# Patient Record
Sex: Male | Born: 1970 | Race: Black or African American | Hispanic: No | Marital: Single | Smoking: Never smoker
Health system: Southern US, Community
[De-identification: ages and names within clinical notes are randomized; demographics above are authoritative.]

## PROBLEM LIST (undated history)

## (undated) DIAGNOSIS — I719 Aortic aneurysm of unspecified site, without rupture: Secondary | ICD-10-CM

## (undated) DIAGNOSIS — Z952 Presence of prosthetic heart valve: Secondary | ICD-10-CM

## (undated) DIAGNOSIS — K219 Gastro-esophageal reflux disease without esophagitis: Secondary | ICD-10-CM

## (undated) DIAGNOSIS — I499 Cardiac arrhythmia, unspecified: Secondary | ICD-10-CM

## (undated) DIAGNOSIS — I509 Heart failure, unspecified: Secondary | ICD-10-CM

## (undated) DIAGNOSIS — G4730 Sleep apnea, unspecified: Secondary | ICD-10-CM

## (undated) DIAGNOSIS — K5792 Diverticulitis of intestine, part unspecified, without perforation or abscess without bleeding: Secondary | ICD-10-CM

## (undated) DIAGNOSIS — I10 Essential (primary) hypertension: Secondary | ICD-10-CM

## (undated) DIAGNOSIS — M1990 Unspecified osteoarthritis, unspecified site: Secondary | ICD-10-CM

## (undated) DIAGNOSIS — N189 Chronic kidney disease, unspecified: Secondary | ICD-10-CM

## (undated) DIAGNOSIS — Z87442 Personal history of urinary calculi: Secondary | ICD-10-CM

## (undated) DIAGNOSIS — F32A Depression, unspecified: Secondary | ICD-10-CM

## (undated) DIAGNOSIS — F419 Anxiety disorder, unspecified: Secondary | ICD-10-CM

## (undated) HISTORY — DX: Chronic kidney disease, unspecified: N18.9

## (undated) HISTORY — DX: Aortic aneurysm of unspecified site, without rupture: I71.9

## (undated) HISTORY — DX: Gastro-esophageal reflux disease without esophagitis: K21.9

## (undated) HISTORY — PX: FEMORAL BYPASS: SHX50

## (undated) HISTORY — DX: Heart failure, unspecified: I50.9

## (undated) HISTORY — PX: SPLENECTOMY, TOTAL: SHX788

---

## 1998-04-04 ENCOUNTER — Emergency Department (HOSPITAL_COMMUNITY): Admission: EM | Admit: 1998-04-04 | Discharge: 1998-04-04 | Payer: Self-pay | Admitting: Emergency Medicine

## 1998-04-19 ENCOUNTER — Inpatient Hospital Stay (HOSPITAL_COMMUNITY): Admission: EM | Admit: 1998-04-19 | Discharge: 1998-04-26 | Payer: Self-pay | Admitting: Emergency Medicine

## 1998-04-26 ENCOUNTER — Inpatient Hospital Stay (HOSPITAL_COMMUNITY)
Admission: RE | Admit: 1998-04-26 | Discharge: 1998-05-04 | Payer: Self-pay | Admitting: Physical Medicine & Rehabilitation

## 1998-06-20 ENCOUNTER — Encounter: Admission: RE | Admit: 1998-06-20 | Discharge: 1998-09-18 | Payer: Self-pay

## 1998-09-23 ENCOUNTER — Encounter: Admission: RE | Admit: 1998-09-23 | Discharge: 1998-11-18 | Payer: Self-pay

## 1998-11-12 ENCOUNTER — Emergency Department (HOSPITAL_COMMUNITY): Admission: EM | Admit: 1998-11-12 | Discharge: 1998-11-12 | Payer: Self-pay | Admitting: Emergency Medicine

## 1998-11-16 ENCOUNTER — Emergency Department (HOSPITAL_COMMUNITY): Admission: EM | Admit: 1998-11-16 | Discharge: 1998-11-16 | Payer: Self-pay | Admitting: Emergency Medicine

## 1999-02-04 ENCOUNTER — Encounter: Admission: RE | Admit: 1999-02-04 | Discharge: 1999-04-02 | Payer: Self-pay

## 1999-09-19 ENCOUNTER — Encounter: Admission: RE | Admit: 1999-09-19 | Discharge: 1999-09-19 | Payer: Self-pay | Admitting: Internal Medicine

## 1999-10-24 ENCOUNTER — Encounter: Admission: RE | Admit: 1999-10-24 | Discharge: 1999-10-24 | Payer: Self-pay | Admitting: Internal Medicine

## 2000-05-17 ENCOUNTER — Encounter: Admission: RE | Admit: 2000-05-17 | Discharge: 2000-05-17 | Payer: Self-pay | Admitting: Internal Medicine

## 2000-09-03 ENCOUNTER — Encounter: Admission: RE | Admit: 2000-09-03 | Discharge: 2000-09-03 | Payer: Self-pay | Admitting: Internal Medicine

## 2001-03-10 ENCOUNTER — Emergency Department (HOSPITAL_COMMUNITY): Admission: EM | Admit: 2001-03-10 | Discharge: 2001-03-10 | Payer: Self-pay | Admitting: Emergency Medicine

## 2001-08-08 ENCOUNTER — Encounter: Admission: RE | Admit: 2001-08-08 | Discharge: 2001-08-08 | Payer: Self-pay

## 2001-10-22 ENCOUNTER — Emergency Department (HOSPITAL_COMMUNITY): Admission: EM | Admit: 2001-10-22 | Discharge: 2001-10-23 | Payer: Self-pay | Admitting: Emergency Medicine

## 2001-10-23 ENCOUNTER — Encounter: Payer: Self-pay | Admitting: Emergency Medicine

## 2002-01-21 ENCOUNTER — Emergency Department (HOSPITAL_COMMUNITY): Admission: EM | Admit: 2002-01-21 | Discharge: 2002-01-21 | Payer: Self-pay | Admitting: Emergency Medicine

## 2002-08-16 ENCOUNTER — Encounter: Admission: RE | Admit: 2002-08-16 | Discharge: 2002-08-16 | Payer: Self-pay | Admitting: Internal Medicine

## 2002-09-11 ENCOUNTER — Encounter: Admission: RE | Admit: 2002-09-11 | Discharge: 2002-09-11 | Payer: Self-pay | Admitting: Internal Medicine

## 2003-02-05 ENCOUNTER — Encounter: Admission: RE | Admit: 2003-02-05 | Discharge: 2003-02-05 | Payer: Self-pay | Admitting: Internal Medicine

## 2003-02-13 ENCOUNTER — Encounter: Admission: RE | Admit: 2003-02-13 | Discharge: 2003-04-03 | Payer: Self-pay | Admitting: Infectious Diseases

## 2003-06-26 ENCOUNTER — Encounter: Admission: RE | Admit: 2003-06-26 | Discharge: 2003-06-26 | Payer: Self-pay | Admitting: Internal Medicine

## 2003-07-03 ENCOUNTER — Encounter: Admission: RE | Admit: 2003-07-03 | Discharge: 2003-07-03 | Payer: Self-pay | Admitting: Internal Medicine

## 2003-08-03 ENCOUNTER — Encounter
Admission: RE | Admit: 2003-08-03 | Discharge: 2003-11-01 | Payer: Self-pay | Admitting: Physical Medicine & Rehabilitation

## 2003-08-03 ENCOUNTER — Encounter: Admission: RE | Admit: 2003-08-03 | Discharge: 2003-08-03 | Payer: Self-pay | Admitting: Internal Medicine

## 2003-08-12 ENCOUNTER — Ambulatory Visit (HOSPITAL_COMMUNITY)
Admission: RE | Admit: 2003-08-12 | Discharge: 2003-08-12 | Payer: Self-pay | Admitting: Physical Medicine & Rehabilitation

## 2003-08-12 ENCOUNTER — Encounter: Payer: Self-pay | Admitting: Physical Medicine & Rehabilitation

## 2003-12-18 ENCOUNTER — Encounter
Admission: RE | Admit: 2003-12-18 | Discharge: 2004-03-17 | Payer: Self-pay | Admitting: Physical Medicine & Rehabilitation

## 2005-07-02 ENCOUNTER — Ambulatory Visit: Payer: Self-pay | Admitting: Internal Medicine

## 2007-11-21 ENCOUNTER — Emergency Department (HOSPITAL_COMMUNITY): Admission: EM | Admit: 2007-11-21 | Discharge: 2007-11-21 | Payer: Self-pay | Admitting: Family Medicine

## 2008-03-06 ENCOUNTER — Emergency Department (HOSPITAL_COMMUNITY): Admission: EM | Admit: 2008-03-06 | Discharge: 2008-03-06 | Payer: Self-pay | Admitting: Emergency Medicine
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
## 2011-07-10 LAB — CULTURE, ROUTINE-ABSCESS

## 2012-04-05 ENCOUNTER — Ambulatory Visit (HOSPITAL_COMMUNITY)
Admission: RE | Admit: 2012-04-05 | Discharge: 2012-04-05 | Disposition: A | Discharge status: Home / Self Care | Payer: Self-pay | Source: Ambulatory Visit | Attending: Urology | Admitting: Urology

## 2012-04-05 ENCOUNTER — Other Ambulatory Visit (HOSPITAL_COMMUNITY): Payer: Self-pay | Admitting: Urology

## 2012-04-05 DIAGNOSIS — N1330 Unspecified hydronephrosis: Secondary | ICD-10-CM

## 2012-04-05 DIAGNOSIS — N319 Neuromuscular dysfunction of bladder, unspecified: Secondary | ICD-10-CM | POA: Insufficient documentation

## 2012-10-28 ENCOUNTER — Emergency Department (HOSPITAL_COMMUNITY): Payer: No Typology Code available for payment source

## 2012-10-28 ENCOUNTER — Emergency Department (HOSPITAL_COMMUNITY)
Admission: EM | Admit: 2012-10-28 | Discharge: 2012-10-28 | Disposition: A | Discharge status: Home / Self Care | Payer: No Typology Code available for payment source | Attending: Emergency Medicine | Admitting: Emergency Medicine

## 2012-10-28 ENCOUNTER — Encounter (HOSPITAL_COMMUNITY): Payer: Self-pay | Admitting: *Deleted

## 2012-10-28 DIAGNOSIS — Y92410 Unspecified street and highway as the place of occurrence of the external cause: Secondary | ICD-10-CM | POA: Insufficient documentation

## 2012-10-28 DIAGNOSIS — Y9389 Activity, other specified: Secondary | ICD-10-CM | POA: Insufficient documentation

## 2012-10-28 DIAGNOSIS — S335XXA Sprain of ligaments of lumbar spine, initial encounter: Secondary | ICD-10-CM | POA: Insufficient documentation

## 2012-10-28 DIAGNOSIS — S46919A Strain of unspecified muscle, fascia and tendon at shoulder and upper arm level, unspecified arm, initial encounter: Secondary | ICD-10-CM

## 2012-10-28 DIAGNOSIS — S161XXA Strain of muscle, fascia and tendon at neck level, initial encounter: Secondary | ICD-10-CM

## 2012-10-28 DIAGNOSIS — IMO0002 Reserved for concepts with insufficient information to code with codable children: Secondary | ICD-10-CM | POA: Insufficient documentation

## 2012-10-28 DIAGNOSIS — Z9889 Other specified postprocedural states: Secondary | ICD-10-CM | POA: Insufficient documentation

## 2012-10-28 DIAGNOSIS — S139XXA Sprain of joints and ligaments of unspecified parts of neck, initial encounter: Secondary | ICD-10-CM | POA: Insufficient documentation

## 2012-10-28 DIAGNOSIS — S39012A Strain of muscle, fascia and tendon of lower back, initial encounter: Secondary | ICD-10-CM

## 2012-10-28 MED ORDER — HYDROCODONE-ACETAMINOPHEN 5-325 MG PO TABS: 1.0000 | ORAL_TABLET | Freq: Four times a day (QID) | ORAL | Status: DC | PRN

## 2012-10-28 MED ORDER — IBUPROFEN 800 MG PO TABS: 800.0000 mg | ORAL_TABLET | Freq: Three times a day (TID) | ORAL | Status: DC | PRN
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
## 2012-10-31 NOTE — ED Provider Notes (Signed)
Medical screening examination/treatment/procedure(s) were performed by non-physician practitioner and as supervising physician I was immediately available for consultation/collaboration.   Richardean Canal, MD 10/31/12 [phone]

## 2014-10-13 ENCOUNTER — Ambulatory Visit (INDEPENDENT_AMBULATORY_CARE_PROVIDER_SITE_OTHER): Payer: Self-pay | Admitting: Family Medicine

## 2014-10-13 VITALS — BP 126/88 | HR 102 | Temp 98.2°F | Resp 18

## 2014-10-13 DIAGNOSIS — R0789 Other chest pain: Secondary | ICD-10-CM

## 2014-10-13 DIAGNOSIS — R1013 Epigastric pain: Secondary | ICD-10-CM

## 2014-10-13 DIAGNOSIS — M21371 Foot drop, right foot: Secondary | ICD-10-CM | POA: Insufficient documentation

## 2014-10-13 MED ORDER — SUCRALFATE 1 GM/10ML PO SUSP: 1.0000 g | Freq: Three times a day (TID) | ORAL | Status: DC

## 2014-10-13 MED ORDER — GI COCKTAIL ~~LOC~~
30.0000 mL | Freq: Once | ORAL | Status: AC
  Administered 2014-10-13: 30 mL via ORAL

## 2014-10-13 MED ORDER — OMEPRAZOLE 40 MG PO CPDR: 40.0000 mg | DELAYED_RELEASE_CAPSULE | Freq: Every day | ORAL | Status: DC

## 2014-10-13 NOTE — Patient Instructions (Signed)
Things that often make reflux symptoms worse: Caffeine Carbonation (soda) Spicy foods Acidic foods (like tomato sauce, orange juice, lemonade) Fatty foods (including whole milk and ice cream) Stress (feeling sad, worried, nervous) Nicotine Alcohol  If you have not heard anything regarding the referral in 1 week, please contact our office.

## 2014-10-13 NOTE — Progress Notes (Signed)
Subjective:    Patient ID: Timothy Browning, male    DOB: 1971-02-14, 43 y.o.   MRN: [id number]   PCP: No primary care provider on file.  Chief Complaint  Patient presents with  . Chest Pain  . Shortness of Breath    No Known Allergies  There are no active problems to display for this patient.   Prior to Admission medications   Not on File    Medical, Surgical, Family and Social History reviewed and updated.  HPI  Presents with 5 days of central chest pressure and pain and intermittent shortness of breath.  "Constant" belching. Sometimes feels like something is sitting on him. Worse after eating, and at night is not able to lie down to sleep-is propping himself up on pillows.  Feels "at ease right now. I've been putting this off all week." His girlfriend says that he looks too uncomfortable and brought him in. Rates his pain now as a 5/10, with 0 being no pain, and 10 being the worst he can imagine.  At it's worst, he rates his pain "higher than 10."  Non-smoker. Drinks about a pint of liquor on weekends. No family history of heart disease. Know known personal history of heart disease, elevated lipids.  Had similar symptoms when he ruptured the spleen several years ago.  He has been experiencing " a lot of reflux" and taking an OTC antacid.  The Antacid resolves the "reflux" and indigestion, but not this pain/SOB.  Review of Systems As above. Chronic constipation. No melena or hematochezia. No urinary urgency, frequency or burning.     Objective:   Physical Exam  Constitutional: He is oriented to person, place, and time. Vital signs are normal. He appears well-developed and well-nourished. He is active and cooperative. No distress.  BP 126/88 mmHg  Pulse 102  Temp(Src) 98.2 F (36.8 C) (Oral)  Resp 18  SpO2 98%  HENT:  Head: Normocephalic and atraumatic.  Right Ear: Hearing normal.  Left Ear: Hearing normal.  Eyes: Conjunctivae are normal. No scleral  icterus.  Neck: Normal range of motion. Neck supple. No thyromegaly present.  Cardiovascular: Normal rate, regular rhythm and normal heart sounds.   Pulses:      Radial pulses are 2+ on the right side, and 2+ on the left side.  Pulmonary/Chest: Effort normal and breath sounds normal.  Abdominal: Soft. Normal appearance and bowel sounds are normal. There is no hepatosplenomegaly. There is tenderness in the epigastric area. There is no rigidity, no rebound, no guarding, no CVA tenderness, no tenderness at McBurney's point and negative Murphy's sign.    Lymphadenopathy:       Head (right side): No tonsillar, no preauricular, no posterior auricular and no occipital adenopathy present.       Head (left side): No tonsillar, no preauricular, no posterior auricular and no occipital adenopathy present.    He has no cervical adenopathy.       Right: No supraclavicular adenopathy present.       Left: No supraclavicular adenopathy present.  Neurological: He is alert and oriented to person, place, and time. No sensory deficit.  Skin: Skin is warm, dry and intact. No rash noted. No cyanosis or erythema. Nails show no clubbing.  Psychiatric: He has a normal mood and affect. His speech is normal and behavior is normal. He does not express impulsivity or inappropriate judgment.   EKG reviewed with Dr. Katrinka BlazingSmith. NSR. No evidence of ischemia or LVH.  GI Cocktail administered with  no relief, "I just keep belching."     Assessment & Plan:  1. Other chest pain Suspect this is reflux.  - EKG 12-Lead  2. Dyspepsia No benefit with GI cocktail. Carafate and PPI. Refer to GI. Likely needs upper endoscopy.  - gi cocktail (Maalox,Lidocaine,Donnatal); Take 30 mLs by mouth once. - sucralfate (CARAFATE) 1 GM/10ML suspension; Take 10 mLs (1 g total) by mouth 4 (four) times daily -  with meals and at bedtime.  Dispense: 420 mL; Refill: 0 - omeprazole (PRILOSEC) 40 MG capsule; Take 1 capsule (40 mg total) by mouth daily.   Dispense: 30 capsule; Refill: 3 - Ambulatory referral to Gastroenterology  Schedule routine health maintenance at his convenience.  Fernande Brashelle S. Oline Belk, PA-C Physician Assistant-Certified [facility]

## 2014-10-16 ENCOUNTER — Encounter: Payer: Self-pay | Admitting: Internal Medicine

## 2014-10-16 NOTE — Progress Notes (Signed)
History and physical examinations reviewed in detail with Porfirio Oarhelle Jeffery, PA-C.  EKG reviewed and WNL.  Agree with assessment and plan.

## 2014-11-29 ENCOUNTER — Encounter: Payer: Self-pay | Admitting: Internal Medicine

## 2014-11-29 ENCOUNTER — Ambulatory Visit (INDEPENDENT_AMBULATORY_CARE_PROVIDER_SITE_OTHER): Payer: Self-pay | Admitting: Internal Medicine

## 2014-11-29 VITALS — BP 120/84 | HR 64 | Ht 72.0 in | Wt 232.8 lb

## 2014-11-29 DIAGNOSIS — R1314 Dysphagia, pharyngoesophageal phase: Secondary | ICD-10-CM

## 2014-11-29 DIAGNOSIS — K5900 Constipation, unspecified: Secondary | ICD-10-CM

## 2014-11-29 DIAGNOSIS — K219 Gastro-esophageal reflux disease without esophagitis: Secondary | ICD-10-CM

## 2014-11-29 DIAGNOSIS — R0789 Other chest pain: Secondary | ICD-10-CM

## 2014-11-29 MED ORDER — OMEPRAZOLE MAGNESIUM 20 MG PO TBEC: 20.0000 mg | DELAYED_RELEASE_TABLET | Freq: Every day | ORAL | Status: DC

## 2014-11-29 MED ORDER — POLYETHYLENE GLYCOL 3350 17 GM/SCOOP PO POWD: 1.0000 | Freq: Every day | ORAL | Status: DC
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
## 2014-11-29 NOTE — Patient Instructions (Signed)
You may take over the counter Prilosec and Glycolax.  Please follow up with Dr. Marina GoodellPerry on 02/27/2015 at 10:45am

## 2015-02-27 ENCOUNTER — Ambulatory Visit: Payer: Self-pay | Admitting: Internal Medicine

## 2016-12-29 ENCOUNTER — Emergency Department (HOSPITAL_COMMUNITY)
Admission: EM | Admit: 2016-12-29 | Discharge: 2016-12-29 | Disposition: A | Discharge status: Home / Self Care | Payer: Self-pay | Attending: Emergency Medicine | Admitting: Emergency Medicine

## 2016-12-29 ENCOUNTER — Emergency Department (HOSPITAL_COMMUNITY): Payer: Self-pay

## 2016-12-29 ENCOUNTER — Encounter (HOSPITAL_COMMUNITY): Payer: Self-pay | Admitting: Emergency Medicine

## 2016-12-29 DIAGNOSIS — Z79899 Other long term (current) drug therapy: Secondary | ICD-10-CM | POA: Insufficient documentation

## 2016-12-29 DIAGNOSIS — K209 Esophagitis, unspecified without bleeding: Secondary | ICD-10-CM

## 2016-12-29 DIAGNOSIS — K8000 Calculus of gallbladder with acute cholecystitis without obstruction: Secondary | ICD-10-CM | POA: Insufficient documentation

## 2016-12-29 DIAGNOSIS — K219 Gastro-esophageal reflux disease without esophagitis: Secondary | ICD-10-CM | POA: Insufficient documentation

## 2016-12-29 DIAGNOSIS — K8020 Calculus of gallbladder without cholecystitis without obstruction: Principal | ICD-10-CM | POA: Insufficient documentation

## 2016-12-29 DIAGNOSIS — Z9081 Acquired absence of spleen: Secondary | ICD-10-CM | POA: Insufficient documentation

## 2016-12-29 DIAGNOSIS — Z95820 Peripheral vascular angioplasty status with implants and grafts: Secondary | ICD-10-CM | POA: Insufficient documentation

## 2016-12-29 LAB — CBC
HCT: 42.8 % (ref 39.0–52.0)
HEMOGLOBIN: 14.6 g/dL (ref 13.0–17.0)
MCH: 30.5 pg (ref 26.0–34.0)
MCHC: 34.1 g/dL (ref 30.0–36.0)
MCV: 89.5 fL (ref 78.0–100.0)
PLATELETS: 338 10*3/uL (ref 150–400)
RBC: 4.78 MIL/uL (ref 4.22–5.81)
RDW: 13.5 % (ref 11.5–15.5)
WBC: 6.9 10*3/uL (ref 4.0–10.5)

## 2016-12-29 LAB — COMPREHENSIVE METABOLIC PANEL
ALBUMIN: 4.3 g/dL (ref 3.5–5.0)
ALT: 27 U/L (ref 17–63)
ANION GAP: 7 (ref 5–15)
AST: 26 U/L (ref 15–41)
Alkaline Phosphatase: 63 U/L (ref 38–126)
BILIRUBIN TOTAL: 0.4 mg/dL (ref 0.3–1.2)
BUN: 13 mg/dL (ref 6–20)
CHLORIDE: 103 mmol/L (ref 101–111)
CO2: 27 mmol/L (ref 22–32)
Calcium: 9.1 mg/dL (ref 8.9–10.3)
Creatinine, Ser: 1.27 mg/dL — ABNORMAL HIGH (ref 0.61–1.24)
GFR calc Af Amer: >60 mL/min (ref >60)
GFR calc non Af Amer: >60 mL/min (ref >60)
GLUCOSE: 161 mg/dL — AB (ref 65–99)
POTASSIUM: 3.5 mmol/L (ref 3.5–5.1)
SODIUM: 137 mmol/L (ref 135–145)
TOTAL PROTEIN: 7.8 g/dL (ref 6.5–8.1)

## 2016-12-29 LAB — I-STAT TROPONIN, ED
TROPONIN I, POC: 0 ng/mL (ref 0.00–0.08)
Troponin i, poc: 0 ng/mL (ref 0.00–0.08)

## 2016-12-29 LAB — LIPASE, BLOOD: Lipase: 22 U/L (ref 11–51)

## 2016-12-29 MED ORDER — SUCRALFATE 1 G PO TABS: 1.0000 g | ORAL_TABLET | Freq: Three times a day (TID) | ORAL | 0 refills | Status: DC

## 2016-12-29 MED ORDER — MORPHINE SULFATE (PF) 4 MG/ML IV SOLN
4.0000 mg | Freq: Once | INTRAVENOUS | Status: AC
  Administered 2016-12-29: 4 mg via INTRAVENOUS
  Filled 2016-12-29: qty 1

## 2016-12-29 MED ORDER — LANSOPRAZOLE 30 MG PO CPDR: 30.0000 mg | DELAYED_RELEASE_CAPSULE | Freq: Every day | ORAL | 1 refills | Status: DC

## 2016-12-29 MED ORDER — PANTOPRAZOLE SODIUM 40 MG IV SOLR
40.0000 mg | Freq: Once | INTRAVENOUS | Status: AC
  Administered 2016-12-29: 40 mg via INTRAVENOUS
  Filled 2016-12-29: qty 40

## 2016-12-29 MED ORDER — GI COCKTAIL ~~LOC~~
30.0000 mL | Freq: Once | ORAL | Status: AC
  Administered 2016-12-29: 30 mL via ORAL
  Filled 2016-12-29: qty 30

## 2016-12-29 MED ORDER — HYDROCODONE-ACETAMINOPHEN 5-325 MG PO TABS: 1.0000 | ORAL_TABLET | ORAL | 0 refills | Status: DC | PRN
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
## 2016-12-29 NOTE — ED Notes (Signed)
ED Provider at bedside. 

## 2016-12-29 NOTE — ED Notes (Signed)
Signature pad not working. Pt verbalizes understanding of d/c instructions 
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
## 2016-12-29 NOTE — ED Triage Notes (Signed)
Pt states that he was here earlier and dx with acid reflux issues but still feels R sided/central epigastric and chest pain. Alert and oriented.

## 2016-12-29 NOTE — ED Triage Notes (Signed)
Per pt, states 10/10 chest pain since 4 am-states tightness and SOB

## 2016-12-29 NOTE — ED Notes (Signed)
Ultrasound at bedside

## 2016-12-29 NOTE — Discharge Instructions (Signed)
Please follow up with the gastroenterologist to further evaluate your symptoms and possible endoscopy.  Please return for fever or as needed for any worsening symptoms

## 2016-12-30 ENCOUNTER — Observation Stay (HOSPITAL_COMMUNITY): Payer: Self-pay | Admitting: Registered Nurse

## 2016-12-30 ENCOUNTER — Emergency Department (HOSPITAL_COMMUNITY): Payer: Self-pay

## 2016-12-30 ENCOUNTER — Encounter (HOSPITAL_COMMUNITY): Admission: EM | Disposition: A | Discharge status: Home / Self Care | Payer: Self-pay | Source: Home / Self Care

## 2016-12-30 ENCOUNTER — Encounter (HOSPITAL_COMMUNITY): Payer: Self-pay

## 2016-12-30 ENCOUNTER — Observation Stay (HOSPITAL_COMMUNITY)
Admission: EM | Admit: 2016-12-30 | Discharge: 2016-12-31 | Disposition: A | Discharge status: Home / Self Care | Payer: Self-pay

## 2016-12-30 ENCOUNTER — Observation Stay (HOSPITAL_COMMUNITY): Payer: Self-pay

## 2016-12-30 DIAGNOSIS — R1011 Right upper quadrant pain: Secondary | ICD-10-CM

## 2016-12-30 DIAGNOSIS — K810 Acute cholecystitis: Secondary | ICD-10-CM | POA: Diagnosis present

## 2016-12-30 HISTORY — PX: CHOLECYSTECTOMY: SHX55

## 2016-12-30 HISTORY — DX: Acute cholecystitis: K81.0

## 2016-12-30 LAB — CBC WITH DIFFERENTIAL/PLATELET
BASOS PCT: 0 %
Basophils Absolute: 0 10*3/uL (ref 0.0–0.1)
EOS PCT: 0 %
Eosinophils Absolute: 0 10*3/uL (ref 0.0–0.7)
HEMATOCRIT: 43.7 % (ref 39.0–52.0)
HEMOGLOBIN: 15.1 g/dL (ref 13.0–17.0)
LYMPHS PCT: 10 %
Lymphs Abs: 1.4 10*3/uL (ref 0.7–4.0)
MCH: 31.3 pg (ref 26.0–34.0)
MCHC: 34.6 g/dL (ref 30.0–36.0)
MCV: 90.7 fL (ref 78.0–100.0)
MONOS PCT: 6 %
Monocytes Absolute: 0.8 10*3/uL (ref 0.1–1.0)
NEUTROS ABS: 11.8 10*3/uL — AB (ref 1.7–7.7)
NEUTROS PCT: 84 %
Platelets: 316 10*3/uL (ref 150–400)
RBC: 4.82 MIL/uL (ref 4.22–5.81)
RDW: 13.6 % (ref 11.5–15.5)
WBC: 14 10*3/uL — ABNORMAL HIGH (ref 4.0–10.5)

## 2016-12-30 LAB — COMPREHENSIVE METABOLIC PANEL
ALK PHOS: 52 U/L (ref 38–126)
ALT: 25 U/L (ref 17–63)
AST: 26 U/L (ref 15–41)
Albumin: 4 g/dL (ref 3.5–5.0)
Anion gap: 9 (ref 5–15)
BUN: 12 mg/dL (ref 6–20)
CALCIUM: 9.1 mg/dL (ref 8.9–10.3)
CHLORIDE: 101 mmol/L (ref 101–111)
CO2: 24 mmol/L (ref 22–32)
CREATININE: 1.29 mg/dL — AB (ref 0.61–1.24)
Glucose, Bld: 131 mg/dL — ABNORMAL HIGH (ref 65–99)
Potassium: 4 mmol/L (ref 3.5–5.1)
Sodium: 134 mmol/L — ABNORMAL LOW (ref 135–145)
Total Bilirubin: 0.8 mg/dL (ref 0.3–1.2)
Total Protein: 7.7 g/dL (ref 6.5–8.1)

## 2016-12-30 LAB — LIPASE, BLOOD: LIPASE: 18 U/L (ref 11–51)

## 2016-12-30 LAB — TROPONIN I

## 2016-12-30 SURGERY — LAPAROSCOPIC CHOLECYSTECTOMY WITH INTRAOPERATIVE CHOLANGIOGRAM
Anesthesia: General | Site: Abdomen

## 2016-12-30 MED ORDER — LIDOCAINE 2% (20 MG/ML) 5 ML SYRINGE
INTRAMUSCULAR | Status: DC | PRN
  Administered 2016-12-30: 100 mg via INTRAVENOUS

## 2016-12-30 MED ORDER — CHLORHEXIDINE GLUCONATE CLOTH 2 % EX PADS: 6.0000 | MEDICATED_PAD | Freq: Once | CUTANEOUS | Status: DC

## 2016-12-30 MED ORDER — DEXAMETHASONE SODIUM PHOSPHATE 10 MG/ML IJ SOLN
INTRAMUSCULAR | Status: AC
  Filled 2016-12-30: qty 1

## 2016-12-30 MED ORDER — ONDANSETRON HCL 4 MG/2ML IJ SOLN
INTRAMUSCULAR | Status: AC
  Filled 2016-12-30: qty 2

## 2016-12-30 MED ORDER — MIDAZOLAM HCL 5 MG/5ML IJ SOLN
INTRAMUSCULAR | Status: DC | PRN
Start: 2016-12-30 — End: 2016-12-30
  Administered 2016-12-30: 2 mg via INTRAVENOUS

## 2016-12-30 MED ORDER — PROPOFOL 10 MG/ML IV BOLUS
INTRAVENOUS | Status: DC | PRN
  Administered 2016-12-30: 200 mg via INTRAVENOUS

## 2016-12-30 MED ORDER — FENTANYL CITRATE (PF) 100 MCG/2ML IJ SOLN
100.0000 ug | Freq: Once | INTRAMUSCULAR | Status: AC
  Administered 2016-12-30: 100 ug via INTRAVENOUS
  Filled 2016-12-30: qty 2

## 2016-12-30 MED ORDER — ROCURONIUM BROMIDE 50 MG/5ML IV SOSY
PREFILLED_SYRINGE | INTRAVENOUS | Status: AC
  Filled 2016-12-30: qty 5

## 2016-12-30 MED ORDER — FENTANYL CITRATE (PF) 250 MCG/5ML IJ SOLN
INTRAMUSCULAR | Status: AC
  Filled 2016-12-30: qty 5

## 2016-12-30 MED ORDER — MIDAZOLAM HCL 2 MG/2ML IJ SOLN
INTRAMUSCULAR | Status: AC
  Filled 2016-12-30: qty 2

## 2016-12-30 MED ORDER — BUPIVACAINE-EPINEPHRINE 0.5% -1:200000 IJ SOLN
INTRAMUSCULAR | Status: DC | PRN
  Administered 2016-12-30: 20 mL

## 2016-12-30 MED ORDER — FENTANYL CITRATE (PF) 100 MCG/2ML IJ SOLN
100.0000 ug | INTRAMUSCULAR | Status: DC | PRN
  Administered 2016-12-30: 100 ug via INTRAVENOUS
  Filled 2016-12-30 (×2): qty 2

## 2016-12-30 MED ORDER — ONDANSETRON HCL 4 MG/2ML IJ SOLN
4.0000 mg | Freq: Three times a day (TID) | INTRAMUSCULAR | Status: DC | PRN
  Administered 2016-12-30: 4 mg via INTRAVENOUS
  Filled 2016-12-30: qty 2

## 2016-12-30 MED ORDER — 0.9 % SODIUM CHLORIDE (POUR BTL) OPTIME
TOPICAL | Status: DC | PRN
  Administered 2016-12-30: 1000 mL

## 2016-12-30 MED ORDER — DEXAMETHASONE SODIUM PHOSPHATE 10 MG/ML IJ SOLN
INTRAMUSCULAR | Status: DC | PRN
  Administered 2016-12-30: 10 mg via INTRAVENOUS

## 2016-12-30 MED ORDER — PROMETHAZINE HCL 25 MG/ML IJ SOLN: 6.2500 mg | INTRAMUSCULAR | Status: DC | PRN

## 2016-12-30 MED ORDER — FENTANYL CITRATE (PF) 100 MCG/2ML IJ SOLN
INTRAMUSCULAR | Status: DC | PRN
  Administered 2016-12-30: 50 ug via INTRAVENOUS
  Administered 2016-12-30: 100 ug via INTRAVENOUS
  Administered 2016-12-30 (×2): 50 ug via INTRAVENOUS

## 2016-12-30 MED ORDER — MEPERIDINE HCL 50 MG/ML IJ SOLN: 6.2500 mg | INTRAMUSCULAR | Status: DC | PRN

## 2016-12-30 MED ORDER — ONDANSETRON 4 MG PO TBDP: 4.0000 mg | ORAL_TABLET | Freq: Four times a day (QID) | ORAL | Status: DC | PRN

## 2016-12-30 MED ORDER — PROPOFOL 10 MG/ML IV BOLUS
INTRAVENOUS | Status: AC
  Filled 2016-12-30: qty 20

## 2016-12-30 MED ORDER — ROCURONIUM BROMIDE 10 MG/ML (PF) SYRINGE
PREFILLED_SYRINGE | INTRAVENOUS | Status: DC | PRN
  Administered 2016-12-30: 50 mg via INTRAVENOUS
  Administered 2016-12-30 (×3): 10 mg via INTRAVENOUS

## 2016-12-30 MED ORDER — ACETAMINOPHEN 325 MG PO TABS: 650.0000 mg | ORAL_TABLET | Freq: Four times a day (QID) | ORAL | Status: DC | PRN

## 2016-12-30 MED ORDER — HYDROCODONE-ACETAMINOPHEN 5-325 MG PO TABS
1.0000 | ORAL_TABLET | ORAL | Status: DC | PRN
  Administered 2016-12-31 (×2): 1 via ORAL
  Filled 2016-12-30 (×2): qty 1

## 2016-12-30 MED ORDER — BUPIVACAINE-EPINEPHRINE (PF) 0.5% -1:200000 IJ SOLN
INTRAMUSCULAR | Status: AC
  Filled 2016-12-30: qty 30

## 2016-12-30 MED ORDER — SODIUM CHLORIDE 0.9 % IV SOLN
INTRAVENOUS | Status: AC
  Administered 2016-12-30 (×2): via INTRAVENOUS

## 2016-12-30 MED ORDER — SUGAMMADEX SODIUM 200 MG/2ML IV SOLN
INTRAVENOUS | Status: AC
  Filled 2016-12-30: qty 2

## 2016-12-30 MED ORDER — DEXTROSE 5 % IV SOLN
2.0000 g | Freq: Once | INTRAVENOUS | Status: AC
  Administered 2016-12-30: 2 g via INTRAVENOUS
  Filled 2016-12-30: qty 2

## 2016-12-30 MED ORDER — SUCCINYLCHOLINE CHLORIDE 200 MG/10ML IV SOSY
PREFILLED_SYRINGE | INTRAVENOUS | Status: AC
  Filled 2016-12-30: qty 10

## 2016-12-30 MED ORDER — HYDROMORPHONE HCL 1 MG/ML IJ SOLN: 0.2500 mg | INTRAMUSCULAR | Status: DC | PRN

## 2016-12-30 MED ORDER — ONDANSETRON HCL 4 MG/2ML IJ SOLN: 4.0000 mg | Freq: Four times a day (QID) | INTRAMUSCULAR | Status: DC | PRN

## 2016-12-30 MED ORDER — ACETAMINOPHEN 650 MG RE SUPP: 650.0000 mg | Freq: Four times a day (QID) | RECTAL | Status: DC | PRN

## 2016-12-30 MED ORDER — ONDANSETRON HCL 4 MG/2ML IJ SOLN
INTRAMUSCULAR | Status: DC | PRN
  Administered 2016-12-30: 4 mg via INTRAVENOUS

## 2016-12-30 MED ORDER — IOPAMIDOL (ISOVUE-300) INJECTION 61%
INTRAVENOUS | Status: AC
  Filled 2016-12-30: qty 50

## 2016-12-30 MED ORDER — LACTATED RINGERS IV SOLN
Freq: Once | INTRAVENOUS | Status: AC
  Administered 2016-12-30: 17:00:00 via INTRAVENOUS

## 2016-12-30 MED ORDER — IOPAMIDOL (ISOVUE-300) INJECTION 61%
INTRAVENOUS | Status: DC | PRN
  Administered 2016-12-30: 5 mL

## 2016-12-30 MED ORDER — LIDOCAINE 2% (20 MG/ML) 5 ML SYRINGE
INTRAMUSCULAR | Status: AC
  Filled 2016-12-30: qty 5

## 2016-12-30 MED ORDER — SODIUM CHLORIDE 0.9 % IV SOLN
Freq: Once | INTRAVENOUS | Status: AC
  Administered 2016-12-30: 01:00:00 via INTRAVENOUS

## 2016-12-30 MED ORDER — HYDROMORPHONE HCL 1 MG/ML IJ SOLN
1.0000 mg | INTRAMUSCULAR | Status: DC | PRN
  Administered 2016-12-30 – 2016-12-31 (×2): 1 mg via INTRAVENOUS
  Filled 2016-12-30 (×2): qty 1

## 2016-12-30 MED ORDER — ONDANSETRON HCL 4 MG/2ML IJ SOLN
4.0000 mg | Freq: Once | INTRAMUSCULAR | Status: AC
  Administered 2016-12-30: 4 mg via INTRAVENOUS
  Filled 2016-12-30: qty 2

## 2016-12-30 MED ORDER — DEXTROSE 5 % IV SOLN
2.0000 g | INTRAVENOUS | Status: DC
  Administered 2016-12-31: 2 g via INTRAVENOUS
  Filled 2016-12-30: qty 2

## 2016-12-30 MED ORDER — LACTATED RINGERS IR SOLN
Status: DC | PRN
  Administered 2016-12-30: 1000 mL

## 2016-12-30 MED ORDER — SUGAMMADEX SODIUM 200 MG/2ML IV SOLN
INTRAVENOUS | Status: DC | PRN
  Administered 2016-12-30: 200 mg via INTRAVENOUS

## 2016-12-30 MED ORDER — KCL IN DEXTROSE-NACL 20-5-0.45 MEQ/L-%-% IV SOLN
INTRAVENOUS | Status: DC
  Administered 2016-12-30: 18:00:00 via INTRAVENOUS
  Filled 2016-12-30 (×2): qty 1000

## 2016-12-30 SURGICAL SUPPLY — 35 items
APPLIER CLIP ROT 10 11.4 M/L (STAPLE) ×3
APR CLP MED LRG 11.4X10 (STAPLE) ×1
BAG SPEC RTRVL LRG 6X4 10 (ENDOMECHANICALS) ×1
CABLE HIGH FREQUENCY MONO STRZ (ELECTRODE) ×3 IMPLANT
CHLORAPREP W/TINT 26ML (MISCELLANEOUS) ×4 IMPLANT
CLIP APPLIE ROT 10 11.4 M/L (STAPLE) ×1 IMPLANT
CLOSURE WOUND 1/2 X4 (GAUZE/BANDAGES/DRESSINGS) ×1
COVER MAYO STAND STRL (DRAPES) ×3 IMPLANT
COVER SURGICAL LIGHT HANDLE (MISCELLANEOUS) ×1 IMPLANT
DECANTER SPIKE VIAL GLASS SM (MISCELLANEOUS) ×3 IMPLANT
DRAPE C-ARM 42X120 X-RAY (DRAPES) ×3 IMPLANT
ELECT REM PT RETURN 9FT ADLT (ELECTROSURGICAL) ×3
ELECTRODE REM PT RTRN 9FT ADLT (ELECTROSURGICAL) ×1 IMPLANT
GAUZE SPONGE 2X2 8PLY STRL LF (GAUZE/BANDAGES/DRESSINGS) ×1 IMPLANT
GLOVE SURG ORTHO 8.0 STRL STRW (GLOVE) ×3 IMPLANT
GOWN STRL REUS W/TWL XL LVL3 (GOWN DISPOSABLE) ×6 IMPLANT
HEMOSTAT SURGICEL 4X8 (HEMOSTASIS) IMPLANT
IRRIG SUCT STRYKERFLOW 2 WTIP (MISCELLANEOUS)
IRRIGATION SUCT STRKRFLW 2 WTP (MISCELLANEOUS) ×1 IMPLANT
KIT BASIN OR (CUSTOM PROCEDURE TRAY) ×3 IMPLANT
POUCH SPECIMEN RETRIEVAL 10MM (ENDOMECHANICALS) ×3 IMPLANT
SCISSORS LAP 5X35 DISP (ENDOMECHANICALS) ×3 IMPLANT
SET CHOLANGIOGRAPH MIX (MISCELLANEOUS) ×3 IMPLANT
SET IRRIG TUBING LAPAROSCOPIC (IRRIGATION / IRRIGATOR) ×2 IMPLANT
SLEEVE XCEL OPT CAN 5 100 (ENDOMECHANICALS) ×3 IMPLANT
SPONGE GAUZE 2X2 STER 10/PKG (GAUZE/BANDAGES/DRESSINGS) ×2
STRIP CLOSURE SKIN 1/2X4 (GAUZE/BANDAGES/DRESSINGS) ×2 IMPLANT
SUT MNCRL AB 4-0 PS2 18 (SUTURE) ×3 IMPLANT
TOWEL OR 17X26 10 PK STRL BLUE (TOWEL DISPOSABLE) ×3 IMPLANT
TOWEL OR NON WOVEN STRL DISP B (DISPOSABLE) ×3 IMPLANT
TRAY LAPAROSCOPIC (CUSTOM PROCEDURE TRAY) ×3 IMPLANT
TROCAR BLADELESS OPT 5 100 (ENDOMECHANICALS) ×3 IMPLANT
TROCAR XCEL BLUNT TIP 100MML (ENDOMECHANICALS) ×3 IMPLANT
TROCAR XCEL NON-BLD 11X100MML (ENDOMECHANICALS) ×3 IMPLANT
TUBING INSUF HEATED (TUBING) IMPLANT
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
## 2016-12-30 NOTE — Progress Notes (Signed)
Received report from [place], ED RN/ [facility]. Patient C/O R upper quadrant pain and was diagnosedwith acute cholecystitis

## 2016-12-30 NOTE — Anesthesia Procedure Notes (Signed)
Procedure Name: Intubation Date/Time: 12/30/2016 2:35 PM Performed by: Noralyn Pick D Pre-anesthesia Checklist: Patient identified, Emergency Drugs available, Suction available and Patient being monitored Patient Re-evaluated:Patient Re-evaluated prior to inductionOxygen Delivery Method: Circle system utilized Preoxygenation: Pre-oxygenation with 100% oxygen Intubation Type: IV induction Ventilation: Mask ventilation without difficulty Laryngoscope Size: Mac and 4 Grade View: Grade III Tube type: Oral Number of attempts: 1 Airway Equipment and Method: Stylet Placement Confirmation: ETT inserted through vocal cords under direct vision,  positive ETCO2 and breath sounds checked- equal and bilateral Secured at: 22 cm Tube secured with: Tape Dental Injury: Teeth and Oropharynx as per pre-operative assessment
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
## 2016-12-30 NOTE — Anesthesia Preprocedure Evaluation (Addendum)
Anesthesia Evaluation  Patient identified by MRN, date of birth, ID band Patient awake    Reviewed: Allergy & Precautions, NPO status , Patient's Chart, lab work & pertinent test results  Airway Mallampati: II  TM Distance: >3 FB Neck ROM: Full    Dental no notable dental hx.    Pulmonary neg pulmonary ROS,    Pulmonary exam normal breath sounds clear to auscultation       Cardiovascular negative cardio ROS Normal cardiovascular exam Rhythm:Regular Rate:Normal     Neuro/Psych negative neurological ROS  negative psych ROS   GI/Hepatic negative GI ROS, Neg liver ROS,   Endo/Other  negative endocrine ROS  Renal/GU negative Renal ROS     Musculoskeletal negative musculoskeletal ROS (+)   Abdominal (+) + obese,   Peds  Hematology negative hematology ROS (+)   Anesthesia Other Findings   Reproductive/Obstetrics negative OB ROS                           Anesthesia Physical Anesthesia Plan  ASA: II  Anesthesia Plan: General   Post-op Pain Management:    Induction: Intravenous  Airway Management Planned: Oral ETT  Additional Equipment:   Intra-op Plan:   Post-operative Plan: Extubation in OR  Informed Consent: I have reviewed the patients History and Physical, chart, labs and discussed the procedure including the risks, benefits and alternatives for the proposed anesthesia with the patient or authorized representative who has indicated his/her understanding and acceptance.   Dental advisory given  Plan Discussed with: CRNA  Anesthesia Plan Comments:         Anesthesia Quick Evaluation  

## 2016-12-30 NOTE — Discharge Instructions (Addendum)
Please take your antibiotics (Augmentin) to completion.  Phone number to financial services: [phone]  LAPAROSCOPIC SURGERY: POST OP INSTRUCTIONS  1. DIET: Follow a light bland diet the first 24 hours after arrival home, such as soup, liquids, crackers, etc. Be sure to include lots of fluids daily. Avoid fast food or heavy meals as your are more likely to get nauseated. Eat a low fat the next few days after surgery.  2. Take your usually prescribed home medications unless otherwise directed. 3. PAIN CONTROL:  1. Pain is best controlled by a usual combination of three different methods TOGETHER:  1. Ice/Heat 2. Over the counter pain medication 3. Prescription pain medication 2. Most patients will experience some swelling and bruising around the incisions. Ice packs or heating pads (30-60 minutes up to 6 times a day) will help. Use ice for the first few days to help decrease swelling and bruising, then switch to heat to help relax tight/sore spots and speed recovery. Some people prefer to use ice alone, heat alone, alternating between ice & heat. Experiment to what works for you. Swelling and bruising can take several weeks to resolve.  3. It is helpful to take an over-the-counter pain medication regularly for the first few weeks. Choose one of the following that works best for you:  1. Naproxen (Aleve, etc) Two 220mg  tabs twice a day 2. Ibuprofen (Advil, etc) Three 200mg  tabs four times a day (every meal & bedtime) 3. Acetaminophen (Tylenol, etc) 500-650mg  four times a day (every meal & bedtime) 4. A prescription for pain medication (such as oxycodone, hydrocodone, etc) should be given to you upon discharge. Take your pain medication as prescribed.  1. If you are having problems/concerns with the prescription medicine (does not control pain, nausea, vomiting, rash, itching, etc), please call us [phone] to see if we need to switch you to a different pain medicine that will work better for  you and/or control your side effect better. 2. If you need a refill on your pain medication, please contact your pharmacy. They will contact our office to request authorization. Prescriptions will not be filled after 5 pm or on week-ends. 4. Avoid getting constipated. Between the surgery and the pain medications, it is common to experience some constipation. Increasing fluid intake and taking a fiber supplement (such as Metamucil, Citrucel, FiberCon, MiraLax, etc) 1-2 times a day regularly will usually help prevent this problem from occurring. A mild laxative (prune juice, Milk of Magnesia, MiraLax, etc) should be taken according to package directions if there are no bowel movements after 48 hours.  5. Watch out for diarrhea. If you have many loose bowel movements, simplify your diet to bland foods & liquids for a few days. Stop any stool softeners and decrease your fiber supplement. Switching to mild anti-diarrheal medications (Kayopectate, Pepto Bismol) can help. If this worsens or does not improve, please call us. 6. Wash / shower every day. You may shower over the dressings as they are waterproof. Continue to shower over incision(s) after the dressing is off. If there is glue over the incisions try not to pick it off, let it fall off naturally. 7. Remove your waterproof bandages tomorrow. You may leave the incision open to air. You may replace a dressing/Band-Aid to cover the incision for comfort if you wish.  8. ACTIVITIES as tolerated:  1. You may resume regular (light) daily activities beginning the next day--such as daily self-care, walking, climbing stairs--gradually increasing activities as tolerated. If you can walk  30 minutes without difficulty, it is safe to try more intense activity such as jogging, treadmill, bicycling, low-impact aerobics, swimming, etc. 2. Save the most intensive and strenuous activity for last such as sit-ups, heavy lifting, contact sports, etc Refrain from any heavy  lifting or straining until you are off narcotics for pain control. For the first 2-3 weeks do not lift over 10-15lb.  3. DO NOT PUSH THROUGH PAIN. Let pain be your guide: If it hurts to do something, don't do it. Pain is your body warning you to avoid that activity for another week until the pain goes down. 4. You may drive when you are no longer taking prescription pain medication, you can comfortably wear a seatbelt, and you can safely maneuver your car and apply brakes. 5. You may have sexual intercourse when it is comfortable.  9. FOLLOW UP in our office  1. Please call [facility] at [phone] to set up an appointment to see your surgeon in the office for a follow-up appointment approximately 2-3 weeks after your surgery. 2. Make sure that you call for this appointment the day you arrive home to insure a convenient appointment time.      10. IF YOU HAVE DISABILITY OR FAMILY LEAVE FORMS, BRING THEM TO THE               OFFICE FOR PROCESSING.   WHEN TO CALL US [phone]:  1. Poor pain control 2. Reactions / problems with new medications (rash/itching, nausea, etc)  3. Fever over 101.5 F (38.5 C) 4. Inability to urinate 5. Nausea and/or vomiting 6. Worsening swelling or bruising 7. Continued bleeding from incision. 8. Increased pain, redness, or drainage from the incision  The clinic staff is available to answer your questions during regular business hours (8:30am-5pm). Please dont hesitate to call and ask to speak to one of our nurses for clinical concerns.  If you have a medical emergency, go to the nearest emergency room or call 911.  A surgeon from [facility] is always on call at the [facility]   [facility] [place]  [address], [address], [place], [place] [address] ?  MAIN: [phone] ? TOLL FREE: [phone] ?  FAX [phone]  [email or web address]

## 2016-12-30 NOTE — Transfer of Care (Signed)
Immediate Anesthesia Transfer of Care Note  Patient: Stefano GaulMario L Bonnin  Procedure(s) Performed: Procedure(s): LAPAROSCOPIC CHOLECYSTECTOMY WITH INTRAOPERATIVE CHOLANGIOGRAM (N/A)  Patient Location: PACU  Anesthesia Type:General  Level of Consciousness: awake, alert  and oriented  Airway & Oxygen Therapy: Patient Spontanous Breathing and Patient connected to face mask oxygen  Post-op Assessment: Report given to RN and Post -op Vital signs reviewed and stable  Post vital signs: Reviewed and stable  Last Vitals:  Vitals:   12/30/16 0930 12/30/16 1615  BP: (!) 144/94   Pulse: (!) 102   Resp:    Temp: (!) 38.2 C (P) 37.7 C    Last Pain:  Vitals:   12/30/16 1323  TempSrc:   PainSc: 4       Patients Stated Pain Goal: 4 (12/30/16 1323)  Complications: No apparent anesthesia complications

## 2016-12-30 NOTE — Interval H&P Note (Signed)
General Surgery [facility]  Patient seen and examined again.  Persistent abdominal pain, now RUQ.  WBC now elevated at 14K.  USN with gallbladder wall thickening consistent with acute cholecystitis.  Plan lap chole with iOC.  The risks and benefits of the procedure have been discussed at length with the patient.  The patient understands the proposed procedure, potential alternative treatments, and the course of recovery to be expected.  All of the patient's questions have been answered at this time.  The patient wishes to proceed with surgery.  Velora Hecklerodd M. Laresha Bacorn, MD, [facility] Office: [phone]

## 2016-12-30 NOTE — Anesthesia Postprocedure Evaluation (Signed)
Anesthesia Post Note  Patient: Timothy Browning  Procedure(s) Performed: Procedure(s) (LRB): LAPAROSCOPIC CHOLECYSTECTOMY WITH INTRAOPERATIVE CHOLANGIOGRAM (N/A)  Patient location during evaluation: PACU Anesthesia Type: General Level of consciousness: sedated and patient cooperative Pain management: pain level controlled Vital Signs Assessment: post-procedure vital signs reviewed and stable Respiratory status: spontaneous breathing Cardiovascular status: stable Anesthetic complications: no       Last Vitals:  Vitals:   12/30/16 1719 12/30/16 1819  BP: (!) 152/92 (!) 145/84  Pulse:  97  Resp: 18 (!) 22  Temp: 37.8 C 37.7 C    Last Pain:  Vitals:   12/30/16 1819  TempSrc: Oral  PainSc:                  Timothy Browning

## 2016-12-30 NOTE — Op Note (Signed)
Procedure Note  Pre-operative Diagnosis:  Acute cholecystitis, cholelithiasis  Post-operative Diagnosis:  same  Surgeon:  Velora Hecklerodd M. Atoya Andrew, MD, FACS  Assistant:  Manus RuddMatthew Tsuei, MD, FACS   Procedure:  Laparoscopic cholecystectomy with intra-operative cholangiography  Anesthesia:  General  Estimated Blood Loss:  minimal  Drains: none         Specimen: Gallbladder to pathology  Indications:  Patient is a 46 yo BM who presented to the ER with epigastric abd pain.  Follow up labs showed elevated WBC 14K.  Repeat USN showed changes consistent with acute cholecystitis.  Patient now prepared and brought to operating room for cholecystectomy.  Procedure Details:  The patient was seen in the pre-op holding area. The risks, benefits, complications, treatment options, and expected outcomes were previously discussed with the patient. The patient agreed with the proposed plan and has signed the informed consent form.  The patient was brought to the Operating Room, identified as Timothy Browning and the procedure verified as laparoscopic cholecystectomy with intraoperative cholangiography. A "time out" was completed and the above information confirmed.  The patient was placed in the supine position. Following induction of general anesthesia, the abdomen was prepped and draped in the usual aseptic fashion.  An incision was made in the skin near the umbilicus. The midline fascia was incised and the peritoneal cavity was entered and a Hasson canula was introduced under direct vision.  The Hasson canula was secured with a 0-Vicryl pursestring suture. Pneumoperitoneum was established with carbon dioxide. Additional trocars were introduced under direct vision along the right costal margin in the midline, mid-clavicular line, and anterior axillary line.   The gallbladder was identified and the fundus grasped and retracted cephalad. Adhesions were taken down bluntly and the electrocautery was utilized as needed,  taking care not to injure any adjacent structures. The infundibulum was grasped and retracted laterally, exposing the peritoneum overlying the triangle of Calot. The peritoneum was incised and structures exposed with blunt dissection. The cystic duct was clearly identified, bluntly dissected circumferentially, and clipped at the neck of the gallbladder.  An incision was made in the cystic duct and the cholangiogram catheter introduced. The catheter was secured using an ligaclip.  Real-time cholangiography was performed using C-arm fluoroscopy.  There was rapid filling of a normal caliber common bile duct.  There was reflux of contrast into the left and right hepatic ductal systems.  There was free flow distally into the duodenum without filling defect or obstruction.  The catheter was removed from the peritoneal cavity.  The cystic duct was then ligated with surgical clips and divided. The cystic artery was identified, dissected circumferentially, ligated with ligaclips, and divided.  The gallbladder was dissected away from the liver bed using the electrocautery for hemostasis. The gallbladder was completely removed from the liver and placed into an endocatch bag. The gallbladder was removed in the endocatch bag through the umbilical port site and submitted to pathology for review.  The right upper quadrant was irrigated and the gallbladder bed was inspected. Hemostasis was achieved with the electrocautery.  Pneumoperitoneum was released after viewing removal of the trocars with good hemostasis noted. The umbilical wound was irrigated and the fascia was then closed with the pursestring suture.  Local anesthetic was infiltrated at all port sites. The skin incisions were closed with 4-0 Monocril subcuticular sutures and steri-strips and dressings were applied.  Instrument, sponge, and needle counts were correct at the conclusion of the case.  The patient was awakened from anesthesia and  brought to the  recovery room in stable condition.  The patient tolerated the procedure well.   Velora Heckler, MD, [facility] Office: [phone]

## 2016-12-31 MED ORDER — AMOXICILLIN-POT CLAVULANATE 875-125 MG PO TABS: 1.0000 | ORAL_TABLET | Freq: Two times a day (BID) | ORAL | 0 refills | Status: DC

## 2016-12-31 NOTE — Discharge Summary (Signed)
[facility] Discharge Summary   Patient ID: Timothy Browning MRN: [id number] DOB/AGE: 45-Jan-1972 45 y.o.  Admit date: 12/30/2016 Discharge date: 12/31/2016  Admitting Diagnosis: Acute cholecystitis  Discharge Diagnosis Patient Active Problem List   Diagnosis Date Noted  . Acute cholecystitis 12/30/2016  . Foot drop, right 10/13/2014    Consultants None  Imaging: Dg Cholangiogram Operative  Result Date: 12/30/2016 CLINICAL DATA:  Cholelithiasis, cholecystectomy EXAM: INTRAOPERATIVE CHOLANGIOGRAM TECHNIQUE: Cholangiographic images from the C-arm fluoroscopic device were submitted for interpretation post-operatively. Please see the procedural report for the amount of contrast and the fluoroscopy time utilized. COMPARISON:  12/30/2016 FINDINGS: Intraoperative cholangiogram performed during the laparoscopic procedure. Surgical artifact overlies the common bile duct. The cystic duct, biliary confluence, common hepatic duct, and common bile duct are patent. Contrast drains into the duodenum. No dilatation or obstruction. No significant filling defect or stricture. IMPRESSION: Patent biliary system. Electronically Signed   By: Judie PetitM.  Shick M.D.   On: 12/30/2016 17:11   [place] Abdomen Limited Ruq  Result Date: 12/30/2016 CLINICAL DATA:  Patient had an abdominal ultrasound less than 24 hours ago showing gallstones. Right upper quadrant pain is increased since then. Check for acute cholecystitis. EXAM: US ABDOMEN LIMITED - RIGHT UPPER QUADRANT COMPARISON:  12/29/2016 FINDINGS: Gallbladder: Multiple stones in the gallbladder, measuring up to 1.7 cm diameter. No gallbladder wall thickening but there is suggestion of mild pericholecystic edema. Stones were present on the previous study. Murphy's sign is negative, but the patient is on pain medication, limiting the sensitivity of this sign. Common bile duct: Diameter: 6.4 mm, normal. Distal common bile duct is not visualized. Liver: No focal  lesion identified. Within normal limits in parenchymal echogenicity. IMPRESSION: Cholelithiasis with mild pericholecystic edema. Murphy's sign is unreliable in a patient on pain medications. Changes may be consistent with acute cholecystitis in the appropriate clinical setting. Electronically Signed   By: Burman NievesWilliam  Stevens M.D.   On: 12/30/2016 02:52    Procedures Dr. Gerrit FriendsGerkin (12/30/16) - [facility]  Hospital Course:  Timothy GaulMario L Camposano is a 46yo male [facility] acute onset epigastric pain. Workup was essentially negative, therefore he was sent home with recommend GI follow-up. He returned to the ED the following day with persistent/worsening pain. This time u/s showed acute cholecystitis.  Patient was admitted and underwent procedure listed above.  Tolerated procedure well and was transferred to the floor.  Diet was advanced as tolerated.  On POD1 the patient was voiding well, tolerating diet, ambulating well, pain well controlled, vital signs stable, incisions c/d/i and felt stable for discharge home.  Patient will follow up in our office in 3 weeks and knows to call with questions or concerns.     Physical Exam: Gen:  Alert, NAD, pleasant Pulm:  Effort normal Abd: Soft, mild distension, nontender, +BS, multiple lap incisions C/D/I with clean/dry dressings  Allergies as of 12/31/2016   No Known Allergies     Medication List    STOP taking these medications   sucralfate 1 g tablet Commonly known as:  CARAFATE     TAKE these medications   amoxicillin-clavulanate 875-125 MG tablet Commonly known as:  AUGMENTIN Take 1 tablet by mouth 2 (two) times daily.   HYDROcodone-acetaminophen 5-325 MG tablet Commonly known as:  NORCO/VICODIN Take 1 tablet by mouth every 4 (four) hours as needed.   lansoprazole 30 MG capsule Commonly known as:  PREVACID Take 1 capsule (30 mg total) by mouth daily at 12 noon.  sildenafil 100 MG tablet Commonly  known as:  VIAGRA Take 50 mg by mouth daily as needed for erectile dysfunction.        Follow-up Information    Velora Heckler, MD. Go on 01/20/2017.   Specialty:  General Surgery Why:  Your appointment is 01/20/2017 at 10:15AM. Please arrive 30 minutes prior to your appointment to check in and fill out necessary paperwork. Contact information: [address] [place] [address] [phone]           Signed: Edson Snowball, [facility] 12/31/2016, 2:30 PM Pager: [phone] Consults: [phone] Mon-Fri 7:00 am-4:30 pm Sat-Sun 7:00 am-11:30 am

## 2016-12-31 NOTE — Progress Notes (Signed)
Patient ID: Timothy Browning, male   DOB: [birth year], 46 y.o.   MRN: [id number]  [facility] Progress Note  1 Day Post-Op  Subjective: Feeling well this morning. Denies abdominal pain, just sore. Tolerating clear liquids. Ambulated in halls last night. Passing flatus.  Objective: Vital signs in last 24 hours: Temp:  [98.4 F (36.9 C)-100.8 F (38.2 C)] 99.4 F (37.4 C) (03/15 0542) Pulse Rate:  [91-114] 91 (03/15 0542) Resp:  [18-27] 18 (03/15 0542) BP: (135-155)/(84-99) 135/99 (03/15 0542) SpO2:  [91 %-100 %] 99 % (03/15 0542) Last BM Date: 12/29/16  Intake/Output from previous day: 03/14 0701 - 03/15 0700 In: 3215 [P.O.:440; I.V.:2725; IV Piggyback:50] Out: 525 [Urine:500; Blood:25] Intake/Output this shift: No intake/output data recorded.  PE: Gen:  Alert, NAD, pleasant Pulm:  Effort normal Abd: Soft, mild distension, nontender, +BS, multiple lap incisions C/D/I with clean/dry dressings  Lab Results:   Recent Labs  12/29/16 0827 12/30/16 0113  WBC 6.9 14.0*  HGB 14.6 15.1  HCT 42.8 43.7  PLT 338 316   BMET  Recent Labs  12/29/16 0827 12/30/16 0113  NA 137 134*  K 3.5 4.0  CL 103 101  CO2 27 24  GLUCOSE 161* 131*  BUN 13 12  CREATININE 1.27* 1.29*  CALCIUM 9.1 9.1   PT/INR No results for input(s): LABPROT, INR in the last 72 hours. CMP     Component Value Date/Time   NA 134 (L) 12/30/2016 0113   K 4.0 12/30/2016 0113   CL 101 12/30/2016 0113   CO2 24 12/30/2016 0113   GLUCOSE 131 (H) 12/30/2016 0113   BUN 12 12/30/2016 0113   CREATININE 1.29 (H) 12/30/2016 0113   CALCIUM 9.1 12/30/2016 0113   PROT 7.7 12/30/2016 0113   ALBUMIN 4.0 12/30/2016 0113   AST 26 12/30/2016 0113   ALT 25 12/30/2016 0113   ALKPHOS 52 12/30/2016 0113   BILITOT 0.8 12/30/2016 0113   GFRNONAA >60 12/30/2016 0113   GFRAA >60 12/30/2016 0113   Lipase     Component Value Date/Time   LIPASE 18 12/30/2016 0113       Studies/Results: Dg Chest 2  View  Result Date: 12/29/2016 CLINICAL DATA:  Chest pain and shortness of breath EXAM: CHEST  2 VIEW COMPARISON:  None. FINDINGS: Lungs are clear. Heart size and pulmonary vascularity are normal. No adenopathy. No pneumothorax. No bone lesions. There are surgical clips in the left upper quadrant of the abdomen. IMPRESSION: No edema or consolidation. Electronically Signed   By: Bretta BangWilliam  Woodruff III M.D.   On: 12/29/2016 08:47   Dg Cholangiogram Operative  Result Date: 12/30/2016 CLINICAL DATA:  Cholelithiasis, cholecystectomy EXAM: INTRAOPERATIVE CHOLANGIOGRAM TECHNIQUE: Cholangiographic images from the C-arm fluoroscopic device were submitted for interpretation post-operatively. Please see the procedural report for the amount of contrast and the fluoroscopy time utilized. COMPARISON:  12/30/2016 FINDINGS: Intraoperative cholangiogram performed during the laparoscopic procedure. Surgical artifact overlies the common bile duct. The cystic duct, biliary confluence, common hepatic duct, and common bile duct are patent. Contrast drains into the duodenum. No dilatation or obstruction. No significant filling defect or stricture. IMPRESSION: Patent biliary system. Electronically Signed   By: Judie PetitM.  Shick M.D.   On: 12/30/2016 17:11   [place] Abdomen Complete  Result Date: 12/29/2016 CLINICAL DATA:  Upper abdominal pain EXAM: ABDOMEN ULTRASOUND COMPLETE COMPARISON:  None. FINDINGS: Gallbladder: Within the gallbladder, multiple echogenic foci which move and shadow consistent with cholelithiasis. Largest individual gallstone measures 1.9 cm in length. There is  no appreciable gallbladder wall thickening or pericholecystic fluid. No sonographic Murphy sign noted by sonographer. Common bile duct: Diameter: 7 mm, upper normal. No biliary duct mass or calculus evident. Liver: No focal lesion identified. Within normal limits in parenchymal echogenicity. IVC: No abnormality visualized. Pancreas: Visualized portion unremarkable.  Portions of pancreas obscured by gas. Spleen: Surgically absent. Right Kidney: Length: 10.4 cm. Echogenicity within normal limits. No mass or hydronephrosis visualized. Left Kidney: Length: 11.1 cm. Echogenicity within normal limits. No mass or hydronephrosis visualized. Abdominal aorta: No aneurysm visualized in visualized regions. Portions of aorta obscured by gas. Other findings: No demonstrable ascites. IMPRESSION: Cholelithiasis. Portions of aorta and pancreas obscured by gas. Visualized portions of these structures appear normal. Study otherwise unremarkable. Electronically Signed   By: Bretta Bang III M.D.   On: 12/29/2016 10:34   US Abdomen Limited Ruq  Result Date: 12/30/2016 CLINICAL DATA:  Patient had an abdominal ultrasound less than 24 hours ago showing gallstones. Right upper quadrant pain is increased since then. Check for acute cholecystitis. EXAM: US ABDOMEN LIMITED - RIGHT UPPER QUADRANT COMPARISON:  12/29/2016 FINDINGS: Gallbladder: Multiple stones in the gallbladder, measuring up to 1.7 cm diameter. No gallbladder wall thickening but there is suggestion of mild pericholecystic edema. Stones were present on the previous study. Murphy's sign is negative, but the patient is on pain medication, limiting the sensitivity of this sign. Common bile duct: Diameter: 6.4 mm, normal. Distal common bile duct is not visualized. Liver: No focal lesion identified. Within normal limits in parenchymal echogenicity. IMPRESSION: Cholelithiasis with mild pericholecystic edema. Murphy's sign is unreliable in a patient on pain medications. Changes may be consistent with acute cholecystitis in the appropriate clinical setting. Electronically Signed   By: Burman Nieves M.D.   On: 12/30/2016 02:52    Anti-infectives: Anti-infectives    Start     Dose/Rate Route Frequency Ordered Stop   12/31/16 0500  cefTRIAXone (ROCEPHIN) 2 g in dextrose 5 % 50 mL IVPB     2 g 100 mL/hr over 30 Minutes Intravenous  Every 24 hours 12/30/16 1730     12/30/16 0445  cefTRIAXone (ROCEPHIN) 2 g in dextrose 5 % 50 mL IVPB     2 g 100 mL/hr over 30 Minutes Intravenous  Once 12/30/16 0434 12/30/16 1610       Assessment/Plan Acute cholecystitis S/p Laparoscopic cholecystectomy with intra-operative cholangiography 3/14 Dr. Gerrit Friends - POD 1 - IOC: Patent biliary system.  ID - rocephin 3/14>> FEN - IVF, regular diet VTE - SCDs, lovenox  Plan - advance to regular diet. Plan to recheck later today for possible discharge. He will be on augmentin x5 days after discharge and follow-up in clinic in 3 weeks.   LOS: 1 day    Edson Snowball , [facility] 12/31/2016, 8:07 AM Pager: [phone] Consults: [phone] Mon-Fri 7:00 am-4:30 pm Sat-Sun 7:00 am-11:30 am

## 2017-01-05 ENCOUNTER — Ambulatory Visit: Payer: Self-pay | Admitting: Physician Assistant

## 2017-02-10 ENCOUNTER — Ambulatory Visit (INDEPENDENT_AMBULATORY_CARE_PROVIDER_SITE_OTHER): Payer: Self-pay | Admitting: Family Medicine

## 2017-02-10 ENCOUNTER — Encounter: Payer: Self-pay | Admitting: Family Medicine

## 2017-02-10 VITALS — BP 131/91 | HR 90 | Temp 98.4°F | Resp 16 | Ht 74.0 in | Wt 226.0 lb

## 2017-02-10 DIAGNOSIS — R8290 Unspecified abnormal findings in urine: Secondary | ICD-10-CM

## 2017-02-10 DIAGNOSIS — Z0000 Encounter for general adult medical examination without abnormal findings: Secondary | ICD-10-CM

## 2017-02-10 DIAGNOSIS — R002 Palpitations: Secondary | ICD-10-CM

## 2017-02-10 DIAGNOSIS — Z9049 Acquired absence of other specified parts of digestive tract: Secondary | ICD-10-CM

## 2017-02-10 DIAGNOSIS — K219 Gastro-esophageal reflux disease without esophagitis: Secondary | ICD-10-CM

## 2017-02-10 LAB — POCT URINALYSIS DIP (MANUAL ENTRY)
Bilirubin, UA: NEGATIVE
GLUCOSE UA: NEGATIVE mg/dL
NITRITE UA: NEGATIVE
Spec Grav, UA: 1.015 (ref 1.010–1.025)
UROBILINOGEN UA: 0.2 U/dL
pH, UA: 7 (ref 5.0–8.0)

## 2017-02-10 MED ORDER — FAMOTIDINE 10 MG PO TABS: 10.0000 mg | ORAL_TABLET | Freq: Two times a day (BID) | ORAL | Status: DC

## 2017-02-10 NOTE — Patient Instructions (Addendum)
   IF you received an x-ray today, you will receive an invoice from [facility]. Please contact [facility] at [phone] with questions or concerns regarding your invoice.   IF you received labwork today, you will receive an invoice from [facility]. Please contact [facility] at [phone] with questions or concerns regarding your invoice.   Our billing staff will not be able to assist you with questions regarding bills from these companies.  You will be contacted with the lab results as soon as they are available. The fastest way to get your results is to activate your My Chart account. Instructions are located on the last page of this paperwork. If you have not heard from us regarding the results in 2 weeks, please contact this office.     Premature Ventricular Contraction A premature ventricular contraction (PVC) is a common irregularity in the normal heart rhythm. These contractions are extra heartbeats that start in the heart ventricles and occur too early in the normal sequence. During the PVC, the heart's normal electrical pathway is not used, so the beat is shorter and less effective. In most cases, these contractions come and go and do not require treatment. What are the causes? In many cases, the cause may not be known. Common causes of the condition include:  Smoking.  Drinking alcohol.  Caffeine.  Certain medicines.  Some illegal drugs.  Stress. Certain medical conditions can also cause PVCs:  Changes in minerals in the blood (electrolytes).  Heart failure.  Heart valve problems.  Low blood oxygen levels or high carbon dioxide levels.  Heart attack, or coronary artery disease. What are the signs or symptoms? The main symptom of this condition is a fast or skipped heartbeat (palpitations). Other symptoms include:  Chest pain.  Shortness of breath.  Feeling tired.  Dizziness. In some cases, there are no symptoms. How is this  diagnosed? This condition may be diagnosed based on:  Your medical history.  A physical exam. During the exam, the health care provider will check for irregular heartbeats.  Tests, such as:  An ECG (electrocardiogram) to monitor the electrical activity of your heart.  Holter monitor testing. This involves wearing a device that clips to your clothing and monitors the electrical activity of your heart over longer periods of time.  Stress tests to see how exercise affects your heart rhythm and blood supply.  Echocardiogram. This test uses sound waves (ultrasound) to produce an image of your heart.  Electrophysiology study. This test checks the electric pathways in your heart. How is this treated? Treatment depends on any underlying conditions, the type of PVCs that you are having, and how much the symptoms are interfering with your daily life. Possible treatments include:  Avoiding things that can trigger the premature contractions, such as caffeine or alcohol.  Medicines. These may be given if symptoms are severe or if the extra heartbeats are frequent.  Treatment for any underlying condition that is found to be the cause of the contractions.  Catheter ablation. This procedure destroys the heart tissues that send abnormal signals. In some cases, no treatment is required. Follow these instructions at home: Lifestyle Follow these instructions as told by your health care provider:  Do not use any products that contain nicotine or tobacco, such as cigarettes and e-cigarettes. If you need help quitting, ask your health care provider.  If caffeine triggers episodes of PVC, do not eat, drink, or use anything with caffeine in it.  If caffeine does not seem to   trigger episodes, consume caffeine in moderation.  If alcohol triggers episodes of PVC, do not drink alcohol.  If alcohol does not seem to trigger episodes, limit alcohol intake to no more than 1 drink a day for nonpregnant women  and 2 drinks a day for men. One drink equals 12 oz of beer, 5 oz of wine, or 1 oz of hard liquor.  Exercise regularly. Ask your health care provider what type of exercise is safe for you.  Find healthy ways to manage stress. Avoid stressful situations when possible.  Try to get at least 7-9 hours of sleep each night, or as much as recommended by your health care provider.  Do not use illegal drugs. General instructions  Take over-the-counter and prescription medicines only as told by your health care provider.  Keep all follow-up visits as told by your health care provider. This is important. Get help right away if:  You feel palpitations that are frequent or continual.  You have chest pain.  You have shortness of breath.  You have sweating for no reason.  You have nausea and vomiting.  You become light-headed or you faint. This information is not intended to replace advice given to you by your health care provider. Make sure you discuss any questions you have with your health care provider. Document Released: 05/22/2004 Document Revised: 05/29/2016 Document Reviewed: 03/11/2016 Elsevier Interactive Patient Education  2017 [employer] Inc[employer]  
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
## 2017-02-11 LAB — COMPREHENSIVE METABOLIC PANEL
A/G RATIO: 1.5 (ref 1.2–2.2)
ALBUMIN: 4.1 g/dL (ref 3.5–5.5)
ALK PHOS: 77 IU/L (ref 39–117)
ALT: 23 IU/L (ref 0–44)
AST: 19 IU/L (ref 0–40)
BUN / CREAT RATIO: 12 (ref 9–20)
BUN: 14 mg/dL (ref 6–24)
Bilirubin Total: 0.3 mg/dL (ref 0.0–1.2)
CO2: 25 mmol/L (ref 18–29)
CREATININE: 1.16 mg/dL (ref 0.76–1.27)
Calcium: 9.4 mg/dL (ref 8.7–10.2)
Chloride: 99 mmol/L (ref 96–106)
GFR calc Af Amer: 88 mL/min/{1.73_m2} (ref >59)
GFR, EST NON AFRICAN AMERICAN: 76 mL/min/{1.73_m2} (ref >59)
GLOBULIN, TOTAL: 2.8 g/dL (ref 1.5–4.5)
Glucose: 85 mg/dL (ref 65–99)
Potassium: 4.4 mmol/L (ref 3.5–5.2)
SODIUM: 139 mmol/L (ref 134–144)
Total Protein: 6.9 g/dL (ref 6.0–8.5)

## 2017-02-11 LAB — CBC
HEMATOCRIT: 43.4 % (ref 37.5–51.0)
HEMOGLOBIN: 14.5 g/dL (ref 13.0–17.7)
MCH: 30.9 pg (ref 26.6–33.0)
MCHC: 33.4 g/dL (ref 31.5–35.7)
MCV: 92 fL (ref 79–97)
Platelets: 384 10*3/uL — ABNORMAL HIGH (ref 150–379)
RBC: 4.7 x10E6/uL (ref 4.14–5.80)
RDW: 13.7 % (ref 12.3–15.4)
WBC: 4.7 10*3/uL (ref 3.4–10.8)

## 2017-02-11 LAB — TSH: TSH: 1.82 u[IU]/mL (ref 0.450–4.500)

## 2017-02-18 ENCOUNTER — Telehealth: Payer: Self-pay

## 2017-02-18 NOTE — Telephone Encounter (Signed)
Called and informed patient to come back in for Urine test. Pt agreed

## 2017-02-18 NOTE — Telephone Encounter (Signed)
-----   Message from Doristine BosworthZoe A Stallings, MD sent at 02/14/2017 11:00 AM EDT ----- Please let the patient know to come in for a repeat urine testing since the last one was abnormal. He should be prepared to leave a fresh sample at the clinic.  Thank you  Dr. Creta LevinStallings

## 2017-02-23 ENCOUNTER — Other Ambulatory Visit: Payer: Self-pay | Admitting: Family Medicine

## 2017-02-23 DIAGNOSIS — R8290 Unspecified abnormal findings in urine: Secondary | ICD-10-CM

## 2017-02-24 LAB — URINALYSIS, MICROSCOPIC ONLY

## 2017-02-24 LAB — URINE CULTURE

## 2017-03-03 ENCOUNTER — Ambulatory Visit (INDEPENDENT_AMBULATORY_CARE_PROVIDER_SITE_OTHER): Payer: Self-pay

## 2017-03-03 DIAGNOSIS — R002 Palpitations: Secondary | ICD-10-CM

## 2017-03-15 ENCOUNTER — Telehealth: Payer: Self-pay | Admitting: Interventional Cardiology

## 2017-03-15 NOTE — Telephone Encounter (Signed)
Please check the last two pages of strips.  There appear to be very rapid heart rates but I can't tell if they are real or not.  Please let me know and I will sign the study.

## 2017-03-15 NOTE — Progress Notes (Signed)
Lab visit only.  No provider encounter. 

## 2017-04-01 NOTE — Telephone Encounter (Signed)
Spoke with Fatima BlankMitch Daniel at [facility].  He apologized stating,  this has been an ongoing problem with the [facility] Holter report full disclosure.  The full disclosure condenses the EKG, so it appears the rate is much higher than actual.  Maximum heart rate on this patient was 142, which, is represented on page 24 of the 03/03/2017 holter monitor report.  He has spoken with many cardiologists regarding the same issue [facility], full disclosure will now be omitted from the report.   Mr. Levada SchillingSummers holter report has been deleted and the new format has been imported for Dr. Eldridge DaceVaranasi to review.

## 2017-04-06 NOTE — Telephone Encounter (Signed)
Shelly, Have they sent back this report?

## 2017-04-19 ENCOUNTER — Telehealth: Payer: Self-pay | Admitting: Family Medicine

## 2017-04-19 NOTE — Telephone Encounter (Signed)
Pt called requesting someone to go over results with him. He said he was sent to do a test where he had to wear heart monitors for a week but was never called with the results. Please advise. Pt callback number is [phone].

## 2017-04-20 NOTE — Telephone Encounter (Signed)
Please advise 

## 2017-04-29 MED ORDER — METOPROLOL TARTRATE 25 MG PO TABS: 25.0000 mg | ORAL_TABLET | Freq: Every day | ORAL | 0 refills | Status: DC | PRN

## 2017-04-29 NOTE — Telephone Encounter (Signed)
LVM to have pt call back

## 2017-04-29 NOTE — Telephone Encounter (Signed)
Please let the patient know that he should return to the office so I can review his test results with him.  He has a few episodes of rapid heart rate but otherwise his test was pretty normal. I have sent in a medication called a beta blocker metoprolol for him to take when he feels the palpitations.  He should not take more than 2 a day.  Please set him up with an appointment to return

## 2017-12-10 ENCOUNTER — Ambulatory Visit: Payer: Self-pay | Admitting: *Deleted

## 2017-12-10 NOTE — Telephone Encounter (Signed)
Called in c/o "a fluttering sensation in his chest that started getting worse over the past week"    He was seen a year ago by Dr. Creta LevinStallings for the same symptoms but they were much worse than they are now.   He never followed up on it a year ago.   He decided since he was having the symptoms again that he should be evaluated before they got bad like last year.   He wore a monitor for a week last year but does not know the results.   They thought he had GERD but he had his gallbladder removed and the GERD symptoms went away.  I went over the care advice information with him and instructed him to call 911 or go to the ED if he experienced any of s/s listed in the care advice.   He verbalized understanding and agreed to these instructions.  I especially emphasized to not drink the energy drinks until seen by Dr. Creta LevinStallings.  He has an appt with Dr. Creta LevinStallings on Monday 12/13/17 at 11:40am.   Reason for Disposition . [1] Palpitations AND [2] no improvement after following Care Advice  Answer Assessment - Initial Assessment Questions 1. DESCRIPTION: "Please describe your heart rate or heart beat that you are having" (e.g., fast/slow, regular/irregular, skipped or extra beats, "palpitations")     I feel a quick fast flutter and then goes away.   It lasts a few seconds a few times a day for the last week it happens 3-4 times a day. 2. ONSET: "When did it start?" (Minutes, hours or days)      Over the last week it has gotten worse.   I saw Dr. Creta LevinStallings last year for this and wore a monitor.   I work out 3 times a wk lifting weights.   I drink a drink that is high in caffiene I thought it was causing it but it's no different than when I don't drink the drink. 3. DURATION: "How long does it last" (e.g., seconds, minutes, hours)     It lasts 5-6 seconds when it happens.   A flutter. 4. PATTERN "Does it come and go, or has it been constant since it started?"  "Does it get worse with exertion?"   "Are you feeling  it now?"     I can tell it's something different.    It does happen sometimes when I'm lifting weights but I don't feel any different other than the flutter. 5. TAP: "Using your hand, can you tap out what you are feeling on a chair or table in front of you, so that I can hear?" (Note: not all patients can do this)       *No Answer* 6. HEART RATE: "Can you tell me your heart rate?" "How many beats in 15 seconds?"  (Note: not all patients can do this)       *No Answer* 7. RECURRENT SYMPTOM: "Have you ever had this before?" If so, ask: "When was the last time?" and "What happened that time?"      I've had a short busy spell of dizziness.   It was much worse when this happened a year ago but when it started again a week ago I just felt I needed to get it looked into before it got bad. 8. CAUSE: "What do you think is causing the palpitations?"     I'm not sure.   Sometimes after I eat a heavy meal I might have a short  flutter feeling for a few seconds.   They thought I had GERD but after my glallbladder was removed I no longer have the problem. 9. CARDIAC HISTORY: "Do you have any history of heart disease?" (e.g., heart attack, angina, bypass surgery, angioplasty, arrhythmia)      Just same symptoms but worse last year.   I wore a monitor for a week but don't know the results. 10. OTHER SYMPTOMS: "Do you have any other symptoms?" (e.g., dizziness, chest pain, sweating, difficulty breathing)       No chest pain, or sweating or difficulty breathing.    I might take a couple of deep breaths when it happens and it goes away.   It's kinda like being startled. 11. PREGNANCY: "Is there any chance you are pregnant?" "When was your last menstrual period?"       N/A  Protocols used: HEART RATE AND HEARTBEAT QUESTIONS-A-AH

## 2017-12-13 ENCOUNTER — Ambulatory Visit (INDEPENDENT_AMBULATORY_CARE_PROVIDER_SITE_OTHER): Payer: Self-pay | Admitting: Family Medicine

## 2017-12-13 ENCOUNTER — Encounter: Payer: Self-pay | Admitting: Family Medicine

## 2017-12-13 ENCOUNTER — Other Ambulatory Visit: Payer: Self-pay

## 2017-12-13 VITALS — BP 125/88 | HR 85 | Temp 98.5°F | Resp 17 | Ht 74.0 in | Wt 226.4 lb

## 2017-12-13 DIAGNOSIS — K219 Gastro-esophageal reflux disease without esophagitis: Secondary | ICD-10-CM

## 2017-12-13 DIAGNOSIS — R002 Palpitations: Secondary | ICD-10-CM

## 2017-12-13 MED ORDER — METOPROLOL TARTRATE 25 MG PO TABS: 25.0000 mg | ORAL_TABLET | Freq: Every day | ORAL | 0 refills | Status: DC | PRN

## 2017-12-13 MED ORDER — FAMOTIDINE 10 MG PO TABS: 10.0000 mg | ORAL_TABLET | Freq: Two times a day (BID) | ORAL | 3 refills | Status: DC
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
## 2017-12-13 NOTE — Patient Instructions (Addendum)
     IF you received an x-ray today, you will receive an invoice from [facility]. Please contact [facility] at [phone] with questions or concerns regarding your invoice.   IF you received labwork today, you will receive an invoice from [facility]. Please contact [facility] at [phone] with questions or concerns regarding your invoice.   Our billing staff will not be able to assist you with questions regarding bills from these companies.  You will be contacted with the lab results as soon as they are available. The fastest way to get your results is to activate your My Chart account. Instructions are located on the last page of this paperwork. If you have not heard from us regarding the results in 2 weeks, please contact this office.     Palpitations A palpitation is the feeling that your heartbeat is irregular or is faster than normal. It may feel like your heart is fluttering or skipping a beat. Palpitations are usually not a serious problem. They may be caused by many things, including smoking, caffeine, alcohol, stress, and certain medicines. Although most causes of palpitations are not serious, palpitations can be a sign of a serious medical problem. In some cases, you may need further medical evaluation. Follow these instructions at home: Pay attention to any changes in your symptoms. Take these actions to help with your condition:  Avoid the following: ? Caffeinated coffee, tea, soft drinks, diet pills, and energy drinks. ? Chocolate. ? Alcohol.  Do not use any tobacco products, such as cigarettes, chewing tobacco, and e-cigarettes. If you need help quitting, ask your health care provider.  Try to reduce your stress and anxiety. Things that can help you relax include: ? Yoga. ? Meditation. ? Physical activity, such as swimming, jogging, or walking. ? Biofeedback. This is a method that helps you learn to use your mind to control things in your body, such as  your heartbeats.  Get plenty of rest and sleep.  Take over-the-counter and prescription medicines only as told by your health care provider.  Keep all follow-up visits as told by your health care provider. This is important.  Contact a health care provider if:  You continue to have a fast or irregular heartbeat after 24 hours.  Your palpitations occur more often. Get help right away if:  You have chest pain or shortness of breath.  You have a severe headache.  You feel dizzy or you faint. This information is not intended to replace advice given to you by your health care provider. Make sure you discuss any questions you have with your health care provider. Document Released: 10/02/2000 Document Revised: 03/09/2016 Document Reviewed: 06/20/2015 Elsevier Interactive Patient Education  2018 Elsevier Inc.  

## 2017-12-14 LAB — CBC
HEMOGLOBIN: 15.2 g/dL (ref 13.0–17.7)
Hematocrit: 46.2 % (ref 37.5–51.0)
MCH: 30.8 pg (ref 26.6–33.0)
MCHC: 32.9 g/dL (ref 31.5–35.7)
MCV: 94 fL (ref 79–97)
Platelets: 333 10*3/uL (ref 150–379)
RBC: 4.94 x10E6/uL (ref 4.14–5.80)
RDW: 13.5 % (ref 12.3–15.4)
WBC: 2.9 10*3/uL — ABNORMAL LOW (ref 3.4–10.8)

## 2017-12-14 LAB — COMPREHENSIVE METABOLIC PANEL
ALBUMIN: 4 g/dL (ref 3.5–5.5)
ALK PHOS: 70 IU/L (ref 39–117)
ALT: 24 IU/L (ref 0–44)
AST: 19 IU/L (ref 0–40)
Albumin/Globulin Ratio: 1.5 (ref 1.2–2.2)
BUN / CREAT RATIO: 7 — AB (ref 9–20)
BUN: 10 mg/dL (ref 6–24)
CHLORIDE: 100 mmol/L (ref 96–106)
CO2: 24 mmol/L (ref 20–29)
CREATININE: 1.4 mg/dL — AB (ref 0.76–1.27)
Calcium: 8.9 mg/dL (ref 8.7–10.2)
GFR calc Af Amer: 70 mL/min/{1.73_m2} (ref >59)
GFR calc non Af Amer: 60 mL/min/{1.73_m2} (ref >59)
Globulin, Total: 2.7 g/dL (ref 1.5–4.5)
Glucose: 104 mg/dL — ABNORMAL HIGH (ref 65–99)
Potassium: 4.4 mmol/L (ref 3.5–5.2)
Sodium: 138 mmol/L (ref 134–144)
Total Protein: 6.7 g/dL (ref 6.0–8.5)

## 2017-12-15 ENCOUNTER — Encounter: Payer: Self-pay | Admitting: Family Medicine

## 2018-03-10 ENCOUNTER — Encounter: Payer: Self-pay | Admitting: Family Medicine

## 2019-09-22 ENCOUNTER — Other Ambulatory Visit: Payer: Self-pay

## 2019-09-22 DIAGNOSIS — Z20822 Contact with and (suspected) exposure to covid-19: Secondary | ICD-10-CM

## 2019-09-23 LAB — NOVEL CORONAVIRUS, NAA: SARS-CoV-2, NAA: DETECTED — AB

## 2020-02-06 ENCOUNTER — Ambulatory Visit (INDEPENDENT_AMBULATORY_CARE_PROVIDER_SITE_OTHER): Payer: Self-pay

## 2020-02-06 ENCOUNTER — Ambulatory Visit (INDEPENDENT_AMBULATORY_CARE_PROVIDER_SITE_OTHER): Payer: Self-pay | Admitting: Registered Nurse

## 2020-02-06 ENCOUNTER — Other Ambulatory Visit: Payer: Self-pay

## 2020-02-06 ENCOUNTER — Encounter: Payer: Self-pay | Admitting: Registered Nurse

## 2020-02-06 VITALS — BP 129/88 | HR 84 | Temp 98.2°F | Resp 16 | Ht 74.0 in | Wt 234.2 lb

## 2020-02-06 DIAGNOSIS — M545 Low back pain, unspecified: Secondary | ICD-10-CM

## 2020-02-06 DIAGNOSIS — Z130 Encounter for screening for diseases of the blood and blood-forming organs and certain disorders involving the immune mechanism: Secondary | ICD-10-CM

## 2020-02-06 DIAGNOSIS — Z13220 Encounter for screening for lipoid disorders: Secondary | ICD-10-CM

## 2020-02-06 DIAGNOSIS — Z789 Other specified health status: Secondary | ICD-10-CM

## 2020-02-06 DIAGNOSIS — Z1329 Encounter for screening for other suspected endocrine disorder: Secondary | ICD-10-CM

## 2020-02-06 DIAGNOSIS — Z13228 Encounter for screening for other metabolic disorders: Secondary | ICD-10-CM

## 2020-02-06 MED ORDER — METHOCARBAMOL 500 MG PO TABS: 500.0000 mg | ORAL_TABLET | Freq: Four times a day (QID) | ORAL | 0 refills | Status: DC

## 2020-02-06 MED ORDER — MELOXICAM 15 MG PO TABS: 15.0000 mg | ORAL_TABLET | Freq: Every day | ORAL | 0 refills | Status: DC

## 2020-02-06 NOTE — Patient Instructions (Signed)
° ° ° °  If you have lab work done today you will be contacted with your lab results within the next 2 weeks.  If you have not heard from us then please contact us. The fastest way to get your results is to register for My Chart. ° ° °IF you received an x-ray today, you will receive an invoice from [facility]. Please contact [facility] at [phone] with questions or concerns regarding your invoice.  ° °IF you received labwork today, you will receive an invoice from [facility]. Please contact [facility] at [phone] with questions or concerns regarding your invoice.  ° °Our billing staff will not be able to assist you with questions regarding bills from these companies. ° °You will be contacted with the lab results as soon as they are available. The fastest way to get your results is to activate your My Chart account. Instructions are located on the last page of this paperwork. If you have not heard from us regarding the results in 2 weeks, please contact this office. °  ° ° ° °
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
## 2020-02-07 LAB — COMPREHENSIVE METABOLIC PANEL
ALT: 21 IU/L (ref 0–44)
AST: 23 IU/L (ref 0–40)
Albumin/Globulin Ratio: 1.5 (ref 1.2–2.2)
Albumin: 4.1 g/dL (ref 4.0–5.0)
Alkaline Phosphatase: 76 IU/L (ref 39–117)
BUN/Creatinine Ratio: 10 (ref 9–20)
BUN: 14 mg/dL (ref 6–24)
Bilirubin Total: <0.2 mg/dL (ref 0.0–1.2)
CO2: 23 mmol/L (ref 20–29)
Calcium: 9.4 mg/dL (ref 8.7–10.2)
Chloride: 103 mmol/L (ref 96–106)
Creatinine, Ser: 1.42 mg/dL — ABNORMAL HIGH (ref 0.76–1.27)
GFR calc Af Amer: 68 mL/min/{1.73_m2} (ref >59)
GFR calc non Af Amer: 58 mL/min/{1.73_m2} — ABNORMAL LOW (ref >59)
Globulin, Total: 2.7 g/dL (ref 1.5–4.5)
Glucose: 84 mg/dL (ref 65–99)
Potassium: 4.1 mmol/L (ref 3.5–5.2)
Sodium: 139 mmol/L (ref 134–144)
Total Protein: 6.8 g/dL (ref 6.0–8.5)

## 2020-02-07 LAB — LIPID PANEL
Chol/HDL Ratio: 3.1 ratio (ref 0.0–5.0)
Cholesterol, Total: 181 mg/dL (ref 100–199)
HDL: 59 mg/dL (ref >39)
LDL Chol Calc (NIH): 100 mg/dL — ABNORMAL HIGH (ref 0–99)
Triglycerides: 125 mg/dL (ref 0–149)
VLDL Cholesterol Cal: 22 mg/dL (ref 5–40)

## 2020-02-07 LAB — CBC WITH DIFFERENTIAL/PLATELET
Basophils Absolute: 0 10*3/uL (ref 0.0–0.2)
Basos: 1 %
EOS (ABSOLUTE): 0.1 10*3/uL (ref 0.0–0.4)
Eos: 1 %
Hematocrit: 46.3 % (ref 37.5–51.0)
Hemoglobin: 15.8 g/dL (ref 13.0–17.7)
Immature Grans (Abs): 0 10*3/uL (ref 0.0–0.1)
Immature Granulocytes: 0 %
Lymphocytes Absolute: 1.6 10*3/uL (ref 0.7–3.1)
Lymphs: 30 %
MCH: 31.3 pg (ref 26.6–33.0)
MCHC: 34.1 g/dL (ref 31.5–35.7)
MCV: 92 fL (ref 79–97)
Monocytes Absolute: 0.7 10*3/uL (ref 0.1–0.9)
Monocytes: 12 %
Neutrophils Absolute: 3.1 10*3/uL (ref 1.4–7.0)
Neutrophils: 56 %
Platelets: 346 10*3/uL (ref 150–450)
RBC: 5.05 x10E6/uL (ref 4.14–5.80)
RDW: 12 % (ref 11.6–15.4)
WBC: 5.5 10*3/uL (ref 3.4–10.8)

## 2020-02-12 ENCOUNTER — Encounter: Payer: Self-pay | Admitting: Registered Nurse

## 2020-06-18 ENCOUNTER — Encounter: Payer: Self-pay | Admitting: Registered Nurse

## 2020-06-18 ENCOUNTER — Ambulatory Visit (INDEPENDENT_AMBULATORY_CARE_PROVIDER_SITE_OTHER): Payer: Self-pay | Admitting: Registered Nurse

## 2020-06-18 ENCOUNTER — Other Ambulatory Visit: Payer: Self-pay

## 2020-06-18 VITALS — BP 124/80 | HR 91 | Temp 98.5°F | Ht 72.0 in | Wt 229.2 lb

## 2020-06-18 DIAGNOSIS — M545 Low back pain, unspecified: Secondary | ICD-10-CM

## 2020-06-18 MED ORDER — TRAMADOL HCL 50 MG PO TABS: 50.0000 mg | ORAL_TABLET | Freq: Three times a day (TID) | ORAL | 0 refills | Status: AC | PRN

## 2020-06-18 MED ORDER — METHYLPREDNISOLONE ACETATE 40 MG/ML IJ SUSP
40.0000 mg | Freq: Once | INTRAMUSCULAR | Status: AC
  Administered 2020-06-18: 40 mg via INTRAMUSCULAR

## 2020-06-19 ENCOUNTER — Encounter: Payer: Self-pay | Admitting: Registered Nurse
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
## 2020-07-08 ENCOUNTER — Telehealth: Payer: Self-pay | Admitting: Registered Nurse

## 2020-07-08 NOTE — Telephone Encounter (Signed)
Referral Followup °

## 2021-06-26 ENCOUNTER — Emergency Department (HOSPITAL_COMMUNITY)
Admission: EM | Admit: 2021-06-26 | Discharge: 2021-06-27 | Disposition: A | Discharge status: Home / Self Care | Payer: Medicaid Other | Attending: Emergency Medicine | Admitting: Emergency Medicine

## 2021-06-26 ENCOUNTER — Emergency Department (HOSPITAL_COMMUNITY): Payer: Medicaid Other

## 2021-06-26 ENCOUNTER — Other Ambulatory Visit: Payer: Self-pay

## 2021-06-26 DIAGNOSIS — R7989 Other specified abnormal findings of blood chemistry: Secondary | ICD-10-CM | POA: Insufficient documentation

## 2021-06-26 DIAGNOSIS — R0789 Other chest pain: Secondary | ICD-10-CM | POA: Insufficient documentation

## 2021-06-26 LAB — CBC
HCT: 45.1 % (ref 39.0–52.0)
Hemoglobin: 15.3 g/dL (ref 13.0–17.0)
MCH: 31.8 pg (ref 26.0–34.0)
MCHC: 33.9 g/dL (ref 30.0–36.0)
MCV: 93.8 fL (ref 80.0–100.0)
Platelets: 306 10*3/uL (ref 150–400)
RBC: 4.81 MIL/uL (ref 4.22–5.81)
RDW: 13.6 % (ref 11.5–15.5)
WBC: 5.6 10*3/uL (ref 4.0–10.5)
nRBC: 0 % (ref 0.0–0.2)

## 2021-06-26 LAB — BASIC METABOLIC PANEL
Anion gap: 7 (ref 5–15)
BUN: 14 mg/dL (ref 6–20)
CO2: 24 mmol/L (ref 22–32)
Calcium: 9 mg/dL (ref 8.9–10.3)
Chloride: 104 mmol/L (ref 98–111)
Creatinine, Ser: 1.56 mg/dL — ABNORMAL HIGH (ref 0.61–1.24)
GFR, Estimated: 54 mL/min — ABNORMAL LOW (ref >60)
Glucose, Bld: 85 mg/dL (ref 70–99)
Potassium: 3.4 mmol/L — ABNORMAL LOW (ref 3.5–5.1)
Sodium: 135 mmol/L (ref 135–145)

## 2021-06-26 LAB — TROPONIN I (HIGH SENSITIVITY): Troponin I (High Sensitivity): 7 ng/L (ref <18)

## 2021-06-26 NOTE — ED Provider Notes (Signed)
Emergency Medicine Provider Triage Evaluation Note  HILBERTO BURZYNSKI , a 50 y.o. male  was evaluated in triage.  Pt complains of chest pain that started yesterday. Was working out yesterday morning bench pressing and felt pain in left chest with associated heart racing and heart "fluttering". Since that time he has intermittent left sided chest pain with associated SOB and dizziness, worse with exertion. No recent illness. No cardiac hx.  Review of Systems  Positive: CP, SOB, dizziness, palpitations Negative: Fever, chills, syncope, cough, congestion  Physical Exam  BP (!) 147/98 (BP Location: Left Arm)   Pulse 79   Temp 98.9 F (37.2 C) (Oral)   Resp 14   SpO2 100%  Gen:   Awake, no distress   Resp:  Normal effort  MSK:   Moves extremities without difficulty  Other:    Medical Decision Making  Medically screening exam initiated at 5:41 PM.  Appropriate orders placed.  [name] was informed that the remainder of the evaluation will be completed by another provider, this initial triage assessment does not replace that evaluation, and the importance of remaining in the ED until their evaluation is complete.     Jeanella Flattery 06/26/21 1746    Arby Barrette, MD 06/26/21 [phone]

## 2021-06-26 NOTE — ED Triage Notes (Signed)
C/o chest pain that started yesterday along w/ bilateral arm numbness. C/O head pressure as well that's been ongoing for a while. Denies PMH,

## 2021-06-27 LAB — TROPONIN I (HIGH SENSITIVITY): Troponin I (High Sensitivity): 9 ng/L (ref <18)
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
## 2021-06-27 NOTE — Discharge Instructions (Addendum)
Your work-up today for chest pain was overall reassuring. Your kidney function was slightly worse than it has been in the past.  Make sure you are staying well-hydrated water. This should be rechecked to ensure your kidney function does not continue to worsen. Avoid anti-inflammatories such as ibuprofen, Advil, Aleve. You may take tylneol as needed for pain.  I recommend you follow-up with a primary care doctor and a cardiologist, information for both these are listed below.  Return to the emergency room with any new, worsening, or concerning symptoms

## 2021-08-06 ENCOUNTER — Ambulatory Visit (INDEPENDENT_AMBULATORY_CARE_PROVIDER_SITE_OTHER): Payer: Self-pay | Admitting: Primary Care

## 2021-08-06 ENCOUNTER — Other Ambulatory Visit: Payer: Self-pay

## 2021-08-06 ENCOUNTER — Encounter (INDEPENDENT_AMBULATORY_CARE_PROVIDER_SITE_OTHER): Payer: Self-pay | Admitting: Primary Care

## 2021-08-06 VITALS — BP 136/93 | HR 75 | Temp 97.5°F | Ht 72.0 in | Wt 234.2 lb

## 2021-08-06 DIAGNOSIS — Z7689 Persons encountering health services in other specified circumstances: Secondary | ICD-10-CM

## 2021-08-06 DIAGNOSIS — Z09 Encounter for follow-up examination after completed treatment for conditions other than malignant neoplasm: Secondary | ICD-10-CM

## 2021-08-06 DIAGNOSIS — Z6831 Body mass index (BMI) 31.0-31.9, adult: Secondary | ICD-10-CM

## 2021-08-06 NOTE — Progress Notes (Signed)
  [facility]   Subjective:   [name] is a 50 y.o. male presents for hospital follow up and establish care.  Patient presented to EMERGENCY DEPARTMENT with complains of chest pain. He was working out yesterday morning bench pressing and felt pain in left chest with associated heart racing and heart "fluttering". He has intermittent left sided chest pain with associated SOB and dizziness, worse with exertion. Admit date to the hospital was 06/26/21, patient was discharged from the hospital on 06/27/21, patient was admitted for: chest pain.  Past Medical History:  Diagnosis Date   GERD (gastroesophageal reflux disease)     No Known Allergies  No current outpatient medications on file prior to visit.   No current facility-administered medications on file prior to visit.   Review of System: Review of Systems  All other systems reviewed and are negative.  Objective:  BP (!) 136/93 (BP Location: Right Arm, Patient Position: Sitting, Cuff Size: Large)   Pulse 75   Temp (!) 97.5 F (36.4 C) (Temporal)   Ht 6' (1.829 m)   Wt 234 lb 3.2 oz (106.2 kg)   SpO2 97%   BMI 31.76 kg/m Healthy weight for the height: 136.4 lbs - 184.3 lbs  Filed Weights   08/06/21 1542  Weight: 234 lb 3.2 oz (106.2 kg)   Physical Exam: General Appearance: Well nourished, in no apparent distress. Eyes: PERRLA, EOMs, conjunctiva no swelling or erythema Sinuses: No Frontal/maxillary tenderness ENT/Mouth: Ext aud canals clear, TMs without erythema, bulging.  Hearing normal.  Neck: Supple, thyroid normal.  Respiratory: Respiratory effort normal, BS equal bilaterally without rales, rhonchi, wheezing or stridor.  Cardio: RRR with no MRGs. Brisk peripheral pulses without edema.  Abdomen: Soft, + BS.  Non tender, no guarding, rebound, hernias, masses. Lymphatics: Non tender without lymphadenopathy.  Musculoskeletal: Full ROM, 5/5 strength, normal gait.  Skin: Warm, dry without rashes,  lesions, ecchymosis.  Neuro: Cranial nerves intact. Normal muscle tone, no cerebellar symptoms. Sensation intact.  Psych: Awake and oriented X 3, normal affect, Insight and Judgment appropriate.    Assessment:  Timothy Browning was seen today for hospitalization follow-up.  Diagnoses and all orders for this visit:  Hospital discharge follow-up Retrieved from hospital discharge  Schedule an appointment with [facility] Schedule an appointment with  [facility]  Encounter to establish care Establish care with PCP   Obesity, morbid (HCC) Obesity is 30-39 indicating an excess in caloric intake or underlining conditions. This may lead to other co-morbidities. Lifestyle modifications of diet and exercise may reduce obesity.    This note has been created with Education officer, environmental. Any transcriptional errors are unintentional.   Grayce Sessions, NP 08/06/2021, 4:28 PM

## 2021-08-06 NOTE — Patient Instructions (Signed)
Cholecystitis Cholecystitis is inflammation of the gallbladder. It is often called a gallbladder attack. The gallbladder is a pear-shaped organ that lies beneath the liver on the right side of the body. The gallbladder stores bile, which is a fluid that helps the body digest fats. If bile builds up in your gallbladder, your gallbladder becomes inflamed. This condition may occur suddenly. Cholecystitis is a serious condition and requires treatment. What are the causes? The most common cause of this condition is gallstones. Gallstones can block the tube (duct) that carries bile out of your gallbladder. This causes bile to build up. Other causes include: Damage to the gallbladder due to a decrease in blood flow. Infections in the bile ducts. Scars or kinks in the bile ducts. Tumors in the liver, pancreas, or gallbladder. What increases the risk? You are more likely to develop this condition if: You have sickle cell disease. You take birth control pills or use estrogen. You have alcoholic liver disease. You have liver cirrhosis. You have your nutrition delivered through a vein (parenteral nutrition). You are critically ill. You do not eat or drink for a long time. This is also called "fasting." You are obese. You lose weight too fast. You are pregnant. You have high levels of fat (triglycerides) in the blood. You have pancreatitis. What are the signs or symptoms? Symptoms of this condition include: Pain in the abdomen, especially in the upper right area of the abdomen. Tenderness or bloating in the abdomen. Nausea. Vomiting. Fever. Chills. How is this diagnosed? This condition is diagnosed with a medical history and physical exam. You may also have other tests, including: Imaging tests, such as: An ultrasound of the gallbladder. A CT scan of the abdomen. A gallbladder nuclear scan (HIDA scan). This scan allows your health care provider to see the bile moving from your liver to your  gallbladder and on to your small intestine. MRI. Blood tests, such as: A complete blood count. The white blood cell count may be higher than normal. Liver function tests. Certain types of gallstones cause some results to be higher than normal. How is this treated? Treatment may include: Surgery to remove your gallbladder (cholecystectomy). Antibiotic medicine, usually through an IV. Fasting for a certain amount of time. Giving IV fluids. Medicine to treat pain or vomiting. Follow these instructions at home: If you had surgery, follow instructions from your health care provider about home care after the procedure. Medicines  Take over-the-counter and prescription medicines only as told by your health care provider. If you were prescribed an antibiotic medicine, take it as told by your health care provider. Do not stop taking the antibiotic even if you start to feel better. General instructions Follow instructions from your health care provider about what to eat or drink. When you are allowed to eat, avoid eating or drinking anything that triggers your symptoms. Do not lift anything that is heavier than 10 lb (4.5 kg), or the limit that you are told, until your health care provider says that it is safe. Do not use any products that contain nicotine or tobacco, such as cigarettes and e-cigarettes. If you need help quitting, ask your health care provider. Keep all follow-up visits as told by your health care provider. This is important. Contact a health care provider if: Your pain is not controlled with medicine. You have a fever. Get help right away if: Your pain moves to another part of your abdomen or to your back. You continue to have symptoms or you  develop new symptoms even with treatment. Summary Cholecystitis is inflammation of the gallbladder. The most common cause of this condition is gallstones. Gallstones can block the tube (duct) that carries bile out of your  gallbladder. Common symptoms are pain in the abdomen, nausea, vomiting, fever, and chills. This condition is treated with surgery to remove the gallbladder, medicines, fasting, and IV fluids. Follow your health care provider's instructions for eating and drinking. Avoid eating anything that triggers your symptoms. This information is not intended to replace advice given to you by your health care provider. Make sure you discuss any questions you have with your health care provider. Document Revised: 02/10/2018 Document Reviewed: 02/11/2018 Elsevier Patient Education  2022 [employer].

## 2021-08-11 ENCOUNTER — Ambulatory Visit (INDEPENDENT_AMBULATORY_CARE_PROVIDER_SITE_OTHER): Payer: Self-pay | Admitting: Cardiology

## 2021-08-11 ENCOUNTER — Other Ambulatory Visit: Payer: Self-pay

## 2021-08-11 ENCOUNTER — Ambulatory Visit (INDEPENDENT_AMBULATORY_CARE_PROVIDER_SITE_OTHER): Payer: Self-pay

## 2021-08-11 ENCOUNTER — Encounter: Payer: Self-pay | Admitting: Cardiology

## 2021-08-11 VITALS — BP 132/82 | HR 76 | Ht 73.0 in | Wt 232.6 lb

## 2021-08-11 DIAGNOSIS — R002 Palpitations: Secondary | ICD-10-CM

## 2021-08-11 DIAGNOSIS — R079 Chest pain, unspecified: Secondary | ICD-10-CM | POA: Insufficient documentation

## 2021-08-11 DIAGNOSIS — I10 Essential (primary) hypertension: Secondary | ICD-10-CM | POA: Insufficient documentation

## 2021-08-11 DIAGNOSIS — E669 Obesity, unspecified: Secondary | ICD-10-CM

## 2021-08-11 DIAGNOSIS — N183 Chronic kidney disease, stage 3 unspecified: Secondary | ICD-10-CM | POA: Insufficient documentation

## 2021-08-11 DIAGNOSIS — R030 Elevated blood-pressure reading, without diagnosis of hypertension: Secondary | ICD-10-CM

## 2021-08-11 HISTORY — DX: Obesity, unspecified: E66.9

## 2021-08-11 HISTORY — DX: Chest pain, unspecified: R07.9

## 2021-08-11 HISTORY — DX: Essential (primary) hypertension: I10

## 2021-08-11 NOTE — Patient Instructions (Addendum)
Medication Instructions:  Your physician recommends that you continue on your current medications as directed. Please refer to the Current Medication list given to you today.  *If you need a refill on your cardiac medications before your next appointment, please call your pharmacy*   Lab Work: None If you have labs (blood work) drawn today and your tests are completely normal, you will receive your results only by: [place] (if you have MyChart) OR A paper copy in the mail If you have any lab test that is abnormal or we need to change your treatment, we will call you to review the results.   Testing/Procedures: [place]- Long Term Monitor Instructions  Your physician has requested you wear a ZIO patch monitor for 7 days.  This is a single patch monitor. [employer] supplies one patch monitor per enrollment. Additional stickers are not available. Please do not apply patch if you will be having a Nuclear Stress Test,  Echocardiogram, Cardiac CT, MRI, or Chest Xray during the period you would be wearing the  monitor. The patch cannot be worn during these tests. You cannot remove and re-apply the  ZIO XT patch monitor.  Your ZIO patch monitor will be mailed 3 day USPS to your address on file. It may take 3-5 days  to receive your monitor after you have been enrolled.  Once you have received your monitor, please review the enclosed instructions. Your monitor  has already been registered assigning a specific monitor serial # to you.  Billing and Patient Assistance Program Information  We have supplied [employer] with any of your insurance information on file for billing purposes. [employer] offers a sliding scale Patient Assistance Program for patients that do not have  insurance, or whose insurance does not completely cover the cost of the ZIO monitor.  You must apply for the Patient Assistance Program to qualify for this discounted rate.  To apply, please call [employer] at [phone], select  option 4, select option 2, ask to apply for  Patient Assistance Program. [name] will ask your household income, and how many people  are in your household. They will quote your out-of-pocket cost based on that information.  [employer] will also be able to set up a 22-month interest-free payment plan if needed.  Applying the monitor   Shave hair from upper left chest.  Hold abrader disc by orange tab. Rub abrader in 40 strokes over the upper left chest as  indicated in your monitor instructions.  Clean area with 4 enclosed alcohol pads. Let dry.  Apply patch as indicated in monitor instructions. Patch will be placed under collarbone on left  side of chest with arrow pointing upward.  Rub patch adhesive wings for 2 minutes. Remove white label marked "1". Remove the white  label marked "2". Rub patch adhesive wings for 2 additional minutes.  While looking in a mirror, press and release button in center of patch. A small green light will  flash 3-4 times. This will be your only indicator that the monitor has been turned on.  Do not shower for the first 24 hours. You may shower after the first 24 hours.  Press the button if you feel a symptom. You will hear a small click. Record Date, Time and  Symptom in the Patient Logbook.  When you are ready to remove the patch, follow instructions on the last 2 pages of Patient  Logbook. Stick patch monitor onto the last page of Patient Logbook.  Place Patient Logbook in  the blue and white box. Use locking tab on box and tape box closed  securely. The blue and white box has prepaid postage on it. Please place it in the mailbox as  soon as possible. Your physician should have your test results approximately 7 days after the  monitor has been mailed back to [facility].  Call [facility] Customer Care at [phone] if you have questions regarding  your ZIO XT patch monitor. Call them immediately if you see an orange light blinking on your  monitor.   If your monitor falls off in less than 4 days, contact our Monitor department at [phone].  If your monitor becomes loose or falls off after 4 days call [employer] at [phone] for  suggestions on securing your monitor    Follow-Up: At [facility], you and your health needs are our priority.  As part of our continuing mission to provide you with exceptional heart care, we have created designated Provider Care Teams.  These Care Teams include your primary Cardiologist (physician) and Advanced Practice Providers (APPs -  Physician Assistants and Nurse Practitioners) who all work together to provide you with the care you need, when you need it.  We recommend signing up for the patient portal called "MyChart".  Sign up information is provided on this After Visit Summary.  MyChart is used to connect with patients for Virtual Visits (Telemedicine).  Patients are able to view lab/test results, encounter notes, upcoming appointments, etc.  Non-urgent messages can be sent to your provider as well.   To learn more about what you can do with MyChart, go to [email or web address].    Your next appointment:   1 year   The format for your next appointment:   In Person  Provider:   Thomasene Ripple, DO [address], [place], [place] [address]    Other Instructions

## 2021-08-11 NOTE — Progress Notes (Unsigned)
Patient enrolled for [employer] to mail a 7 day ZIO XT monitor to his address on file. 
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
## 2021-08-18 DIAGNOSIS — R002 Palpitations: Secondary | ICD-10-CM

## 2021-09-13 IMAGING — CR DG CHEST 2V
2 series · 2 of 2 positions shown · non-contrast
Comparison: 12/29/2016

CLINICAL DATA: Chest pain, onset yesterday.

EXAM:
CHEST - 2 VIEW

[chest pa]
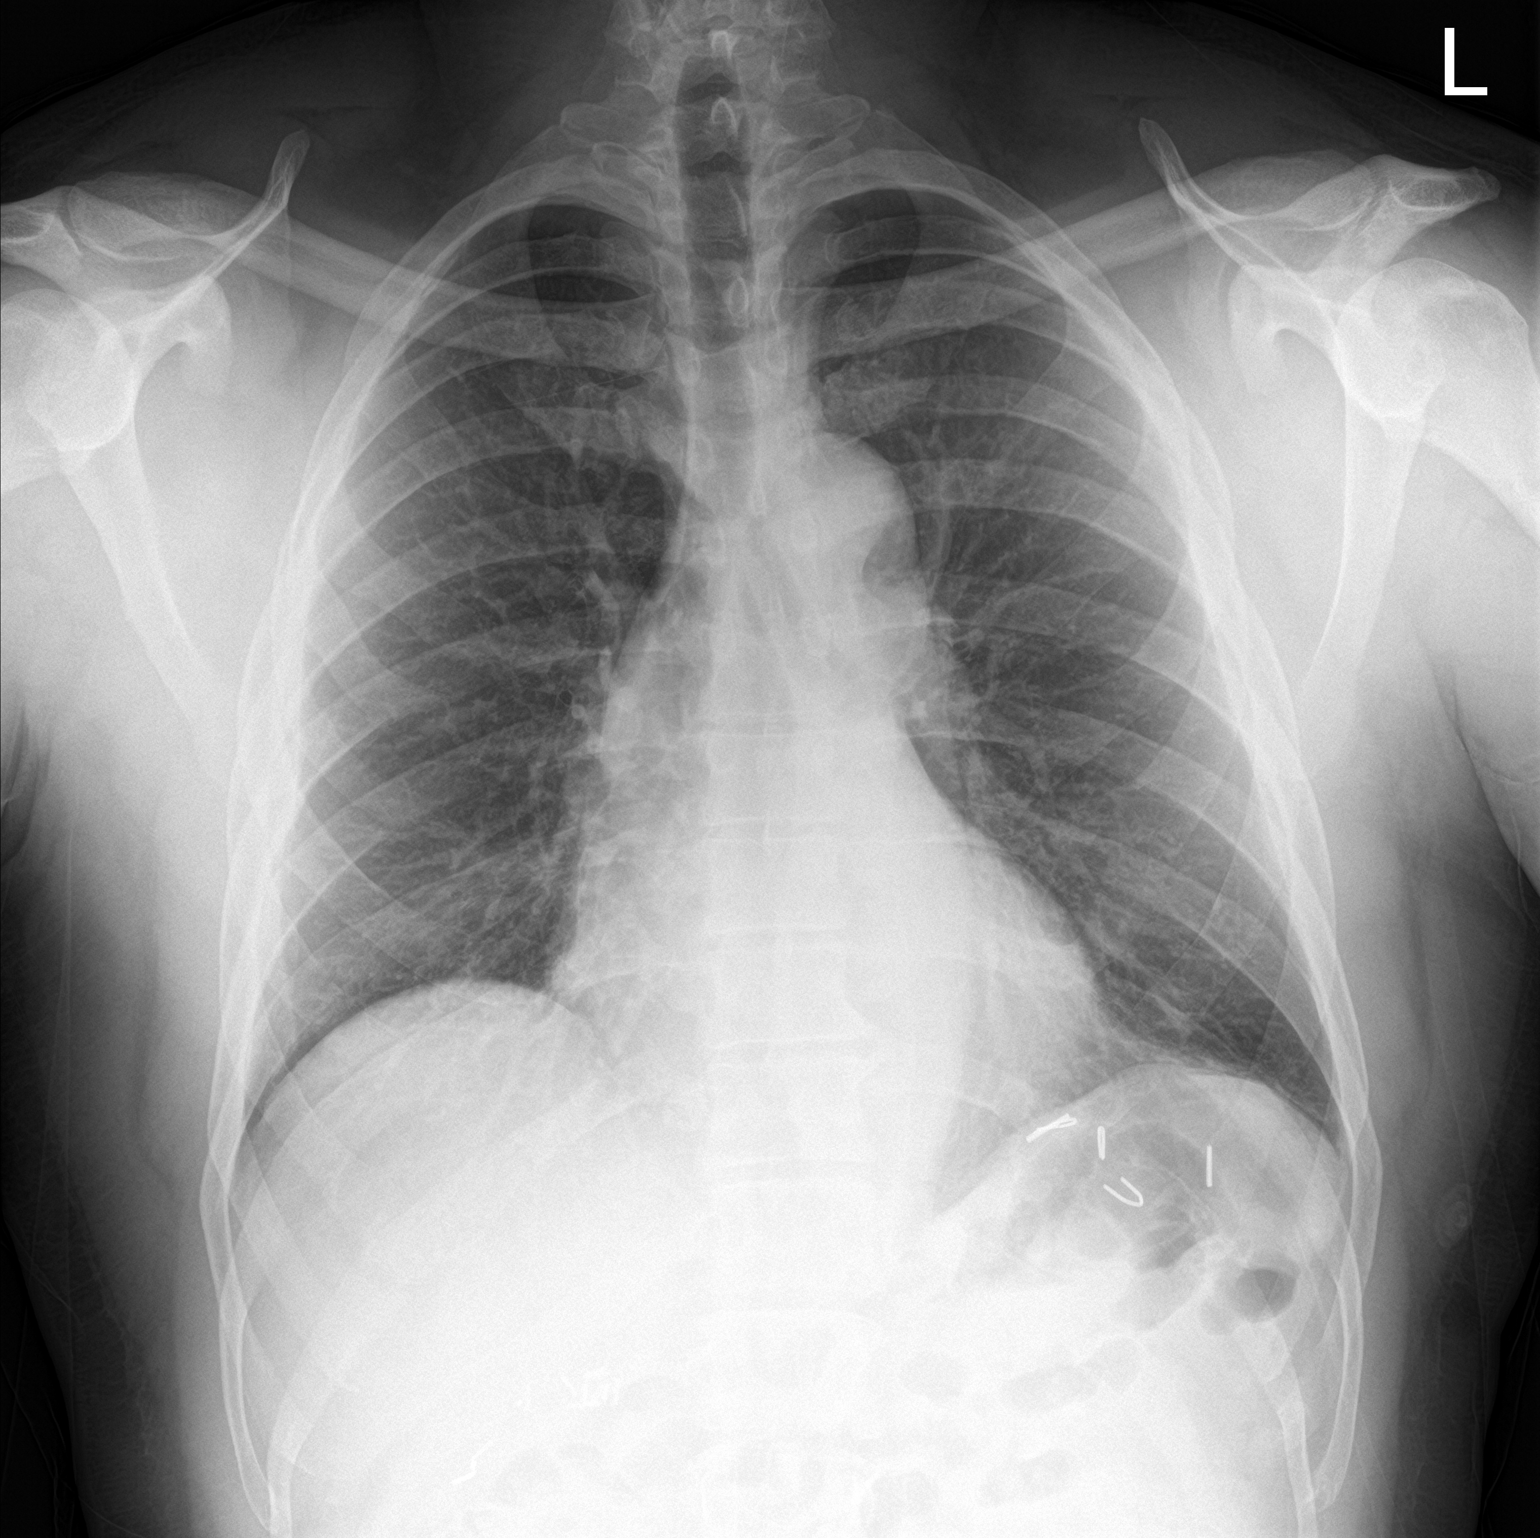

[chest lat]
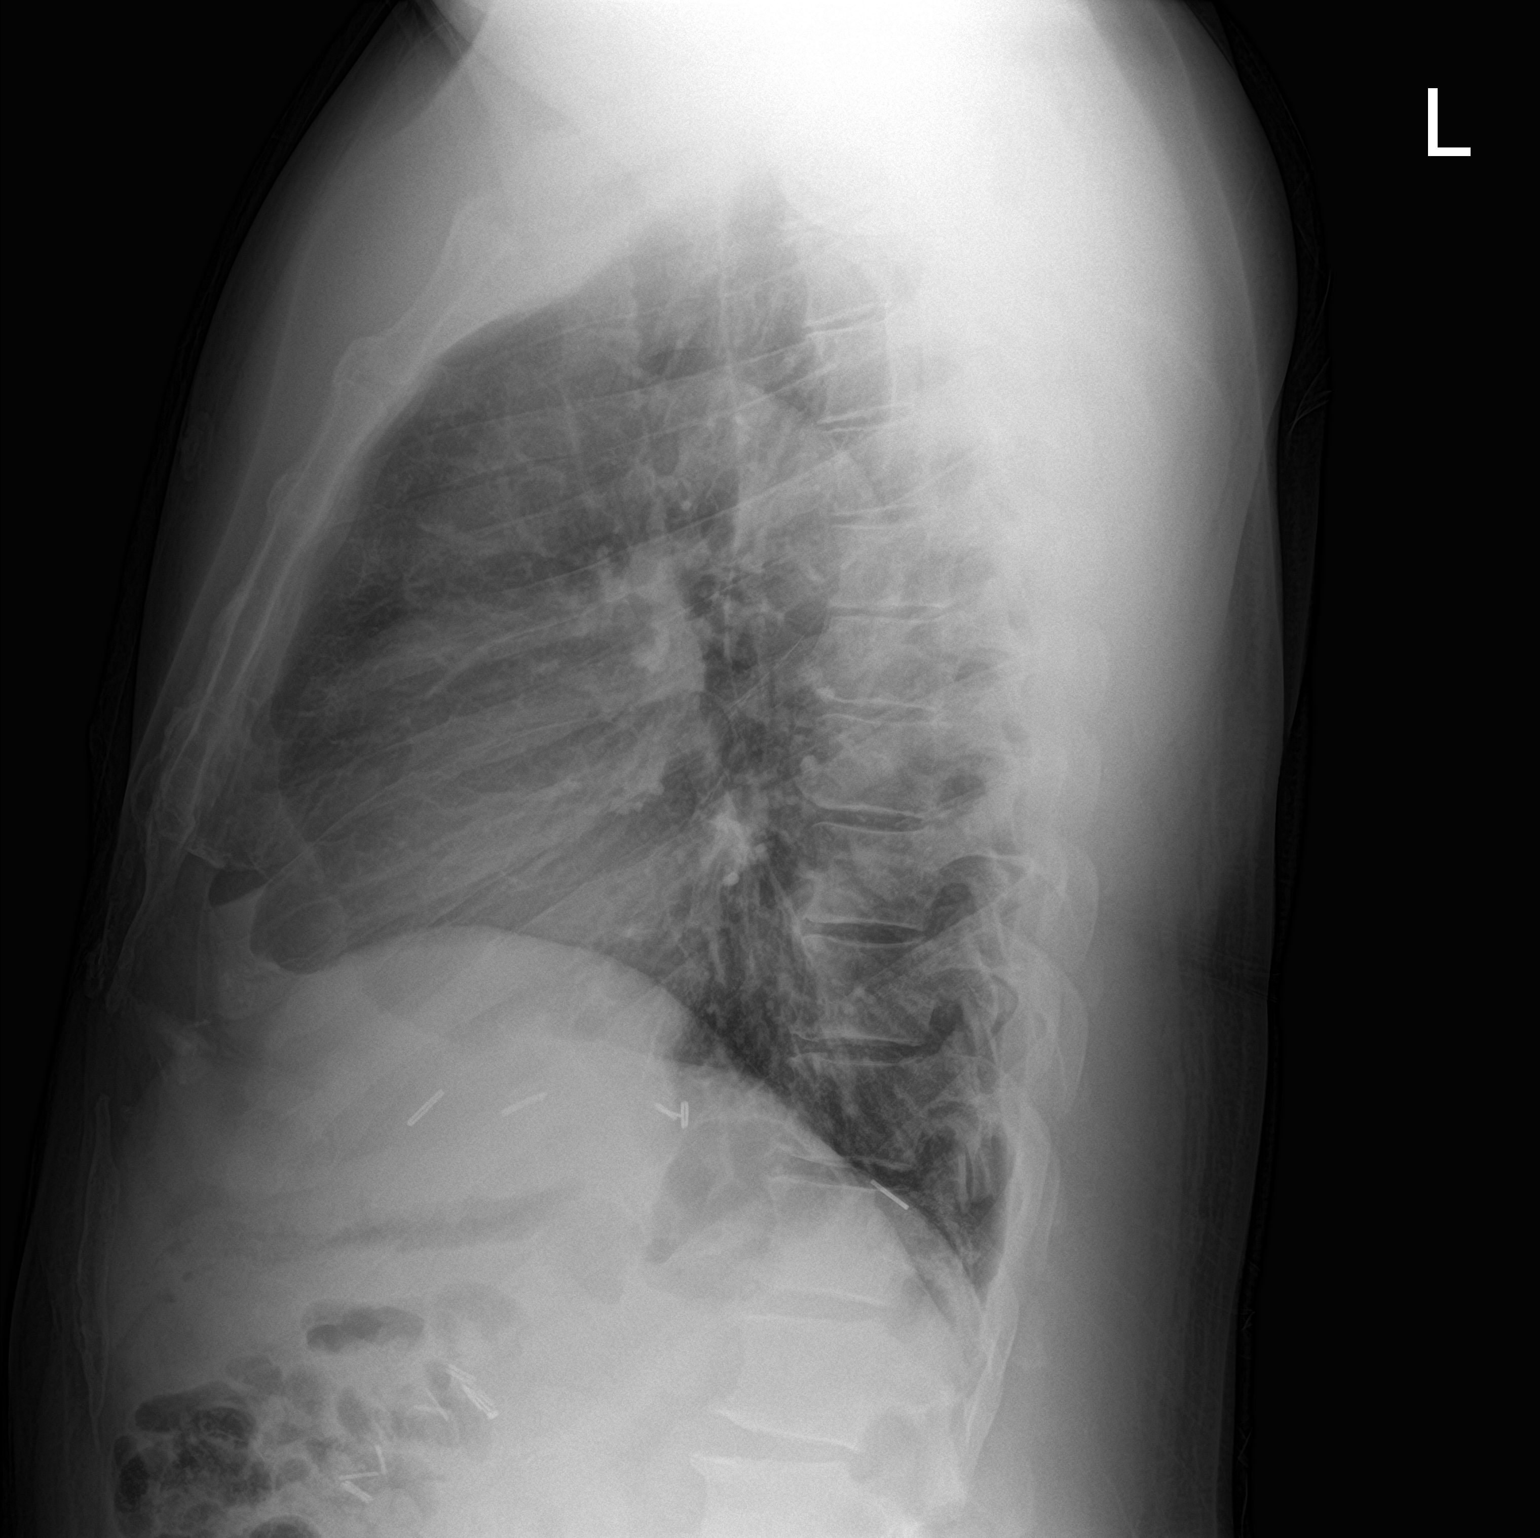

[2 of 2 positions shown; findings below may reference images not displayed]

FINDINGS: The cardiomediastinal contours are normal. Minor subsegmental
atelectasis at the left lung base. Pulmonary vasculature is normal.
No consolidation, pleural effusion, or pneumothorax. No acute
osseous abnormalities are seen. Surgical clips in the left upper
abdomen.
IMPRESSION: Minor subsegmental atelectasis at the left lung base.

## 2022-07-02 ENCOUNTER — Ambulatory Visit: Payer: Medicaid Other | Admitting: Student

## 2022-07-02 ENCOUNTER — Encounter: Payer: Self-pay | Admitting: Student

## 2022-07-02 ENCOUNTER — Ambulatory Visit (INDEPENDENT_AMBULATORY_CARE_PROVIDER_SITE_OTHER): Payer: Self-pay | Admitting: Internal Medicine

## 2022-07-02 ENCOUNTER — Other Ambulatory Visit: Payer: Self-pay

## 2022-07-02 VITALS — BP 141/93 | HR 88 | Temp 98.0°F | Ht 72.0 in | Wt 234.8 lb

## 2022-07-02 DIAGNOSIS — N183 Chronic kidney disease, stage 3 unspecified: Secondary | ICD-10-CM

## 2022-07-02 DIAGNOSIS — Z9081 Acquired absence of spleen: Secondary | ICD-10-CM

## 2022-07-02 DIAGNOSIS — R030 Elevated blood-pressure reading, without diagnosis of hypertension: Secondary | ICD-10-CM

## 2022-07-02 DIAGNOSIS — R42 Dizziness and giddiness: Secondary | ICD-10-CM

## 2022-07-02 DIAGNOSIS — R002 Palpitations: Secondary | ICD-10-CM

## 2022-07-02 DIAGNOSIS — M21371 Foot drop, right foot: Secondary | ICD-10-CM

## 2022-07-02 NOTE — Patient Instructions (Addendum)
Thank you, Mr.Carrington L Mcnew for allowing [place] to provide your care today.   Heart I am getting another heart monitor to work-up why you are continuing to have fluttering. I am also getting some labs today and will call you with results.   Blood pressure Your blood pressure was elevated today. With your dizziness I do not want to start a blood pressure medication. Please check blood pressure and home and bring log in at follow-up in 4 weeks.  Please complete orange card paperwork ASAP and when you come back we will be able to do more for you.    I have ordered the following labs for you:  Lab Orders         BMP8+Anion Gap         Magnesium      Referrals ordered today:   Referral Orders  No referral(s) requested today     I have ordered the following medication/changed the following medications:   Stop the following medications: There are no discontinued medications.   Start the following medications: No orders of the defined types were placed in this encounter.    Follow up:  4 weeks    We look forward to seeing you next time. Please call our clinic at [phone] if you have any questions or concerns. The best time to call is Monday-Friday from 9am-4pm, but there is someone available 24/7. If after hours or the weekend, call the main hospital number and ask for the Internal Medicine Resident On-Call. If you need medication refills, please notify your pharmacy one week in advance and they will send [place] a request.   Thank you for trusting me with your care. Wishing you the best!   Rudene Christians, DO [facility]
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
## 2022-07-02 NOTE — Progress Notes (Unsigned)
51 year old male with history of cholecystectomy, CKD stage III of uncertain etiology, and obesity presents to establish care with this clinic.  Palpitations  Stage 3 chronic kidney disease, unspecified whether stage 3a or 3b CKD (HCC)  COVID-vaccine HIV screening HCV screening Colonoscopy Shingles Flu

## 2022-07-03 ENCOUNTER — Encounter: Payer: Self-pay | Admitting: Internal Medicine

## 2022-07-03 DIAGNOSIS — Z9081 Acquired absence of spleen: Secondary | ICD-10-CM | POA: Insufficient documentation

## 2022-07-03 DIAGNOSIS — R42 Dizziness and giddiness: Secondary | ICD-10-CM | POA: Insufficient documentation

## 2022-07-03 LAB — BMP8+ANION GAP
Anion Gap: 15 mmol/L (ref 10.0–18.0)
BUN/Creatinine Ratio: 17 (ref 9–20)
BUN: 22 mg/dL (ref 6–24)
CO2: 21 mmol/L (ref 20–29)
Calcium: 9.3 mg/dL (ref 8.7–10.2)
Chloride: 102 mmol/L (ref 96–106)
Creatinine, Ser: 1.3 mg/dL — ABNORMAL HIGH (ref 0.76–1.27)
Glucose: 90 mg/dL (ref 70–99)
Potassium: 4.5 mmol/L (ref 3.5–5.2)
Sodium: 138 mmol/L (ref 134–144)
eGFR: 67 mL/min/{1.73_m2} (ref >59)

## 2022-07-03 LAB — MAGNESIUM: Magnesium: 2.3 mg/dL (ref 1.6–2.3)

## 2022-07-03 NOTE — Assessment & Plan Note (Signed)
Blood pressure elevated in clinic. Orthostatics completed and sitting BP at 129/96. He denies headache, chest pain, shortness of breath. P: I talked with patient about monitoring blood pressure at home and bringing log in at follow-up in 4 weeks.

## 2022-07-03 NOTE — Assessment & Plan Note (Addendum)
History of palpitations for the past year. He was seen in ED 9/22 for atypical chest pain. Work-up with troponin, ekg negative. He was referred to Dr. Servando Salina with Cardiology. 14 day heart monitor was completed and no abnormalities were recorded. She planned to complete CTA with calcium score as he is intermittent risk of heart disease. However this was not completed. He continues to have intermittent palpitations with fluttering 1-3 times weekly. He denies chest pain or shortness of breath with episodes.  P: Zio patch, I called and talked with cards master to have this mailed to him. Patient plans to complete orange card paperwork, but would prefer to present with additional work-up prior to this. -Echo -Follow-up in 4 weeks
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
## 2022-07-03 NOTE — Assessment & Plan Note (Signed)
Creatinine around 1.2-1.5 over the last several years. GFR at 54 9/22. No history of diabetes and he is not on blood pressure medications.  P: Repeat BMP shows creatinine 1.3 with GFR at 67.  -continue to monitor for now, would repeat BMP in 3/23.

## 2022-07-03 NOTE — Assessment & Plan Note (Signed)
He continues to have episodes of dizziness over the last year. They occur when he stands up quickly or leans to to get something and sit up quickly. Palpitation episode are not associated with dizzy spells. They resolve within a few seconds. He denies chest pain and shortness of breath. Orthostatics negative with: BP 135/91 HR 82 Lying BP 129/96 HR 88 sitting BP 133/106 HR 97 stand 0 min BP 141/103 HR 96 stand 3 min A/P: Unclear cause of dizziness. Presentation sounds consistent with orthostatic hypotension however negative in clinic. He could be having arrhythmia leading to symptoms, but it is interesting that palpitations are not associated with dizzy spells. -Zio patch -Echo

## 2022-07-03 NOTE — Assessment & Plan Note (Signed)
>>  ASSESSMENT AND PLAN FOR ELEVATED BLOOD PRESSURE READING WRITTEN ON 07/03/2022  7:26 AM BY MASTERS, KATIE, DO  Blood pressure elevated in clinic. Orthostatics completed and sitting BP at 129/96. He denies headache, chest pain, shortness of breath. P: I talked with patient about monitoring blood pressure at home and bringing log in at follow-up in 4 weeks.

## 2022-07-06 NOTE — Progress Notes (Signed)
Internal Medicine Clinic Attending  Case discussed with Dr. Masters  At the time of the visit.  We reviewed the resident's history and exam and pertinent patient test results.  I agree with the assessment, diagnosis, and plan of care documented in the resident's note.  

## 2022-07-14 ENCOUNTER — Telehealth: Payer: Self-pay | Admitting: Internal Medicine

## 2022-07-14 ENCOUNTER — Encounter (HOSPITAL_COMMUNITY): Payer: Self-pay

## 2022-07-14 ENCOUNTER — Emergency Department (HOSPITAL_COMMUNITY): Payer: Medicaid Other

## 2022-07-14 ENCOUNTER — Other Ambulatory Visit: Payer: Self-pay

## 2022-07-14 ENCOUNTER — Ambulatory Visit (HOSPITAL_COMMUNITY)
Admission: RE | Admit: 2022-07-14 | Discharge: 2022-07-14 | Disposition: A | Discharge status: Home / Self Care | Payer: Medicaid Other | Source: Ambulatory Visit | Attending: Internal Medicine | Admitting: Internal Medicine

## 2022-07-14 ENCOUNTER — Inpatient Hospital Stay (HOSPITAL_COMMUNITY)
Admission: EM | Admit: 2022-07-14 | Discharge: 2022-07-15 | DRG: 287 | Disposition: A | Discharge status: Home / Self Care | Payer: Medicaid Other | Attending: Internal Medicine | Admitting: Internal Medicine

## 2022-07-14 DIAGNOSIS — I5020 Unspecified systolic (congestive) heart failure: Secondary | ICD-10-CM | POA: Diagnosis present

## 2022-07-14 DIAGNOSIS — Z9081 Acquired absence of spleen: Secondary | ICD-10-CM

## 2022-07-14 DIAGNOSIS — N529 Male erectile dysfunction, unspecified: Secondary | ICD-10-CM | POA: Diagnosis present

## 2022-07-14 DIAGNOSIS — K219 Gastro-esophageal reflux disease without esophagitis: Secondary | ICD-10-CM | POA: Insufficient documentation

## 2022-07-14 DIAGNOSIS — I130 Hypertensive heart and chronic kidney disease with heart failure and stage 1 through stage 4 chronic kidney disease, or unspecified chronic kidney disease: Secondary | ICD-10-CM | POA: Diagnosis present

## 2022-07-14 DIAGNOSIS — Z560 Unemployment, unspecified: Secondary | ICD-10-CM | POA: Diagnosis not present

## 2022-07-14 DIAGNOSIS — N1831 Chronic kidney disease, stage 3a: Secondary | ICD-10-CM | POA: Diagnosis present

## 2022-07-14 DIAGNOSIS — R002 Palpitations: Secondary | ICD-10-CM | POA: Diagnosis present

## 2022-07-14 DIAGNOSIS — R079 Chest pain, unspecified: Secondary | ICD-10-CM

## 2022-07-14 DIAGNOSIS — I351 Nonrheumatic aortic (valve) insufficiency: Secondary | ICD-10-CM | POA: Insufficient documentation

## 2022-07-14 DIAGNOSIS — I719 Aortic aneurysm of unspecified site, without rupture: Secondary | ICD-10-CM | POA: Diagnosis present

## 2022-07-14 DIAGNOSIS — R5383 Other fatigue: Secondary | ICD-10-CM

## 2022-07-14 DIAGNOSIS — I5089 Other heart failure: Secondary | ICD-10-CM | POA: Insufficient documentation

## 2022-07-14 DIAGNOSIS — I77819 Aortic ectasia, unspecified site: Secondary | ICD-10-CM | POA: Insufficient documentation

## 2022-07-14 DIAGNOSIS — I7121 Aneurysm of the ascending aorta, without rupture: Secondary | ICD-10-CM | POA: Diagnosis present

## 2022-07-14 DIAGNOSIS — I371 Nonrheumatic pulmonary valve insufficiency: Secondary | ICD-10-CM | POA: Insufficient documentation

## 2022-07-14 DIAGNOSIS — R008 Other abnormalities of heart beat: Secondary | ICD-10-CM

## 2022-07-14 DIAGNOSIS — R42 Dizziness and giddiness: Secondary | ICD-10-CM

## 2022-07-14 LAB — CBC WITH DIFFERENTIAL/PLATELET
Abs Immature Granulocytes: 0.02 10*3/uL (ref 0.00–0.07)
Basophils Absolute: 0 10*3/uL (ref 0.0–0.1)
Basophils Relative: 1 %
Eosinophils Absolute: 0 10*3/uL (ref 0.0–0.5)
Eosinophils Relative: 1 %
HCT: 43.4 % (ref 39.0–52.0)
Hemoglobin: 15.1 g/dL (ref 13.0–17.0)
Immature Granulocytes: 0 %
Lymphocytes Relative: 35 %
Lymphs Abs: 2 10*3/uL (ref 0.7–4.0)
MCH: 31.7 pg (ref 26.0–34.0)
MCHC: 34.8 g/dL (ref 30.0–36.0)
MCV: 91.2 fL (ref 80.0–100.0)
Monocytes Absolute: 0.6 10*3/uL (ref 0.1–1.0)
Monocytes Relative: 10 %
Neutro Abs: 3 10*3/uL (ref 1.7–7.7)
Neutrophils Relative %: 53 %
Platelets: 315 10*3/uL (ref 150–400)
RBC: 4.76 MIL/uL (ref 4.22–5.81)
RDW: 13.2 % (ref 11.5–15.5)
WBC: 5.6 10*3/uL (ref 4.0–10.5)
nRBC: 0 % (ref 0.0–0.2)

## 2022-07-14 LAB — COMPREHENSIVE METABOLIC PANEL
ALT: 22 U/L (ref 0–44)
AST: 20 U/L (ref 15–41)
Albumin: 3.6 g/dL (ref 3.5–5.0)
Alkaline Phosphatase: 62 U/L (ref 38–126)
Anion gap: 6 (ref 5–15)
BUN: 17 mg/dL (ref 6–20)
CO2: 25 mmol/L (ref 22–32)
Calcium: 9 mg/dL (ref 8.9–10.3)
Chloride: 105 mmol/L (ref 98–111)
Creatinine, Ser: 1.44 mg/dL — ABNORMAL HIGH (ref 0.61–1.24)
GFR, Estimated: 59 mL/min — ABNORMAL LOW (ref >60)
Glucose, Bld: 125 mg/dL — ABNORMAL HIGH (ref 70–99)
Potassium: 3.5 mmol/L (ref 3.5–5.1)
Sodium: 136 mmol/L (ref 135–145)
Total Bilirubin: 0.9 mg/dL (ref 0.3–1.2)
Total Protein: 6.8 g/dL (ref 6.5–8.1)

## 2022-07-14 LAB — TROPONIN I (HIGH SENSITIVITY)
Troponin I (High Sensitivity): 7 ng/L (ref <18)
Troponin I (High Sensitivity): 8 ng/L (ref <18)

## 2022-07-14 LAB — PROTIME-INR
INR: 1 (ref 0.8–1.2)
Prothrombin Time: 13 seconds (ref 11.4–15.2)

## 2022-07-14 LAB — ECHOCARDIOGRAM COMPLETE
AV Vena cont: 0.2 cm
Area-P 1/2: 3.72 cm2
Calc EF: 48.4 %
P 1/2 time: 415 msec
S' Lateral: 3.7 cm
Single Plane A2C EF: 46.7 %
Single Plane A4C EF: 51.7 %

## 2022-07-14 LAB — LIPID PANEL
Cholesterol: 203 mg/dL — ABNORMAL HIGH (ref 0–200)
HDL: 57 mg/dL (ref >40)
LDL Cholesterol: 132 mg/dL — ABNORMAL HIGH (ref 0–99)
Total CHOL/HDL Ratio: 3.6 RATIO
Triglycerides: 70 mg/dL (ref <150)
VLDL: 14 mg/dL (ref 0–40)

## 2022-07-14 LAB — MAGNESIUM: Magnesium: 1.8 mg/dL (ref 1.7–2.4)

## 2022-07-14 LAB — HEMOGLOBIN A1C
Hgb A1c MFr Bld: 5.2 % (ref 4.8–5.6)
Mean Plasma Glucose: 102.54 mg/dL

## 2022-07-14 LAB — TSH: TSH: 2.196 u[IU]/mL (ref 0.350–4.500)

## 2022-07-14 MED ORDER — FUROSEMIDE 10 MG/ML IJ SOLN
20.0000 mg | Freq: Once | INTRAMUSCULAR | Status: AC
  Administered 2022-07-14: 20 mg via INTRAVENOUS
  Filled 2022-07-14: qty 2

## 2022-07-14 MED ORDER — ENOXAPARIN SODIUM 40 MG/0.4ML IJ SOSY
40.0000 mg | PREFILLED_SYRINGE | INTRAMUSCULAR | Status: DC
  Filled 2022-07-14: qty 0.4

## 2022-07-14 MED ORDER — IOHEXOL 350 MG/ML SOLN
75.0000 mL | Freq: Once | INTRAVENOUS | Status: AC | PRN
  Administered 2022-07-14: 75 mL via INTRAVENOUS

## 2022-07-14 MED ORDER — SENNOSIDES-DOCUSATE SODIUM 8.6-50 MG PO TABS: 1.0000 | ORAL_TABLET | Freq: Every evening | ORAL | Status: DC | PRN

## 2022-07-14 MED ORDER — ONDANSETRON HCL 4 MG PO TABS: 4.0000 mg | ORAL_TABLET | Freq: Four times a day (QID) | ORAL | Status: DC | PRN

## 2022-07-14 MED ORDER — ACETAMINOPHEN 325 MG PO TABS: 650.0000 mg | ORAL_TABLET | Freq: Four times a day (QID) | ORAL | Status: DC | PRN

## 2022-07-14 MED ORDER — ONDANSETRON HCL 4 MG/2ML IJ SOLN: 4.0000 mg | Freq: Four times a day (QID) | INTRAMUSCULAR | Status: DC | PRN

## 2022-07-14 MED ORDER — ACETAMINOPHEN 650 MG RE SUPP: 650.0000 mg | Freq: Four times a day (QID) | RECTAL | Status: DC | PRN

## 2022-07-14 NOTE — Progress Notes (Signed)
Echocardiogram 2D Echocardiogram has been performed.  Fidel Levy 07/14/2022, 9:38 AM

## 2022-07-14 NOTE — ED Provider Notes (Signed)
[facility] EMERGENCY DEPARTMENT Provider Note   CSN: [id number] Arrival date & time: 07/14/22  1552     History  Chief Complaint  Patient presents with   Chest Pain    Timothy Browning is a 51 y.o. male.  HPI Patient presents with concern of palpitations, dyspnea, particular with exertion.  Notably patient was at echocardiogram which was performed today after he was seen and evaluated for these concerns by his primary care clinic a few days ago.  Echocardiogram noted to be abnormal, with concern for dilated aortic root, patient here for evaluation.   As above symptoms are slightly worse with exertion, but now, resting, patient has minimal complaints, no numbness in either hand.   Home Medications Prior to Admission medications   Medication Sig Start Date End Date Taking? Authorizing Provider  sildenafil (VIAGRA) 25 MG tablet Take 100 mg by mouth daily as needed.    [provider]      Allergies    Patient has no known allergies.    Review of Systems   Review of Systems  All other systems reviewed and are negative.   Physical Exam Updated Vital Signs BP (!) 129/93   Pulse 85   Temp 98.8 F (37.1 C) (Oral)   Resp 19   Ht 6' (1.829 m)   Wt 106.1 kg   SpO2 96%   BMI 31.74 kg/m  Physical Exam Vitals and nursing note reviewed.  Constitutional:      General: He is not in acute distress.    Appearance: He is well-developed.  HENT:     Head: Normocephalic and atraumatic.  Eyes:     Conjunctiva/sclera: Conjunctivae normal.  Cardiovascular:     Rate and Rhythm: Normal rate and regular rhythm.     Pulses: Normal pulses.     Heart sounds: Normal heart sounds.     Comments: Pulses symmetric 2+ both radial Pulmonary:     Effort: Pulmonary effort is normal. No respiratory distress.     Breath sounds: No stridor.  Abdominal:     General: There is no distension.  Skin:    General: Skin is warm and dry.  Neurological:     Mental Status:  He is alert and oriented to person, place, and time.     ED Results / Procedures / Treatments   Labs (all labs ordered are listed, but only abnormal results are displayed) Labs Reviewed  COMPREHENSIVE METABOLIC PANEL - Abnormal; Notable for the following components:      Result Value   Glucose, Bld 125 (*)    Creatinine, Ser 1.44 (*)    GFR, Estimated 59 (*)    All other components within normal limits  CBC WITH DIFFERENTIAL/PLATELET  PROTIME-INR  HIV ANTIBODY (ROUTINE TESTING W REFLEX)  TSH  LIPID PANEL  HEMOGLOBIN A1C  URINALYSIS, ROUTINE W REFLEX MICROSCOPIC  TROPONIN I (HIGH SENSITIVITY)  TROPONIN I (HIGH SENSITIVITY)    EKG EKG Interpretation  Date/Time:  Tuesday July 14 2022 16:23:17 EDT Ventricular Rate:  99 PR Interval:  161 QRS Duration: 99 QT Interval:  309 QTC Calculation: 397 R Axis:   -2 Text Interpretation: Sinus rhythm Probable left atrial enlargement Borderline T wave abnormalities Abnormal ECG Confirmed by Gerhard Munch [phone]) on 07/14/2022 4:55:05 PM  Radiology CT Angio Chest/Abd/Pel for Dissection W and/or Wo Contrast  Result Date: 07/14/2022 CLINICAL DATA:  Acute aortic syndrome (AAS) suspected poss dissection on ECHO today EXAM: CT ANGIOGRAPHY CHEST, ABDOMEN AND PELVIS TECHNIQUE: Non-contrast  CT of the chest was initially obtained. Multidetector CT imaging through the chest, abdomen and pelvis was performed using the standard protocol during bolus administration of intravenous contrast. Multiplanar reconstructed images and MIPs were obtained and reviewed to evaluate the vascular anatomy. RADIATION DOSE REDUCTION: This exam was performed according to the departmental dose-optimization program which includes automated exposure control, adjustment of the mA and/or kV according to patient size and/or use of iterative reconstruction technique. CONTRAST:  26mL OMNIPAQUE IOHEXOL 350 MG/ML SOLN COMPARISON:  None Available. FINDINGS: CTA CHEST FINDINGS  Cardiovascular: Aneurysmal dilatation of the aortic root measuring 5.1 cm at the sinuses of Valsalva. Ascending thoracic aorta measures 3.5 cm. Descending thoracic aorta measures 3.0 cm. No evidence of dissection. Heart is normal size. Mediastinum/Nodes: No mediastinal, hilar, or axillary adenopathy. Trachea and esophagus are unremarkable. Thyroid unremarkable. Lungs/Pleura: No confluent opacities or effusions. Musculoskeletal: Chest wall soft tissues are unremarkable. No acute bony abnormality. Review of the MIP images confirms the above findings. CTA ABDOMEN AND PELVIS FINDINGS VASCULAR Aorta: Normal caliber aorta without aneurysm, dissection, vasculitis or significant stenosis. Celiac: Patent without evidence of aneurysm, dissection, vasculitis or significant stenosis. SMA: Patent without evidence of aneurysm, dissection, vasculitis or significant stenosis. Renals: Both renal arteries are patent without evidence of aneurysm, dissection, vasculitis, fibromuscular dysplasia or significant stenosis. IMA: Patent without evidence of aneurysm, dissection, vasculitis or significant stenosis. Inflow: Patent without evidence of aneurysm, dissection, vasculitis or significant stenosis. Veins: No obvious venous abnormality within the limitations of this arterial phase study. Review of the MIP images confirms the above findings. NON-VASCULAR Hepatobiliary: No focal liver abnormality is seen. Status post cholecystectomy. No biliary dilatation. Pancreas: No focal abnormality or ductal dilatation. Spleen: Prior splenectomy. Accessory splenules noted in the left upper quadrant. Adrenals/Urinary Tract: 3 mm nonobstructing stone in the midpole of the left kidney. No right renal stones or ureteral stones bilaterally. No hydronephrosis. Adrenal glands and urinary bladder unremarkable. Stomach/Bowel: Colonic diverticulosis most pronounced in the sigmoid colon and right colon. No evidence of active diverticulitis. Stomach and small  bowel grossly unremarkable. No obstruction. Lymphatic: No adenopathy Reproductive: No visible focal abnormality. Other: No free fluid or free air. Musculoskeletal: Old healed posttraumatic changes in the left in the pelvis. No acute bony abnormality. Review of the MIP images confirms the above findings. 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 Recommend semi-annual imaging followup by CTA or MRA and referral to cardiothoracic surgery if not already obtained. This recommendation follows 2010 ACCF/AHA/AATS/ACR/ASA/SCA/SCAI/SIR/STS/SVM Guidelines for the Diagnosis and Management of Patients With Thoracic Aortic Disease. Circulation. 2010; 121: W102-V253. Aortic aneurysm NOS (ICD10-I71.9) No evidence of dissection. No acute cardiopulmonary disease. Colonic diverticulosis.  No active diverticulitis. Electronically Signed   By: Rolm Baptise M.D.   On: 07/14/2022 17:26   ECHOCARDIOGRAM COMPLETE  Result Date: 07/14/2022    ECHOCARDIOGRAM REPORT   Patient Name:   SAGE KOPERA Date of Exam: 07/14/2022 Medical Rec #:  [id number]       Height:       72.0 in Accession #:    [id number]      Weight:       234.8 lb Date of Birth:  [birth year]       BSA:          2.281 m Patient Age:    51 years        BP:           147/95 mmHg  Patient Gender: M               HR:           73 bpm. Exam Location:  Outpatient Procedure: 2D Echo, Cardiac Doppler and Color Doppler Indications:    Other abnormalities of the heart R00.8  History:        Patient has no prior history of Echocardiogram examinations.                 Risk Factors:GERD.  Sonographer:    Bernadene Person RDCS Referring Phys: [id number] [facility] NARENDRA IMPRESSIONS  1. Severely dilated aortic root (5.2 cm); cannot R/O dissection in aortic arch; suggest CTA or MRA to further assess. [employer] via secure chat.  2. Left ventricular ejection fraction, by estimation, is 45 to  50%. The left ventricle has mildly decreased function. The left ventricle demonstrates global hypokinesis. There is mild concentric left ventricular hypertrophy. Left ventricular diastolic parameters are consistent with Grade I diastolic dysfunction (impaired relaxation).  3. Right ventricular systolic function is normal. The right ventricular size is normal.  4. The mitral valve is normal in structure. No evidence of mitral valve regurgitation. No evidence of mitral stenosis.  5. The aortic valve is normal in structure. Aortic valve regurgitation is trivial. No aortic stenosis is present.  6. Aortic dilatation noted. There is severe dilatation of the aortic root, measuring 52 mm. There is mild dilatation of the ascending aorta, measuring 43 mm.  7. The inferior vena cava is normal in size with greater than 50% respiratory variability, suggesting right atrial pressure of 3 mmHg. Comparison(s): No prior Echocardiogram. FINDINGS  Left Ventricle: Left ventricular ejection fraction, by estimation, is 45 to 50%. The left ventricle has mildly decreased function. The left ventricle demonstrates global hypokinesis. The left ventricular internal cavity size was normal in size. There is  mild concentric left ventricular hypertrophy. Left ventricular diastolic parameters are consistent with Grade I diastolic dysfunction (impaired relaxation). Right Ventricle: The right ventricular size is normal. Right ventricular systolic function is normal. Left Atrium: Left atrial size was normal in size. Right Atrium: Right atrial size was normal in size. Pericardium: There is no evidence of pericardial effusion. Mitral Valve: The mitral valve is normal in structure. No evidence of mitral valve regurgitation. No evidence of mitral valve stenosis. Tricuspid Valve: The tricuspid valve is normal in structure. Tricuspid valve regurgitation is trivial. No evidence of tricuspid stenosis. Aortic Valve: The aortic valve is normal in structure.  Aortic valve regurgitation is trivial. Aortic regurgitation PHT measures 415 msec. No aortic stenosis is present. Pulmonic Valve: The pulmonic valve was normal in structure. Pulmonic valve regurgitation is mild. No evidence of pulmonic stenosis. Aorta: Aortic dilatation noted. There is severe dilatation of the aortic root, measuring 52 mm. There is mild dilatation of the ascending aorta, measuring 43 mm. Venous: The inferior vena cava is normal in size with greater than 50% respiratory variability, suggesting right atrial pressure of 3 mmHg. IAS/Shunts: No atrial level shunt detected by color flow Doppler. Additional Comments: Severely dilated aortic root (5.2 cm); cannot R/O dissection in aortic arch; suggest CTA or MRA to further assess. [employer] via secure chat.  LEFT VENTRICLE PLAX 2D LVIDd:         5.20 cm      Diastology LVIDs:         3.70 cm      LV e' medial:    5.93 cm/s LV PW:  1.20 cm      LV E/e' medial:  8.7 LV IVS:        1.10 cm      LV e' lateral:   6.75 cm/s LVOT diam:     2.50 cm      LV E/e' lateral: 7.6 LV SV:         66 LV SV Index:   29 LVOT Area:     4.91 cm  LV Volumes (MOD) LV vol d, MOD A2C: 141.0 ml LV vol d, MOD A4C: 114.0 ml LV vol s, MOD A2C: 75.2 ml LV vol s, MOD A4C: 55.1 ml LV SV MOD A2C:     65.8 ml LV SV MOD A4C:     114.0 ml LV SV MOD BP:      64.2 ml RIGHT VENTRICLE RV S prime:     9.00 cm/s TAPSE (M-mode): 1.3 cm LEFT ATRIUM             Index        RIGHT ATRIUM           Index LA diam:        2.70 cm 1.18 cm/m   RA Area:     16.60 cm LA Vol (A2C):   50.0 ml 21.92 ml/m  RA Volume:   44.90 ml  19.69 ml/m LA Vol (A4C):   54.0 ml 23.68 ml/m LA Biplane Vol: 52.0 ml 22.80 ml/m  AORTIC VALVE                   PULMONIC VALVE LVOT Vmax:         69.20 cm/s  PR End Diast Vel: 3.00 msec LVOT Vmean:        44.300 cm/s LVOT VTI:          0.135 m AI PHT:            415 msec AR Vena Contracta: 0.20 cm  AORTA Ao Root diam: 5.20 cm Ao Asc diam:  4.30 cm MITRAL  VALVE MV Area (PHT): 3.72 cm    SHUNTS MV Decel Time: 204 msec    Systemic VTI:  0.14 m MV E velocity: 51.60 cm/s  Systemic Diam: 2.50 cm MV A velocity: 50.10 cm/s MV E/A ratio:  1.03 Kirk Ruths MD Electronically signed by Kirk Ruths MD Signature Date/Time: 07/14/2022/3:29:15 PM    Final     Procedures Procedures    Medications Ordered in ED Medications  enoxaparin (LOVENOX) injection 40 mg (has no administration in time range)  acetaminophen (TYLENOL) tablet 650 mg (has no administration in time range)    Or  acetaminophen (TYLENOL) suppository 650 mg (has no administration in time range)  senna-docusate (Senokot-S) tablet 1 tablet (has no administration in time range)  ondansetron (ZOFRAN) tablet 4 mg (has no administration in time range)    Or  ondansetron (ZOFRAN) injection 4 mg (has no administration in time range)  iohexol (OMNIPAQUE) 350 MG/ML injection 75 mL (75 mLs Intravenous Contrast Given 07/14/22 1716)  furosemide (LASIX) injection 20 mg (20 mg Intravenous Given 07/14/22 1900)    ED Course/ Medical Decision Making/ A&P This patient with a Hx of no substantial medical problems, but chest pain, dyspnea for the past week presents to the ED for concern of chest pain, this involves an extensive number of treatment options, and is a complaint that carries with it a high risk of complications and morbidity.    The differential diagnosis includes dissection, ACS, inflammation, musculoskeletal  Social Determinants of Health:  No limited  Additional history obtained:  Additional history and/or information obtained from chart review with notes from radiology earlier today after echocardiogram and the echocardiogram results and cells, notable for concern for dilatation of the proximal aorta.   After the initial evaluation, orders, including: CT angiography were initiated.   Patient placed on Cardiac and Pulse-Oximetry Monitors. The patient was maintained on a cardiac  monitor.  The cardiac monitored showed an rhythm of 95 sinus normal The patient was also maintained on pulse oximetry. The readings were typically 100% room air normal   On repeat evaluation of the patient stayed the same  Lab Tests:  I personally interpreted labs.  The pertinent results include: Reassuring troponin  Imaging Studies ordered:  I independently visualized and interpreted imaging which showed dilatation of the aortic root I agree with the radiologist interpretation  Consultations Obtained:  I requested consultation with the thoracic surgery, Dr. Kipp Brood,  and discussed lab and imaging findings as well as pertinent plan - they recommend: Admission, likely left heart cath tomorrow  Dispostion / Final MDM:  After consideration of the diagnostic results and the patient's response to treatment, adult male with chest pain, dyspnea for the past week presents with these concerns patient is awake, alert, and arrives with concern for outpatient ultrasound suggesting aortic enlargement versus dissection.  Here CT angiography suggests dilatation, without dissection, no evidence for concurrent ACS, patient also found to have decreased ejection fraction on echocardiogram, all requiring admission for further monitoring, management.  Final Clinical Impression(s) / ED Diagnoses Final diagnoses:  Aneurysm of ascending aorta without rupture [facility])     Carmin Muskrat, MD 07/14/22 2218

## 2022-07-14 NOTE — ED Notes (Signed)
Verbal report Sheridan RN at this time

## 2022-07-14 NOTE — ED Triage Notes (Signed)
Sent from [facility] after echo resulting with aortic root dilation of 5.2 and descending aorta 4.2  Denies chest pain at this time.  Reports he went to internal medicine 1 week ago for chest pain and palpitations and they ordered the echo.  Patient reports mild sob.
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
## 2022-07-14 NOTE — ED Notes (Signed)
Received verbal report from Joshua N RN at this time 

## 2022-07-14 NOTE — Telephone Encounter (Signed)
Received a message from Dr. Stanford Breed.  Patient presented today for his 2D echo and was noted to have a dilated aortic root of 5.2 cm.  We cannot rule out aortic dissection.  Triage nurse Laurenze called patient and informed him that his echo was abnormal and the need to follow-up with the emergency room as soon as possible.  I called the charge nurse to inform them that this patient may have a possible aortic dissection and that he is on his way to the emergency room.  Patient will need a stat CTA to rule out dissection.

## 2022-07-14 NOTE — Progress Notes (Signed)
     [address]       [place], [address]             [phone]       Images, chart reviewed, and case discussed with ED staff. Tricuspid valve with 5.1cm root aneurysm.  Does not meet criteria surgical repair, but chest pain is concerning.  Would recommend admission for medical optimization, cardiology consult for left heart cath, and OMFS consult dental clearance.  CTS will follow along.  Full consult to follow.  Fusako Tanabe Bary Leriche

## 2022-07-14 NOTE — ED Notes (Signed)
Patient transported to CT 

## 2022-07-15 ENCOUNTER — Inpatient Hospital Stay (HOSPITAL_COMMUNITY)
Admission: EM | Disposition: A | Discharge status: Home / Self Care | Payer: Self-pay | Source: Home / Self Care | Attending: Internal Medicine

## 2022-07-15 ENCOUNTER — Encounter (HOSPITAL_COMMUNITY): Payer: Self-pay | Admitting: Cardiovascular Disease

## 2022-07-15 DIAGNOSIS — R079 Chest pain, unspecified: Secondary | ICD-10-CM

## 2022-07-15 DIAGNOSIS — I712 Thoracic aortic aneurysm, without rupture, unspecified: Secondary | ICD-10-CM

## 2022-07-15 DIAGNOSIS — R5383 Other fatigue: Secondary | ICD-10-CM

## 2022-07-15 DIAGNOSIS — I7121 Aneurysm of the ascending aorta, without rupture: Secondary | ICD-10-CM

## 2022-07-15 DIAGNOSIS — I429 Cardiomyopathy, unspecified: Secondary | ICD-10-CM

## 2022-07-15 HISTORY — PX: LEFT HEART CATH AND CORONARY ANGIOGRAPHY: CATH118249

## 2022-07-15 LAB — BASIC METABOLIC PANEL
Anion gap: 9 (ref 5–15)
BUN: 15 mg/dL (ref 6–20)
CO2: 23 mmol/L (ref 22–32)
Calcium: 9 mg/dL (ref 8.9–10.3)
Chloride: 105 mmol/L (ref 98–111)
Creatinine, Ser: 1.4 mg/dL — ABNORMAL HIGH (ref 0.61–1.24)
GFR, Estimated: >60 mL/min (ref >60)
Glucose, Bld: 98 mg/dL (ref 70–99)
Potassium: 4.3 mmol/L (ref 3.5–5.1)
Sodium: 137 mmol/L (ref 135–145)

## 2022-07-15 LAB — HIV ANTIBODY (ROUTINE TESTING W REFLEX): HIV Screen 4th Generation wRfx: NONREACTIVE

## 2022-07-15 LAB — URINALYSIS, ROUTINE W REFLEX MICROSCOPIC
Bilirubin Urine: NEGATIVE
Glucose, UA: NEGATIVE mg/dL
Ketones, ur: NEGATIVE mg/dL
Leukocytes,Ua: NEGATIVE
Nitrite: NEGATIVE
Protein, ur: NEGATIVE mg/dL
Specific Gravity, Urine: 1.01 (ref 1.005–1.030)
pH: 5.5 (ref 5.0–8.0)

## 2022-07-15 LAB — URINALYSIS, MICROSCOPIC (REFLEX): Squamous Epithelial / HPF: NONE SEEN (ref 0–5)

## 2022-07-15 LAB — MRSA NEXT GEN BY PCR, NASAL: MRSA by PCR Next Gen: NOT DETECTED

## 2022-07-15 SURGERY — LEFT HEART CATH AND CORONARY ANGIOGRAPHY
Anesthesia: LOCAL

## 2022-07-15 MED ORDER — ASPIRIN 81 MG PO CHEW
81.0000 mg | CHEWABLE_TABLET | ORAL | Status: AC
  Administered 2022-07-15: 81 mg via ORAL
  Filled 2022-07-15: qty 1

## 2022-07-15 MED ORDER — SODIUM CHLORIDE 0.9% FLUSH: 3.0000 mL | INTRAVENOUS | Status: DC | PRN

## 2022-07-15 MED ORDER — IOHEXOL 350 MG/ML SOLN
INTRAVENOUS | Status: DC | PRN
  Administered 2022-07-15: 66 mL

## 2022-07-15 MED ORDER — HEPARIN SODIUM (PORCINE) 1000 UNIT/ML IJ SOLN
INTRAMUSCULAR | Status: AC
  Filled 2022-07-15: qty 10

## 2022-07-15 MED ORDER — METOPROLOL SUCCINATE ER 25 MG PO TB24
25.0000 mg | ORAL_TABLET | Freq: Every day | ORAL | Status: DC
  Administered 2022-07-15: 25 mg via ORAL
  Filled 2022-07-15: qty 1

## 2022-07-15 MED ORDER — SODIUM CHLORIDE 0.9 % IV SOLN: INTRAVENOUS | Status: AC

## 2022-07-15 MED ORDER — HEPARIN SODIUM (PORCINE) 1000 UNIT/ML IJ SOLN
INTRAMUSCULAR | Status: DC | PRN
  Administered 2022-07-15: 5000 [IU] via INTRAVENOUS

## 2022-07-15 MED ORDER — HEPARIN (PORCINE) IN NACL 1000-0.9 UT/500ML-% IV SOLN
INTRAVENOUS | Status: AC
  Filled 2022-07-15: qty 1000

## 2022-07-15 MED ORDER — VERAPAMIL HCL 2.5 MG/ML IV SOLN
INTRAVENOUS | Status: AC
  Filled 2022-07-15: qty 2

## 2022-07-15 MED ORDER — SODIUM CHLORIDE 0.9 % WEIGHT BASED INFUSION: 1.0000 mL/kg/h | INTRAVENOUS | Status: DC

## 2022-07-15 MED ORDER — SODIUM CHLORIDE 0.9% FLUSH
3.0000 mL | Freq: Two times a day (BID) | INTRAVENOUS | Status: DC
  Administered 2022-07-15: 3 mL via INTRAVENOUS

## 2022-07-15 MED ORDER — FENTANYL CITRATE (PF) 100 MCG/2ML IJ SOLN
INTRAMUSCULAR | Status: DC | PRN
  Administered 2022-07-15: 50 ug via INTRAVENOUS

## 2022-07-15 MED ORDER — MIDAZOLAM HCL 2 MG/2ML IJ SOLN
INTRAMUSCULAR | Status: AC
  Filled 2022-07-15: qty 2

## 2022-07-15 MED ORDER — SODIUM CHLORIDE 0.9% FLUSH: 3.0000 mL | Freq: Two times a day (BID) | INTRAVENOUS | Status: DC

## 2022-07-15 MED ORDER — LIDOCAINE HCL (PF) 1 % IJ SOLN
INTRAMUSCULAR | Status: DC | PRN
  Administered 2022-07-15: 2 mL via INTRADERMAL

## 2022-07-15 MED ORDER — SODIUM CHLORIDE 0.9 % IV SOLN: 250.0000 mL | INTRAVENOUS | Status: DC | PRN

## 2022-07-15 MED ORDER — METOPROLOL SUCCINATE ER 25 MG PO TB24: 25.0000 mg | ORAL_TABLET | Freq: Every day | ORAL | 3 refills | Status: DC

## 2022-07-15 MED ORDER — FENTANYL CITRATE (PF) 100 MCG/2ML IJ SOLN
INTRAMUSCULAR | Status: AC
  Filled 2022-07-15: qty 2

## 2022-07-15 MED ORDER — SODIUM CHLORIDE 0.9 % WEIGHT BASED INFUSION
3.0000 mL/kg/h | INTRAVENOUS | Status: DC
  Administered 2022-07-15: 3 mL/kg/h via INTRAVENOUS

## 2022-07-15 MED ORDER — VERAPAMIL HCL 2.5 MG/ML IV SOLN
INTRAVENOUS | Status: DC | PRN
  Administered 2022-07-15: 10 mL via INTRA_ARTERIAL

## 2022-07-15 MED ORDER — HEPARIN (PORCINE) IN NACL 1000-0.9 UT/500ML-% IV SOLN
INTRAVENOUS | Status: DC | PRN
  Administered 2022-07-15 (×2): 500 mL

## 2022-07-15 MED ORDER — LIDOCAINE HCL (PF) 1 % IJ SOLN
INTRAMUSCULAR | Status: AC
  Filled 2022-07-15: qty 30

## 2022-07-15 MED ORDER — MIDAZOLAM HCL 2 MG/2ML IJ SOLN
INTRAMUSCULAR | Status: DC | PRN
  Administered 2022-07-15: 1 mg via INTRAVENOUS

## 2022-07-15 SURGICAL SUPPLY — 13 items
BAND CMPR LRG ZPHR (HEMOSTASIS) ×1
BAND ZEPHYR COMPRESS 30 LONG (HEMOSTASIS) IMPLANT
CATH INFINITI 5FR JK (CATHETERS) IMPLANT
CATH INFINITI 5FR JL4 (CATHETERS) IMPLANT
CATH INFINITI JR4 5F (CATHETERS) IMPLANT
GLIDESHEATH SLEND SS 6F .021 (SHEATH) IMPLANT
GUIDEWIRE INQWIRE 1.5J.035X260 (WIRE) IMPLANT
INQWIRE 1.5J .035X260CM (WIRE) ×1
KIT SYRINGE INJ CVI SPIKEX1 (MISCELLANEOUS) IMPLANT
PACK CARDIAC CATHETERIZATION (CUSTOM PROCEDURE TRAY) ×1 IMPLANT
PROTECTION STATION PRESSURIZED (MISCELLANEOUS) ×1
SET ATX SIMPLICITY (MISCELLANEOUS) IMPLANT
STATION PROTECTION PRESSURIZED (MISCELLANEOUS) IMPLANT
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
## 2022-07-15 NOTE — Progress Notes (Signed)
Tr band removed, tolerated well, no sign of bleeding noted.

## 2022-07-15 NOTE — Social Work (Signed)
CSW sent a  email to financial counseling to inquire about pt eligibility for medicaid.

## 2022-07-15 NOTE — Progress Notes (Signed)
Mobility Specialist Progress Note    07/15/22 1637  Mobility  Activity Ambulated independently in hallway  Level of Assistance Independent  Assistive Device None  Distance Ambulated (ft) 420 ft  Activity Response Tolerated well  $Mobility charge 1 Mobility   Pre-Mobility: 80 HR, 97% SpO2 During Mobility: 93 HR Post-Mobility: 92 HR  Pt received in bed and agreeable. No complaints on walk. Returned to [facility] for void.   Hildred Alamin Mobility Specialist

## 2022-07-15 NOTE — Progress Notes (Signed)
Subjective:  Denies SSCP, palpitations or Dyspnea Long discussion about diagnosis of aneurysm and need To clear coronary arteries with chest pain   Objective:  Vitals:   07/15/22 0100 07/15/22 0300 07/15/22 0400 07/15/22 0800  BP: (!) 150/104 (!) 126/95 (!) 137/102   Pulse: 89 81 80   Resp:  20 11   Temp: 98.4 F (36.9 C) 98.5 F (36.9 C)  98.3 F (36.8 C)  TempSrc: Oral Oral  Oral  SpO2: 93% 93% 93%   Weight:      Height:        Intake/Output from previous day:  Intake/Output Summary (Last 24 hours) at 07/15/2022 [id number] Last data filed at 07/15/2022 0300 Gross per 24 hour  Intake --  Output 1650 ml  Net -1650 ml    Physical Exam: Affect appropriate Healthy:  appears stated age HEENT: normal Neck supple with no adenopathy JVP normal no bruits no thyromegaly Lungs clear with no wheezing and good diaphragmatic motion Heart:  S1/S2 no murmur, no rub, gallop or click PMI normal Abdomen: benighn, BS positve, no tenderness, no AAA no bruit.  No HSM or HJR Distal pulses intact with no bruits No edema Neuro non-focal Skin warm and dry No muscular weakness   Lab Results: Basic Metabolic Panel: Recent Labs    07/14/22 1638 07/14/22 2242  NA 136  --   K 3.5  --   CL 105  --   CO2 25  --   GLUCOSE 125*  --   BUN 17  --   CREATININE 1.44*  --   CALCIUM 9.0  --   MG  --  1.8   Liver Function Tests: Recent Labs    07/14/22 1638  AST 20  ALT 22  ALKPHOS 62  BILITOT 0.9  PROT 6.8  ALBUMIN 3.6   No results for input(s): "LIPASE", "AMYLASE" in the last 72 hours. CBC: Recent Labs    07/14/22 1638  WBC 5.6  NEUTROABS 3.0  HGB 15.1  HCT 43.4  MCV 91.2  PLT 315    Hemoglobin A1C: Recent Labs    07/14/22 2242  HGBA1C 5.2   Fasting Lipid Panel: Recent Labs    07/14/22 2242  CHOL 203*  HDL 57  LDLCALC 132*  TRIG 70  CHOLHDL 3.6   Thyroid Function Tests: Recent Labs    07/14/22 2242  TSH 2.196   Anemia Panel: No results for  input(s): "VITAMINB12", "FOLATE", "FERRITIN", "TIBC", "IRON", "RETICCTPCT" in the last 72 hours.  Imaging: CT Angio Chest/Abd/Pel for Dissection W and/or Wo Contrast  Result Date: 07/14/2022 CLINICAL DATA:  Acute aortic syndrome (AAS) suspected poss dissection on ECHO today EXAM: CT ANGIOGRAPHY CHEST, ABDOMEN AND PELVIS TECHNIQUE: Non-contrast CT of the chest was initially obtained. Multidetector CT imaging through the chest, abdomen and pelvis was performed using the standard protocol during bolus administration of intravenous contrast. Multiplanar reconstructed images and MIPs were obtained and reviewed to evaluate the vascular anatomy. RADIATION DOSE REDUCTION: This exam was performed according to the departmental dose-optimization program which includes automated exposure control, adjustment of the mA and/or kV according to patient size and/or use of iterative reconstruction technique. CONTRAST:  20mL OMNIPAQUE IOHEXOL 350 MG/ML SOLN COMPARISON:  None Available. FINDINGS: CTA CHEST FINDINGS Cardiovascular: Aneurysmal dilatation of the aortic root measuring 5.1 cm at the sinuses of Valsalva. Ascending thoracic aorta measures 3.5 cm. Descending thoracic aorta measures 3.0 cm. No evidence of dissection. Heart is normal size. Mediastinum/Nodes: No mediastinal, hilar, or axillary  adenopathy. Trachea and esophagus are unremarkable. Thyroid unremarkable. Lungs/Pleura: No confluent opacities or effusions. Musculoskeletal: Chest wall soft tissues are unremarkable. No acute bony abnormality. Review of the MIP images confirms the above findings. CTA ABDOMEN AND PELVIS FINDINGS VASCULAR Aorta: Normal caliber aorta without aneurysm, dissection, vasculitis or significant stenosis. Celiac: Patent without evidence of aneurysm, dissection, vasculitis or significant stenosis. SMA: Patent without evidence of aneurysm, dissection, vasculitis or significant stenosis. Renals: Both renal arteries are patent without evidence of  aneurysm, dissection, vasculitis, fibromuscular dysplasia or significant stenosis. IMA: Patent without evidence of aneurysm, dissection, vasculitis or significant stenosis. Inflow: Patent without evidence of aneurysm, dissection, vasculitis or significant stenosis. Veins: No obvious venous abnormality within the limitations of this arterial phase study. Review of the MIP images confirms the above findings. NON-VASCULAR Hepatobiliary: No focal liver abnormality is seen. Status post cholecystectomy. No biliary dilatation. Pancreas: No focal abnormality or ductal dilatation. Spleen: Prior splenectomy. Accessory splenules noted in the left upper quadrant. Adrenals/Urinary Tract: 3 mm nonobstructing stone in the midpole of the left kidney. No right renal stones or ureteral stones bilaterally. No hydronephrosis. Adrenal glands and urinary bladder unremarkable. Stomach/Bowel: Colonic diverticulosis most pronounced in the sigmoid colon and right colon. No evidence of active diverticulitis. Stomach and small bowel grossly unremarkable. No obstruction. Lymphatic: No adenopathy Reproductive: No visible focal abnormality. Other: No free fluid or free air. Musculoskeletal: Old healed posttraumatic changes in the left in the pelvis. No acute bony abnormality. Review of the MIP images confirms the above findings. 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 Recommend semi-annual imaging followup by CTA or MRA and referral to cardiothoracic surgery if not already obtained. This recommendation follows 2010 ACCF/AHA/AATS/ACR/ASA/SCA/SCAI/SIR/STS/SVM Guidelines for the Diagnosis and Management of Patients With Thoracic Aortic Disease. Circulation. 2010; 121: P824-M353. Aortic aneurysm NOS (ICD10-I71.9) No evidence of dissection. No acute cardiopulmonary disease. Colonic diverticulosis.  No active diverticulitis. Electronically Signed   By: Rolm Baptise M.D.   On: 07/14/2022 17:26   ECHOCARDIOGRAM COMPLETE  Result Date: 07/14/2022    ECHOCARDIOGRAM REPORT   Patient Name:   Timothy Browning Date of Exam: 07/14/2022 Medical Rec #:  [id number]       Height:       72.0 in Accession #:    [id number]      Weight:       234.8 lb Date of Birth:  02-May-1971       BSA:          2.281 m Patient Age:    29 years        BP:           147/95 mmHg Patient Gender: M               HR:           73 bpm. Exam Location:  Outpatient Procedure: 2D Echo, Cardiac Doppler and Color Doppler Indications:    Other abnormalities of the heart R00.8  History:        Patient has no prior history of Echocardiogram examinations.                 Risk Factors:GERD.  Sonographer:    Bernadene Person RDCS Referring Phys: [id number] [facility] NARENDRA IMPRESSIONS  1. Severely dilated aortic root (5.2 cm); cannot R/O dissection in aortic arch; suggest CTA or MRA to further assess. [employer] via secure chat.  2. Left ventricular ejection  fraction, by estimation, is 45 to 50%. The left ventricle has mildly decreased function. The left ventricle demonstrates global hypokinesis. There is mild concentric left ventricular hypertrophy. Left ventricular diastolic parameters are consistent with Grade I diastolic dysfunction (impaired relaxation).  3. Right ventricular systolic function is normal. The right ventricular size is normal.  4. The mitral valve is normal in structure. No evidence of mitral valve regurgitation. No evidence of mitral stenosis.  5. The aortic valve is normal in structure. Aortic valve regurgitation is trivial. No aortic stenosis is present.  6. Aortic dilatation noted. There is severe dilatation of the aortic root, measuring 52 mm. There is mild dilatation of the ascending aorta, measuring 43 mm.  7. The inferior vena cava is normal in size with greater than 50% respiratory variability, suggesting right atrial pressure of 3 mmHg. Comparison(s): No prior Echocardiogram.  FINDINGS  Left Ventricle: Left ventricular ejection fraction, by estimation, is 45 to 50%. The left ventricle has mildly decreased function. The left ventricle demonstrates global hypokinesis. The left ventricular internal cavity size was normal in size. There is  mild concentric left ventricular hypertrophy. Left ventricular diastolic parameters are consistent with Grade I diastolic dysfunction (impaired relaxation). Right Ventricle: The right ventricular size is normal. Right ventricular systolic function is normal. Left Atrium: Left atrial size was normal in size. Right Atrium: Right atrial size was normal in size. Pericardium: There is no evidence of pericardial effusion. Mitral Valve: The mitral valve is normal in structure. No evidence of mitral valve regurgitation. No evidence of mitral valve stenosis. Tricuspid Valve: The tricuspid valve is normal in structure. Tricuspid valve regurgitation is trivial. No evidence of tricuspid stenosis. Aortic Valve: The aortic valve is normal in structure. Aortic valve regurgitation is trivial. Aortic regurgitation PHT measures 415 msec. No aortic stenosis is present. Pulmonic Valve: The pulmonic valve was normal in structure. Pulmonic valve regurgitation is mild. No evidence of pulmonic stenosis. Aorta: Aortic dilatation noted. There is severe dilatation of the aortic root, measuring 52 mm. There is mild dilatation of the ascending aorta, measuring 43 mm. Venous: The inferior vena cava is normal in size with greater than 50% respiratory variability, suggesting right atrial pressure of 3 mmHg. IAS/Shunts: No atrial level shunt detected by color flow Doppler. Additional Comments: Severely dilated aortic root (5.2 cm); cannot R/O dissection in aortic arch; suggest CTA or MRA to further assess. [employer] via secure chat.  LEFT VENTRICLE PLAX 2D LVIDd:         5.20 cm      Diastology LVIDs:         3.70 cm      LV e' medial:    5.93 cm/s LV PW:         1.20 cm       LV E/e' medial:  8.7 LV IVS:        1.10 cm      LV e' lateral:   6.75 cm/s LVOT diam:     2.50 cm      LV E/e' lateral: 7.6 LV SV:         66 LV SV Index:   29 LVOT Area:     4.91 cm  LV Volumes (MOD) LV vol d, MOD A2C: 141.0 ml LV vol d, MOD A4C: 114.0 ml LV vol s, MOD A2C: 75.2 ml LV vol s, MOD A4C: 55.1 ml LV SV MOD A2C:     65.8 ml LV SV MOD A4C:     114.0  ml LV SV MOD BP:      64.2 ml RIGHT VENTRICLE RV S prime:     9.00 cm/s TAPSE (M-mode): 1.3 cm LEFT ATRIUM             Index        RIGHT ATRIUM           Index LA diam:        2.70 cm 1.18 cm/m   RA Area:     16.60 cm LA Vol (A2C):   50.0 ml 21.92 ml/m  RA Volume:   44.90 ml  19.69 ml/m LA Vol (A4C):   54.0 ml 23.68 ml/m LA Biplane Vol: 52.0 ml 22.80 ml/m  AORTIC VALVE                   PULMONIC VALVE LVOT Vmax:         69.20 cm/s  PR End Diast Vel: 3.00 msec LVOT Vmean:        44.300 cm/s LVOT VTI:          0.135 m AI PHT:            415 msec AR Vena Contracta: 0.20 cm  AORTA Ao Root diam: 5.20 cm Ao Asc diam:  4.30 cm MITRAL VALVE MV Area (PHT): 3.72 cm    SHUNTS MV Decel Time: 204 msec    Systemic VTI:  0.14 m MV E velocity: 51.60 cm/s  Systemic Diam: 2.50 cm MV A velocity: 50.10 cm/s MV E/A ratio:  1.03 Kirk Ruths MD Electronically signed by Kirk Ruths MD Signature Date/Time: 07/14/2022/3:29:15 PM    Final     Cardiac Studies:  ECG:  SR Nonspecific ST changes    Telemetry:  NSR   Echo: Ao 5.2 cm tri leaflet AV EF 45-50% trivial AR  Medications:    enoxaparin (LOVENOX) injection  40 mg Subcutaneous Q24H   metoprolol succinate  25 mg Oral Daily   sodium chloride flush  3 mL Intravenous Q12H      Assessment/Plan:   Chest Pain:  atypical no acute ECG changes Admitted after TTE noted Ascending thoracic aneurysm. No dissection on gated CTA. Unfortunately cannot clear cors on CT due to respiratory artifact , lack of beta blockade and nitro. He has no calcium on his arteries Doubt obstructive CAD. Discussed risks of cath  including stroke, bleeding, need for emergency surgery and radial artery injury Willing to proceed orders written on board for TK Aneurysm:  maternal grand mother had AAA. 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

## 2022-07-15 NOTE — Progress Notes (Signed)
MSW made aware that patient wants to talk to her.

## 2022-07-15 NOTE — Discharge Summary (Addendum)
Name: Timothy Browning MRN: [id number] DOB: 1970-12-09 51 y.o. PCP: Nani Gasser, MD  Date of Admission: 07/14/2022  4:06 PM Date of Discharge: 07/15/2022  5:44 PM Attending Physician: Dr. Dorian Pod, MD  Discharge Diagnosis: 1. Principal Problem:   Aortic aneurysm without rupture [facility]) Active Problems:   Chest pain of uncertain etiology   Palpitations   Dizziness   Fatigue   Discharge Medications: Allergies as of 07/15/2022   No Known Allergies      Medication List     STOP taking these medications    acetaminophen 500 MG tablet Commonly known as: TYLENOL   Potassium 99 MG Tabs       TAKE these medications    metoprolol succinate 25 MG 24 hr tablet Commonly known as: TOPROL-XL Take 1 tablet (25 mg total) by mouth daily. Start taking on: July 16, 2022   sildenafil 25 MG tablet Commonly known as: VIAGRA Take 100 mg by mouth daily as needed for erectile dysfunction.        Disposition and follow-up:   [name] was discharged from [facility] in Good condition.  At the hospital follow up visit please address:  1.  Caffeine intake as patient has had palpitations and aortic aneurysm, evaluation for sleep apnea as this may be a cause of his fatigue throughout the day, evaluation for shortness of breath, chest pain, preworkout use, dizziness.  2.  Would also recommend, if patient is still symptomatic at time of appointment, Holter monitor to evaluate palpitations, dizziness if patient is symptomatic. CT of chest in 6 months, should have appointment with Dr. Kipp Brood in outpatient setting  3.  Labs / imaging needed at time of follow-up: BMP to assess kidney function  4.  Pending labs/ test needing follow-up: N/A  Follow-up Appointments:  [facility]: 10/23 at [place] by problem list:  #Ascending Aortic Aneurysm Without Rupture [name] was found on outpatient echocardiogram to have aortic root  dilatation and he was sent directly to the ED for further evaluation;  CTA chest identified aneurysmal dilatation without dissection of the aortic root measuring 5.1 cm.  CT surgery was consulted, determining that there was currently no indication for surgery.  He will need surveillance imaging every 6 months.   #Chest Pain associated with Dizziness, and palpitations Patient was admitted with worsening and more frequent intermittent midsternal, chest soreness associate with lightheaded and dyspnea with exertion over the past year.  He states his main concern is the dizziness, and chest pain has not bothered him too much.  Lipid panel, A1c, TSH all obtained during his hospital visit.  Cardiology was consulted and recommended a left heart cath, to evaluate for any coronary artery disease which could be causing the dizziness and chest soreness (or which might explain his mildly reduced EF).  Cath was performed, and patient did not have any coronary artery disease.  Of note, patient has been taking preworkout supplement called "LIT", and is also a heavy caffeine drinker averaging around 6 to 7 cups of tea on top of the preworkout that he takes daily.  He states that he has not taken his preworkout in 3 weeks, and his symptoms have improved.  Advised patient to discontinue use of the preworkout, and gradually decrease the amount of caffeine that he drinks.  Unsure cause of chest pain with dizziness, would recommend evaluation for obstructive sleep apnea in the outpatient setting.  #Heart failure with mildly reduced ejection fraction  Patient's echo revealed a EF of 45 to 50%, with mildly decreased function and global hypokinesis with concentric left ventricular hypertrophy.  Patient does not seem volume overloaded on exam, and has not been started on any GDMT management due to kidney function.  Unsure of etiology of heart failure, could be due to patient's history of lifting weights leading to concentric left  ventricular hypertrophy.  Recommend slow initiation of GDMT with consistent kidney function evaluation.  #CKD 3A Creatinine has been uptrending since 2018.  Creatinine is currently elevated at 1.4 and GFR on admission was around 59, however upon discharge it is over 60.  We will continue to monitor in the outpatient setting.    Discharge Exam:   BP 134/87   Pulse 75   Temp 98.3 F (36.8 C) (Oral)   Resp 18   Ht 6' (1.829 m)   Wt 106.1 kg   SpO2 95%   BMI 31.74 kg/m  Discharge exam:   Constitutional: Muscular, anxious man sitting in bed, in no acute distress  Cardiovascular: RRR, no murmurs, rubs, or gallops, no JVD. 2+ Bilateral and symmetrical radial and PD pulses Pulmonary/Chest: Normal work of breathing on room air, lungs clear to auscultation bilaterally Extremities: Trace LE edema appreciated (sock elastic indentation), DP, PT pulses intact and symmetric +2.   Pertinent Labs, Studies, and Procedures:     Latest Ref Rng & Units 07/14/2022    4:38 PM 06/26/2021    5:48 PM 02/06/2020    4:27 PM  CBC  WBC 4.0 - 10.5 K/uL 5.6  5.6  5.5   Hemoglobin 13.0 - 17.0 g/dL 15.1  15.3  15.8   Hematocrit 39.0 - 52.0 % 43.4  45.1  46.3   Platelets 150 - 400 K/uL 315  306  346        Latest Ref Rng & Units 07/15/2022   10:46 AM 07/14/2022    4:38 PM 07/02/2022   11:01 AM  BMP  Glucose 70 - 99 mg/dL 98  125  90   BUN 6 - 20 mg/dL 15  17  22    Creatinine 0.61 - 1.24 mg/dL 1.40  1.44  1.30   BUN/Creat Ratio 9 - 20   17   Sodium 135 - 145 mmol/L 137  136  138   Potassium 3.5 - 5.1 mmol/L 4.3  3.5  4.5   Chloride 98 - 111 mmol/L 105  105  102   CO2 22 - 32 mmol/L 23  25  21    Calcium 8.9 - 10.3 mg/dL 9.0  9.0  9.3      CARDIAC CATHETERIZATION  Result Date: 07/15/2022 1.  Normal coronary arteries. 2.  Left ventricular angiography was not performed.  EF was mildly reduced by echo. 3.  Mildly elevated left ventricular end-diastolic pressure at 17 mmHg. 4.  Difficult catheterization via  the right radial artery due to significant tortuosity of the innominate artery as well as ascending aortic aneurysm. Recommendations: Chest pain is not due to obstructive coronary artery disease.  Continue monitoring of ascending aortic aneurysm.   CT Angio Chest/Abd/Pel for Dissection W and/or Wo Contrast  Result Date: 07/14/2022 CLINICAL DATA:  Acute aortic syndrome (AAS) suspected poss dissection on ECHO today EXAM: CT ANGIOGRAPHY CHEST, ABDOMEN AND PELVIS TECHNIQUE: Non-contrast CT of the chest was initially obtained. Multidetector CT imaging through the chest, abdomen and pelvis was performed using the standard protocol during bolus administration of intravenous contrast. Multiplanar reconstructed images and MIPs were obtained and reviewed  to evaluate the vascular anatomy. RADIATION DOSE REDUCTION: This exam was performed according to the departmental dose-optimization program which includes automated exposure control, adjustment of the mA and/or kV according to patient size and/or use of iterative reconstruction technique. CONTRAST:  62mL OMNIPAQUE IOHEXOL 350 MG/ML SOLN COMPARISON:  None Available. FINDINGS: CTA CHEST FINDINGS Cardiovascular: Aneurysmal dilatation of the aortic root measuring 5.1 cm at the sinuses of Valsalva. Ascending thoracic aorta measures 3.5 cm. Descending thoracic aorta measures 3.0 cm. No evidence of dissection. Heart is normal size. Mediastinum/Nodes: No mediastinal, hilar, or axillary adenopathy. Trachea and esophagus are unremarkable. Thyroid unremarkable. Lungs/Pleura: No confluent opacities or effusions. Musculoskeletal: Chest wall soft tissues are unremarkable. No acute bony abnormality. Review of the MIP images confirms the above findings. CTA ABDOMEN AND PELVIS FINDINGS VASCULAR Aorta: Normal caliber aorta without aneurysm, dissection, vasculitis or significant stenosis. Celiac: Patent without evidence of aneurysm, dissection, vasculitis or significant stenosis. SMA:  Patent without evidence of aneurysm, dissection, vasculitis or significant stenosis. Renals: Both renal arteries are patent without evidence of aneurysm, dissection, vasculitis, fibromuscular dysplasia or significant stenosis. IMA: Patent without evidence of aneurysm, dissection, vasculitis or significant stenosis. Inflow: Patent without evidence of aneurysm, dissection, vasculitis or significant stenosis. Veins: No obvious venous abnormality within the limitations of this arterial phase study. Review of the MIP images confirms the above findings. NON-VASCULAR Hepatobiliary: No focal liver abnormality is seen. Status post cholecystectomy. No biliary dilatation. Pancreas: No focal abnormality or ductal dilatation. Spleen: Prior splenectomy. Accessory splenules noted in the left upper quadrant. Adrenals/Urinary Tract: 3 mm nonobstructing stone in the midpole of the left kidney. No right renal stones or ureteral stones bilaterally. No hydronephrosis. Adrenal glands and urinary bladder unremarkable. Stomach/Bowel: Colonic diverticulosis most pronounced in the sigmoid colon and right colon. No evidence of active diverticulitis. Stomach and small bowel grossly unremarkable. No obstruction. Lymphatic: No adenopathy Reproductive: No visible focal abnormality. Other: No free fluid or free air. Musculoskeletal: Old healed posttraumatic changes in the left in the pelvis. No acute bony abnormality. Review of the MIP images confirms the above findings. 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 Recommend semi-annual imaging followup by CTA or MRA and referral to cardiothoracic surgery if not already obtained. This recommendation follows 2010 ACCF/AHA/AATS/ACR/ASA/SCA/SCAI/SIR/STS/SVM Guidelines for the Diagnosis and Management of Patients With Thoracic Aortic Disease. Circulation. 2010; 121: N867-E720. Aortic aneurysm NOS (ICD10-I71.9)  No evidence of dissection. No acute cardiopulmonary disease. Colonic diverticulosis.  No active diverticulitis. Electronically Signed   By: Rolm Baptise M.D.   On: 07/14/2022 17:26   ECHOCARDIOGRAM COMPLETE  Result Date: 07/14/2022    ECHOCARDIOGRAM REPORT   Patient Name:   JAMONE GARRIDO Date of Exam: 07/14/2022 Medical Rec #:  [id number]       Height:       72.0 in Accession #:    [id number]      Weight:       234.8 lb Date of Birth:  [birth year]       BSA:          2.281 m Patient Age:    24 years        BP:           147/95 mmHg Patient Gender: M               HR:           73 bpm. Exam Location:  Outpatient Procedure: 2D Echo,  Cardiac Doppler and Color Doppler Indications:    Other abnormalities of the heart R00.8  History:        Patient has no prior history of Echocardiogram examinations.                 Risk Factors:GERD.  Sonographer:    Eulah PontSarah Pirrotta RDCS Referring Phys: [id number] [facility] Center[facility]ISCHAL NARENDRA IMPRESSIONS  1. Severely dilated aortic root (5.2 cm); cannot R/O dissection in aortic arch; suggest CTA or MRA to further assess. Nischal [employer] messaged via secure chat.  2. Left ventricular ejection fraction, by estimation, is 45 to 50%. The left ventricle has mildly decreased function. The left ventricle demonstrates global hypokinesis. There is mild concentric left ventricular hypertrophy. Left ventricular diastolic parameters are consistent with Grade I diastolic dysfunction (impaired relaxation).  3. Right ventricular systolic function is normal. The right ventricular size is normal.  4. The mitral valve is normal in structure. No evidence of mitral valve regurgitation. No evidence of mitral stenosis.  5. The aortic valve is normal in structure. Aortic valve regurgitation is trivial. No aortic stenosis is present.  6. Aortic dilatation noted. There is severe dilatation of the aortic root, measuring 52 mm. There is mild dilatation of the ascending aorta, measuring 43 mm.  7. The inferior vena cava is  normal in size with greater than 50% respiratory variability, suggesting right atrial pressure of 3 mmHg. Comparison(s): No prior Echocardiogram. FINDINGS  Left Ventricle: Left ventricular ejection fraction, by estimation, is 45 to 50%. The left ventricle has mildly decreased function. The left ventricle demonstrates global hypokinesis. The left ventricular internal cavity size was normal in size. There is  mild concentric left ventricular hypertrophy. Left ventricular diastolic parameters are consistent with Grade I diastolic dysfunction (impaired relaxation). Right Ventricle: The right ventricular size is normal. Right ventricular systolic function is normal. Left Atrium: Left atrial size was normal in size. Right Atrium: Right atrial size was normal in size. Pericardium: There is no evidence of pericardial effusion. Mitral Valve: The mitral valve is normal in structure. No evidence of mitral valve regurgitation. No evidence of mitral valve stenosis. Tricuspid Valve: The tricuspid valve is normal in structure. Tricuspid valve regurgitation is trivial. No evidence of tricuspid stenosis. Aortic Valve: The aortic valve is normal in structure. Aortic valve regurgitation is trivial. Aortic regurgitation PHT measures 415 msec. No aortic stenosis is present. Pulmonic Valve: The pulmonic valve was normal in structure. Pulmonic valve regurgitation is mild. No evidence of pulmonic stenosis. Aorta: Aortic dilatation noted. There is severe dilatation of the aortic root, measuring 52 mm. There is mild dilatation of the ascending aorta, measuring 43 mm. Venous: The inferior vena cava is normal in size with greater than 50% respiratory variability, suggesting right atrial pressure of 3 mmHg. IAS/Shunts: No atrial level shunt detected by color flow Doppler. Additional Comments: Severely dilated aortic root (5.2 cm); cannot R/O dissection in aortic arch; suggest CTA or MRA to further assess. Nischal [employer] messaged via secure  chat.  LEFT VENTRICLE PLAX 2D LVIDd:         5.20 cm      Diastology LVIDs:         3.70 cm      LV e' medial:    5.93 cm/s LV PW:         1.20 cm      LV E/e' medial:  8.7 LV IVS:        1.10 cm      LV e' lateral:  6.75 cm/s LVOT diam:     2.50 cm      LV E/e' lateral: 7.6 LV SV:         66 LV SV Index:   29 LVOT Area:     4.91 cm  LV Volumes (MOD) LV vol d, MOD A2C: 141.0 ml LV vol d, MOD A4C: 114.0 ml LV vol s, MOD A2C: 75.2 ml LV vol s, MOD A4C: 55.1 ml LV SV MOD A2C:     65.8 ml LV SV MOD A4C:     114.0 ml LV SV MOD BP:      64.2 ml RIGHT VENTRICLE RV S prime:     9.00 cm/s TAPSE (M-mode): 1.3 cm LEFT ATRIUM             Index        RIGHT ATRIUM           Index LA diam:        2.70 cm 1.18 cm/m   RA Area:     16.60 cm LA Vol (A2C):   50.0 ml 21.92 ml/m  RA Volume:   44.90 ml  19.69 ml/m LA Vol (A4C):   54.0 ml 23.68 ml/m LA Biplane Vol: 52.0 ml 22.80 ml/m  AORTIC VALVE                   PULMONIC VALVE LVOT Vmax:         69.20 cm/s  PR End Diast Vel: 3.00 msec LVOT Vmean:        44.300 cm/s LVOT VTI:          0.135 m AI PHT:            415 msec AR Vena Contracta: 0.20 cm  AORTA Ao Root diam: 5.20 cm Ao Asc diam:  4.30 cm MITRAL VALVE MV Area (PHT): 3.72 cm    SHUNTS MV Decel Time: 204 msec    Systemic VTI:  0.14 m MV E velocity: 51.60 cm/s  Systemic Diam: 2.50 cm MV A velocity: 50.10 cm/s MV E/A ratio:  1.03 Kirk Ruths MD Electronically signed by Kirk Ruths MD Signature Date/Time: 07/14/2022/3:29:15 PM    Final      Discharge Instructions: Discharge Instructions     Call MD for:  persistant nausea and vomiting   Complete by: As directed    Call MD for:  redness, tenderness, or signs of infection (pain, swelling, redness, odor or green/yellow discharge around incision site)   Complete by: As directed    Call MD for:  severe uncontrolled pain   Complete by: As directed    Call MD for:  temperature >100.4   Complete by: As directed    Diet - low sodium heart healthy   Complete by: As  directed    Increase activity slowly   Complete by: As directed        Signed: Drucie Opitz, MD 07/15/2022, 6:06 PM   Pager: [phone]

## 2022-07-15 NOTE — Progress Notes (Signed)
Deflation of the TR band completed and tolerated well. Continue to monitor.

## 2022-07-15 NOTE — Interval H&P Note (Signed)
History and Physical Interval Note:  07/15/2022 11:52 AM  Timothy Browning  has presented today for surgery, with the diagnosis of chest pain.  The various methods of treatment have been discussed with the patient and family. After consideration of risks, benefits and other options for treatment, the patient has consented to  Procedure(s): LEFT HEART CATH AND CORONARY ANGIOGRAPHY (N/A) as a surgical intervention.  The patient's history has been reviewed, patient examined, no change in status, stable for surgery.  I have reviewed the patient's chart and labs.  Questions were answered to the patient's satisfaction.     Kathlyn Sacramento

## 2022-07-15 NOTE — Progress Notes (Signed)
Transported to the cath lab bed awake and alert.

## 2022-07-15 NOTE — H&P (View-Only) (Signed)
  Subjective:  Denies SSCP, palpitations or Dyspnea Long discussion about diagnosis of aneurysm and need To clear coronary arteries with chest pain   Objective:  Vitals:   07/15/22 0100 07/15/22 0300 07/15/22 0400 07/15/22 0800  BP: (!) 150/104 (!) 126/95 (!) 137/102   Pulse: 89 81 80   Resp:  20 11   Temp: 98.4 F (36.9 C) 98.5 F (36.9 C)  98.3 F (36.8 C)  TempSrc: Oral Oral  Oral  SpO2: 93% 93% 93%   Weight:      Height:        Intake/Output from previous day:  Intake/Output Summary (Last 24 hours) at 07/15/2022 0824 Last data filed at 07/15/2022 0300 Gross per 24 hour  Intake --  Output 1650 ml  Net -1650 ml    Physical Exam: Affect appropriate Healthy:  appears stated age HEENT: normal Neck supple with no adenopathy JVP normal no bruits no thyromegaly Lungs clear with no wheezing and good diaphragmatic motion Heart:  S1/S2 no murmur, no rub, gallop or click PMI normal Abdomen: benighn, BS positve, no tenderness, no AAA no bruit.  No HSM or HJR Distal pulses intact with no bruits No edema Neuro non-focal Skin warm and dry No muscular weakness   Lab Results: Basic Metabolic Panel: Recent Labs    07/14/22 1638 07/14/22 2242  NA 136  --   K 3.5  --   CL 105  --   CO2 25  --   GLUCOSE 125*  --   BUN 17  --   CREATININE 1.44*  --   CALCIUM 9.0  --   MG  --  1.8   Liver Function Tests: Recent Labs    07/14/22 1638  AST 20  ALT 22  ALKPHOS 62  BILITOT 0.9  PROT 6.8  ALBUMIN 3.6   No results for input(s): "LIPASE", "AMYLASE" in the last 72 hours. CBC: Recent Labs    07/14/22 1638  WBC 5.6  NEUTROABS 3.0  HGB 15.1  HCT 43.4  MCV 91.2  PLT 315    Hemoglobin A1C: Recent Labs    07/14/22 2242  HGBA1C 5.2   Fasting Lipid Panel: Recent Labs    07/14/22 2242  CHOL 203*  HDL 57  LDLCALC 132*  TRIG 70  CHOLHDL 3.6   Thyroid Function Tests: Recent Labs    07/14/22 2242  TSH 2.196   Anemia Panel: No results for  input(s): "VITAMINB12", "FOLATE", "FERRITIN", "TIBC", "IRON", "RETICCTPCT" in the last 72 hours.  Imaging: CT Angio Chest/Abd/Pel for Dissection W and/or Wo Contrast  Result Date: 07/14/2022 CLINICAL DATA:  Acute aortic syndrome (AAS) suspected poss dissection on ECHO today EXAM: CT ANGIOGRAPHY CHEST, ABDOMEN AND PELVIS TECHNIQUE: Non-contrast CT of the chest was initially obtained. Multidetector CT imaging through the chest, abdomen and pelvis was performed using the standard protocol during bolus administration of intravenous contrast. Multiplanar reconstructed images and MIPs were obtained and reviewed to evaluate the vascular anatomy. RADIATION DOSE REDUCTION: This exam was performed according to the departmental dose-optimization program which includes automated exposure control, adjustment of the mA and/or kV according to patient size and/or use of iterative reconstruction technique. CONTRAST:  75mL OMNIPAQUE IOHEXOL 350 MG/ML SOLN COMPARISON:  None Available. FINDINGS: CTA CHEST FINDINGS Cardiovascular: Aneurysmal dilatation of the aortic root measuring 5.1 cm at the sinuses of Valsalva. Ascending thoracic aorta measures 3.5 cm. Descending thoracic aorta measures 3.0 cm. No evidence of dissection. Heart is normal size. Mediastinum/Nodes: No mediastinal, hilar, or axillary   adenopathy. Trachea and esophagus are unremarkable. Thyroid unremarkable. Lungs/Pleura: No confluent opacities or effusions. Musculoskeletal: Chest wall soft tissues are unremarkable. No acute bony abnormality. Review of the MIP images confirms the above findings. CTA ABDOMEN AND PELVIS FINDINGS VASCULAR Aorta: Normal caliber aorta without aneurysm, dissection, vasculitis or significant stenosis. Celiac: Patent without evidence of aneurysm, dissection, vasculitis or significant stenosis. SMA: Patent without evidence of aneurysm, dissection, vasculitis or significant stenosis. Renals: Both renal arteries are patent without evidence of  aneurysm, dissection, vasculitis, fibromuscular dysplasia or significant stenosis. IMA: Patent without evidence of aneurysm, dissection, vasculitis or significant stenosis. Inflow: Patent without evidence of aneurysm, dissection, vasculitis or significant stenosis. Veins: No obvious venous abnormality within the limitations of this arterial phase study. Review of the MIP images confirms the above findings. NON-VASCULAR Hepatobiliary: No focal liver abnormality is seen. Status post cholecystectomy. No biliary dilatation. Pancreas: No focal abnormality or ductal dilatation. Spleen: Prior splenectomy. Accessory splenules noted in the left upper quadrant. Adrenals/Urinary Tract: 3 mm nonobstructing stone in the midpole of the left kidney. No right renal stones or ureteral stones bilaterally. No hydronephrosis. Adrenal glands and urinary bladder unremarkable. Stomach/Bowel: Colonic diverticulosis most pronounced in the sigmoid colon and right colon. No evidence of active diverticulitis. Stomach and small bowel grossly unremarkable. No obstruction. Lymphatic: No adenopathy Reproductive: No visible focal abnormality. Other: No free fluid or free air. Musculoskeletal: Old healed posttraumatic changes in the left in the pelvis. No acute bony abnormality. Review of the MIP images confirms the above findings. 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 Recommend semi-annual imaging followup by CTA or MRA and referral to cardiothoracic surgery if not already obtained. This recommendation follows 2010 ACCF/AHA/AATS/ACR/ASA/SCA/SCAI/SIR/STS/SVM Guidelines for the Diagnosis and Management of Patients With Thoracic Aortic Disease. Circulation. 2010; 121: P824-M353. Aortic aneurysm NOS (ICD10-I71.9) No evidence of dissection. No acute cardiopulmonary disease. Colonic diverticulosis.  No active diverticulitis. Electronically Signed   By: Rolm Baptise M.D.   On: 07/14/2022 17:26   ECHOCARDIOGRAM COMPLETE  Result Date: 07/14/2022    ECHOCARDIOGRAM REPORT   Patient Name:   Timothy Browning Date of Exam: 07/14/2022 Medical Rec #:  [id number]       Height:       72.0 in Accession #:    [id number]      Weight:       234.8 lb Date of Birth:  02-May-1971       BSA:          2.281 m Patient Age:    29 years        BP:           147/95 mmHg Patient Gender: M               HR:           73 bpm. Exam Location:  Outpatient Procedure: 2D Echo, Cardiac Doppler and Color Doppler Indications:    Other abnormalities of the heart R00.8  History:        Patient has no prior history of Echocardiogram examinations.                 Risk Factors:GERD.  Sonographer:    Bernadene Person RDCS Referring Phys: [id number] [facility] NARENDRA IMPRESSIONS  1. Severely dilated aortic root (5.2 cm); cannot R/O dissection in aortic arch; suggest CTA or MRA to further assess. [employer] via secure chat.  2. Left ventricular ejection  fraction, by estimation, is 45 to 50%. The left ventricle has mildly decreased function. The left ventricle demonstrates global hypokinesis. There is mild concentric left ventricular hypertrophy. Left ventricular diastolic parameters are consistent with Grade I diastolic dysfunction (impaired relaxation).  3. Right ventricular systolic function is normal. The right ventricular size is normal.  4. The mitral valve is normal in structure. No evidence of mitral valve regurgitation. No evidence of mitral stenosis.  5. The aortic valve is normal in structure. Aortic valve regurgitation is trivial. No aortic stenosis is present.  6. Aortic dilatation noted. There is severe dilatation of the aortic root, measuring 52 mm. There is mild dilatation of the ascending aorta, measuring 43 mm.  7. The inferior vena cava is normal in size with greater than 50% respiratory variability, suggesting right atrial pressure of 3 mmHg. Comparison(s): No prior Echocardiogram.  FINDINGS  Left Ventricle: Left ventricular ejection fraction, by estimation, is 45 to 50%. The left ventricle has mildly decreased function. The left ventricle demonstrates global hypokinesis. The left ventricular internal cavity size was normal in size. There is  mild concentric left ventricular hypertrophy. Left ventricular diastolic parameters are consistent with Grade I diastolic dysfunction (impaired relaxation). Right Ventricle: The right ventricular size is normal. Right ventricular systolic function is normal. Left Atrium: Left atrial size was normal in size. Right Atrium: Right atrial size was normal in size. Pericardium: There is no evidence of pericardial effusion. Mitral Valve: The mitral valve is normal in structure. No evidence of mitral valve regurgitation. No evidence of mitral valve stenosis. Tricuspid Valve: The tricuspid valve is normal in structure. Tricuspid valve regurgitation is trivial. No evidence of tricuspid stenosis. Aortic Valve: The aortic valve is normal in structure. Aortic valve regurgitation is trivial. Aortic regurgitation PHT measures 415 msec. No aortic stenosis is present. Pulmonic Valve: The pulmonic valve was normal in structure. Pulmonic valve regurgitation is mild. No evidence of pulmonic stenosis. Aorta: Aortic dilatation noted. There is severe dilatation of the aortic root, measuring 52 mm. There is mild dilatation of the ascending aorta, measuring 43 mm. Venous: The inferior vena cava is normal in size with greater than 50% respiratory variability, suggesting right atrial pressure of 3 mmHg. IAS/Shunts: No atrial level shunt detected by color flow Doppler. Additional Comments: Severely dilated aortic root (5.2 cm); cannot R/O dissection in aortic arch; suggest CTA or MRA to further assess. [employer] via secure chat.  LEFT VENTRICLE PLAX 2D LVIDd:         5.20 cm      Diastology LVIDs:         3.70 cm      LV e' medial:    5.93 cm/s LV PW:         1.20 cm       LV E/e' medial:  8.7 LV IVS:        1.10 cm      LV e' lateral:   6.75 cm/s LVOT diam:     2.50 cm      LV E/e' lateral: 7.6 LV SV:         66 LV SV Index:   29 LVOT Area:     4.91 cm  LV Volumes (MOD) LV vol d, MOD A2C: 141.0 ml LV vol d, MOD A4C: 114.0 ml LV vol s, MOD A2C: 75.2 ml LV vol s, MOD A4C: 55.1 ml LV SV MOD A2C:     65.8 ml LV SV MOD A4C:     114.0  ml LV SV MOD BP:      64.2 ml RIGHT VENTRICLE RV S prime:     9.00 cm/s TAPSE (M-mode): 1.3 cm LEFT ATRIUM             Index        RIGHT ATRIUM           Index LA diam:        2.70 cm 1.18 cm/m   RA Area:     16.60 cm LA Vol (A2C):   50.0 ml 21.92 ml/m  RA Volume:   44.90 ml  19.69 ml/m LA Vol (A4C):   54.0 ml 23.68 ml/m LA Biplane Vol: 52.0 ml 22.80 ml/m  AORTIC VALVE                   PULMONIC VALVE LVOT Vmax:         69.20 cm/s  PR End Diast Vel: 3.00 msec LVOT Vmean:        44.300 cm/s LVOT VTI:          0.135 m AI PHT:            415 msec AR Vena Contracta: 0.20 cm  AORTA Ao Root diam: 5.20 cm Ao Asc diam:  4.30 cm MITRAL VALVE MV Area (PHT): 3.72 cm    SHUNTS MV Decel Time: 204 msec    Systemic VTI:  0.14 m MV E velocity: 51.60 cm/s  Systemic Diam: 2.50 cm MV A velocity: 50.10 cm/s MV E/A ratio:  1.03 Kirk Ruths MD Electronically signed by Kirk Ruths MD Signature Date/Time: 07/14/2022/3:29:15 PM    Final     Cardiac Studies:  ECG:  SR Nonspecific ST changes    Telemetry:  NSR   Echo: Ao 5.2 cm tri leaflet AV EF 45-50% trivial AR  Medications:    enoxaparin (LOVENOX) injection  40 mg Subcutaneous Q24H   metoprolol succinate  25 mg Oral Daily   sodium chloride flush  3 mL Intravenous Q12H      Assessment/Plan:   Chest Pain:  atypical no acute ECG changes Admitted after TTE noted Ascending thoracic aneurysm. No dissection on gated CTA. Unfortunately cannot clear cors on CT due to respiratory artifact , lack of beta blockade and nitro. He has no calcium on his arteries Doubt obstructive CAD. Discussed risks of cath  including stroke, bleeding, need for emergency surgery and radial artery injury Willing to proceed orders written on board for TK Aneurysm:  maternal grand mother had AAA. 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

## 2022-07-15 NOTE — TOC Progression Note (Signed)
Transition of Care [facility]) - Progression Note    Patient Details  Name: Timothy Browning MRN: [id number] Date of Birth: 15-Jan-1971  Transition of Care [facility]) CM/SW [place], RN Phone Number:[phone]  07/15/2022, 3:22 PM  Clinical Narrative:     Transition of Care [facility]) Screening Note   Patient Details  Name: Timothy Browning Date of Birth: 1971/07/27   Transition of Care [facility]) CM/SW Contact:    Angelita Ingles, RN Phone Number: 07/15/2022, 3:22 PM    Transition of Care Department [facility]) has reviewed patient and no TOC needs have been identified at this time. We will continue to monitor patient advancement through interdisciplinary progression rounds. If new patient transition needs arise, please place a TOC consult.          Expected Discharge Plan and Services                                                 Social Determinants of Health (SDOH) Interventions    Readmission Risk Interventions     No data to display

## 2022-07-15 NOTE — Discharge Instructions (Signed)
Timothy Browning, it was a pleasure being part of your care in the hospital.  You were found to have something called in aortic aneurysm, measuring 5.1 cm in size.  At this point in time, its not something that we need to do any surgery to fix.  I highly highly recommend to start reducing the amount of caffeine you drink, and start taking it easy when it comes to working out.  Please refrain from using the preworkout you are using.  You have an appointment scheduled with the internal medicine clinic at the end of October, please attend and they will follow-up on your hospital visit.

## 2022-07-15 NOTE — Progress Notes (Signed)
Back from the cath lab by bed awake and alert. TR band to right wrist intact. Instructed to avoid moving affected arm . Elevated with pillow.

## 2022-07-16 ENCOUNTER — Telehealth: Payer: Self-pay

## 2022-07-16 ENCOUNTER — Encounter: Payer: Self-pay | Admitting: Internal Medicine

## 2022-07-16 DIAGNOSIS — I5022 Chronic systolic (congestive) heart failure: Secondary | ICD-10-CM | POA: Insufficient documentation

## 2022-07-16 MED FILL — Lidocaine HCl Local Preservative Free (PF) Inj 1%: INTRAMUSCULAR | Qty: 30 | Status: AC

## 2022-07-16 NOTE — Patient Outreach (Signed)
  Care Coordination TOC Note Transition Care Management Follow-up Telephone Call Date of discharge and from where[facility]22 How have you been since you were released from the hospital? "I am still feeling very tired, I do not have any pain at this time." Any questions or concerns? Yes- Patient has questions about his aneurysm and if he is going to pass from it.  This RNCM advised patient he needs to discuss the ongoing plan for treatment with his doctor. He is questioning whether he needs to have the aneurysm repaired now, before it gets too big.  Items Reviewed: Did the pt receive and understand the discharge instructions provided? Yes  Medications obtained and verified? Yes  Other? No  Any new allergies since your discharge? No  Dietary orders reviewed? No Do you have support at home? Yes   Home Care and Equipment/Supplies: Were home health services ordered? no If so, what is the name of the agency? N/A  Has the agency set up a time to come to the patient's home? not applicable Were any new equipment or medical supplies ordered?  No What is the name of the medical supply agency? N/A Were you able to get the supplies/equipment? not applicable Do you have any questions related to the use of the equipment or supplies? No  Functional Questionnaire: (I = Independent and D = Dependent) ADLs: I  Bathing/Dressing- I  Meal Prep- I  Eating- I  Maintaining continence- I  Transferring/Ambulation- I  Managing Meds- I  Follow up appointments reviewed:  PCP Hospital f/u appt confirmed? Yes  Scheduled to see Dr. Stann Mainland on 08/10/22 @ 10:15.- Message sent to [facility] front desk staff as he needs to be seen next week. [place] [facility] f/u appt confirmed? No   Are transportation arrangements needed? No  If their condition worsens, is the pt aware to call PCP or go to the Emergency Dept.? Yes Was the patient provided with contact information for the PCP's office or ED? Yes Was to pt  encouraged to call back with questions or concerns? Yes  SDOH assessments and interventions completed:   Yes  Care Coordination Interventions Activated:  Yes   Care Coordination Interventions:  PCP follow up appointment requested   Encounter Outcome:  Pt. Visit Completed

## 2022-07-22 NOTE — Progress Notes (Signed)
Unknown if acute or chronic Mildly reduced systolic heart failure

## 2022-07-29 ENCOUNTER — Ambulatory Visit (INDEPENDENT_AMBULATORY_CARE_PROVIDER_SITE_OTHER): Payer: Self-pay | Admitting: Internal Medicine

## 2022-07-29 ENCOUNTER — Other Ambulatory Visit (HOSPITAL_COMMUNITY): Payer: Self-pay

## 2022-07-29 VITALS — BP 128/87 | HR 88 | Wt 235.0 lb

## 2022-07-29 DIAGNOSIS — R079 Chest pain, unspecified: Secondary | ICD-10-CM

## 2022-07-29 DIAGNOSIS — Z5989 Other problems related to housing and economic circumstances: Secondary | ICD-10-CM

## 2022-07-29 DIAGNOSIS — I7121 Aneurysm of the ascending aorta, without rupture: Secondary | ICD-10-CM

## 2022-07-29 DIAGNOSIS — I5022 Chronic systolic (congestive) heart failure: Secondary | ICD-10-CM

## 2022-07-29 MED ORDER — LOSARTAN POTASSIUM 25 MG PO TABS
25.0000 mg | ORAL_TABLET | Freq: Every day | ORAL | 1 refills | Status: DC
  Filled 2022-07-29: qty 30, 30d supply, fill #0
  Filled 2022-09-07: qty 30, 30d supply, fill #1

## 2022-07-29 MED ORDER — METOPROLOL SUCCINATE ER 25 MG PO TB24
25.0000 mg | ORAL_TABLET | Freq: Every day | ORAL | 3 refills | Status: DC
  Filled 2022-07-29: qty 30, 30d supply, fill #0

## 2022-07-29 NOTE — Progress Notes (Signed)
   CC: hfu  HPI:[name] is a 51 y.o. male who presents for evaluation of chest pain. Please see individual problem based A/P for details.  Patient reports continued intermittent chest pain after recent cath. Pain feels about the same. Denies nausea, diaphoresis or shob. No LEE. Wanting referral to cardiology. Frustrated that he has not been able to see specialist.  Working on orange card and needs help.    Depression, PHQ-9: Based on the patients  [place] Visit from 07/02/2022 in [place]  PHQ-9 Total Score 4      score we have .  Past Medical History:  Diagnosis Date   Acute cholecystitis 12/30/2016   Chest pain of uncertain etiology 17/71/1657   CKD (chronic kidney disease)    GERD (gastroesophageal reflux disease)    Review of Systems:   See hpi  Physical Exam: Vitals:   07/29/22 1048  BP: 128/87  Pulse: 88  SpO2: 99%  Weight: 235 lb (106.6 kg)     General: nad HEENT: Conjunctiva nl , antiicteric sclerae, moist mucous membranes, no exudate or erythema Cardiovascular: Normal rate, regular rhythm.  No murmurs, rubs, or gallops Pulmonary : Equal breath sounds, No wheezes, rales, or rhonchi Abdominal: soft, nontender,  bowel sounds present Ext: No edema in lower extremities, no tenderness to palpation of lower extremities.   Assessment & Plan:   See Encounters Tab for problem based charting.  Referral placed to cardiology.  Social work consulted to assist with orange card.   Patient discussed with Dr. Evette Doffing

## 2022-07-29 NOTE — Patient Instructions (Signed)
Dear Timothy Browning,  Thank you for trusting [place] with your care today. We talked about your heart and the aneurysm.  We have sent in prescriptions for the metoprolol and for losartan 25mg  to be taken daily. Please continue to monitor your blood pressure. I have placed a referral to cardiology. I have also placed a referral to social work to help with the orange card.   Please return in 3 weeks to follow up.

## 2022-08-01 ENCOUNTER — Encounter: Payer: Self-pay | Admitting: Internal Medicine

## 2022-08-01 NOTE — Assessment & Plan Note (Signed)
Patient reports continued intermittent chest pain after recent cath. Pain feels about the same. Denies nausea, diaphoresis or shob. No LEE. Wanting referral to cardiology. Frustrated that he has not been able to see specialist.  Working on orange card and needs help.   RRR, no LEE, no crackles, no murmurs.  Referral placed to cardiology.  Social work consulted to assist with orange card.

## 2022-08-01 NOTE — Assessment & Plan Note (Signed)
Needs semi-annual f/u.

## 2022-08-01 NOTE — Assessment & Plan Note (Signed)
He has not been able to get metoprolol. Asymptotic. No evidence of heart failure exacerbation at this time.  Will reorder metop. Would also benefit from losartan 25.

## 2022-08-03 NOTE — Progress Notes (Signed)
Internal Medicine Clinic Attending ° °Case discussed with Dr. Gawaluck  At the time of the visit.  We reviewed the resident’s history and exam and pertinent patient test results.  I agree with the assessment, diagnosis, and plan of care documented in the resident’s note.  °

## 2022-08-12 ENCOUNTER — Other Ambulatory Visit (HOSPITAL_COMMUNITY): Payer: Self-pay

## 2022-08-12 ENCOUNTER — Ambulatory Visit: Payer: Medicaid Other | Attending: Cardiology | Admitting: Cardiology

## 2022-08-12 ENCOUNTER — Encounter: Payer: Self-pay | Admitting: Cardiology

## 2022-08-12 VITALS — BP 126/98 | HR 80 | Ht 72.0 in | Wt 232.0 lb

## 2022-08-12 DIAGNOSIS — I10 Essential (primary) hypertension: Secondary | ICD-10-CM

## 2022-08-12 DIAGNOSIS — I5022 Chronic systolic (congestive) heart failure: Secondary | ICD-10-CM

## 2022-08-12 DIAGNOSIS — E782 Mixed hyperlipidemia: Secondary | ICD-10-CM

## 2022-08-12 DIAGNOSIS — Z01812 Encounter for preprocedural laboratory examination: Secondary | ICD-10-CM

## 2022-08-12 DIAGNOSIS — I7121 Aneurysm of the ascending aorta, without rupture: Secondary | ICD-10-CM

## 2022-08-12 DIAGNOSIS — R0989 Other specified symptoms and signs involving the circulatory and respiratory systems: Secondary | ICD-10-CM

## 2022-08-12 MED ORDER — ROSUVASTATIN CALCIUM 5 MG PO TABS
5.0000 mg | ORAL_TABLET | Freq: Every day | ORAL | 2 refills | Status: DC
  Filled 2022-08-12: qty 30, 30d supply, fill #0

## 2022-08-12 MED ORDER — ROSUVASTATIN CALCIUM 5 MG PO TABS: 5.0000 mg | ORAL_TABLET | Freq: Every day | ORAL | 2 refills | Status: DC

## 2022-08-12 MED ORDER — METOPROLOL SUCCINATE ER 25 MG PO TB24
12.5000 mg | ORAL_TABLET | Freq: Every day | ORAL | 3 refills | Status: DC
  Filled 2022-08-12: qty 15, 30d supply, fill #0

## 2022-08-12 MED ORDER — METOPROLOL SUCCINATE ER 25 MG PO TB24: 12.5000 mg | ORAL_TABLET | Freq: Every day | ORAL | 3 refills | Status: DC

## 2022-08-12 NOTE — Patient Instructions (Addendum)
Medication Instructions:  Your physician has recommended you make the following change in your medication: DECREASE: Metoprolol 12.5mg  daily START: Rosuvastatin 5mg  daily  *If you need a refill on your cardiac medications before your next appointment, please call your pharmacy*   Lab Work: NONE If you have labs (blood work) drawn today and your tests are completely normal, you will receive your results only by: [place] (if you have MyChart) OR A paper copy in the mail If you have any lab test that is abnormal or we need to change your treatment, we will call you to review the results.   Testing/Procedures: Non-Cardiac CT Angiography (CTA), is a special type of CT scan that uses a computer to produce multi-dimensional views of major blood vessels throughout the body. In CT angiography, a contrast material is injected through an IV to help visualize the blood vessels THIS WILL BE IN 3 MONTHS.    Follow-Up: At [facility], you and your health needs are our priority.  As part of our continuing mission to provide you with exceptional heart care, we have created designated Provider Care Teams.  These Care Teams include your primary Cardiologist (physician) and Advanced Practice Providers (APPs -  Physician Assistants and Nurse Practitioners) who all work together to provide you with the care you need, when you need it.  We recommend signing up for the patient portal called "MyChart".  Sign up information is provided on this After Visit Summary.  MyChart is used to connect with patients for Virtual Visits (Telemedicine).  Patients are able to view lab/test results, encounter notes, upcoming appointments, etc.  Non-urgent messages can be sent to your provider as well.   To learn more about what you can do with MyChart, go to [email or web address].    Your next appointment:   3 month(s)  The format for your next appointment:   In Person  Provider:   Berniece Salines, DO
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
## 2022-08-13 ENCOUNTER — Other Ambulatory Visit (HOSPITAL_COMMUNITY): Payer: Self-pay

## 2022-08-19 ENCOUNTER — Encounter: Payer: Medicaid Other | Admitting: Internal Medicine

## 2022-08-26 ENCOUNTER — Ambulatory Visit (INDEPENDENT_AMBULATORY_CARE_PROVIDER_SITE_OTHER): Payer: Self-pay | Admitting: Internal Medicine

## 2022-08-26 ENCOUNTER — Encounter: Payer: Self-pay | Admitting: Internal Medicine

## 2022-08-26 VITALS — BP 121/90 | HR 84 | Ht 72.0 in | Wt 237.5 lb

## 2022-08-26 DIAGNOSIS — R002 Palpitations: Secondary | ICD-10-CM

## 2022-08-26 DIAGNOSIS — I7121 Aneurysm of the ascending aorta, without rupture: Secondary | ICD-10-CM

## 2022-08-26 DIAGNOSIS — I5022 Chronic systolic (congestive) heart failure: Secondary | ICD-10-CM

## 2022-08-26 DIAGNOSIS — R5383 Other fatigue: Secondary | ICD-10-CM

## 2022-08-26 MED ORDER — METOPROLOL SUCCINATE ER 25 MG PO TB24: 25.0000 mg | ORAL_TABLET | Freq: Every day | ORAL | 3 refills | Status: DC

## 2022-08-26 NOTE — Progress Notes (Unsigned)
Subjective:  CC: palpitations  HPI:  Timothy Browning is a 51 y.o. male with a past medical history stated below and presents today for follow-up on hypertension. He was found to have mildly reduced EF and a 5.1 cm aneurysm in 9/23 and was admitted to hospital. Since discharge he followed up with [facility] and established care with cardiology, Dr. Servando Salina. He is doing well on new blood pressure medications, but does continue to have occasional episodes of heart palpitations. Please see problem based assessment and plan for additional details.  Past Medical History:  Diagnosis Date   Aortic aneurysm without rupture (HCC)    Chest pain of uncertain etiology 08/11/2021   CKD (chronic kidney disease)    GERD (gastroesophageal reflux disease)    Heart failure (HCC)    Obesity (BMI 30-39.9) 08/11/2021    Current Outpatient Medications on File Prior to Visit  Medication Sig Dispense Refill   losartan (COZAAR) 25 MG tablet Take 1 tablet (25 mg total) by mouth daily. 30 tablet 1   rosuvastatin (CRESTOR) 5 MG tablet Take 1 tablet (5 mg total) by mouth daily. 90 tablet 2   sildenafil (VIAGRA) 25 MG tablet Take 100 mg by mouth daily as needed for erectile dysfunction.     No current facility-administered medications on file prior to visit.    Family History  Problem Relation Age of Onset   Cancer Sister    Hearing loss Maternal Grandmother    Cancer Maternal Grandfather     Social History   Socioeconomic History   Marital status: Single    Spouse name: n/a   Number of children: 3   Years of education: Not on file   Highest education level: Not on file  Occupational History   Occupation: painter    Comment: unemployed  Tobacco Use   Smoking status: Never   Smokeless tobacco: Never  Substance and Sexual Activity   Alcohol use: Yes    Alcohol/week: 0.0 standard drinks of alcohol    Comment: weekends, 1 pint   Drug use: No   Sexual activity: Not on file  Other Topics Concern    Not on file  Social History Narrative   Not on file   Social Determinants of Health   Financial Resource Strain: Not on file  Food Insecurity: Food Insecurity Present (07/15/2022)   Hunger Vital Sign    Worried About Running Out of Food in the Last Year: Sometimes true    Ran Out of Food in the Last Year: Sometimes true  Transportation Needs: No Transportation Needs (07/16/2022)   PRAPARE - Administrator, Civil Service (Medical): No    Lack of Transportation (Non-Medical): No  Physical Activity: Not on file  Stress: Not on file  Social Connections: Not on file  Intimate Partner Violence: Not At Risk (07/15/2022)   Humiliation, Afraid, Rape, and Kick questionnaire    Fear of Current or Ex-Partner: No    Emotionally Abused: No    Physically Abused: No    Sexually Abused: No    Review of Systems: ROS negative except for what is noted on the assessment and plan.  Objective:   Vitals:   08/26/22 0945  BP: (!) 121/90  Pulse: 84  SpO2: 98%  Weight: 237 lb 8 oz (107.7 kg)  Height: 6' (1.829 m)    Physical Exam: Constitutional: well-appearing  Cardiovascular: regular rate and rhythm, no m/r/g Pulmonary/Chest: normal work of breathing on room air, lungs clear to auscultation  bilaterally MSK: normal bulk and tone, no lower extremity edema present Skin: warm and dry  Assessment & Plan:  Aortic aneurysm without rupture [facility]) Dr. Servando Salina w cards planning to repeat CTA in 1/24. He has follow-up with CT surgery team 11/23. He is tolerating blood pressure medications and started metoprolol. Blood pressure at 121/90.  A/P: Diastolic remains elevated and he continues to have episodes of palpitations. Will increase beta blocker -Continue losartan 25 mg -increase toprol to 25 mg  Palpitations He continues to have palpitations. These occur randomly and also cause shortness of breath. No signs of heart arrythmia at hospitilization. He has stopped using pre workout  powder. A: It is likely that he is having PVCs. Afib was not seen on heart monitor during admission and has not been seen on EKGs.  P: Increase metoprolol to 25 mg  Chronic systolic congestive heart failure (HCC) Unknown cause of HFmrEF. He is tolerating losartan and metoprolol well. No symptoms of hypervolemia.  -continue losartan and increase metoprolol 25 mg -Follow-up in 4 week to recheck BP  Fatigue TSH wnl during admission. STOP-BANG score elevated. He would like to complete home sleep study after medicaid is approved. P: -will plan to order home sleep study after insurance     Patient discussed with Dr. Marjorie Smolder Juanya Villavicencio, D.O. [facility]  PGY-2 Pager: [phone]  Phone: [phone] Date 08/27/2022  Time 4:19 PM

## 2022-08-26 NOTE — Patient Instructions (Signed)
Thank you, Timothy Browning for allowing [place] to provide your care today.   Your blood pressure is still above goal. Our goal is to have top # less than 120 and bottom number less than 80. I am increasing metoprolol to 25 mg.  Sleep study I would like to have home test completed. Let me check with the company we work through on how to get this scheduled  I have ordered the following medication/changed the following medications:   Stop the following medications: Medications Discontinued During This Encounter  Medication Reason   metoprolol succinate (TOPROL-XL) 25 MG 24 hr tablet Reorder     Start the following medications: Meds ordered this encounter  Medications   metoprolol succinate (TOPROL-XL) 25 MG 24 hr tablet    Sig: Take 1 tablet (25 mg total) by mouth daily.    Dispense:  90 tablet    Refill:  3    IM program     Follow up:  1 month  we will recheck cholesterol and blood pressure at that time.  We look forward to seeing you next time. Please call our clinic at [phone] if you have any questions or concerns. The best time to call is Monday-Friday from 9am-4pm, but there is someone available 24/7. If after hours or the weekend, call the main hospital number and ask for the Internal Medicine Resident On-Call. If you need medication refills, please notify your pharmacy one week in advance and they will send [place] a request.   Thank you for trusting me with your care. Wishing you the best!   Rudene Christians, DO [facility]

## 2022-08-27 ENCOUNTER — Encounter: Payer: Self-pay | Admitting: Internal Medicine

## 2022-08-27 DIAGNOSIS — R5383 Other fatigue: Secondary | ICD-10-CM | POA: Insufficient documentation
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
## 2022-08-27 NOTE — Assessment & Plan Note (Addendum)
Unknown cause of HFmrEF. He is tolerating losartan and metoprolol well. No symptoms of hypervolemia.  -continue losartan and increase metoprolol 25 mg -Follow-up in 4 week to recheck BP

## 2022-08-27 NOTE — Assessment & Plan Note (Signed)
TSH wnl during admission. STOP-BANG score elevated. He would like to complete home sleep study after medicaid is approved. P: -will plan to order home sleep study after insurance

## 2022-08-27 NOTE — Assessment & Plan Note (Addendum)
He continues to have palpitations. These occur randomly and also cause shortness of breath. No signs of heart arrythmia at hospitilization. He has stopped using pre workout powder. A: It is likely that he is having PVCs. Afib was not seen on heart monitor during admission and has not been seen on EKGs.  P: Increase metoprolol to 25 mg

## 2022-08-27 NOTE — Assessment & Plan Note (Signed)
Dr. Servando Salina w cards planning to repeat CTA in 1/24. He has follow-up with CT surgery team 11/23. He is tolerating blood pressure medications and started metoprolol. Blood pressure at 121/90.  A/P: Diastolic remains elevated and he continues to have episodes of palpitations. Will increase beta blocker -Continue losartan 25 mg -increase toprol to 25 mg

## 2022-08-28 ENCOUNTER — Encounter: Payer: Medicaid Other | Admitting: Thoracic Surgery (Cardiothoracic Vascular Surgery)

## 2022-08-28 ENCOUNTER — Institutional Professional Consult (permissible substitution) (INDEPENDENT_AMBULATORY_CARE_PROVIDER_SITE_OTHER): Payer: Self-pay | Admitting: Thoracic Surgery (Cardiothoracic Vascular Surgery)

## 2022-08-28 VITALS — BP 139/95 | HR 76 | Resp 18 | Ht 72.0 in | Wt 237.0 lb

## 2022-08-28 DIAGNOSIS — I7121 Aneurysm of the ascending aorta, without rupture: Secondary | ICD-10-CM

## 2022-08-31 NOTE — Progress Notes (Signed)
Internal Medicine Clinic Attending  Case discussed with Dr. Masters  At the time of the visit.  We reviewed the resident's history and exam and pertinent patient test results.  I agree with the assessment, diagnosis, and plan of care documented in the resident's note.  

## 2022-09-08 ENCOUNTER — Other Ambulatory Visit (HOSPITAL_COMMUNITY): Payer: Self-pay

## 2022-09-23 ENCOUNTER — Other Ambulatory Visit: Payer: Self-pay

## 2022-09-23 ENCOUNTER — Encounter: Payer: Self-pay | Admitting: Internal Medicine

## 2022-09-23 ENCOUNTER — Ambulatory Visit (INDEPENDENT_AMBULATORY_CARE_PROVIDER_SITE_OTHER): Payer: Self-pay | Admitting: Internal Medicine

## 2022-09-23 ENCOUNTER — Other Ambulatory Visit (HOSPITAL_COMMUNITY): Payer: Self-pay

## 2022-09-23 VITALS — BP 127/90 | HR 83 | Temp 98.4°F | Ht 72.0 in | Wt 237.7 lb

## 2022-09-23 DIAGNOSIS — I7121 Aneurysm of the ascending aorta, without rupture: Secondary | ICD-10-CM

## 2022-09-23 DIAGNOSIS — M545 Low back pain, unspecified: Secondary | ICD-10-CM

## 2022-09-23 DIAGNOSIS — R002 Palpitations: Secondary | ICD-10-CM

## 2022-09-23 DIAGNOSIS — R5383 Other fatigue: Secondary | ICD-10-CM

## 2022-09-23 DIAGNOSIS — I5022 Chronic systolic (congestive) heart failure: Secondary | ICD-10-CM

## 2022-09-23 DIAGNOSIS — R030 Elevated blood-pressure reading, without diagnosis of hypertension: Secondary | ICD-10-CM

## 2022-09-23 DIAGNOSIS — G8929 Other chronic pain: Secondary | ICD-10-CM

## 2022-09-23 MED ORDER — METOPROLOL SUCCINATE ER 50 MG PO TB24
50.0000 mg | ORAL_TABLET | Freq: Every day | ORAL | 11 refills | Status: DC
  Filled 2022-09-23: qty 30, 30d supply, fill #0

## 2022-09-23 MED ORDER — DULOXETINE HCL 30 MG PO CPEP
30.0000 mg | ORAL_CAPSULE | Freq: Every day | ORAL | 2 refills | Status: DC
  Filled 2022-09-23: qty 30, 30d supply, fill #0

## 2022-09-23 MED ORDER — LOSARTAN POTASSIUM 25 MG PO TABS
25.0000 mg | ORAL_TABLET | Freq: Every day | ORAL | 3 refills | Status: DC
  Filled 2022-09-23: qty 90, 90d supply, fill #0
  Filled 2022-10-05: qty 30, 30d supply, fill #0
  Filled 2022-11-02: qty 30, 30d supply, fill #1
  Filled 2022-12-02: qty 30, 30d supply, fill #2
  Filled 2023-01-01: qty 30, 30d supply, fill #3
  Filled 2023-02-01: qty 30, 30d supply, fill #4
  Filled 2023-03-02: qty 30, 30d supply, fill #5
  Filled 2023-04-01: qty 30, 30d supply, fill #6
  Filled 2023-04-30: qty 30, 30d supply, fill #7

## 2022-09-23 MED ORDER — ROSUVASTATIN CALCIUM 5 MG PO TABS
5.0000 mg | ORAL_TABLET | Freq: Every day | ORAL | 3 refills | Status: DC
  Filled 2022-09-23: qty 30, 30d supply, fill #0
  Filled 2022-12-02: qty 30, 30d supply, fill #1
  Filled 2022-12-31: qty 30, 30d supply, fill #2
  Filled 2023-02-01: qty 30, 30d supply, fill #3
  Filled 2023-03-02: qty 30, 30d supply, fill #4
  Filled 2023-06-08: qty 30, 30d supply, fill #5
  Filled 2023-07-12: qty 30, 30d supply, fill #6
  Filled 2023-08-16: qty 30, 30d supply, fill #7
  Filled 2023-09-10: qty 30, 30d supply, fill #8

## 2022-09-23 MED ORDER — METOPROLOL SUCCINATE ER 25 MG PO TB24
25.0000 mg | ORAL_TABLET | Freq: Every day | ORAL | 3 refills | Status: DC
  Filled 2022-09-23: qty 30, 30d supply, fill #0
  Filled 2022-11-02: qty 30, 30d supply, fill #1
  Filled 2022-12-02: qty 30, 30d supply, fill #2
  Filled 2022-12-31: qty 30, 30d supply, fill #3
  Filled 2023-02-01: qty 30, 30d supply, fill #4
  Filled 2023-03-02: qty 30, 30d supply, fill #5
  Filled 2023-04-01: qty 30, 30d supply, fill #6
  Filled 2023-04-30: qty 30, 30d supply, fill #7

## 2022-09-23 NOTE — Patient Instructions (Addendum)
Thank you, [name] for allowing [place] to provide your care today.   Palpitations: Continue metoprolol 25 mg. If palpitations become more bothersome, please call the clinic.  Back pain In the future we could consider trying duloxetine for your back pain. If this is still bothersome at follow-up in then we could refer to physical therapy or try duloxetine.  I have ordered the following medication/changed the following medications:   Stop the following medications: Medications Discontinued During This Encounter  Medication Reason   metoprolol succinate (TOPROL-XL) 25 MG 24 hr tablet Change in therapy   losartan (COZAAR) 25 MG tablet Reorder   rosuvastatin (CRESTOR) 5 MG tablet Reorder     Start the following medications: Meds ordered this encounter  Medications   losartan (COZAAR) 25 MG tablet    Sig: Take 1 tablet (25 mg total) by mouth daily.    Dispense:  90 tablet    Refill:  3    IM program   rosuvastatin (CRESTOR) 5 MG tablet    Sig: Take 1 tablet (5 mg total) by mouth daily.    Dispense:  90 tablet    Refill:  3    IM program per secure chat with Dr. Servando Salina mdw   metoprolol succinate (TOPROL XL) 50 MG 24 hr tablet    Sig: Take 1 tablet (50 mg total) by mouth daily. Take with or immediately following a meal.    Dispense:  30 tablet    Refill:  11    IM program   DULoxetine (CYMBALTA) 30 MG capsule    Sig: Take 1 capsule (30 mg total) by mouth daily.    Dispense:  30 capsule    Refill:  2    IM program     Follow up: 3 months  We look forward to seeing you next time. Please call our clinic at [phone] if you have any questions or concerns. The best time to call is Monday-Friday from 9am-4pm, but there is someone available 24/7. If after hours or the weekend, call the main hospital number and ask for the Internal Medicine Resident On-Call. If you need medication refills, please notify your pharmacy one week in advance and they will send [place] a request.   Thank  you for trusting me with your care. Wishing you the best!   Rudene Christians, DO [facility]

## 2022-09-24 DIAGNOSIS — G8929 Other chronic pain: Secondary | ICD-10-CM

## 2022-09-24 HISTORY — DX: Other chronic pain: G89.29

## 2022-09-24 NOTE — Assessment & Plan Note (Addendum)
Will plan for further work-up of this at follow-up with sleep study.

## 2022-09-24 NOTE — Assessment & Plan Note (Signed)
Patient with history of back pain since 2004. He was evaluated by MRI at that time at it showed small L3-L4 disc protrusion. Lumbar Xray from 2021 showed mild disc degeneration with chronic Left SI joint degeneration. He denies weight loss, bowel or bladder incontinence, and no saddle anesthesia. Denies numbness and tingling to lower extremities.  Physical exam with no vertebral tenderness, straight leg raise test negative. A/P: He is not interested in trying duloxetine at this time as his goal is to be on the least amount of medications possible. Could consider referral to PT after January 1st as he will have insurance coverage at that point. He is interested in PT.

## 2022-09-24 NOTE — Assessment & Plan Note (Signed)
Since increasing metoprolol dose, he has had fewer episodes of palpitations. This happens a random times but he has noticed more episodes after eating. He prefers to be on minimal medications and does not find current frequency of episodes bothersome enough to want to increase metoprolol dosing. A/P: Continue metoprolol 25 mg

## 2022-09-24 NOTE — Progress Notes (Signed)
Subjective:  CC: palpitations  HPI:  [name] is a 51 y.o. male with a past medical history stated below and presents today for follow-up on palpitations. Since increasing beta blocker dose, he has had fewer episodes.He continues to have 2-3 episodes weekly. The other day this feeling happened for several hours and he found this bothersome. He was able to follow-up with Dr. Kipp Browning with cardiothoracic surgery for thoracic aneyrsm and he has repeat imaging scheduled at the end of January. Please see problem based assessment and plan for additional details.  Past Medical History:  Diagnosis Date   Aortic aneurysm without rupture (HCC)    Chest pain of uncertain etiology [id number]   CKD (chronic kidney disease)    GERD (gastroesophageal reflux disease)    Heart failure (HCC)    Obesity (BMI 30-39.9) 08/11/2021    Current Outpatient Medications on File Prior to Visit  Medication Sig Dispense Refill   sildenafil (VIAGRA) 25 MG tablet Take 100 mg by mouth daily as needed for erectile dysfunction.     No current facility-administered medications on file prior to visit.    Family History  Problem Relation Age of Onset   Cancer Sister    Hearing loss Maternal Grandmother    Cancer Maternal Grandfather     Social History   Socioeconomic History   Marital status: Single    Spouse name: n/a   Number of children: 3   Years of education: Not on file   Highest education level: Not on file  Occupational History   Occupation: painter    Comment: unemployed  Tobacco Use   Smoking status: Never   Smokeless tobacco: Never  Substance and Sexual Activity   Alcohol use: Yes    Alcohol/week: 0.0 standard drinks of alcohol    Comment: weekends, 1 pint   Drug use: No   Sexual activity: Not on file  Other Topics Concern   Not on file  Social History Narrative   Not on file   Social Determinants of Health   Financial Resource Strain: Not on file  Food Insecurity: No  Food Insecurity (09/23/2022)   Hunger Vital Sign    Worried About Running Out of Food in the Last Year: Never true    Ran Out of Food in the Last Year: Never true  Recent Concern: Food Insecurity - Food Insecurity Present (07/15/2022)   Hunger Vital Sign    Worried About Running Out of Food in the Last Year: Sometimes true    Ran Out of Food in the Last Year: Sometimes true  Transportation Needs: No Transportation Needs (09/23/2022)   PRAPARE - Hydrologist (Medical): No    Lack of Transportation (Non-Medical): No  Physical Activity: Not on file  Stress: Not on file  Social Connections: Socially Isolated (09/23/2022)   Social Connection and Isolation Panel [NHANES]    Frequency of Communication with Friends and Family: More than three times a week    Frequency of Social Gatherings with Friends and Family: More than three times a week    Attends Religious Services: Never    Marine scientist or Organizations: No    Attends Archivist Meetings: Never    Marital Status: Never married  Intimate Partner Violence: Not At Risk (09/23/2022)   Humiliation, Afraid, Rape, and Kick questionnaire    Fear of Current or Ex-Partner: No    Emotionally Abused: No    Physically Abused: No  Sexually Abused: No    Review of Systems: ROS negative except for what is noted on the assessment and plan.  Objective:   Vitals:   09/23/22 1022  BP: (!) 127/90  Pulse: 83  Temp: 98.4 F (36.9 C)  TempSrc: Oral  SpO2: 100%  Weight: 237 lb 11.2 oz (107.8 kg)  Height: 6' (1.829 m)    Physical Exam: Constitutional: well-appearing Cardiovascular: regular rate and rhythm, no m/r/g Pulmonary/Chest: normal work of breathing on room air, lungs clear to auscultation bilaterally Abdominal: soft, non-tender, non-distended MSK: no vertebral tenderness, no muscle weakness in flexion/ extension of hip or knee bilaterally, negative straight leg raise  bilaterally Neurological: alert & oriented x 3 Skin: warm and dry   Assessment & Plan:  Palpitations Since increasing metoprolol dose, he has had fewer episodes of palpitations. This happens a random times but he has noticed more episodes after eating. He prefers to be on minimal medications and does not find current frequency of episodes bothersome enough to want to increase metoprolol dosing. A/P: Continue metoprolol 25 mg  Fatigue Will plan for further work-up of this at follow-up with sleep study.  Chronic back pain Patient with history of back pain since 2004. He was evaluated by MRI at that time at it showed small L3-L4 disc protrusion. Lumbar Xray from 2021 showed mild disc degeneration with chronic Left SI joint degeneration. He denies weight loss, bowel or bladder incontinence, and no saddle anesthesia. Denies numbness and tingling to lower extremities.  Physical exam with no vertebral tenderness, straight leg raise test negative. A/P: He is not interested in trying duloxetine at this time as his goal is to be on the least amount of medications possible. Could consider referral to PT after January 1st as he will have insurance coverage at that point. He is interested in PT.   Patient discussed with Dr. Josetta Browning Timothy Browning, D.O. [facility]  PGY-2 Pager: [phone]  Phone: [phone] Date 09/24/2022  Time 1:34 PM

## 2022-09-25 NOTE — Progress Notes (Signed)
Internal Medicine Clinic Attending  Case discussed with Dr. Masters  At the time of the visit.  We reviewed the resident's history and exam and pertinent patient test results.  I agree with the assessment, diagnosis, and plan of care documented in the resident's note.  

## 2022-10-05 ENCOUNTER — Other Ambulatory Visit (HOSPITAL_COMMUNITY): Payer: Self-pay

## 2022-10-13 ENCOUNTER — Other Ambulatory Visit (HOSPITAL_COMMUNITY): Payer: Self-pay

## 2022-10-15 ENCOUNTER — Telehealth: Payer: Self-pay | Admitting: *Deleted

## 2022-10-15 ENCOUNTER — Other Ambulatory Visit (HOSPITAL_COMMUNITY): Payer: Self-pay

## 2022-10-15 MED ORDER — PAXLOVID (300/100) 20 X 150 MG & 10 X 100MG PO TBPK
ORAL_TABLET | ORAL | 0 refills | Status: DC
  Filled 2022-10-15 (×2): qty 30, 5d supply, fill #0

## 2022-10-15 NOTE — Telephone Encounter (Signed)
Called patient confirmed symptom onset date on 12/24.  Patinet does have comorib conditions, not vaccinated for COVID, has had covid once in 2020.  Discussed paxlovid. Compatable with medications/ renal function. He is interested however may be cost prohabitive, gave him number for [employer] patient support and sent Rx to [facility].

## 2022-10-15 NOTE — Telephone Encounter (Signed)
What did he test postive for, if [facility] shouldn[facility] we have the result in our EHR?

## 2022-10-15 NOTE — Telephone Encounter (Signed)
Call from patient has been having Chest Congestion. Coughing up yellowish white, Nasal Congestion sluggish.  Test positive on 10/13/2022.  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  Started felling back 10/11/2022. Chills no fevers 1st 2 days.  Headaches has subsided..   Tylenol helped with this.  Dizziness with other problems got worse now is better.

## 2022-10-15 NOTE — Telephone Encounter (Signed)
Patient did an in home test for the Covid.

## 2022-10-16 ENCOUNTER — Other Ambulatory Visit (HOSPITAL_COMMUNITY): Payer: Self-pay

## 2022-10-19 DIAGNOSIS — Z419 Encounter for procedure for purposes other than remedying health state, unspecified: Secondary | ICD-10-CM | POA: Diagnosis not present

## 2022-10-22 ENCOUNTER — Other Ambulatory Visit: Payer: Self-pay

## 2022-10-22 DIAGNOSIS — Z01812 Encounter for preprocedural laboratory examination: Secondary | ICD-10-CM

## 2022-10-23 LAB — BASIC METABOLIC PANEL
BUN/Creatinine Ratio: 12 (ref 9–20)
BUN: 16 mg/dL (ref 6–24)
CO2: 20 mmol/L (ref 20–29)
Calcium: 9.1 mg/dL (ref 8.7–10.2)
Chloride: 103 mmol/L (ref 96–106)
Creatinine, Ser: 1.31 mg/dL — ABNORMAL HIGH (ref 0.76–1.27)
Glucose: 88 mg/dL (ref 70–99)
Potassium: 4.2 mmol/L (ref 3.5–5.2)
Sodium: 137 mmol/L (ref 134–144)
eGFR: 66 mL/min/{1.73_m2} (ref >59)

## 2022-11-02 ENCOUNTER — Other Ambulatory Visit (HOSPITAL_COMMUNITY): Payer: Self-pay

## 2022-11-03 ENCOUNTER — Other Ambulatory Visit (HOSPITAL_COMMUNITY): Payer: Self-pay

## 2022-11-12 ENCOUNTER — Ambulatory Visit (HOSPITAL_COMMUNITY)
Admission: RE | Admit: 2022-11-12 | Discharge: 2022-11-12 | Disposition: A | Discharge status: Home / Self Care | Payer: Medicaid Other | Source: Ambulatory Visit | Attending: Cardiology | Admitting: Cardiology

## 2022-11-12 DIAGNOSIS — I7121 Aneurysm of the ascending aorta, without rupture: Secondary | ICD-10-CM | POA: Diagnosis not present

## 2022-11-12 DIAGNOSIS — I712 Thoracic aortic aneurysm, without rupture, unspecified: Secondary | ICD-10-CM | POA: Diagnosis not present

## 2022-11-12 MED ORDER — IOHEXOL 350 MG/ML SOLN
100.0000 mL | Freq: Once | INTRAVENOUS | Status: AC | PRN
  Administered 2022-11-12: 100 mL via INTRAVENOUS

## 2022-11-17 ENCOUNTER — Ambulatory Visit: Payer: Medicaid Other | Attending: Cardiology

## 2022-11-17 ENCOUNTER — Ambulatory Visit: Payer: Medicaid Other | Attending: Cardiology | Admitting: Cardiology

## 2022-11-17 ENCOUNTER — Ambulatory Visit: Payer: Medicaid Other | Admitting: Cardiology

## 2022-11-17 ENCOUNTER — Encounter: Payer: Self-pay | Admitting: Cardiology

## 2022-11-17 VITALS — BP 120/83 | HR 75 | Ht 72.0 in | Wt 211.5 lb

## 2022-11-17 DIAGNOSIS — I7121 Aneurysm of the ascending aorta, without rupture: Secondary | ICD-10-CM | POA: Diagnosis not present

## 2022-11-17 DIAGNOSIS — R400 Somnolence: Secondary | ICD-10-CM

## 2022-11-17 DIAGNOSIS — Z79899 Other long term (current) drug therapy: Secondary | ICD-10-CM

## 2022-11-17 DIAGNOSIS — R002 Palpitations: Secondary | ICD-10-CM

## 2022-11-17 DIAGNOSIS — R5383 Other fatigue: Secondary | ICD-10-CM | POA: Diagnosis not present

## 2022-11-17 DIAGNOSIS — R42 Dizziness and giddiness: Secondary | ICD-10-CM

## 2022-11-17 NOTE — Patient Instructions (Addendum)
Medication Instructions:  Your physician recommends that you continue on your current medications as directed. Please refer to the Current Medication list given to you today.  *If you need a refill on your cardiac medications before your next appointment, please call your pharmacy*   Lab Work: Your physician recommends that you have labs drawn. BMET, Mag, CBC, Vit D, Testosterone  If you have labs (blood work) drawn today and your tests are completely normal, you will receive your results only by: [place] (if you have MyChart) OR A paper copy in the mail If you have any lab test that is abnormal or we need to change your treatment, we will call you to review the results.   Testing/Procedures: Your physician has recommended that you have a sleep study. This test records several body functions during sleep, including: brain activity, eye movement, oxygen and carbon dioxide blood levels, heart rate and rhythm, breathing rate and rhythm, the flow of air through your mouth and nose, snoring, body muscle movements, and chest and belly movement.  ZIO XT- Long Term Monitor Instructions  Your physician has requested you wear a ZIO patch monitor for 14 days.  This is a single patch monitor. [employer] supplies one patch monitor per enrollment. Additional stickers are not available. Please do not apply patch if you will be having a Nuclear Stress Test,  Echocardiogram, Cardiac CT, MRI, or Chest Xray during the period you would be wearing the  monitor. The patch cannot be worn during these tests. You cannot remove and re-apply the  ZIO XT patch monitor.  Your ZIO patch monitor will be mailed 3 day USPS to your address on file. It may take 3-5 days  to receive your monitor after you have been enrolled.  Once you have received your monitor, please review the enclosed instructions. Your monitor  has already been registered assigning a specific monitor serial # to you.  Billing and Patient  Assistance Program Information  We have supplied [employer] with any of your insurance information on file for billing purposes. [employer] offers a sliding scale Patient Assistance Program for patients that do not have  insurance, or whose insurance does not completely cover the cost of the ZIO monitor.  You must apply for the Patient Assistance Program to qualify for this discounted rate.  To apply, please call [employer] at [phone], select option 4, select option 2, ask to apply for  Patient Assistance Program. Timothy Browning will ask your household income, and how many people  are in your household. They will quote your out-of-pocket cost based on that information.  [employer] will also be able to set up a 60-month, interest-free payment plan if needed.  Applying the monitor   Shave hair from upper left chest.  Hold abrader disc by orange tab. Rub abrader in 40 strokes over the upper left chest as  indicated in your monitor instructions.  Clean area with 4 enclosed alcohol pads. Let dry.  Apply patch as indicated in monitor instructions. Patch will be placed under collarbone on left  side of chest with arrow pointing upward.  Rub patch adhesive wings for 2 minutes. Remove white label marked "1". Remove the white  label marked "2". Rub patch adhesive wings for 2 additional minutes.  While looking in a mirror, press and release button in center of patch. A small green light will  flash 3-4 times. This will be your only indicator that the monitor has been turned on.  Do not shower for the first  24 hours. You may shower after the first 24 hours.  Press the button if you feel a symptom. You will hear a small click. Record Date, Time and  Symptom in the Patient Logbook.  When you are ready to remove the patch, follow instructions on the last 2 pages of Patient  Logbook. Stick patch monitor onto the last page of Patient Logbook.  Place Patient Logbook in the blue and white box. Use locking tab on box and  tape box closed  securely. The blue and white box has prepaid postage on it. Please place it in the mailbox as  soon as possible. Your physician should have your test results approximately 7 days after the  monitor has been mailed back to [facility].  Call Timothy Browning at [phone] if you have questions regarding  your ZIO XT patch monitor. Call them immediately if you see an orange light blinking on your  monitor.  If your monitor falls off in less than 4 days, contact our Monitor department at [phone].  If your monitor becomes loose or falls off after 4 days call [employer] at [phone] for  suggestions on securing your monitor    Follow-Up: At [facility], you and your health needs are our priority.  As part of our continuing mission to provide you with exceptional heart care, we have created designated Provider Care Teams.  These Care Teams include your primary Cardiologist (physician) and Advanced Practice Providers (APPs -  Physician Assistants and Nurse Practitioners) who all work together to provide you with the care you need, when you need it.  We recommend signing up for the patient portal called "MyChart".  Sign up information is provided on this After Visit Summary.  MyChart is used to connect with patients for Virtual Visits (Telemedicine).  Patients are able to view lab/test results, encounter notes, upcoming appointments, etc.  Non-urgent messages can be sent to your provider as well.   To learn more about what you can do with MyChart, go to [email or web address].    Your next appointment:   4 month(s)  Provider:   Berniece Salines, DO     Other Instructions
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
## 2022-11-17 NOTE — Progress Notes (Unsigned)
Enrolled for [employer] to mail a ZIO XT long term holter monitor to the patients address on file.  

## 2022-11-18 LAB — BASIC METABOLIC PANEL
BUN/Creatinine Ratio: 15 (ref 9–20)
BUN: 18 mg/dL (ref 6–24)
CO2: 20 mmol/L (ref 20–29)
Calcium: 9.2 mg/dL (ref 8.7–10.2)
Chloride: 104 mmol/L (ref 96–106)
Creatinine, Ser: 1.18 mg/dL (ref 0.76–1.27)
Glucose: 92 mg/dL (ref 70–99)
Potassium: 4.9 mmol/L (ref 3.5–5.2)
Sodium: 139 mmol/L (ref 134–144)
eGFR: 75 mL/min/{1.73_m2} (ref >59)

## 2022-11-18 LAB — CBC WITH DIFFERENTIAL/PLATELET
Basophils Absolute: 0 10*3/uL (ref 0.0–0.2)
Basos: 1 %
EOS (ABSOLUTE): 0.1 10*3/uL (ref 0.0–0.4)
Eos: 1 %
Hematocrit: 44.7 % (ref 37.5–51.0)
Hemoglobin: 14.9 g/dL (ref 13.0–17.7)
Immature Grans (Abs): 0 10*3/uL (ref 0.0–0.1)
Immature Granulocytes: 0 %
Lymphocytes Absolute: 1.2 10*3/uL (ref 0.7–3.1)
Lymphs: 26 %
MCH: 30.5 pg (ref 26.6–33.0)
MCHC: 33.3 g/dL (ref 31.5–35.7)
MCV: 92 fL (ref 79–97)
Monocytes Absolute: 0.6 10*3/uL (ref 0.1–0.9)
Monocytes: 13 %
Neutrophils Absolute: 2.7 10*3/uL (ref 1.4–7.0)
Neutrophils: 59 %
Platelets: 324 10*3/uL (ref 150–450)
RBC: 4.88 x10E6/uL (ref 4.14–5.80)
RDW: 12.8 % (ref 11.6–15.4)
WBC: 4.5 10*3/uL (ref 3.4–10.8)

## 2022-11-18 LAB — TESTOSTERONE: Testosterone: 362 ng/dL (ref 264–916)

## 2022-11-18 LAB — MAGNESIUM: Magnesium: 2 mg/dL (ref 1.6–2.3)

## 2022-11-18 LAB — VITAMIN D 25 HYDROXY (VIT D DEFICIENCY, FRACTURES): Vit D, 25-Hydroxy: 16.4 ng/mL — ABNORMAL LOW (ref 30.0–100.0)

## 2022-11-19 ENCOUNTER — Other Ambulatory Visit (HOSPITAL_COMMUNITY): Payer: Self-pay

## 2022-11-19 ENCOUNTER — Other Ambulatory Visit: Payer: Self-pay

## 2022-11-19 DIAGNOSIS — Z419 Encounter for procedure for purposes other than remedying health state, unspecified: Secondary | ICD-10-CM | POA: Diagnosis not present

## 2022-11-19 MED ORDER — VITAMIN D (ERGOCALCIFEROL) 1.25 MG (50000 UNIT) PO CAPS
50000.0000 [IU] | ORAL_CAPSULE | ORAL | 0 refills | Status: DC
  Filled 2022-11-19 – 2022-12-02 (×2): qty 12, 84d supply, fill #0

## 2022-11-19 NOTE — Progress Notes (Signed)
Prescription sent to pharmacy.

## 2022-11-23 DIAGNOSIS — R42 Dizziness and giddiness: Secondary | ICD-10-CM

## 2022-11-23 DIAGNOSIS — R002 Palpitations: Secondary | ICD-10-CM | POA: Diagnosis not present

## 2022-12-01 ENCOUNTER — Other Ambulatory Visit (HOSPITAL_COMMUNITY): Payer: Self-pay

## 2022-12-02 ENCOUNTER — Other Ambulatory Visit (HOSPITAL_COMMUNITY): Payer: Self-pay

## 2022-12-06 ENCOUNTER — Ambulatory Visit (HOSPITAL_BASED_OUTPATIENT_CLINIC_OR_DEPARTMENT_OTHER): Payer: Medicaid Other | Attending: Cardiology | Admitting: Cardiovascular Disease

## 2022-12-06 VITALS — Ht 72.0 in | Wt 237.0 lb

## 2022-12-06 DIAGNOSIS — R400 Somnolence: Secondary | ICD-10-CM | POA: Diagnosis not present

## 2022-12-06 DIAGNOSIS — G4733 Obstructive sleep apnea (adult) (pediatric): Secondary | ICD-10-CM | POA: Insufficient documentation

## 2022-12-06 DIAGNOSIS — R5383 Other fatigue: Secondary | ICD-10-CM | POA: Diagnosis present

## 2022-12-06 DIAGNOSIS — G4736 Sleep related hypoventilation in conditions classified elsewhere: Secondary | ICD-10-CM | POA: Diagnosis not present

## 2022-12-15 ENCOUNTER — Encounter (HOSPITAL_BASED_OUTPATIENT_CLINIC_OR_DEPARTMENT_OTHER): Payer: Self-pay | Admitting: Cardiovascular Disease

## 2022-12-15 NOTE — Procedures (Signed)
Patient Name: Timothy Browning, Timothy Browning Date: 12/06/2022 Gender: Male D.O.B: 1971/06/12 Age (years): 29 Referring Provider: Godfrey Pick Tobb DO Height (inches): 72 Interpreting Physician: Shelva Majestic MD, ABSM Weight (lbs): 237 RPSGT: Gwenyth Allegra BMI: 32 MRN: [id number] Neck Size: 17.00  CLINICAL INFORMATION Sleep Study Type: NPSG  Indication for sleep study: snoring, daytime sleepiness, fatigue  Epworth Sleepiness Score: 9  SLEEP STUDY TECHNIQUE As per the AASM Manual for the Scoring of Sleep and Associated Events v2.3 (April 2016) with a hypopnea requiring 4% desaturations.  The channels recorded and monitored were frontal, central and occipital EEG, electrooculogram (EOG), submentalis EMG (chin), nasal and oral airflow, thoracic and abdominal wall motion, anterior tibialis EMG, snore microphone, electrocardiogram, and pulse oximetry.  MEDICATIONS losartan (COZAAR) 25 MG tablet metoprolol succinate (TOPROL XL) 25 MG 24 hr tablet rosuvastatin (CRESTOR) 5 MG tablet sildenafil (VIAGRA) 25 MG tablet Vitamin D, Ergocalciferol, (DRISDOL) 1.25 MG (50000 UNIT) CAPS capsule Medications self-administered by patient taken the night of the study : N/A  SLEEP ARCHITECTURE The study was initiated at 10:04:45 PM and ended at 4:26:42 AM.  Sleep onset time was 158.2 minutes and the sleep efficiency was 48.8%. The total sleep time was 186.5 minutes.  Stage REM latency was 37.5 minutes.  The patient spent 5.6% of the night in stage N1 sleep, 76.9% in stage N2 sleep, 0.0% in stage N3 and 17.4% in REM.  Alpha intrusion was absent.  Supine sleep was 30.29%.  RESPIRATORY PARAMETERS The overall apnea/hypopnea index (AHI) was 9.3 per hour. The respiratory disturbanc eindex (RDI) was 16.4/h. There were 0 total apneas, including 0 obstructive, 0 central and 0 mixed apneas. There were 29 hypopneas and 22 RERAs.  The AHI during Stage REM sleep was 42.5 per hour.  AHI while supine was  21.2 per hour.  The mean oxygen saturation was 91.3%. The minimum SpO2 during sleep was 81.0%.  Moderate snoring was noted during this study.  CARDIAC DATA The 2 lead EKG demonstrated sinus rhythm. The mean heart rate was 77.3 beats per minute. Other EKG findings include: None.  LEG MOVEMENT DATA The total PLMS were 0 with a resulting PLMS index of 0.0. Associated arousal with leg movement index was 0.0 .  IMPRESSIONS - Mild obstructive sleep apnea overall (AHI 9.3/h; RDI 16.4/h); however, sleep apnea was moderate with supine sleep (AHI 21.2/h) and severe during REM sleep (AHI 42.5/h). - Moderate oxygen desaturation to a nadir of 81.0%. - The patient snored with moderate snoring volume. - No cardiac abnormalities were noted during this study. - Clinically significant periodic limb movements did not occur during sleep. No significant associated arousals.  DIAGNOSIS - Obstructive Sleep Apnea (G47.33) - Nocturnal Hypoxemia (G47.36)  RECOMMENDATIONS - Therapeutic CPAP titration to determine optimal pressure required to alleviate sleep disordered breathing. If unable to obtain an in-lab CPAP titration, initiate Auto-PAP with EPT of 3 at 6 - 18 cm of water. - Effort should be made to optimize nasal and oropharyngeal patency. - Positional therapy avoiding supine position during sleep. - Avoid alcohol, sedatives and other CNS depressants that may worsen sleep apnea and disrupt normal sleep architecture. - Sleep hygiene should be reviewed to assess factors that may improve sleep quality. - Weight management (BMI 32) and regular exercise should be initiated or continued if appropriate.  [Electronically signed] 12/15/2022 12:41 PM  Shelva Majestic MD, [facility], [place], American Board of Sleep Medicine  NPI: [id number]  [place] PH: [phone]   FX: [phone]  [place] OF SLEEP MEDICINE

## 2022-12-18 DIAGNOSIS — Z419 Encounter for procedure for purposes other than remedying health state, unspecified: Secondary | ICD-10-CM | POA: Diagnosis not present

## 2022-12-22 ENCOUNTER — Other Ambulatory Visit: Payer: Self-pay | Admitting: Cardiovascular Disease

## 2022-12-22 ENCOUNTER — Telehealth: Payer: Self-pay | Admitting: *Deleted

## 2022-12-22 DIAGNOSIS — G4736 Sleep related hypoventilation in conditions classified elsewhere: Secondary | ICD-10-CM

## 2022-12-22 DIAGNOSIS — G4733 Obstructive sleep apnea (adult) (pediatric): Secondary | ICD-10-CM

## 2022-12-22 NOTE — Telephone Encounter (Signed)
Prior Authorization for CPAP titration sent to [facility] Medicaid [facility]) via Phone. Reference # [id number].  Approval # [id number]. Valid dates 12/22/22 to 03/22/23.

## 2022-12-23 ENCOUNTER — Other Ambulatory Visit (HOSPITAL_COMMUNITY): Payer: Self-pay

## 2022-12-23 ENCOUNTER — Ambulatory Visit: Payer: Medicaid Other | Admitting: Student

## 2022-12-23 VITALS — BP 129/85 | HR 92 | Temp 98.4°F | Ht 72.0 in | Wt 244.5 lb

## 2022-12-23 DIAGNOSIS — M238X1 Other internal derangements of right knee: Secondary | ICD-10-CM

## 2022-12-23 DIAGNOSIS — M238X2 Other internal derangements of left knee: Secondary | ICD-10-CM

## 2022-12-23 DIAGNOSIS — M25562 Pain in left knee: Secondary | ICD-10-CM

## 2022-12-23 DIAGNOSIS — M25561 Pain in right knee: Secondary | ICD-10-CM | POA: Diagnosis not present

## 2022-12-23 MED ORDER — DICLOFENAC SODIUM 1 % EX GEL
2.0000 g | Freq: Four times a day (QID) | CUTANEOUS | 0 refills | Status: DC
  Filled 2022-12-23: qty 100, 13d supply, fill #0
  Filled 2023-01-01: qty 100, 12d supply, fill #0

## 2022-12-23 NOTE — Patient Instructions (Signed)
Mr.Kirubel L Hoving, it was a pleasure seeing you today!  Today we discussed: - We are going to get MRI's done for your knee pain.  I have ordered the following medication/changed the following medications:   Start the following medications: Meds ordered this encounter  Medications   diclofenac Sodium (VOLTAREN) 1 % GEL    Sig: Apply 2 g topically 4 (four) times daily.    Dispense:  100 g    Refill:  0     Follow-up: 3 months   Please make sure to arrive 15 minutes prior to your next appointment. If you arrive late, you may be asked to reschedule.   We look forward to seeing you next time. Please call our clinic at [phone] if you have any questions or concerns. The best time to call is Monday-Friday from 9am-4pm, but there is someone available 24/7. If after hours or the weekend, call the main hospital number and ask for the Internal Medicine Resident On-Call. If you need medication refills, please notify your pharmacy one week in advance and they will send [place] a request.  Thank you for letting us take part in your care. Wishing you the best!  Thank you, Sanjuan Dame, MD

## 2022-12-24 ENCOUNTER — Other Ambulatory Visit: Payer: Self-pay | Admitting: Urology

## 2022-12-24 DIAGNOSIS — R972 Elevated prostate specific antigen [PSA]: Secondary | ICD-10-CM

## 2022-12-25 DIAGNOSIS — S83512A Sprain of anterior cruciate ligament of left knee, initial encounter: Secondary | ICD-10-CM

## 2022-12-25 DIAGNOSIS — S83511A Sprain of anterior cruciate ligament of right knee, initial encounter: Secondary | ICD-10-CM | POA: Insufficient documentation

## 2022-12-25 DIAGNOSIS — M25561 Pain in right knee: Secondary | ICD-10-CM | POA: Insufficient documentation

## 2022-12-25 HISTORY — DX: Sprain of anterior cruciate ligament of right knee, initial encounter: S83.512A
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
## 2022-12-29 NOTE — Progress Notes (Signed)
Internal Medicine Clinic Attending  Case discussed with the resident at the time of the visit.  We reviewed the resident's history and exam and pertinent patient test results.  I agree with the assessment, diagnosis, and plan of care documented in the resident's note.  

## 2022-12-31 ENCOUNTER — Other Ambulatory Visit (HOSPITAL_COMMUNITY): Payer: Self-pay

## 2023-01-01 ENCOUNTER — Other Ambulatory Visit (HOSPITAL_COMMUNITY): Payer: Self-pay

## 2023-01-04 ENCOUNTER — Ambulatory Visit: Payer: Medicaid Other | Attending: Cardiology | Admitting: Cardiology

## 2023-01-04 ENCOUNTER — Encounter: Payer: Self-pay | Admitting: Cardiology

## 2023-01-04 VITALS — BP 127/89 | HR 82 | Ht 72.0 in

## 2023-01-04 DIAGNOSIS — N1831 Chronic kidney disease, stage 3a: Secondary | ICD-10-CM | POA: Diagnosis not present

## 2023-01-04 DIAGNOSIS — I5022 Chronic systolic (congestive) heart failure: Secondary | ICD-10-CM

## 2023-01-04 DIAGNOSIS — I493 Ventricular premature depolarization: Secondary | ICD-10-CM

## 2023-01-04 DIAGNOSIS — I491 Atrial premature depolarization: Secondary | ICD-10-CM | POA: Diagnosis not present

## 2023-01-04 DIAGNOSIS — I7121 Aneurysm of the ascending aorta, without rupture: Secondary | ICD-10-CM | POA: Diagnosis not present
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
## 2023-01-04 NOTE — Patient Instructions (Signed)
Medication Instructions:  Your physician recommends that you continue on your current medications as directed. Please refer to the Current Medication list given to you today.  *If you need a refill on your cardiac medications before your next appointment, please call your pharmacy*   Lab Work: None   Testing/Procedures: None   Follow-Up: At [facility], you and your health needs are our priority.  As part of our continuing mission to provide you with exceptional heart care, we have created designated Provider Care Teams.  These Care Teams include your primary Cardiologist (physician) and Advanced Practice Providers (APPs -  Physician Assistants and Nurse Practitioners) who all work together to provide you with the care you need, when you need it.   Your next appointment:   9 month(s)  Provider:   Kardie Tobb, DO   

## 2023-01-06 ENCOUNTER — Encounter (HOSPITAL_BASED_OUTPATIENT_CLINIC_OR_DEPARTMENT_OTHER): Payer: Medicaid Other | Admitting: Cardiovascular Disease

## 2023-01-18 DIAGNOSIS — Z419 Encounter for procedure for purposes other than remedying health state, unspecified: Secondary | ICD-10-CM | POA: Diagnosis not present

## 2023-01-25 ENCOUNTER — Other Ambulatory Visit: Payer: Medicaid Other

## 2023-01-27 ENCOUNTER — Ambulatory Visit (HOSPITAL_BASED_OUTPATIENT_CLINIC_OR_DEPARTMENT_OTHER): Payer: Medicaid Other | Attending: Cardiovascular Disease | Admitting: Cardiovascular Disease

## 2023-01-27 VITALS — Ht 72.0 in | Wt 244.0 lb

## 2023-01-27 DIAGNOSIS — G4733 Obstructive sleep apnea (adult) (pediatric): Secondary | ICD-10-CM | POA: Diagnosis not present

## 2023-01-27 DIAGNOSIS — G4736 Sleep related hypoventilation in conditions classified elsewhere: Secondary | ICD-10-CM | POA: Diagnosis not present

## 2023-01-28 ENCOUNTER — Other Ambulatory Visit: Payer: Self-pay | Admitting: Thoracic Surgery (Cardiothoracic Vascular Surgery)

## 2023-01-28 DIAGNOSIS — I7121 Aneurysm of the ascending aorta, without rupture: Secondary | ICD-10-CM

## 2023-02-01 ENCOUNTER — Other Ambulatory Visit (HOSPITAL_COMMUNITY): Payer: Self-pay

## 2023-02-14 ENCOUNTER — Encounter (HOSPITAL_BASED_OUTPATIENT_CLINIC_OR_DEPARTMENT_OTHER): Payer: Self-pay | Admitting: Cardiovascular Disease

## 2023-02-14 NOTE — Procedures (Signed)
Patient Name: Timothy, Browning Date: 01/27/2023 Gender: Male D.O.B: March 28, 1971 Age (years): 51 Referring Provider: Lavona Mound Tobb DO Height (inches): 72 Interpreting Physician: Nicki Guadalajara MD, ABSM Weight (lbs): 244 RPSGT: Ulyess Mort BMI: 33 MRN: [id number] Neck Size: 15.50  CLINICAL INFORMATION The patient is referred for a CPAP titration to treat sleep apnea.  Date of NPSG: 12/06/2022: AHI 9.3/h; RDI 16.4/h; REM AHI 42.5/h; O2 nadir 81%  SLEEP STUDY TECHNIQUE As per the AASM Manual for the Scoring of Sleep and Associated Events v2.3 (April 2016) with a hypopnea requiring 4% desaturations.  The channels recorded and monitored were frontal, central and occipital EEG, electrooculogram (EOG), submentalis EMG (chin), nasal and oral airflow, thoracic and abdominal wall motion, anterior tibialis EMG, snore microphone, electrocardiogram, and pulse oximetry. Continuous positive airway pressure (CPAP) was initiated at the beginning of the study and titrated to treat sleep-disordered breathing.  MEDICATIONS iclofenac Sodium (VOLTAREN) 1 % GEL losartan (COZAAR) 25 MG tablet metoprolol succinate (TOPROL XL) 25 MG 24 hr tablet rosuvastatin (CRESTOR) 5 MG tablet sildenafil (VIAGRA) 25 MG tablet Vitamin D, Ergocalciferol, (DRISDOL) 1.25 MG (50000 UNIT) CAPS capsule Medications self-administered by patient taken the night of the study : N/A  TECHNICIAN COMMENTS Comments added by technician: PATIENT WAS ORDERED AS A CPAP TITRATION. Comments added by scorer: N/A  RESPIRATORY PARAMETERS Optimal PAP Pressure (cm): 14 AHI at Optimal Pressure (/hr): 1 Overall Minimal O2 (%): 85.0 Supine % at Optimal Pressure (%): 82 Minimal O2 at Optimal Pressure (%): 93.0   SLEEP ARCHITECTURE The study was initiated at 11:05:21 PM and ended at 5:04:39 AM.  Sleep onset time was 27.9 minutes and the sleep efficiency was 78.9%. The total sleep time was 283.5 minutes.  The patient spent 5.6%  of the night in stage N1 sleep, 66.0% in stage N2 sleep, 0.5% in stage N3 and 27.9% in REM.Stage REM latency was 55.0 minutes  Wake after sleep onset was 47.9. Alpha intrusion was absent. Supine sleep was 62.61%.  CARDIAC DATA The 2 lead EKG demonstrated sinus rhythm. The mean heart rate was 72.8 beats per minute. Other EKG findings include: None.  LEG MOVEMENT DATA The total Periodic Limb Movements of Sleep (PLMS) were 0. The PLMS index was 0.0. A PLMS index of <15 is considered normal in adults.  IMPRESSIONS - CPAP was initiated at 6 cm and was titrated to optimal pressure at 14 cm H2O ( AHI 1.0/h; RDI 1.0/h; O2 nadir of 93%). - Moderate oxygen desaturations were observed during this titration to a nadir of 85% at 12 cm. - The patient snored with moderate snoring volume during this titration study. Snoring resolved at 14 cm of water. - No significant cardiac abnormalities were observed during this study: rare isolated PAC. - Clinically significant periodic limb movements were not noted during this study. Arousals associated with PLMs were rare.  DIAGNOSIS - Obstructive Sleep Apnea (G47.33)  RECOMMENDATIONS - Recommend an initial trial of CPAP Auto therapy with EPR of 3 at 14 - 17 cm of water with heated humidification. A Medium size Fisher&Paykel Full Face Simplus mask was used for the titration. - Effort should be made to optimize nasal and oropharyngeal patrency. - Avoid alcohol, sedatives and other CNS depressants that may worsen sleep apnea and disrupt normal sleep architecture. - Sleep hygiene should be reviewed to assess factors that may improve sleep quality. - Weight management and regular exercise should be initiated or continued. - Recommend a download and sleep clinic evaluation after 4 weeks  of therapy.   [Electronically signed] 02/14/2023 01:13 PM  Nicki Guadalajara MD, [facility], ABSM Diplomate, American Board of Sleep Medicine  NPI: [id number]  [facility] PH: [phone]   FX: [phone] ACCREDITED BY THE AMERICAN ACADEMY OF SLEEP MEDICINE

## 2023-02-17 DIAGNOSIS — Z419 Encounter for procedure for purposes other than remedying health state, unspecified: Secondary | ICD-10-CM | POA: Diagnosis not present

## 2023-02-18 ENCOUNTER — Telehealth: Payer: Self-pay | Admitting: *Deleted

## 2023-02-18 NOTE — Telephone Encounter (Signed)
-----   Message from [name], MD sent at 02/14/2023  1:18 PM EDT ----- Timothy Browning, please notify patient and initiate with DME CPAP therapy

## 2023-02-18 NOTE — Telephone Encounter (Signed)
-----   Message from Thomas A Kelly, MD sent at 02/14/2023  1:18 PM EDT ----- Japhet Morgenthaler, please notify patient and initiate with DME CPAP therapy 

## 2023-02-18 NOTE — Telephone Encounter (Signed)
Left message sleep study has been completed. Order for CPAP has been sent to [facility] via [facility] portal.

## 2023-03-02 ENCOUNTER — Other Ambulatory Visit (HOSPITAL_COMMUNITY): Payer: Self-pay

## 2023-03-02 ENCOUNTER — Other Ambulatory Visit: Payer: Self-pay

## 2023-03-03 ENCOUNTER — Encounter: Payer: Self-pay | Admitting: Thoracic Surgery (Cardiothoracic Vascular Surgery)

## 2023-03-03 ENCOUNTER — Ambulatory Visit (HOSPITAL_COMMUNITY): Payer: Medicaid Other

## 2023-03-12 ENCOUNTER — Ambulatory Visit: Payer: Medicaid Other | Admitting: Thoracic Surgery (Cardiothoracic Vascular Surgery)

## 2023-03-12 ENCOUNTER — Ambulatory Visit
Admission: RE | Admit: 2023-03-12 | Discharge: 2023-03-12 | Disposition: A | Discharge status: Home / Self Care | Payer: Medicaid Other | Source: Ambulatory Visit | Attending: Thoracic Surgery (Cardiothoracic Vascular Surgery) | Admitting: Thoracic Surgery (Cardiothoracic Vascular Surgery)

## 2023-03-12 DIAGNOSIS — I7121 Aneurysm of the ascending aorta, without rupture: Secondary | ICD-10-CM

## 2023-03-12 NOTE — Progress Notes (Unsigned)
[address]       Timothy Browning [address]             [phone]       Patient: Home Provider: Office Consent for Telemedicine visit obtained.  Today's visit was completed via a real-time telehealth (see specific modality noted below). The patient/authorized person provided oral consent at the time of the visit to engage in a telemedicine encounter with the present provider at [facility]. The patient/authorized person was informed of the potential benefits, limitations, and risks of telemedicine. The patient/authorized person expressed understanding that the laws that protect confidentiality also apply to telemedicine. The patient/authorized person acknowledged understanding that telemedicine does not provide emergency services and that he or she would need to call 911 or proceed to the nearest hospital for help if such a need arose.   Total time spent in the clinical discussion 10 minutes.  Telehealth Modality: Phone visit (audio only)  Recent Radiology Findings:   CT CHEST WO CONTRAST  Result Date: 03/12/2023 CLINICAL DATA:  Chest pain with shortness of breath for about 1 year. No history of malignancy or prior relevant surgery. Aortic aneurysm suspected. EXAM: CT CHEST WITHOUT CONTRAST TECHNIQUE: Multidetector CT imaging of the chest was performed following the standard protocol without IV contrast. RADIATION DOSE REDUCTION: This exam was performed according to the departmental dose-optimization program which includes automated exposure control, adjustment of the mA and/or kV according to patient size and/or use of iterative reconstruction technique. COMPARISON:  Chest CTA 11/12/2022 and 07/14/2022. FINDINGS: Cardiovascular: Again demonstrated is dilatation of the aortic root, measuring up to 5.4 cm on coronal image 69/4, similar to the most recent study. This appears mildly increased from the study of 8 months ago at which time it measured 5.2 cm. The thoracic aorta is  otherwise normal in caliber. There is no significant atherosclerosis. Minimal calcification noted at the ductus. There are no aortic valvular calcifications. The heart size is normal. There is no pericardial effusion. Mediastinum/Nodes: There are no enlarged mediastinal, hilar or axillary lymph nodes.Hilar assessment is limited by the lack of intravenous contrast, although the hilar contours appear unchanged. The thyroid gland, trachea and esophagus demonstrate no significant findings. Lungs/Pleura: No pleural effusion or pneumothorax. Linear atelectasis or scarring at both lung bases, minimally increased in the interval on the right. No confluent airspace opacity or suspicious pulmonary nodule. 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 There is regenerated splenic tissue which appears unchanged. No acute findings are identified. The gallbladder is surgically absent. Musculoskeletal/Chest wall: There is no chest wall mass or suspicious osseous finding. 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 IMPRESSION: 1. No acute findings or explanation for the patient's symptoms. 2. Stable dilatation of the aortic root, measuring up to 5.4 cm in diameter. Consider continued imaging surveillance, as clinically warranted. 3. Stable postsurgical changes in the left upper quadrant of the abdomen. Electronically Signed   By: Carey Bullocks M.D.   On: 03/12/2023 09:54    I had a telephone visit with Timothy Browning  Assessment:  52yo male with a 5.4 cm ascending aortic aneurysm.  Echocardiogram shows a tricuspid valve without evidence of regurgitation.  We discussed the natural history and and risk factors for growth of ascending aortic aneurysms.  We covered the importance of smoking cessation, tight blood pressure control, refraining from lifting heavy objects, and avoiding fluoroquinolones.  The patient is aware of signs and symptoms of aortic dissection  and when  to present to the emergency department.  He will come to clinic next week to discuss surgical plans  Corliss Skains

## 2023-03-16 DIAGNOSIS — G4733 Obstructive sleep apnea (adult) (pediatric): Secondary | ICD-10-CM | POA: Diagnosis not present

## 2023-03-18 ENCOUNTER — Encounter: Payer: Self-pay | Admitting: Cardiology

## 2023-03-18 ENCOUNTER — Ambulatory Visit: Payer: Medicaid Other | Attending: Cardiology | Admitting: Cardiology

## 2023-03-18 ENCOUNTER — Other Ambulatory Visit (HOSPITAL_COMMUNITY): Payer: Self-pay

## 2023-03-18 VITALS — BP 122/88 | HR 72 | Ht 72.0 in | Wt 240.6 lb

## 2023-03-18 DIAGNOSIS — Z01818 Encounter for other preprocedural examination: Secondary | ICD-10-CM | POA: Diagnosis not present

## 2023-03-18 DIAGNOSIS — I491 Atrial premature depolarization: Secondary | ICD-10-CM | POA: Diagnosis not present

## 2023-03-18 DIAGNOSIS — N183 Chronic kidney disease, stage 3 unspecified: Secondary | ICD-10-CM

## 2023-03-18 DIAGNOSIS — I712 Thoracic aortic aneurysm, without rupture, unspecified: Secondary | ICD-10-CM

## 2023-03-18 DIAGNOSIS — I493 Ventricular premature depolarization: Secondary | ICD-10-CM

## 2023-03-18 DIAGNOSIS — Z79899 Other long term (current) drug therapy: Secondary | ICD-10-CM

## 2023-03-18 DIAGNOSIS — E782 Mixed hyperlipidemia: Secondary | ICD-10-CM

## 2023-03-18 DIAGNOSIS — G4733 Obstructive sleep apnea (adult) (pediatric): Secondary | ICD-10-CM

## 2023-03-18 MED ORDER — FLECAINIDE ACETATE 50 MG PO TABS
50.0000 mg | ORAL_TABLET | Freq: Two times a day (BID) | ORAL | 3 refills | Status: DC
  Filled 2023-03-18: qty 180, 90d supply, fill #0

## 2023-03-18 NOTE — Patient Instructions (Signed)
Medication Instructions:  Your physician has recommended you make the following change in your medication:  START: Flecainide 50 mg twice daily *If you need a refill on your cardiac medications before your next appointment, please call your pharmacy*   Lab Work: None    Testing/Procedures: None   Follow-Up: At [facility], you and your health needs are our priority.  As part of our continuing mission to provide you with exceptional heart care, we have created designated Provider Care Teams.  These Care Teams include your primary Cardiologist (physician) and Advanced Practice Providers (APPs -  Physician Assistants and Nurse Practitioners) who all work together to provide you with the care you need, when you need it.   Your next appointment:   12 week(s)  Provider:   Thomasene Ripple, DO
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
## 2023-03-19 ENCOUNTER — Encounter: Payer: Self-pay | Admitting: *Deleted

## 2023-03-19 ENCOUNTER — Other Ambulatory Visit: Payer: Self-pay | Admitting: *Deleted

## 2023-03-19 ENCOUNTER — Other Ambulatory Visit (HOSPITAL_COMMUNITY): Payer: Self-pay

## 2023-03-19 ENCOUNTER — Encounter: Payer: Self-pay | Admitting: Thoracic Surgery (Cardiothoracic Vascular Surgery)

## 2023-03-19 ENCOUNTER — Ambulatory Visit (INDEPENDENT_AMBULATORY_CARE_PROVIDER_SITE_OTHER): Payer: Medicaid Other | Admitting: Thoracic Surgery (Cardiothoracic Vascular Surgery)

## 2023-03-19 VITALS — BP 136/86 | HR 75 | Resp 20 | Ht 72.0 in | Wt 242.0 lb

## 2023-03-19 DIAGNOSIS — I7121 Aneurysm of the ascending aorta, without rupture: Secondary | ICD-10-CM

## 2023-03-19 DIAGNOSIS — M5442 Lumbago with sciatica, left side: Secondary | ICD-10-CM | POA: Diagnosis not present

## 2023-03-19 MED ORDER — CYCLOBENZAPRINE HCL 5 MG PO TABS
5.0000 mg | ORAL_TABLET | Freq: Three times a day (TID) | ORAL | 0 refills | Status: DC | PRN
  Filled 2023-03-19: qty 60, 10d supply, fill #0
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
## 2023-03-20 DIAGNOSIS — Z419 Encounter for procedure for purposes other than remedying health state, unspecified: Secondary | ICD-10-CM | POA: Diagnosis not present

## 2023-03-20 DIAGNOSIS — G4733 Obstructive sleep apnea (adult) (pediatric): Secondary | ICD-10-CM | POA: Diagnosis not present

## 2023-03-21 ENCOUNTER — Encounter (HOSPITAL_BASED_OUTPATIENT_CLINIC_OR_DEPARTMENT_OTHER): Payer: Self-pay | Admitting: Emergency Medicine

## 2023-03-21 ENCOUNTER — Emergency Department (HOSPITAL_BASED_OUTPATIENT_CLINIC_OR_DEPARTMENT_OTHER)
Admission: EM | Admit: 2023-03-21 | Discharge: 2023-03-21 | Disposition: A | Discharge status: Home / Self Care | Payer: Medicaid Other | Attending: Emergency Medicine | Admitting: Emergency Medicine

## 2023-03-21 ENCOUNTER — Emergency Department (HOSPITAL_BASED_OUTPATIENT_CLINIC_OR_DEPARTMENT_OTHER): Payer: Medicaid Other

## 2023-03-21 ENCOUNTER — Other Ambulatory Visit: Payer: Self-pay

## 2023-03-21 DIAGNOSIS — N189 Chronic kidney disease, unspecified: Secondary | ICD-10-CM | POA: Insufficient documentation

## 2023-03-21 DIAGNOSIS — Z79899 Other long term (current) drug therapy: Secondary | ICD-10-CM | POA: Diagnosis not present

## 2023-03-21 DIAGNOSIS — M1990 Unspecified osteoarthritis, unspecified site: Secondary | ICD-10-CM | POA: Insufficient documentation

## 2023-03-21 DIAGNOSIS — N179 Acute kidney failure, unspecified: Secondary | ICD-10-CM | POA: Insufficient documentation

## 2023-03-21 DIAGNOSIS — I509 Heart failure, unspecified: Secondary | ICD-10-CM | POA: Insufficient documentation

## 2023-03-21 DIAGNOSIS — I129 Hypertensive chronic kidney disease with stage 1 through stage 4 chronic kidney disease, or unspecified chronic kidney disease: Secondary | ICD-10-CM | POA: Diagnosis not present

## 2023-03-21 DIAGNOSIS — N200 Calculus of kidney: Secondary | ICD-10-CM | POA: Diagnosis not present

## 2023-03-21 DIAGNOSIS — M5136 Other intervertebral disc degeneration, lumbar region: Secondary | ICD-10-CM | POA: Insufficient documentation

## 2023-03-21 DIAGNOSIS — K5730 Diverticulosis of large intestine without perforation or abscess without bleeding: Secondary | ICD-10-CM | POA: Diagnosis not present

## 2023-03-21 DIAGNOSIS — M545 Low back pain, unspecified: Secondary | ICD-10-CM

## 2023-03-21 DIAGNOSIS — M47818 Spondylosis without myelopathy or radiculopathy, sacral and sacrococcygeal region: Secondary | ICD-10-CM

## 2023-03-21 LAB — URINALYSIS, ROUTINE W REFLEX MICROSCOPIC
Bilirubin Urine: NEGATIVE
Glucose, UA: NEGATIVE mg/dL
Ketones, ur: NEGATIVE mg/dL
Leukocytes,Ua: NEGATIVE
Nitrite: NEGATIVE
Protein, ur: NEGATIVE mg/dL
Specific Gravity, Urine: >=1.03 (ref 1.005–1.030)
pH: 5.5 (ref 5.0–8.0)

## 2023-03-21 LAB — CBC WITH DIFFERENTIAL/PLATELET
Abs Immature Granulocytes: 0.02 10*3/uL (ref 0.00–0.07)
Basophils Absolute: 0 10*3/uL (ref 0.0–0.1)
Basophils Relative: 0 %
Eosinophils Absolute: 0 10*3/uL (ref 0.0–0.5)
Eosinophils Relative: 1 %
HCT: 43.6 % (ref 39.0–52.0)
Hemoglobin: 14.8 g/dL (ref 13.0–17.0)
Immature Granulocytes: 0 %
Lymphocytes Relative: 20 %
Lymphs Abs: 1.4 10*3/uL (ref 0.7–4.0)
MCH: 30.9 pg (ref 26.0–34.0)
MCHC: 33.9 g/dL (ref 30.0–36.0)
MCV: 91 fL (ref 80.0–100.0)
Monocytes Absolute: 0.7 10*3/uL (ref 0.1–1.0)
Monocytes Relative: 11 %
Neutro Abs: 4.8 10*3/uL (ref 1.7–7.7)
Neutrophils Relative %: 68 %
Platelets: 315 10*3/uL (ref 150–400)
RBC: 4.79 MIL/uL (ref 4.22–5.81)
RDW: 13.3 % (ref 11.5–15.5)
WBC: 6.9 10*3/uL (ref 4.0–10.5)
nRBC: 0 % (ref 0.0–0.2)

## 2023-03-21 LAB — COMPREHENSIVE METABOLIC PANEL
ALT: 21 U/L (ref 0–44)
AST: 14 U/L — ABNORMAL LOW (ref 15–41)
Albumin: 3.7 g/dL (ref 3.5–5.0)
Alkaline Phosphatase: 61 U/L (ref 38–126)
Anion gap: 8 (ref 5–15)
BUN: 18 mg/dL (ref 6–20)
CO2: 24 mmol/L (ref 22–32)
Calcium: 8.5 mg/dL — ABNORMAL LOW (ref 8.9–10.3)
Chloride: 102 mmol/L (ref 98–111)
Creatinine, Ser: 1.51 mg/dL — ABNORMAL HIGH (ref 0.61–1.24)
GFR, Estimated: 56 mL/min — ABNORMAL LOW (ref >60)
Glucose, Bld: 119 mg/dL — ABNORMAL HIGH (ref 70–99)
Potassium: 3.7 mmol/L (ref 3.5–5.1)
Sodium: 134 mmol/L — ABNORMAL LOW (ref 135–145)
Total Bilirubin: 0.8 mg/dL (ref 0.3–1.2)
Total Protein: 7.4 g/dL (ref 6.5–8.1)

## 2023-03-21 LAB — URINALYSIS, MICROSCOPIC (REFLEX)

## 2023-03-21 MED ORDER — LIDOCAINE 5 % EX PTCH
1.0000 | MEDICATED_PATCH | CUTANEOUS | Status: DC
  Administered 2023-03-21: 1 via TRANSDERMAL
  Filled 2023-03-21: qty 1

## 2023-03-21 MED ORDER — LACTATED RINGERS IV BOLUS
1000.0000 mL | Freq: Once | INTRAVENOUS | Status: AC
  Administered 2023-03-21: 1000 mL via INTRAVENOUS

## 2023-03-21 MED ORDER — KETOROLAC TROMETHAMINE 15 MG/ML IJ SOLN
15.0000 mg | Freq: Once | INTRAMUSCULAR | Status: AC
  Administered 2023-03-21: 15 mg via INTRAVENOUS
  Filled 2023-03-21: qty 1

## 2023-03-21 MED ORDER — HYDROCODONE-ACETAMINOPHEN 5-325 MG PO TABS: 1.0000 | ORAL_TABLET | Freq: Four times a day (QID) | ORAL | 0 refills | Status: DC | PRN

## 2023-03-21 MED ORDER — HYDROCODONE-ACETAMINOPHEN 5-325 MG PO TABS
1.0000 | ORAL_TABLET | Freq: Once | ORAL | Status: AC
  Administered 2023-03-21: 1 via ORAL
  Filled 2023-03-21: qty 1

## 2023-03-21 MED ORDER — CYCLOBENZAPRINE HCL 5 MG PO TABS
5.0000 mg | ORAL_TABLET | Freq: Once | ORAL | Status: AC
  Administered 2023-03-21: 5 mg via ORAL
  Filled 2023-03-21: qty 1

## 2023-03-21 MED ORDER — FENTANYL CITRATE PF 50 MCG/ML IJ SOSY
50.0000 ug | PREFILLED_SYRINGE | Freq: Once | INTRAMUSCULAR | Status: AC
  Administered 2023-03-21: 50 ug via INTRAVENOUS
  Filled 2023-03-21: qty 1

## 2023-03-21 NOTE — ED Triage Notes (Signed)
Lower back pain since Thursday.  No known injury.  Seen at [facility] on Friday, given flexeril.  It helped some but not much.  Pt is ambulatory but painful.  No dysuria, no numbness or tingling.

## 2023-03-21 NOTE — Discharge Instructions (Addendum)
Your CT scan showed degenerative changes in your low back and SI joints.  Recommend you follow-up outpatient with your primary care provider regarding this.  You did have some degeneration in your SI joint and an HLA-B27 test has been sent which can help to evaluate for ankylosing spondylitis/inflammatory arthritis.  Recommend you follow-up with your PCP regarding this lab result. Return to the emergency department if you develop back pain with associated fever, new weakness or numbness in the lower extremities, numbness around the groin, new falls or trauma.   CT Abdomen Pelvis: IMPRESSION:  1. No acute abdominopelvic findings.  2. Stable 6 mm nonobstructing left renal stone.  3. 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  Recommend semi-annual imaging followup by CTA or MRA and continued  cardiothoracic surgery follow-up. This recommendation follows 2010  ACCF/AHA/AATS/ACR/ASA/SCA/SCAI/SIR/STS/SVM Guidelines for the  Diagnosis and Management of Patients With Thoracic Aortic Disease.  Circulation. 2010; 121: Z610-R604. Aortic aneurysm NOS (ICD10-I71.9)  4. Colonic diverticulosis without evidence of acute diverticulitis.     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    Alignment: Normal.    Vertebrae: No acute fracture or focal pathologic process.    Paraspinal and other soft tissues: 6 mm left intrarenal stones  stable from a CT dated 11/12/2022. Otherwise unremarkable.    Disc levels: L1-L2: Minor loss of disc height. No disc bulging or  disc herniation. No stenosis.    L2-L3: Normal.    L3-L4: Normal.    L4-L5: Small, broad base and peripherally calcified right foraminal  disc protrusion, chronic. Minor disc bulging. Minor facet  degenerative change.    L5-S1: Moderate to marked facet degenerative  change. No disc  bulging. No disc herniation.    S1-S2: Left transverse process fused with S2. No disc bulging or  disc herniation.    Left SI joint is narrowed with sclerosis. Heterotopic bone noted  along the posterior aspect of the left ilium and sacrum. Evidence of  an old, healed left sacral ala fracture.    IMPRESSION:  1. No acute fracture or acute finding.  2. Transitional lumbosacral vertebra as detailed.  3. Degenerative changes as detailed. Small chronic right foraminal  disc protrusion at L4-L5.  4. Evidence of old left sacral ala fracture. Left SI joint  degenerative/arthropathic changes.

## 2023-03-21 NOTE — ED Provider Notes (Signed)
[facility] Provider Note   CSN: [id number] Arrival date & time: 03/21/23  1019     History {Add pertinent medical, surgical, social history, OB history to HPI:1} No chief complaint on file.   Timothy Browning is a 52 y.o. male.  HPI     Home Medications Prior to Admission medications   Medication Sig Start Date End Date Taking? Authorizing Provider  cyclobenzaprine (FLEXERIL) 5 MG tablet Take 1-2 tablets (5-10 mg total) by mouth 3 (three) times daily as needed for muscle spasms for up to 10 days 03/19/23     diclofenac Sodium (VOLTAREN) 1 % GEL Apply 2 g topically 4 (four) times daily. 12/23/22   Evlyn Kanner, MD  flecainide (TAMBOCOR) 50 MG tablet Take 1 tablet (50 mg total) by mouth 2 (two) times daily. 03/18/23   Tobb, Kardie, DO  losartan (COZAAR) 25 MG tablet Take 1 tablet (25 mg total) by mouth daily. 09/23/22 09/18/23  Masters, Katie, DO  metoprolol succinate (TOPROL XL) 25 MG 24 hr tablet Take 1 tablet (25 mg total) by mouth daily. 09/23/22 09/23/23  Masters, Katie, DO  rosuvastatin (CRESTOR) 5 MG tablet Take 1 tablet (5 mg total) by mouth daily. 09/23/22   Masters, Katie, DO  sildenafil (VIAGRA) 25 MG tablet Take 100 mg by mouth daily as needed for erectile dysfunction.    [provider]  Vitamin D, Ergocalciferol, (DRISDOL) 1.25 MG (50000 UNIT) CAPS capsule Take 1 capsule (50,000 Units total) by mouth every 7 (seven) days. 11/19/22   Tobb, Lavona Mound, DO      Allergies    Patient has no known allergies.    Review of Systems   Review of Systems  Physical Exam Updated Vital Signs There were no vitals taken for this visit. Physical Exam  ED Results / Procedures / Treatments   Labs (all labs ordered are listed, but only abnormal results are displayed) Labs Reviewed - No data to display  EKG None  Radiology No results found.  Procedures Procedures  {Document cardiac monitor, telemetry assessment procedure when  appropriate:1}  Medications Ordered in ED Medications - No data to display  ED Course/ Medical Decision Making/ A&P   {   Click here for ABCD2, HEART and other calculatorsREFRESH Note before signing :1}                          Medical Decision Making Amount and/or Complexity of Data Reviewed Labs: ordered. Radiology: ordered.  Risk Prescription drug management.   ***  CT Stone: IMPRESSION:  1. No acute abdominopelvic findings.  2. Stable 6 mm nonobstructing left renal stone.  3. 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  Recommend semi-annual imaging followup by CTA or MRA and continued  cardiothoracic surgery follow-up. This recommendation follows 2010  ACCF/AHA/AATS/ACR/ASA/SCA/SCAI/SIR/STS/SVM Guidelines for the  Diagnosis and Management of Patients With Thoracic Aortic Disease.  Circulation. 2010; 121: [id number]. Aortic aneurysm NOS (ICD10-I71.9)  4. Colonic diverticulosis without evidence of acute diverticulitis.    {Document critical care time when appropriate:1} {Document review of labs and clinical decision tools ie heart score, Chads2Vasc2 etc:1}  {Document your independent review of radiology images, and any outside records:1} {Document your discussion with family members, caretakers, and with consultants:1} {Document social determinants of health affecting pt's care:1} {Document your decision making why or why not admission, treatments were needed:1} Final Clinical Impression(s) /  ED Diagnoses Final diagnoses:  None    Rx / DC Orders ED Discharge Orders     None

## 2023-03-24 DIAGNOSIS — G4733 Obstructive sleep apnea (adult) (pediatric): Secondary | ICD-10-CM | POA: Diagnosis not present

## 2023-03-25 ENCOUNTER — Other Ambulatory Visit: Payer: Self-pay

## 2023-03-25 ENCOUNTER — Other Ambulatory Visit (HOSPITAL_COMMUNITY): Payer: Self-pay

## 2023-03-25 ENCOUNTER — Ambulatory Visit (INDEPENDENT_AMBULATORY_CARE_PROVIDER_SITE_OTHER): Payer: Medicaid Other | Admitting: Student

## 2023-03-25 ENCOUNTER — Encounter: Payer: Self-pay | Admitting: Student

## 2023-03-25 VITALS — BP 133/86 | HR 92 | Temp 98.3°F | Ht 72.0 in | Wt 241.1 lb

## 2023-03-25 DIAGNOSIS — N200 Calculus of kidney: Secondary | ICD-10-CM | POA: Diagnosis not present

## 2023-03-25 DIAGNOSIS — G8929 Other chronic pain: Secondary | ICD-10-CM

## 2023-03-25 DIAGNOSIS — R002 Palpitations: Secondary | ICD-10-CM | POA: Diagnosis not present

## 2023-03-25 DIAGNOSIS — M545 Low back pain, unspecified: Secondary | ICD-10-CM | POA: Diagnosis not present

## 2023-03-25 DIAGNOSIS — I7121 Aneurysm of the ascending aorta, without rupture: Secondary | ICD-10-CM

## 2023-03-25 HISTORY — DX: Calculus of kidney: N20.0

## 2023-03-25 MED ORDER — ACETAMINOPHEN 500 MG PO TABS
1000.0000 mg | ORAL_TABLET | Freq: Three times a day (TID) | ORAL | 2 refills | Status: DC | PRN
Start: 2023-03-25 — End: 2023-07-04
  Filled 2023-03-25: qty 100, 17d supply, fill #0

## 2023-03-25 NOTE — Progress Notes (Signed)
Subjective:  CC: back pain   HPI:  [name] is a 52 y.o. male with a past medical history stated below and presents today for back pain. Please see problem based assessment and plan for additional details.  Past Medical History:  Diagnosis Date   Aortic aneurysm without rupture (HCC)    Chest pain of uncertain etiology 08/11/2021   CKD (chronic kidney disease)    GERD (gastroesophageal reflux disease)    Heart failure (HCC)    Obesity (BMI 30-39.9) 08/11/2021    Current Outpatient Medications on File Prior to Visit  Medication Sig Dispense Refill   diclofenac Sodium (VOLTAREN) 1 % GEL Apply 2 g topically 4 (four) times daily. 100 g 0   flecainide (TAMBOCOR) 50 MG tablet Take 1 tablet (50 mg total) by mouth 2 (two) times daily. 180 tablet 3   losartan (COZAAR) 25 MG tablet Take 1 tablet (25 mg total) by mouth daily. 90 tablet 3   metoprolol succinate (TOPROL XL) 25 MG 24 hr tablet Take 1 tablet (25 mg total) by mouth daily. 90 tablet 3   rosuvastatin (CRESTOR) 5 MG tablet Take 1 tablet (5 mg total) by mouth daily. 90 tablet 3   sildenafil (VIAGRA) 25 MG tablet Take 100 mg by mouth daily as needed for erectile dysfunction.     Vitamin D, Ergocalciferol, (DRISDOL) 1.25 MG (50000 UNIT) CAPS capsule Take 1 capsule (50,000 Units total) by mouth every 7 (seven) days. 12 capsule 0   No current facility-administered medications on file prior to visit.    Family History  Problem Relation Age of Onset   Cancer Sister    Hearing loss Maternal Grandmother    Cancer Maternal Grandfather     Social History   Socioeconomic History   Marital status: Married    Spouse name: n/a   Number of children: 3   Years of education: Not on file   Highest education level: Not on file  Occupational History   Occupation: painter    Comment: unemployed  Tobacco Use   Smoking status: Never   Smokeless tobacco: Never  Substance and Sexual Activity   Alcohol use: Yes     Alcohol/week: 0.0 standard drinks of alcohol    Comment: weekends, 1 pint   Drug use: No   Sexual activity: Not on file  Other Topics Concern   Not on file  Social History Narrative   Not on file   Social Determinants of Health   Financial Resource Strain: Not on file  Food Insecurity: No Food Insecurity (09/23/2022)   Hunger Vital Sign    Worried About Running Out of Food in the Last Year: Never true    Ran Out of Food in the Last Year: Never true  Recent Concern: Food Insecurity - Food Insecurity Present (07/15/2022)   Hunger Vital Sign    Worried About Running Out of Food in the Last Year: Sometimes true    Ran Out of Food in the Last Year: Sometimes true  Transportation Needs: No Transportation Needs (09/23/2022)   PRAPARE - Administrator, Civil Service (Medical): No    Lack of Transportation (Non-Medical): No  Physical Activity: Not on file  Stress: Not on file  Social Connections: Socially Isolated (09/23/2022)   Social Connection and Isolation Panel [NHANES]    Frequency of Communication with Friends and Family: More than three times a week    Frequency of Social Gatherings with Friends and Family: More than three  times a week    Attends Religious Services: Never    Active Member of Clubs or Organizations: No    Attends Banker Meetings: Never    Marital Status: Never married  Intimate Partner Violence: Not At Risk (09/23/2022)   Humiliation, Afraid, Rape, and Kick questionnaire    Fear of Current or Ex-Partner: No    Emotionally Abused: No    Physically Abused: No    Sexually Abused: No    Review of Systems: ROS negative except for what is noted on the assessment and plan.  Objective:   Vitals:   03/25/23 0845  BP: 133/86  Pulse: 92  Temp: 98.3 F (36.8 C)  TempSrc: Oral  SpO2: 97%  Weight: 241 lb 1.6 oz (109.4 kg)  Height: 6' (1.829 m)    Physical Exam: Constitutional: well-appearing male sitting in exam bed, in no acute  distress HENT: normocephalic atraumatic, mucous membranes moist Eyes: conjunctiva non-erythematous Neck: supple Cardiovascular: regular rate and rhythm, no m/r/g Pulmonary/Chest: normal work of breathing on room air, lungs clear to auscultation bilaterally Abdominal: soft, non-tender, non-distended MSK: normal bulk and tone, no paraspinal tenderness. Neurological: alert & oriented x 3, 5/5 strength in bilateral upper and lower extremities, normal gain without pain. Negative bilateral straight leg raise. Negative pain with spinal extension or flexion. Normal hip abduction and adduction without pain.Patellar and achilles reflex 2+ bilaterally. Did not perform rectal exam. Skin: warm and dry Psych: Pleasant mood and affect      Assessment & Plan:   Chronic back pain Patient presenting to clinic after recent ED visit for acute on chronic back pain. Patient was doing work around the house the day prior to [facility] and ED presentation. Woke up with worsening pain similar to pain he has had in the past but had not been evaluated with imaging.  he pain was in the lumbar spine without radiation. Previously when moving and with mild morning stiffness, but has not noticed these features lately. Patient denied fever or recent infections, recent spinal instrumentation, history of cancer though he is being followed in urology clinic for mildly increased PSA. No weight loss, changes in appetite, night sweats or changes in urine stream. No recent falls or lifting heavy objects (limitations of dilated aortic aneurysm), or changes in sensation, strength, urination or defecation.   Patient received IV pain medications in the ED; since he has not taken prescribed Norco or other pain medications as he has not needed them. He has continued with activities on daily living. Stopped taking Flexeril as this was sedating and did not help at the time.  VS within normal limits today Exam today withtout abnormalities.  See  below MSK: normal bulk and tone, no paraspinal tenderness. Neurological: alert & oriented x 3, 5/5 strength in bilateral upper and lower extremities, normal gain without pain. Negative bilateral straight leg raise. Negative pain with spinal extension or flexion. Normal hip abduction and adduction without pain. Patellar and achilles reflex 2+ bilaterally. Did not perform rectal exam.  CT abdomen and pelvis as well as L spine without acute fractures, obstructive renal stones, masses. Positive for R foraminal disc protrusion at L4-L5 L SI joint degenerative changes  Presentation likely in the setting of observed degenerative changes and dics protrusion. No evidence of fracture, risk of osteomyelitis or signs of symptoms of cauda equina. Patient advised to follow up with urology as has had increased in PSA that is to be followed with MRI/US later in July. Reassured by improvement  of symptoms without need of prescribe pain medications. Offered trial of Cymbalta but patient would like to tylenol as to avoid potentially sedating medications and polypharmacy. Can revisit at future visits. - Tylenol 1000 mg q8HR PRN - Referral to PT - Re-engage wit urology for follow PSA - Stop Norco and Flexeril today as patient is not taking    Aortic aneurysm without rupture [facility]) Patient will undergo surgery with Dr. Cliffton Asters in July. Will need cardiac clearance from Dr. Servando Salina, repeat echo and dental exam, which patient has scheduled.   Otherwise: Blood pressure under control. No evidence of volume overload today. Recent CBC and BMP without acute abnormalities. No past history of adverse reaction to anesthesia or known family history of reaction to anesthesia.  No history of seizures. Patient denies acute pulmonary symptoms. Has been adherent to cPAP for OSA.  Palpitations Not associated with dizziness. Currently on trial of flecainide 50 mg BID added to Metropolol dosing, with improvement. Patient follows with Dr.  Servando Salina. - Continue Metoprolol succinate 25 mg daily - Continue Flecainide 50 mg BID     Return in about 3 months (around 06/25/2023) for post surgical follow up.  Patient discussed with Dr. Rosalia Hammers, MD [facility] Internal Medicine Program - PGY-1 03/25/2023, 9:55 AM

## 2023-03-25 NOTE — Patient Instructions (Addendum)
Thank you, [name] for allowing [place] to provide your care today. Today we discussed your back pain and upcoming surgery  - follow up with the echoardigram - make an appointment with Dr. Servando Salina.    Referrals: - Physical therapy referral  New medications: - Tylenol 1000 mg every 8 hours.    My Chart Access: [email or web address]  Please follow-up in: after your surgery    We look forward to seeing you next time. Please call our clinic at [phone] if you have any questions or concerns. The best time to call is Monday-Friday from 9am-4pm, but there is someone available 24/7. If after hours or the weekend, call the main hospital number and ask for the Internal Medicine Resident On-Call. If you need medication refills, please notify your pharmacy one week in advance and they will send [place] a request.   Thank you for letting us take part in your care. Wishing you the best!  Morene Crocker, MD 03/25/2023, 9:22 AM [facility] Internal Medicine Resident, PGY-1

## 2023-03-25 NOTE — Assessment & Plan Note (Signed)
Patient will undergo surgery with Dr. Cliffton Asters in July. Will need cardiac clearance from Dr. Servando Salina, repeat echo and dental exam, which patient has scheduled.   Otherwise: Blood pressure under control. No evidence of volume overload today. Recent CBC and BMP without acute abnormalities. No past history of adverse reaction to anesthesia or known family history of reaction to anesthesia.  No history of seizures. Patient denies acute pulmonary symptoms. Has been adherent to cPAP for OSA.

## 2023-03-25 NOTE — Assessment & Plan Note (Addendum)
Patient presenting to clinic after recent ED visit for acute on chronic back pain. Patient was doing work around the house the day prior to [facility] and ED presentation. Woke up with worsening pain similar to pain he has had in the past but had not been evaluated with imaging.  he pain was in the lumbar spine without radiation. Previously when moving and with mild morning stiffness, but has not noticed these features lately. Patient denied fever or recent infections, recent spinal instrumentation, history of cancer though he is being followed in urology clinic for mildly increased PSA. No weight loss, changes in appetite, night sweats or changes in urine stream. No recent falls or lifting heavy objects (limitations of dilated aortic aneurysm), or changes in sensation, strength, urination or defecation.   Patient received IV pain medications in the ED; since he has not taken prescribed Norco or other pain medications as he has not needed them. He has continued with activities on daily living. Stopped taking Flexeril as this was sedating and did not help at the time.  VS within normal limits today Exam today withtout abnormalities.  See below MSK: normal bulk and tone, no paraspinal tenderness. Neurological: alert & oriented x 3, 5/5 strength in bilateral upper and lower extremities, normal gain without pain. Negative bilateral straight leg raise. Negative pain with spinal extension or flexion. Normal hip abduction and adduction without pain. Patellar and achilles reflex 2+ bilaterally. Did not perform rectal exam.  CT abdomen and pelvis as well as L spine without acute fractures, obstructive renal stones, masses. Positive for R foraminal disc protrusion at L4-L5 L SI joint degenerative changes  Presentation likely in the setting of observed degenerative changes and dics protrusion. No evidence of fracture, risk of osteomyelitis or signs of symptoms of cauda equina. Patient advised to follow up with urology as  has had increased in PSA that is to be followed with MRI/US later in July. Reassured by improvement of symptoms without need of prescribe pain medications. Offered trial of Cymbalta but patient would like to tylenol as to avoid potentially sedating medications and polypharmacy. Can revisit at future visits. - Tylenol 1000 mg q8HR PRN - Referral to PT - Re-engage wit urology for follow PSA - Stop Norco and Flexeril today as patient is not taking

## 2023-03-25 NOTE — Assessment & Plan Note (Signed)
Not associated with dizziness. Currently on trial of flecainide 50 mg BID added to Metropolol dosing, with improvement. Patient follows with Dr. Servando Salina. - Continue Metoprolol succinate 25 mg daily - Continue Flecainide 50 mg BID

## 2023-03-26 LAB — HLA-B27 ANTIGEN: HLA-B27: NEGATIVE

## 2023-03-26 NOTE — Progress Notes (Signed)
Internal Medicine Clinic Attending  Case discussed with Dr. Gomez-Caraballo  At the time of the visit.  We reviewed the resident's history and exam and pertinent patient test results.  I agree with the assessment, diagnosis, and plan of care documented in the resident's note.  

## 2023-03-29 ENCOUNTER — Ambulatory Visit (HOSPITAL_COMMUNITY)
Admission: RE | Admit: 2023-03-29 | Discharge: 2023-03-29 | Disposition: A | Discharge status: Home / Self Care | Payer: Medicaid Other | Source: Ambulatory Visit | Attending: Internal Medicine | Admitting: Internal Medicine

## 2023-03-29 ENCOUNTER — Other Ambulatory Visit (HOSPITAL_COMMUNITY): Payer: Self-pay

## 2023-03-29 DIAGNOSIS — M238X2 Other internal derangements of left knee: Secondary | ICD-10-CM | POA: Insufficient documentation

## 2023-03-29 DIAGNOSIS — M238X1 Other internal derangements of right knee: Secondary | ICD-10-CM | POA: Insufficient documentation

## 2023-03-29 DIAGNOSIS — S83511A Sprain of anterior cruciate ligament of right knee, initial encounter: Secondary | ICD-10-CM | POA: Diagnosis not present

## 2023-03-29 DIAGNOSIS — M25562 Pain in left knee: Secondary | ICD-10-CM | POA: Diagnosis not present

## 2023-04-01 ENCOUNTER — Other Ambulatory Visit (HOSPITAL_COMMUNITY): Payer: Self-pay

## 2023-04-02 ENCOUNTER — Other Ambulatory Visit (HOSPITAL_COMMUNITY): Payer: Self-pay

## 2023-04-13 ENCOUNTER — Other Ambulatory Visit: Payer: Self-pay | Admitting: Student

## 2023-04-13 DIAGNOSIS — S83511A Sprain of anterior cruciate ligament of right knee, initial encounter: Secondary | ICD-10-CM
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
## 2023-04-14 ENCOUNTER — Ambulatory Visit: Payer: Medicaid Other | Attending: Student in an Organized Health Care Education/Training Program

## 2023-04-14 ENCOUNTER — Other Ambulatory Visit: Payer: Self-pay

## 2023-04-14 DIAGNOSIS — M545 Low back pain, unspecified: Secondary | ICD-10-CM | POA: Insufficient documentation

## 2023-04-14 DIAGNOSIS — G8929 Other chronic pain: Secondary | ICD-10-CM | POA: Insufficient documentation

## 2023-04-14 DIAGNOSIS — M5459 Other low back pain: Secondary | ICD-10-CM | POA: Diagnosis not present

## 2023-04-14 DIAGNOSIS — M6281 Muscle weakness (generalized): Secondary | ICD-10-CM | POA: Insufficient documentation

## 2023-04-14 DIAGNOSIS — M62830 Muscle spasm of back: Secondary | ICD-10-CM | POA: Insufficient documentation

## 2023-04-16 ENCOUNTER — Ambulatory Visit (INDEPENDENT_AMBULATORY_CARE_PROVIDER_SITE_OTHER): Payer: Medicaid Other | Admitting: Thoracic Surgery (Cardiothoracic Vascular Surgery)

## 2023-04-16 DIAGNOSIS — I7121 Aneurysm of the ascending aorta, without rupture: Secondary | ICD-10-CM | POA: Diagnosis not present

## 2023-04-16 DIAGNOSIS — G4733 Obstructive sleep apnea (adult) (pediatric): Secondary | ICD-10-CM | POA: Diagnosis not present

## 2023-04-16 NOTE — Progress Notes (Signed)
     [address]       [place] [address]             [phone]       Patient: Home Provider: Office Consent for Telemedicine visit obtained.  Today's visit was completed via a real-time telehealth (see specific modality noted below). The patient/authorized person provided oral consent at the time of the visit to engage in a telemedicine encounter with the present provider at [facility]. The patient/authorized person was informed of the potential benefits, limitations, and risks of telemedicine. The patient/authorized person expressed understanding that the laws that protect confidentiality also apply to telemedicine. The patient/authorized person acknowledged understanding that telemedicine does not provide emergency services and that he or she would need to call 911 or proceed to the nearest hospital for help if such a need arose.   Total time spent in the clinical discussion 10 minutes.  Telehealth Modality: Phone visit (audio only)  I had a telephone visit with Mr. Dimeo.  He had multiple questions about the surgery.  I will bring him in for an appointment on July 12.

## 2023-04-19 DIAGNOSIS — G4733 Obstructive sleep apnea (adult) (pediatric): Secondary | ICD-10-CM | POA: Diagnosis not present

## 2023-04-19 DIAGNOSIS — Z419 Encounter for procedure for purposes other than remedying health state, unspecified: Secondary | ICD-10-CM | POA: Diagnosis not present

## 2023-04-20 ENCOUNTER — Encounter: Payer: Self-pay | Admitting: Student

## 2023-04-20 ENCOUNTER — Ambulatory Visit: Payer: Medicaid Other | Admitting: Student

## 2023-04-20 ENCOUNTER — Other Ambulatory Visit (HOSPITAL_COMMUNITY): Payer: Self-pay

## 2023-04-20 ENCOUNTER — Ambulatory Visit (HOSPITAL_COMMUNITY)
Admission: RE | Admit: 2023-04-20 | Discharge: 2023-04-20 | Disposition: A | Discharge status: Home / Self Care | Payer: Medicaid Other | Source: Ambulatory Visit | Attending: Internal Medicine | Admitting: Internal Medicine

## 2023-04-20 VITALS — BP 112/79 | HR 73 | Temp 98.2°F | Ht 72.0 in | Wt 239.0 lb

## 2023-04-20 DIAGNOSIS — S83512D Sprain of anterior cruciate ligament of left knee, subsequent encounter: Secondary | ICD-10-CM

## 2023-04-20 DIAGNOSIS — I7121 Aneurysm of the ascending aorta, without rupture: Secondary | ICD-10-CM | POA: Diagnosis not present

## 2023-04-20 DIAGNOSIS — R4589 Other symptoms and signs involving emotional state: Secondary | ICD-10-CM | POA: Diagnosis not present

## 2023-04-20 DIAGNOSIS — R079 Chest pain, unspecified: Secondary | ICD-10-CM | POA: Insufficient documentation

## 2023-04-20 DIAGNOSIS — S83511D Sprain of anterior cruciate ligament of right knee, subsequent encounter: Secondary | ICD-10-CM | POA: Diagnosis not present

## 2023-04-20 MED ORDER — HYDROXYZINE HCL 50 MG PO TABS
50.0000 mg | ORAL_TABLET | Freq: Three times a day (TID) | ORAL | 0 refills | Status: DC | PRN
  Filled 2023-04-20: qty 30, 10d supply, fill #0
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
## 2023-04-20 NOTE — Patient Instructions (Signed)
This after visit summary is an important review of tests, referrals, and medication changes that were discussed during your visit. If you have questions or concerns, call [phone]. Outside of clinic business hours, call the main hospital at [phone] and ask the operator for the on-call internal medicine resident.   Ernesta Amble MD 04/20/2023, 11:58 AM

## 2023-04-21 DIAGNOSIS — R4589 Other symptoms and signs involving emotional state: Secondary | ICD-10-CM | POA: Insufficient documentation

## 2023-04-21 NOTE — Assessment & Plan Note (Addendum)
Reports a significant amount of anxiety, near constant worry about health status, and I suspect his somatic symptoms are related to this.  Was never an anxious person prior to the start of his health concerns.  Middling score of 9 on GAD-7.  Will do a trial of an anxiolytic to see if this helps his symptoms.  Referral to integrated behavioral health placed. - Hydroxyzine 50 mg 3 times daily as needed for anxiety

## 2023-04-21 NOTE — Assessment & Plan Note (Signed)
Planning to proceed with valve sparing aortic root replacement.  Seems well optimized from medical standpoint.
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
## 2023-04-21 NOTE — Assessment & Plan Note (Signed)
Chest pain is atypical for angina.  History of left heart cath in 2023 with normal coronary arteries.  Cardiovascular exam today is routine.  EKG obtained in clinic is reassuring, no ischemic type ST segment changes, essentially unchanged from last study in September 2023.  Doubt this is referred visceral pain from aneurysm.  I wonder if this is related to anxiety or panic disorder which is a diagnosis of exclusion, but at this point he has been worked up extensively for cardiopulmonary cause of this pain without diagnosis.  Reassured that I do not think this is dangerous or life-threatening, nor do I think this should be a barrier to proceeding with surgery.
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
## 2023-04-23 ENCOUNTER — Ambulatory Visit (INDEPENDENT_AMBULATORY_CARE_PROVIDER_SITE_OTHER): Payer: Medicaid Other | Admitting: Orthopaedic Surgery

## 2023-04-23 ENCOUNTER — Encounter: Payer: Self-pay | Admitting: Orthopaedic Surgery

## 2023-04-23 DIAGNOSIS — M1712 Unilateral primary osteoarthritis, left knee: Secondary | ICD-10-CM | POA: Diagnosis not present

## 2023-04-23 DIAGNOSIS — M170 Bilateral primary osteoarthritis of knee: Secondary | ICD-10-CM

## 2023-04-23 DIAGNOSIS — M1711 Unilateral primary osteoarthritis, right knee: Secondary | ICD-10-CM

## 2023-04-23 MED ORDER — BUPIVACAINE HCL 0.5 % IJ SOLN
2.0000 mL | INTRAMUSCULAR | Status: AC | PRN
Start: 2023-04-23 — End: 2023-04-23
  Administered 2023-04-23: 2 mL via INTRA_ARTICULAR

## 2023-04-23 MED ORDER — LIDOCAINE HCL 1 % IJ SOLN
2.0000 mL | INTRAMUSCULAR | Status: AC | PRN
Start: 2023-04-23 — End: 2023-04-23
  Administered 2023-04-23: 2 mL

## 2023-04-23 MED ORDER — METHYLPREDNISOLONE ACETATE 40 MG/ML IJ SUSP
40.00 mg | INTRAMUSCULAR | Status: AC | PRN
Start: 2023-04-23 — End: 2023-04-23
  Administered 2023-04-23: 40 mg via INTRA_ARTICULAR

## 2023-04-23 MED ORDER — BUPIVACAINE HCL 0.5 % IJ SOLN
2.00 mL | INTRAMUSCULAR | Status: AC | PRN
Start: 2023-04-23 — End: 2023-04-23
  Administered 2023-04-23: 2 mL via INTRA_ARTICULAR

## 2023-04-23 MED ORDER — METHYLPREDNISOLONE ACETATE 40 MG/ML IJ SUSP
40.0000 mg | INTRAMUSCULAR | Status: AC | PRN
Start: 2023-04-23 — End: 2023-04-23
  Administered 2023-04-23: 40 mg via INTRA_ARTICULAR

## 2023-04-23 MED ORDER — LIDOCAINE HCL 1 % IJ SOLN
2.00 mL | INTRAMUSCULAR | Status: AC | PRN
Start: 2023-04-23 — End: 2023-04-23
  Administered 2023-04-23: 2 mL

## 2023-04-23 NOTE — Progress Notes (Signed)
[facility] Clinic Attending  Case discussed with Dr. McLendon  At the time of the visit.  We reviewed the resident's history and exam and pertinent patient test results.  I agree with the assessment, diagnosis, and plan of care documented in the resident's note.  
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
## 2023-04-28 ENCOUNTER — Ambulatory Visit: Payer: Medicaid Other | Attending: Student in an Organized Health Care Education/Training Program

## 2023-04-28 ENCOUNTER — Other Ambulatory Visit (HOSPITAL_COMMUNITY): Payer: Self-pay

## 2023-04-28 DIAGNOSIS — M6281 Muscle weakness (generalized): Secondary | ICD-10-CM | POA: Insufficient documentation

## 2023-04-28 DIAGNOSIS — M5459 Other low back pain: Secondary | ICD-10-CM | POA: Diagnosis not present

## 2023-04-30 ENCOUNTER — Encounter: Payer: Self-pay | Admitting: Thoracic Surgery (Cardiothoracic Vascular Surgery)

## 2023-04-30 ENCOUNTER — Other Ambulatory Visit (HOSPITAL_COMMUNITY): Payer: Self-pay

## 2023-04-30 ENCOUNTER — Ambulatory Visit (INDEPENDENT_AMBULATORY_CARE_PROVIDER_SITE_OTHER): Payer: Medicaid Other | Admitting: Thoracic Surgery (Cardiothoracic Vascular Surgery)

## 2023-04-30 VITALS — BP 126/85 | HR 73 | Resp 18 | Ht 72.0 in | Wt 236.0 lb

## 2023-04-30 DIAGNOSIS — I7121 Aneurysm of the ascending aorta, without rupture: Secondary | ICD-10-CM
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
## 2023-05-03 NOTE — Progress Notes (Signed)
Surgical Instructions    Your procedure is scheduled on Thursday, July 18th,2024.   Report to [facility] Main Entrance "A" at 05:30 A.M., then check in with the Admitting office.  Call this number if you have problems the morning of surgery:  [phone]   If you have any questions prior to your surgery date call [phone]: Open Monday-Friday 8am-4pm If you experience any cold or flu symptoms such as cough, fever, chills, shortness of breath, etc. between now and your scheduled surgery, please notify us at the above number     Remember:  Do not eat or drink after midnight the night before your surgery    Take these medicines the morning of surgery with A SIP OF WATER:   flecainide (TAMBOCOR)  metoprolol succinate (TOPROL XL)  rosuvastatin (CRESTOR)   As needed:  acetaminophen (TYLENOL)  cyclobenzaprine (FLEXERIL)  hydrOXYzine (ATARAX)   As of today, STOP taking any Aspirin (unless otherwise instructed by your surgeon) Aleve, Naproxen, Ibuprofen, Motrin, Advil, Goody's, BC's, all herbal medications, fish oil, and all vitamins.  The day of surgery:          Do not wear jewelry  Do not wear lotions, powders, cologne or deodorant. Men may shave face and neck. Do not bring valuables to the hospital.   [facility] is not responsible for any belongings or valuables.    Do NOT Smoke (Tobacco/Vaping)  24 hours prior to your procedure  If you use a CPAP at night, you may bring your mask for your overnight stay.   Contacts, glasses, hearing aids, dentures or partials may not be worn into surgery, please bring cases for these belongings   For patients admitted to the hospital, discharge time will be determined by your treatment team.   Patients discharged the day of surgery will not be allowed to drive home, and someone needs to stay with them for 24 hours.   SURGICAL WAITING ROOM VISITATION Patients having surgery or a procedure may have no more than 2 support people in  the waiting area - these visitors may rotate.   Children under the age of 76 must have an adult with them who is not the patient. If the patient needs to stay at the hospital during part of their recovery, the visitor guidelines for inpatient rooms apply. Pre-op nurse will coordinate an appropriate time for 1 support person to accompany patient in pre-op.  This support person may not rotate.   Please refer to [email or web address] for the visitor guidelines for Inpatients (after your surgery is over and you are in a regular room).    Special instructions:    Oral Hygiene is also important to reduce your risk of infection.  Remember - BRUSH YOUR TEETH THE MORNING OF SURGERY WITH YOUR REGULAR TOOTHPASTE   [facility]- Preparing For Surgery  Before surgery, you can play an important role. Because skin is not sterile, your skin needs to be as free of germs as possible. You can reduce the number of germs on your skin by washing with CHG (chlorahexidine gluconate) Soap before surgery.  CHG is an antiseptic cleaner which kills germs and bonds with the skin to continue killing germs even after washing.     Please do not use if you have an allergy to CHG or antibacterial soaps. If your skin becomes reddened/irritated stop using the CHG.  Do not shave (including legs and underarms) for at least 48 hours prior to first CHG shower. It is OK to shave your  face.  Please follow these instructions carefully.     Shower the NIGHT BEFORE SURGERY and the MORNING OF SURGERY with CHG Soap.   If you chose to wash your hair, wash your hair first as usual with your normal shampoo. After you shampoo, rinse your hair and body thoroughly to remove the shampoo.  Then [employer] and genitals (private parts) with your normal soap and rinse thoroughly to remove soap.  After that Use CHG Soap as you would any other liquid soap. You can apply CHG directly to the skin and wash  gently with a scrungie or a clean washcloth.   Apply the CHG Soap to your body ONLY FROM THE NECK DOWN.  Do not use on open wounds or open sores. Avoid contact with your eyes, ears, mouth and genitals (private parts). Wash Face and genitals (private parts)  with your normal soap.   Wash thoroughly, paying special attention to the area where your surgery will be performed.  Thoroughly rinse your body with warm water from the neck down.  DO NOT shower/wash with your normal soap after using and rinsing off the CHG Soap.  Pat yourself dry with a CLEAN TOWEL.  Wear CLEAN PAJAMAS to bed the night before surgery  Place CLEAN SHEETS on your bed the night before your surgery  DO NOT SLEEP WITH PETS.   Day of Surgery:  Take a shower with CHG soap. Wear Clean/Comfortable clothing the morning of surgery Do not apply any deodorants/lotions.   Remember to brush your teeth WITH YOUR REGULAR TOOTHPASTE.    If you received a COVID test during your pre-op visit, it is requested that you wear a mask when out in public, stay away from anyone that may not be feeling well, and notify your surgeon if you develop symptoms. If you have been in contact with anyone that has tested positive in the last 10 days, please notify your surgeon.    Please read over the following fact sheets that you were given.

## 2023-05-04 ENCOUNTER — Ambulatory Visit: Payer: Medicaid Other

## 2023-05-04 ENCOUNTER — Encounter (HOSPITAL_COMMUNITY): Payer: Self-pay

## 2023-05-04 ENCOUNTER — Encounter (HOSPITAL_COMMUNITY)
Admission: RE | Admit: 2023-05-04 | Discharge: 2023-05-04 | Disposition: A | Discharge status: Home / Self Care | Payer: Medicaid Other | Source: Ambulatory Visit | Attending: Thoracic Surgery (Cardiothoracic Vascular Surgery) | Admitting: Thoracic Surgery (Cardiothoracic Vascular Surgery)

## 2023-05-04 ENCOUNTER — Ambulatory Visit (HOSPITAL_COMMUNITY)
Admission: RE | Admit: 2023-05-04 | Discharge: 2023-05-04 | Disposition: A | Discharge status: Home / Self Care | Payer: Medicaid Other | Source: Ambulatory Visit | Attending: Thoracic Surgery (Cardiothoracic Vascular Surgery) | Admitting: Thoracic Surgery (Cardiothoracic Vascular Surgery)

## 2023-05-04 ENCOUNTER — Ambulatory Visit (HOSPITAL_BASED_OUTPATIENT_CLINIC_OR_DEPARTMENT_OTHER)
Admission: RE | Admit: 2023-05-04 | Discharge: 2023-05-04 | Disposition: A | Discharge status: Home / Self Care | Payer: Medicaid Other | Source: Ambulatory Visit | Attending: Thoracic Surgery (Cardiothoracic Vascular Surgery) | Admitting: Thoracic Surgery (Cardiothoracic Vascular Surgery)

## 2023-05-04 ENCOUNTER — Other Ambulatory Visit: Payer: Self-pay

## 2023-05-04 VITALS — BP 118/91 | HR 68 | Temp 98.4°F | Resp 17 | Ht 72.0 in | Wt 237.0 lb

## 2023-05-04 DIAGNOSIS — I517 Cardiomegaly: Secondary | ICD-10-CM | POA: Insufficient documentation

## 2023-05-04 DIAGNOSIS — I10 Essential (primary) hypertension: Secondary | ICD-10-CM | POA: Insufficient documentation

## 2023-05-04 DIAGNOSIS — G4730 Sleep apnea, unspecified: Secondary | ICD-10-CM | POA: Diagnosis not present

## 2023-05-04 DIAGNOSIS — Z01818 Encounter for other preprocedural examination: Secondary | ICD-10-CM | POA: Diagnosis not present

## 2023-05-04 DIAGNOSIS — I7121 Aneurysm of the ascending aorta, without rupture: Secondary | ICD-10-CM

## 2023-05-04 DIAGNOSIS — Z1152 Encounter for screening for COVID-19: Secondary | ICD-10-CM | POA: Insufficient documentation

## 2023-05-04 DIAGNOSIS — E785 Hyperlipidemia, unspecified: Secondary | ICD-10-CM | POA: Diagnosis not present

## 2023-05-04 HISTORY — DX: Essential (primary) hypertension: I10

## 2023-05-04 HISTORY — DX: Sleep apnea, unspecified: G47.30

## 2023-05-04 LAB — COMPREHENSIVE METABOLIC PANEL
ALT: 39 U/L (ref 0–44)
AST: 20 U/L (ref 15–41)
Albumin: 3.7 g/dL (ref 3.5–5.0)
Alkaline Phosphatase: 56 U/L (ref 38–126)
Anion gap: 8 (ref 5–15)
BUN: 17 mg/dL (ref 6–20)
CO2: 21 mmol/L — ABNORMAL LOW (ref 22–32)
Calcium: 8.7 mg/dL — ABNORMAL LOW (ref 8.9–10.3)
Chloride: 104 mmol/L (ref 98–111)
Creatinine, Ser: 1.33 mg/dL — ABNORMAL HIGH (ref 0.61–1.24)
GFR, Estimated: >60 mL/min (ref >60)
Glucose, Bld: 90 mg/dL (ref 70–99)
Potassium: 4 mmol/L (ref 3.5–5.1)
Sodium: 133 mmol/L — ABNORMAL LOW (ref 135–145)
Total Bilirubin: 0.7 mg/dL (ref 0.3–1.2)
Total Protein: 7.1 g/dL (ref 6.5–8.1)

## 2023-05-04 LAB — ECHOCARDIOGRAM COMPLETE
AR max vel: 4.46 cm2
AV Area VTI: 4.32 cm2
AV Area mean vel: 4.29 cm2
AV Mean grad: 1 mmHg
AV Peak grad: 2 mmHg
Ao pk vel: 0.71 m/s
Area-P 1/2: 3.31 cm2
Height: 72 in
S' Lateral: 3 cm
Weight: 3792 oz

## 2023-05-04 LAB — URINALYSIS, ROUTINE W REFLEX MICROSCOPIC
Bacteria, UA: NONE SEEN
Bilirubin Urine: NEGATIVE
Glucose, UA: NEGATIVE mg/dL
Ketones, ur: NEGATIVE mg/dL
Leukocytes,Ua: NEGATIVE
Nitrite: NEGATIVE
Protein, ur: NEGATIVE mg/dL
Specific Gravity, Urine: 1.024 (ref 1.005–1.030)
pH: 5 (ref 5.0–8.0)

## 2023-05-04 LAB — CBC
HCT: 47 % (ref 39.0–52.0)
Hemoglobin: 15.8 g/dL (ref 13.0–17.0)
MCH: 31.3 pg (ref 26.0–34.0)
MCHC: 33.6 g/dL (ref 30.0–36.0)
MCV: 93.1 fL (ref 80.0–100.0)
Platelets: 337 10*3/uL (ref 150–400)
RBC: 5.05 MIL/uL (ref 4.22–5.81)
RDW: 13.9 % (ref 11.5–15.5)
WBC: 7.3 10*3/uL (ref 4.0–10.5)
nRBC: 0 % (ref 0.0–0.2)

## 2023-05-04 LAB — BLOOD GAS, ARTERIAL
Acid-base deficit: 1.1 mmol/L (ref 0.0–2.0)
Bicarbonate: 23.5 mmol/L (ref 20.0–28.0)
Drawn by: 58793
O2 Saturation: 97.4 %
Patient temperature: 37
pCO2 arterial: 38 mmHg (ref 32–48)
pH, Arterial: 7.4 (ref 7.35–7.45)
pO2, Arterial: 89 mmHg (ref 83–108)

## 2023-05-04 LAB — HEMOGLOBIN A1C
Hgb A1c MFr Bld: 5.4 % (ref 4.8–5.6)
Mean Plasma Glucose: 108.28 mg/dL

## 2023-05-04 LAB — APTT: aPTT: 28 seconds (ref 24–36)

## 2023-05-04 LAB — TYPE AND SCREEN
ABO/RH(D): O POS
Antibody Screen: NEGATIVE

## 2023-05-04 LAB — SURGICAL PCR SCREEN
MRSA, PCR: NEGATIVE
Staphylococcus aureus: NEGATIVE

## 2023-05-04 LAB — PROTIME-INR
INR: 1 (ref 0.8–1.2)
Prothrombin Time: 13.7 seconds (ref 11.4–15.2)

## 2023-05-04 NOTE — Progress Notes (Addendum)
PCP - Dr. Marrianne Mood Cardiologist - Dr. Thomasene Ripple  PPM/ICD - n/a  Chest x-ray - 05/04/23 EKG - 04/20/23 Stress Test - denies ECHO - 07/14/22 Cardiac Cath - 07/15/22  Sleep Study - OSA+ CPAP - uses most nights  Blood Thinner Instructions: n/a Aspirin Instructions: n/a  NPO  COVID TEST- 05/04/23, done in PAT  Anesthesia review: Yes.  Patient denies shortness of breath, fever, cough and chest pain at PAT appointment  Pt reported to this RN that he has a lesion/growth area on the midline of his chest that is filled with pus. Pt stated these normally form on his chest and face and go away once he pops and drains them. Pt stated that he showed this lesion to Dr. Cliffton Asters and was advised to get it checked out by his PCP. His PCP assessed the growth and told pt that he did not need to worry about it or do anything to it. This RN assessed the lesion and it was not open, oozing, draining or bleeding. Dr. Cliffton Asters was made aware as the lesion is very close to incision area. Dr. Cliffton Asters did not place any new orders at this time.    All instructions explained to the patient, with a verbal understanding of the material. Patient agrees to go over the instructions while at home for a better understanding. Patient also instructed to self quarantine after being tested for COVID-19. The opportunity to ask questions was provided.

## 2023-05-04 NOTE — Progress Notes (Signed)
*  PRELIMINARY RESULTS* Echocardiogram 2D Echocardiogram has been performed.  Timothy Browning 05/04/2023, 2:37 PM

## 2023-05-05 ENCOUNTER — Encounter: Payer: Self-pay | Admitting: Physical Therapy

## 2023-05-05 ENCOUNTER — Ambulatory Visit: Payer: Medicaid Other | Admitting: Physical Therapy

## 2023-05-05 DIAGNOSIS — M5459 Other low back pain: Secondary | ICD-10-CM

## 2023-05-05 DIAGNOSIS — M6281 Muscle weakness (generalized): Secondary | ICD-10-CM

## 2023-05-05 LAB — SARS CORONAVIRUS 2 (TAT 6-24 HRS): SARS Coronavirus 2: NEGATIVE

## 2023-05-05 MED ORDER — TRANEXAMIC ACID (OHS) PUMP PRIME SOLUTION
2.0000 mg/kg | INTRAVENOUS | Status: DC
  Filled 2023-05-05: qty 2.15

## 2023-05-05 MED ORDER — NITROGLYCERIN IN D5W 200-5 MCG/ML-% IV SOLN
2.0000 ug/min | INTRAVENOUS | Status: DC
  Filled 2023-05-05: qty 250

## 2023-05-05 MED ORDER — INSULIN REGULAR(HUMAN) IN NACL 100-0.9 UT/100ML-% IV SOLN
INTRAVENOUS | Status: AC
  Administered 2023-05-06: 1 [IU]/h via INTRAVENOUS
  Filled 2023-05-05: qty 100

## 2023-05-05 MED ORDER — TRANEXAMIC ACID 1000 MG/10ML IV SOLN
1.5000 mg/kg/h | INTRAVENOUS | Status: AC
  Administered 2023-05-06: 1.5 mg/kg/h via INTRAVENOUS
  Filled 2023-05-05: qty 25

## 2023-05-05 MED ORDER — MANNITOL 20 % IV SOLN
INTRAVENOUS | Status: DC
  Filled 2023-05-05: qty 13

## 2023-05-05 MED ORDER — POTASSIUM CHLORIDE 2 MEQ/ML IV SOLN
80.0000 meq | INTRAVENOUS | Status: DC
  Filled 2023-05-05: qty 40

## 2023-05-05 MED ORDER — CEFAZOLIN SODIUM-DEXTROSE 2-4 GM/100ML-% IV SOLN
2.0000 g | INTRAVENOUS | Status: DC
  Filled 2023-05-05: qty 100

## 2023-05-05 MED ORDER — EPINEPHRINE HCL 5 MG/250ML IV SOLN IN NS
0.0000 ug/min | INTRAVENOUS | Status: DC
  Filled 2023-05-05: qty 250

## 2023-05-05 MED ORDER — CEFAZOLIN SODIUM-DEXTROSE 2-4 GM/100ML-% IV SOLN
2.0000 g | INTRAVENOUS | Status: AC
  Administered 2023-05-06 (×2): 2 g via INTRAVENOUS
  Filled 2023-05-05: qty 100

## 2023-05-05 MED ORDER — HEPARIN 30,000 UNITS/1000 ML (OHS) CELLSAVER SOLUTION
Status: DC
  Filled 2023-05-05: qty 1000

## 2023-05-05 MED ORDER — MILRINONE LACTATE IN DEXTROSE 20-5 MG/100ML-% IV SOLN
0.3000 ug/kg/min | INTRAVENOUS | Status: DC
  Filled 2023-05-05: qty 100

## 2023-05-05 MED ORDER — PHENYLEPHRINE HCL-NACL 20-0.9 MG/250ML-% IV SOLN
30.0000 ug/min | INTRAVENOUS | Status: AC
  Administered 2023-05-06: 25 ug/min via INTRAVENOUS
  Filled 2023-05-05: qty 250

## 2023-05-05 MED ORDER — TRANEXAMIC ACID (OHS) BOLUS VIA INFUSION
15.0000 mg/kg | INTRAVENOUS | Status: AC
  Administered 2023-05-06: 1612.5 mg via INTRAVENOUS
  Filled 2023-05-05: qty 1613

## 2023-05-05 MED ORDER — NOREPINEPHRINE 4 MG/250ML-% IV SOLN
0.0000 ug/min | INTRAVENOUS | Status: DC
  Filled 2023-05-05: qty 250

## 2023-05-05 MED ORDER — DEXMEDETOMIDINE HCL IN NACL 400 MCG/100ML IV SOLN
0.1000 ug/kg/h | INTRAVENOUS | Status: AC
  Administered 2023-05-06: .3 ug/kg/h via INTRAVENOUS
  Filled 2023-05-05: qty 100

## 2023-05-05 MED ORDER — VANCOMYCIN HCL 1500 MG/300ML IV SOLN
1500.0000 mg | INTRAVENOUS | Status: AC
  Administered 2023-05-06: 1500 mg via INTRAVENOUS
  Filled 2023-05-05: qty 300

## 2023-05-05 MED ORDER — PLASMA-LYTE A IV SOLN
INTRAVENOUS | Status: DC
  Filled 2023-05-05: qty 2.5

## 2023-05-05 NOTE — Anesthesia Preprocedure Evaluation (Addendum)
Anesthesia Evaluation  Patient identified by MRN, date of birth, ID band Patient awake    Reviewed: Allergy & Precautions, NPO status , Patient's Chart, lab work & pertinent test results, reviewed documented beta blocker date and time   History of Anesthesia Complications Negative for: history of anesthetic complications  Airway Mallampati: II  TM Distance: >3 FB Neck ROM: Full    Dental  (+) Dental Advisory Given, Teeth Intact   Pulmonary sleep apnea    Pulmonary exam normal        Cardiovascular hypertension, Pt. on medications and Pt. on home beta blockers + Peripheral Vascular Disease  Normal cardiovascular exam   '24 TTE - EF 55 to 60%. There is mild left ventricular hypertrophy of the basal-septal segment. Aortic valve regurgitation is trivial. Aortic dilatation noted. There is severe dilatation of the aortic root, measuring 52 mm. There is moderate dilatation of the ascending aorta, measuring 45 mm.     Neuro/Psych  PSYCHIATRIC DISORDERS Anxiety     negative neurological ROS     GI/Hepatic Neg liver ROS,GERD  Controlled,,  Endo/Other   Obesity   Renal/GU CRFRenal disease     Musculoskeletal negative musculoskeletal ROS (+)    Abdominal   Peds  Hematology  S/p splenectomy    Anesthesia Other Findings   Reproductive/Obstetrics                             Anesthesia Physical Anesthesia Plan  ASA: 4  Anesthesia Plan: General   Post-op Pain Management:    Induction: Intravenous  PONV Risk Score and Plan: 2 and Treatment may vary due to age or medical condition, Ondansetron and Midazolam  Airway Management Planned: Oral ETT  Additional Equipment: Arterial line, CVP, TEE and Ultrasound Guidance Line Placement  Intra-op Plan:   Post-operative Plan: Post-operative intubation/ventilation  Informed Consent: I have reviewed the patients History and Physical, chart, labs and  discussed the procedure including the risks, benefits and alternatives for the proposed anesthesia with the patient or authorized representative who has indicated his/her understanding and acceptance.     Dental advisory given  Plan Discussed with: CRNA and Anesthesiologist  Anesthesia Plan Comments:        Anesthesia Quick Evaluation
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
## 2023-05-06 ENCOUNTER — Inpatient Hospital Stay (HOSPITAL_COMMUNITY): Payer: Medicaid Other | Admitting: Certified Registered Nurse Anesthetist

## 2023-05-06 ENCOUNTER — Other Ambulatory Visit: Payer: Self-pay

## 2023-05-06 ENCOUNTER — Inpatient Hospital Stay (HOSPITAL_COMMUNITY): Payer: Medicaid Other

## 2023-05-06 ENCOUNTER — Inpatient Hospital Stay (HOSPITAL_COMMUNITY)
Admission: RE | Admit: 2023-05-06 | Discharge: 2023-05-14 | DRG: 219 | Disposition: A | Discharge status: Home / Self Care | Payer: Medicaid Other | Source: Ambulatory Visit | Attending: Thoracic Surgery (Cardiothoracic Vascular Surgery) | Admitting: Thoracic Surgery (Cardiothoracic Vascular Surgery)

## 2023-05-06 ENCOUNTER — Encounter (HOSPITAL_COMMUNITY)
Admission: RE | Disposition: A | Discharge status: Home / Self Care | Payer: Self-pay | Source: Ambulatory Visit | Attending: Thoracic Surgery (Cardiothoracic Vascular Surgery)

## 2023-05-06 ENCOUNTER — Inpatient Hospital Stay (HOSPITAL_COMMUNITY): Payer: Medicaid Other | Admitting: Physician Assistant

## 2023-05-06 ENCOUNTER — Encounter (HOSPITAL_COMMUNITY): Payer: Self-pay | Admitting: Thoracic Surgery (Cardiothoracic Vascular Surgery)

## 2023-05-06 DIAGNOSIS — I442 Atrioventricular block, complete: Secondary | ICD-10-CM | POA: Diagnosis not present

## 2023-05-06 DIAGNOSIS — Z9081 Acquired absence of spleen: Secondary | ICD-10-CM | POA: Diagnosis not present

## 2023-05-06 DIAGNOSIS — I4892 Unspecified atrial flutter: Secondary | ICD-10-CM | POA: Diagnosis not present

## 2023-05-06 DIAGNOSIS — I7121 Aneurysm of the ascending aorta, without rupture: Secondary | ICD-10-CM | POA: Diagnosis not present

## 2023-05-06 DIAGNOSIS — N179 Acute kidney failure, unspecified: Secondary | ICD-10-CM | POA: Diagnosis not present

## 2023-05-06 DIAGNOSIS — Z79899 Other long term (current) drug therapy: Secondary | ICD-10-CM

## 2023-05-06 DIAGNOSIS — E876 Hypokalemia: Secondary | ICD-10-CM | POA: Diagnosis present

## 2023-05-06 DIAGNOSIS — N183 Chronic kidney disease, stage 3 unspecified: Secondary | ICD-10-CM

## 2023-05-06 DIAGNOSIS — I719 Aortic aneurysm of unspecified site, without rupture: Secondary | ICD-10-CM | POA: Diagnosis not present

## 2023-05-06 DIAGNOSIS — J90 Pleural effusion, not elsewhere classified: Secondary | ICD-10-CM | POA: Diagnosis not present

## 2023-05-06 DIAGNOSIS — M545 Low back pain, unspecified: Secondary | ICD-10-CM | POA: Diagnosis present

## 2023-05-06 DIAGNOSIS — I712 Thoracic aortic aneurysm, without rupture, unspecified: Secondary | ICD-10-CM | POA: Diagnosis not present

## 2023-05-06 DIAGNOSIS — J9811 Atelectasis: Secondary | ICD-10-CM | POA: Diagnosis not present

## 2023-05-06 DIAGNOSIS — R110 Nausea: Secondary | ICD-10-CM | POA: Diagnosis not present

## 2023-05-06 DIAGNOSIS — R002 Palpitations: Secondary | ICD-10-CM | POA: Diagnosis not present

## 2023-05-06 DIAGNOSIS — I517 Cardiomegaly: Secondary | ICD-10-CM | POA: Diagnosis not present

## 2023-05-06 DIAGNOSIS — I4891 Unspecified atrial fibrillation: Secondary | ICD-10-CM

## 2023-05-06 DIAGNOSIS — K9429 Other complications of gastrostomy: Secondary | ICD-10-CM | POA: Diagnosis not present

## 2023-05-06 DIAGNOSIS — I5022 Chronic systolic (congestive) heart failure: Secondary | ICD-10-CM | POA: Diagnosis not present

## 2023-05-06 DIAGNOSIS — Z9049 Acquired absence of other specified parts of digestive tract: Secondary | ICD-10-CM

## 2023-05-06 DIAGNOSIS — I358 Other nonrheumatic aortic valve disorders: Secondary | ICD-10-CM | POA: Diagnosis not present

## 2023-05-06 DIAGNOSIS — R0602 Shortness of breath: Secondary | ICD-10-CM | POA: Diagnosis not present

## 2023-05-06 DIAGNOSIS — I130 Hypertensive heart and chronic kidney disease with heart failure and stage 1 through stage 4 chronic kidney disease, or unspecified chronic kidney disease: Secondary | ICD-10-CM | POA: Diagnosis present

## 2023-05-06 DIAGNOSIS — Z4682 Encounter for fitting and adjustment of non-vascular catheter: Secondary | ICD-10-CM | POA: Diagnosis not present

## 2023-05-06 DIAGNOSIS — E785 Hyperlipidemia, unspecified: Secondary | ICD-10-CM | POA: Diagnosis not present

## 2023-05-06 DIAGNOSIS — D72829 Elevated white blood cell count, unspecified: Secondary | ICD-10-CM | POA: Diagnosis not present

## 2023-05-06 DIAGNOSIS — J939 Pneumothorax, unspecified: Secondary | ICD-10-CM | POA: Diagnosis not present

## 2023-05-06 DIAGNOSIS — Z9911 Dependence on respirator [ventilator] status: Secondary | ICD-10-CM

## 2023-05-06 DIAGNOSIS — Q2543 Congenital aneurysm of aorta: Secondary | ICD-10-CM | POA: Diagnosis not present

## 2023-05-06 DIAGNOSIS — R008 Other abnormalities of heart beat: Secondary | ICD-10-CM | POA: Diagnosis not present

## 2023-05-06 DIAGNOSIS — G4733 Obstructive sleep apnea (adult) (pediatric): Secondary | ICD-10-CM | POA: Diagnosis present

## 2023-05-06 DIAGNOSIS — K219 Gastro-esophageal reflux disease without esophagitis: Secondary | ICD-10-CM | POA: Diagnosis present

## 2023-05-06 DIAGNOSIS — I480 Paroxysmal atrial fibrillation: Secondary | ICD-10-CM | POA: Diagnosis not present

## 2023-05-06 DIAGNOSIS — N1831 Chronic kidney disease, stage 3a: Secondary | ICD-10-CM | POA: Diagnosis not present

## 2023-05-06 DIAGNOSIS — J9601 Acute respiratory failure with hypoxia: Secondary | ICD-10-CM | POA: Diagnosis not present

## 2023-05-06 DIAGNOSIS — R918 Other nonspecific abnormal finding of lung field: Secondary | ICD-10-CM | POA: Diagnosis not present

## 2023-05-06 DIAGNOSIS — I447 Left bundle-branch block, unspecified: Secondary | ICD-10-CM | POA: Diagnosis present

## 2023-05-06 DIAGNOSIS — E871 Hypo-osmolality and hyponatremia: Secondary | ICD-10-CM | POA: Diagnosis not present

## 2023-05-06 DIAGNOSIS — R0989 Other specified symptoms and signs involving the circulatory and respiratory systems: Secondary | ICD-10-CM | POA: Diagnosis not present

## 2023-05-06 DIAGNOSIS — I129 Hypertensive chronic kidney disease with stage 1 through stage 4 chronic kidney disease, or unspecified chronic kidney disease: Secondary | ICD-10-CM | POA: Diagnosis not present

## 2023-05-06 HISTORY — PX: TEE WITHOUT CARDIOVERSION: SHX5443

## 2023-05-06 HISTORY — PX: REPLACEMENT ASCENDING AORTA: SHX6068

## 2023-05-06 LAB — POCT I-STAT 7, (LYTES, BLD GAS, ICA,H+H)
Acid-Base Excess: 0 mmol/L (ref 0.0–2.0)
Acid-base deficit: 3 mmol/L — ABNORMAL HIGH (ref 0.0–2.0)
Acid-base deficit: 3 mmol/L — ABNORMAL HIGH (ref 0.0–2.0)
Acid-base deficit: 2 mmol/L (ref 0.0–2.0)
Acid-base deficit: 5 mmol/L — ABNORMAL HIGH (ref 0.0–2.0)
Acid-base deficit: 4 mmol/L — ABNORMAL HIGH (ref 0.0–2.0)
Acid-base deficit: 5 mmol/L — ABNORMAL HIGH (ref 0.0–2.0)
Acid-base deficit: 3 mmol/L — ABNORMAL HIGH (ref 0.0–2.0)
Bicarbonate: 22.6 mmol/L (ref 20.0–28.0)
Bicarbonate: 24.8 mmol/L (ref 20.0–28.0)
Bicarbonate: 22 mmol/L (ref 20.0–28.0)
Bicarbonate: 22.8 mmol/L (ref 20.0–28.0)
Bicarbonate: 20.8 mmol/L (ref 20.0–28.0)
Bicarbonate: 21.8 mmol/L (ref 20.0–28.0)
Bicarbonate: 21.3 mmol/L (ref 20.0–28.0)
Bicarbonate: 22.4 mmol/L (ref 20.0–28.0)
Calcium, Ion: 1.18 mmol/L (ref 1.15–1.40)
Calcium, Ion: 1.12 mmol/L — ABNORMAL LOW (ref 1.15–1.40)
Calcium, Ion: 1.09 mmol/L — ABNORMAL LOW (ref 1.15–1.40)
Calcium, Ion: 1.09 mmol/L — ABNORMAL LOW (ref 1.15–1.40)
Calcium, Ion: 1.09 mmol/L — ABNORMAL LOW (ref 1.15–1.40)
Calcium, Ion: 1.12 mmol/L — ABNORMAL LOW (ref 1.15–1.40)
Calcium, Ion: 1.15 mmol/L (ref 1.15–1.40)
Calcium, Ion: 1.11 mmol/L — ABNORMAL LOW (ref 1.15–1.40)
HCT: 40 % (ref 39.0–52.0)
HCT: 35 % — ABNORMAL LOW (ref 39.0–52.0)
HCT: 33 % — ABNORMAL LOW (ref 39.0–52.0)
HCT: 33 % — ABNORMAL LOW (ref 39.0–52.0)
HCT: 35 % — ABNORMAL LOW (ref 39.0–52.0)
HCT: 37 % — ABNORMAL LOW (ref 39.0–52.0)
HCT: 44 % (ref 39.0–52.0)
HCT: 41 % (ref 39.0–52.0)
Hemoglobin: 13.6 g/dL (ref 13.0–17.0)
Hemoglobin: 11.9 g/dL — ABNORMAL LOW (ref 13.0–17.0)
Hemoglobin: 11.2 g/dL — ABNORMAL LOW (ref 13.0–17.0)
Hemoglobin: 11.2 g/dL — ABNORMAL LOW (ref 13.0–17.0)
Hemoglobin: 11.9 g/dL — ABNORMAL LOW (ref 13.0–17.0)
Hemoglobin: 12.6 g/dL — ABNORMAL LOW (ref 13.0–17.0)
Hemoglobin: 15 g/dL (ref 13.0–17.0)
Hemoglobin: 13.9 g/dL (ref 13.0–17.0)
O2 Saturation: 100 %
O2 Saturation: 100 %
O2 Saturation: 100 %
O2 Saturation: 99 %
O2 Saturation: 95 %
O2 Saturation: 97 %
O2 Saturation: 92 %
O2 Saturation: 92 %
Patient temperature: 35.5
Patient temperature: 37.3
Patient temperature: 38.1
Patient temperature: 37.9
Potassium: 3.6 mmol/L (ref 3.5–5.1)
Potassium: 4.7 mmol/L (ref 3.5–5.1)
Potassium: 5.1 mmol/L (ref 3.5–5.1)
Potassium: 4.4 mmol/L (ref 3.5–5.1)
Potassium: 4.2 mmol/L (ref 3.5–5.1)
Potassium: 4.3 mmol/L (ref 3.5–5.1)
Potassium: 4 mmol/L (ref 3.5–5.1)
Potassium: 4.1 mmol/L (ref 3.5–5.1)
Sodium: 137 mmol/L (ref 135–145)
Sodium: 136 mmol/L (ref 135–145)
Sodium: 134 mmol/L — ABNORMAL LOW (ref 135–145)
Sodium: 136 mmol/L (ref 135–145)
Sodium: 137 mmol/L (ref 135–145)
Sodium: 137 mmol/L (ref 135–145)
Sodium: 137 mmol/L (ref 135–145)
Sodium: 136 mmol/L (ref 135–145)
TCO2: 24 mmol/L (ref 22–32)
TCO2: 26 mmol/L (ref 22–32)
TCO2: 23 mmol/L (ref 22–32)
TCO2: 24 mmol/L (ref 22–32)
TCO2: 22 mmol/L (ref 22–32)
TCO2: 23 mmol/L (ref 22–32)
TCO2: 23 mmol/L (ref 22–32)
TCO2: 24 mmol/L (ref 22–32)
pCO2 arterial: 42.9 mmHg (ref 32–48)
pCO2 arterial: 40.8 mmHg (ref 32–48)
pCO2 arterial: 38.4 mmHg (ref 32–48)
pCO2 arterial: 38.7 mmHg (ref 32–48)
pCO2 arterial: 38 mmHg (ref 32–48)
pCO2 arterial: 41.5 mmHg (ref 32–48)
pCO2 arterial: 43.9 mmHg (ref 32–48)
pCO2 arterial: 43.1 mmHg (ref 32–48)
pH, Arterial: 7.329 — ABNORMAL LOW (ref 7.35–7.45)
pH, Arterial: 7.391 (ref 7.35–7.45)
pH, Arterial: 7.366 (ref 7.35–7.45)
pH, Arterial: 7.377 (ref 7.35–7.45)
pH, Arterial: 7.339 — ABNORMAL LOW (ref 7.35–7.45)
pH, Arterial: 7.33 — ABNORMAL LOW (ref 7.35–7.45)
pH, Arterial: 7.299 — ABNORMAL LOW (ref 7.35–7.45)
pH, Arterial: 7.328 — ABNORMAL LOW (ref 7.35–7.45)
pO2, Arterial: 245 mmHg — ABNORMAL HIGH (ref 83–108)
pO2, Arterial: 377 mmHg — ABNORMAL HIGH (ref 83–108)
pO2, Arterial: 358 mmHg — ABNORMAL HIGH (ref 83–108)
pO2, Arterial: 139 mmHg — ABNORMAL HIGH (ref 83–108)
pO2, Arterial: 77 mmHg — ABNORMAL LOW (ref 83–108)
pO2, Arterial: 94 mmHg (ref 83–108)
pO2, Arterial: 76 mmHg — ABNORMAL LOW (ref 83–108)
pO2, Arterial: 73 mmHg — ABNORMAL LOW (ref 83–108)

## 2023-05-06 LAB — POCT I-STAT, CHEM 8
BUN: 26 mg/dL — ABNORMAL HIGH (ref 6–20)
BUN: 27 mg/dL — ABNORMAL HIGH (ref 6–20)
BUN: 27 mg/dL — ABNORMAL HIGH (ref 6–20)
BUN: 27 mg/dL — ABNORMAL HIGH (ref 6–20)
Calcium, Ion: 1.19 mmol/L (ref 1.15–1.40)
Calcium, Ion: 1.1 mmol/L — ABNORMAL LOW (ref 1.15–1.40)
Calcium, Ion: 1.09 mmol/L — ABNORMAL LOW (ref 1.15–1.40)
Calcium, Ion: 1.07 mmol/L — ABNORMAL LOW (ref 1.15–1.40)
Chloride: 103 mmol/L (ref 98–111)
Chloride: 103 mmol/L (ref 98–111)
Chloride: 103 mmol/L (ref 98–111)
Chloride: 103 mmol/L (ref 98–111)
Creatinine, Ser: 1.2 mg/dL (ref 0.61–1.24)
Creatinine, Ser: 1.2 mg/dL (ref 0.61–1.24)
Creatinine, Ser: 1.2 mg/dL (ref 0.61–1.24)
Creatinine, Ser: 1.3 mg/dL — ABNORMAL HIGH (ref 0.61–1.24)
Glucose, Bld: 116 mg/dL — ABNORMAL HIGH (ref 70–99)
Glucose, Bld: 129 mg/dL — ABNORMAL HIGH (ref 70–99)
Glucose, Bld: 147 mg/dL — ABNORMAL HIGH (ref 70–99)
Glucose, Bld: 139 mg/dL — ABNORMAL HIGH (ref 70–99)
HCT: 42 % (ref 39.0–52.0)
HCT: 33 % — ABNORMAL LOW (ref 39.0–52.0)
HCT: 35 % — ABNORMAL LOW (ref 39.0–52.0)
HCT: 32 % — ABNORMAL LOW (ref 39.0–52.0)
Hemoglobin: 14.3 g/dL (ref 13.0–17.0)
Hemoglobin: 11.2 g/dL — ABNORMAL LOW (ref 13.0–17.0)
Hemoglobin: 11.9 g/dL — ABNORMAL LOW (ref 13.0–17.0)
Hemoglobin: 10.9 g/dL — ABNORMAL LOW (ref 13.0–17.0)
Potassium: 3.7 mmol/L (ref 3.5–5.1)
Potassium: 4.7 mmol/L (ref 3.5–5.1)
Potassium: 4.5 mmol/L (ref 3.5–5.1)
Potassium: 4.4 mmol/L (ref 3.5–5.1)
Sodium: 137 mmol/L (ref 135–145)
Sodium: 136 mmol/L (ref 135–145)
Sodium: 136 mmol/L (ref 135–145)
Sodium: 136 mmol/L (ref 135–145)
TCO2: 25 mmol/L (ref 22–32)
TCO2: 27 mmol/L (ref 22–32)
TCO2: 27 mmol/L (ref 22–32)
TCO2: 24 mmol/L (ref 22–32)

## 2023-05-06 LAB — CBC
HCT: 34 % — ABNORMAL LOW (ref 39.0–52.0)
HCT: 40.2 % (ref 39.0–52.0)
Hemoglobin: 11.4 g/dL — ABNORMAL LOW (ref 13.0–17.0)
Hemoglobin: 13.7 g/dL (ref 13.0–17.0)
MCH: 30.8 pg (ref 26.0–34.0)
MCH: 31.6 pg (ref 26.0–34.0)
MCHC: 33.5 g/dL (ref 30.0–36.0)
MCHC: 34.1 g/dL (ref 30.0–36.0)
MCV: 91.9 fL (ref 80.0–100.0)
MCV: 92.8 fL (ref 80.0–100.0)
Platelets: 171 10*3/uL (ref 150–400)
Platelets: 226 10*3/uL (ref 150–400)
RBC: 3.7 MIL/uL — ABNORMAL LOW (ref 4.22–5.81)
RBC: 4.33 MIL/uL (ref 4.22–5.81)
RDW: 13.7 % (ref 11.5–15.5)
RDW: 13.7 % (ref 11.5–15.5)
WBC: 14.1 10*3/uL — ABNORMAL HIGH (ref 4.0–10.5)
WBC: 12.9 10*3/uL — ABNORMAL HIGH (ref 4.0–10.5)
nRBC: 0 % (ref 0.0–0.2)
nRBC: 0 % (ref 0.0–0.2)

## 2023-05-06 LAB — ECHO INTRAOPERATIVE TEE
Height: 72 in
Weight: 3792 oz

## 2023-05-06 LAB — GLUCOSE, CAPILLARY
Glucose-Capillary: 116 mg/dL — ABNORMAL HIGH (ref 70–99)
Glucose-Capillary: 110 mg/dL — ABNORMAL HIGH (ref 70–99)
Glucose-Capillary: 114 mg/dL — ABNORMAL HIGH (ref 70–99)
Glucose-Capillary: 122 mg/dL — ABNORMAL HIGH (ref 70–99)
Glucose-Capillary: 124 mg/dL — ABNORMAL HIGH (ref 70–99)
Glucose-Capillary: 110 mg/dL — ABNORMAL HIGH (ref 70–99)
Glucose-Capillary: 124 mg/dL — ABNORMAL HIGH (ref 70–99)
Glucose-Capillary: 114 mg/dL — ABNORMAL HIGH (ref 70–99)
Glucose-Capillary: 133 mg/dL — ABNORMAL HIGH (ref 70–99)

## 2023-05-06 LAB — HEMOGLOBIN AND HEMATOCRIT, BLOOD
HCT: 33.6 % — ABNORMAL LOW (ref 39.0–52.0)
Hemoglobin: 11.4 g/dL — ABNORMAL LOW (ref 13.0–17.0)

## 2023-05-06 LAB — PLATELET COUNT: Platelets: 231 10*3/uL (ref 150–400)

## 2023-05-06 LAB — MAGNESIUM: Magnesium: 2.9 mg/dL — ABNORMAL HIGH (ref 1.7–2.4)

## 2023-05-06 LAB — BASIC METABOLIC PANEL
Anion gap: 13 (ref 5–15)
BUN: 22 mg/dL — ABNORMAL HIGH (ref 6–20)
CO2: 22 mmol/L (ref 22–32)
Calcium: 8.1 mg/dL — ABNORMAL LOW (ref 8.9–10.3)
Chloride: 101 mmol/L (ref 98–111)
Creatinine, Ser: 1.47 mg/dL — ABNORMAL HIGH (ref 0.61–1.24)
GFR, Estimated: 57 mL/min — ABNORMAL LOW (ref >60)
Glucose, Bld: 123 mg/dL — ABNORMAL HIGH (ref 70–99)
Potassium: 4.1 mmol/L (ref 3.5–5.1)
Sodium: 136 mmol/L (ref 135–145)

## 2023-05-06 LAB — FIBRINOGEN: Fibrinogen: 208 mg/dL — ABNORMAL LOW (ref 210–475)

## 2023-05-06 LAB — ABO/RH: ABO/RH(D): O POS

## 2023-05-06 LAB — PROTIME-INR
INR: 1.6 — ABNORMAL HIGH (ref 0.8–1.2)
Prothrombin Time: 19.2 seconds — ABNORMAL HIGH (ref 11.4–15.2)

## 2023-05-06 LAB — APTT: aPTT: 34 seconds (ref 24–36)

## 2023-05-06 SURGERY — REPLACEMENT, AORTA, ASCENDING
Anesthesia: General | Site: Chest

## 2023-05-06 MED ORDER — LACTATED RINGERS IV SOLN: INTRAVENOUS | Status: DC

## 2023-05-06 MED ORDER — ORAL CARE MOUTH RINSE: 15.0000 mL | OROMUCOSAL | Status: DC | PRN

## 2023-05-06 MED ORDER — FENTANYL CITRATE (PF) 250 MCG/5ML IJ SOLN
INTRAMUSCULAR | Status: DC | PRN
  Administered 2023-05-06 (×2): 100 ug via INTRAVENOUS
  Administered 2023-05-06: 150 ug via INTRAVENOUS
  Administered 2023-05-06: 50 ug via INTRAVENOUS
  Administered 2023-05-06 (×2): 100 ug via INTRAVENOUS
  Administered 2023-05-06: 50 ug via INTRAVENOUS
  Administered 2023-05-06 (×2): 100 ug via INTRAVENOUS
  Administered 2023-05-06: 50 ug via INTRAVENOUS
  Administered 2023-05-06: 100 ug via INTRAVENOUS
  Administered 2023-05-06 (×3): 50 ug via INTRAVENOUS
  Administered 2023-05-06: 100 ug via INTRAVENOUS

## 2023-05-06 MED ORDER — SODIUM CHLORIDE 0.45 % IV SOLN: INTRAVENOUS | Status: DC | PRN

## 2023-05-06 MED ORDER — PROTAMINE SULFATE 10 MG/ML IV SOLN
INTRAVENOUS | Status: AC
  Filled 2023-05-06: qty 5

## 2023-05-06 MED ORDER — IPRATROPIUM-ALBUTEROL 0.5-2.5 (3) MG/3ML IN SOLN: 3.0000 mL | Freq: Once | RESPIRATORY_TRACT | Status: AC

## 2023-05-06 MED ORDER — SODIUM CHLORIDE (PF) 0.9 % IJ SOLN: OROMUCOSAL | Status: DC | PRN

## 2023-05-06 MED ORDER — OXYCODONE HCL 5 MG PO TABS
5.0000 mg | ORAL_TABLET | ORAL | Status: DC | PRN
  Administered 2023-05-06 – 2023-05-09 (×12): 10 mg via ORAL
  Administered 2023-05-09 (×2): 5 mg via ORAL
  Administered 2023-05-10 (×3): 10 mg via ORAL
  Administered 2023-05-11 (×2): 5 mg via ORAL
  Administered 2023-05-12: 10 mg via ORAL
  Administered 2023-05-12: 5 mg via ORAL
  Administered 2023-05-12: 10 mg via ORAL
  Administered 2023-05-12 – 2023-05-13 (×2): 5 mg via ORAL
  Filled 2023-05-06: qty 2
  Filled 2023-05-06: qty 1
  Filled 2023-05-06: qty 2
  Filled 2023-05-06 (×2): qty 1
  Filled 2023-05-06 (×5): qty 2
  Filled 2023-05-06: qty 1
  Filled 2023-05-06 (×5): qty 2
  Filled 2023-05-06 (×2): qty 1
  Filled 2023-05-06 (×2): qty 2
  Filled 2023-05-06: qty 1
  Filled 2023-05-06 (×4): qty 2

## 2023-05-06 MED ORDER — POTASSIUM CHLORIDE 10 MEQ/50ML IV SOLN: 10.0000 meq | INTRAVENOUS | Status: AC

## 2023-05-06 MED ORDER — ~~LOC~~ CARDIAC SURGERY, PATIENT & FAMILY EDUCATION
Freq: Once | Status: DC
  Filled 2023-05-06: qty 1

## 2023-05-06 MED ORDER — MIDAZOLAM HCL 2 MG/2ML IJ SOLN: 2.0000 mg | INTRAMUSCULAR | Status: DC | PRN

## 2023-05-06 MED ORDER — MIDAZOLAM HCL (PF) 5 MG/ML IJ SOLN
INTRAMUSCULAR | Status: DC | PRN
  Administered 2023-05-06 (×2): 2 mg via INTRAVENOUS
  Administered 2023-05-06: 1 mg via INTRAVENOUS
  Administered 2023-05-06: 4 mg via INTRAVENOUS
  Administered 2023-05-06: 1 mg via INTRAVENOUS

## 2023-05-06 MED ORDER — PANTOPRAZOLE SODIUM 40 MG IV SOLR
40.0000 mg | Freq: Every day | INTRAVENOUS | Status: AC
  Administered 2023-05-06 – 2023-05-07 (×2): 40 mg via INTRAVENOUS
  Filled 2023-05-06 (×2): qty 10

## 2023-05-06 MED ORDER — ACETAMINOPHEN 500 MG PO TABS
1000.0000 mg | ORAL_TABLET | Freq: Once | ORAL | Status: AC
  Administered 2023-05-06: 1000 mg via ORAL
  Filled 2023-05-06: qty 2

## 2023-05-06 MED ORDER — VANCOMYCIN HCL IN DEXTROSE 1-5 GM/200ML-% IV SOLN
1000.0000 mg | Freq: Once | INTRAVENOUS | Status: AC
  Administered 2023-05-06: 1000 mg via INTRAVENOUS
  Filled 2023-05-06: qty 200

## 2023-05-06 MED ORDER — LIDOCAINE 5 % EX PTCH: 1.0000 | MEDICATED_PATCH | Freq: Every day | CUTANEOUS | Status: DC

## 2023-05-06 MED ORDER — HEPARIN SODIUM (PORCINE) 1000 UNIT/ML IJ SOLN
INTRAMUSCULAR | Status: AC
  Filled 2023-05-06: qty 1

## 2023-05-06 MED ORDER — METOPROLOL TARTRATE 12.5 MG HALF TABLET: 12.5000 mg | ORAL_TABLET | Freq: Once | ORAL | Status: DC

## 2023-05-06 MED ORDER — LACTATED RINGERS IV SOLN: INTRAVENOUS | Status: DC | PRN

## 2023-05-06 MED ORDER — ACETAMINOPHEN 500 MG PO TABS
1000.0000 mg | ORAL_TABLET | Freq: Four times a day (QID) | ORAL | Status: AC
  Administered 2023-05-06 – 2023-05-11 (×20): 1000 mg via ORAL
  Filled 2023-05-06 (×20): qty 2

## 2023-05-06 MED ORDER — NICARDIPINE HCL IN NACL 20-0.86 MG/200ML-% IV SOLN
0.0000 mg/h | INTRAVENOUS | Status: DC
  Administered 2023-05-06: 5 mg/h via INTRAVENOUS
  Filled 2023-05-06: qty 200

## 2023-05-06 MED ORDER — SODIUM CHLORIDE 0.9 % IV SOLN: 250.0000 mL | INTRAVENOUS | Status: DC

## 2023-05-06 MED ORDER — FUROSEMIDE 10 MG/ML IJ SOLN
40.0000 mg | Freq: Once | INTRAMUSCULAR | Status: AC
  Administered 2023-05-06: 40 mg via INTRAVENOUS
  Filled 2023-05-06: qty 4

## 2023-05-06 MED ORDER — METOCLOPRAMIDE HCL 5 MG/ML IJ SOLN
10.0000 mg | Freq: Four times a day (QID) | INTRAMUSCULAR | Status: AC
  Administered 2023-05-06 – 2023-05-07 (×6): 10 mg via INTRAVENOUS
  Filled 2023-05-06 (×6): qty 2

## 2023-05-06 MED ORDER — DEXMEDETOMIDINE HCL IN NACL 400 MCG/100ML IV SOLN: 0.0000 ug/kg/h | INTRAVENOUS | Status: DC

## 2023-05-06 MED ORDER — PROPOFOL 10 MG/ML IV BOLUS
INTRAVENOUS | Status: AC
  Filled 2023-05-06: qty 20

## 2023-05-06 MED ORDER — HEPARIN SODIUM (PORCINE) 1000 UNIT/ML IJ SOLN
INTRAMUSCULAR | Status: DC | PRN
  Administered 2023-05-06: 38000 [IU] via INTRAVENOUS

## 2023-05-06 MED ORDER — FENTANYL CITRATE (PF) 250 MCG/5ML IJ SOLN
INTRAMUSCULAR | Status: AC
  Filled 2023-05-06: qty 5

## 2023-05-06 MED ORDER — PHENYLEPHRINE 80 MCG/ML (10ML) SYRINGE FOR IV PUSH (FOR BLOOD PRESSURE SUPPORT)
PREFILLED_SYRINGE | INTRAVENOUS | Status: AC
  Filled 2023-05-06: qty 10

## 2023-05-06 MED ORDER — CHLORHEXIDINE GLUCONATE CLOTH 2 % EX PADS
6.0000 | MEDICATED_PAD | Freq: Every day | CUTANEOUS | Status: DC
  Administered 2023-05-08 – 2023-05-12 (×5): 6 via TOPICAL

## 2023-05-06 MED ORDER — ONDANSETRON HCL 4 MG/2ML IJ SOLN
4.0000 mg | Freq: Four times a day (QID) | INTRAMUSCULAR | Status: DC | PRN
  Administered 2023-05-08: 4 mg via INTRAVENOUS
  Filled 2023-05-06: qty 2

## 2023-05-06 MED ORDER — HEMOSTATIC AGENTS (NO CHARGE) OPTIME
TOPICAL | Status: DC | PRN
  Administered 2023-05-06: 1 via TOPICAL

## 2023-05-06 MED ORDER — CHLORHEXIDINE GLUCONATE 4 % EX SOLN: 30.0000 mL | CUTANEOUS | Status: DC

## 2023-05-06 MED ORDER — ACETAMINOPHEN 160 MG/5ML PO SOLN: 650.0000 mg | Freq: Once | ORAL | Status: DC

## 2023-05-06 MED ORDER — METOPROLOL TARTRATE 5 MG/5ML IV SOLN: 2.5000 mg | INTRAVENOUS | Status: DC | PRN

## 2023-05-06 MED ORDER — METOPROLOL TARTRATE 25 MG/10 ML ORAL SUSPENSION: 12.5000 mg | Freq: Two times a day (BID) | ORAL | Status: DC

## 2023-05-06 MED ORDER — 0.9 % SODIUM CHLORIDE (POUR BTL) OPTIME
TOPICAL | Status: DC | PRN
  Administered 2023-05-06: 6000 mL

## 2023-05-06 MED ORDER — DOCUSATE SODIUM 100 MG PO CAPS
200.0000 mg | ORAL_CAPSULE | Freq: Every day | ORAL | Status: DC
  Administered 2023-05-07 – 2023-05-14 (×6): 200 mg via ORAL
  Filled 2023-05-06 (×7): qty 2

## 2023-05-06 MED ORDER — TRAMADOL HCL 50 MG PO TABS
50.0000 mg | ORAL_TABLET | ORAL | Status: DC | PRN
  Administered 2023-05-07: 100 mg via ORAL
  Filled 2023-05-06 (×2): qty 2

## 2023-05-06 MED ORDER — LEVALBUTEROL HCL 0.63 MG/3ML IN NEBU
INHALATION_SOLUTION | RESPIRATORY_TRACT | Status: AC
  Administered 2023-05-06: 0.63 mg
  Filled 2023-05-06: qty 3

## 2023-05-06 MED ORDER — GABAPENTIN 100 MG PO CAPS
100.0000 mg | ORAL_CAPSULE | Freq: Three times a day (TID) | ORAL | Status: DC | PRN
  Administered 2023-05-07: 100 mg via ORAL
  Filled 2023-05-06 (×2): qty 1

## 2023-05-06 MED ORDER — PANTOPRAZOLE SODIUM 40 MG PO TBEC
40.0000 mg | DELAYED_RELEASE_TABLET | Freq: Every day | ORAL | Status: DC
  Administered 2023-05-08 – 2023-05-14 (×7): 40 mg via ORAL
  Filled 2023-05-06 (×7): qty 1

## 2023-05-06 MED ORDER — SODIUM CHLORIDE 0.9% FLUSH: 10.0000 mL | INTRAVENOUS | Status: DC | PRN

## 2023-05-06 MED ORDER — PROTAMINE SULFATE 10 MG/ML IV SOLN
INTRAVENOUS | Status: AC
  Filled 2023-05-06: qty 25

## 2023-05-06 MED ORDER — METOPROLOL TARTRATE 12.5 MG HALF TABLET
12.5000 mg | ORAL_TABLET | Freq: Two times a day (BID) | ORAL | Status: DC
  Administered 2023-05-07: 12.5 mg via ORAL
  Filled 2023-05-06: qty 1

## 2023-05-06 MED ORDER — ALBUMIN HUMAN 5 % IV SOLN: INTRAVENOUS | Status: DC | PRN

## 2023-05-06 MED ORDER — BISACODYL 10 MG RE SUPP: 10.0000 mg | Freq: Every day | RECTAL | Status: DC

## 2023-05-06 MED ORDER — ROCURONIUM BROMIDE 10 MG/ML (PF) SYRINGE
PREFILLED_SYRINGE | INTRAVENOUS | Status: AC
  Filled 2023-05-06: qty 30

## 2023-05-06 MED ORDER — ASPIRIN 325 MG PO TBEC
325.0000 mg | DELAYED_RELEASE_TABLET | Freq: Every day | ORAL | Status: DC
  Administered 2023-05-07: 325 mg via ORAL
  Filled 2023-05-06: qty 1

## 2023-05-06 MED ORDER — INSULIN REGULAR(HUMAN) IN NACL 100-0.9 UT/100ML-% IV SOLN: INTRAVENOUS | Status: DC

## 2023-05-06 MED ORDER — ASPIRIN 81 MG PO CHEW: 324.0000 mg | CHEWABLE_TABLET | Freq: Every day | ORAL | Status: DC

## 2023-05-06 MED ORDER — MAGNESIUM SULFATE 4 GM/100ML IV SOLN
4.0000 g | Freq: Once | INTRAVENOUS | Status: AC
  Administered 2023-05-06: 4 g via INTRAVENOUS

## 2023-05-06 MED ORDER — ROCURONIUM BROMIDE 10 MG/ML (PF) SYRINGE
PREFILLED_SYRINGE | INTRAVENOUS | Status: DC | PRN
  Administered 2023-05-06: 100 mg via INTRAVENOUS
  Administered 2023-05-06: 50 mg via INTRAVENOUS
  Administered 2023-05-06: 30 mg via INTRAVENOUS
  Administered 2023-05-06: 50 mg via INTRAVENOUS

## 2023-05-06 MED ORDER — CHLORHEXIDINE GLUCONATE 0.12 % MT SOLN
15.0000 mL | OROMUCOSAL | Status: AC
  Administered 2023-05-06: 15 mL via OROMUCOSAL
  Filled 2023-05-06: qty 15

## 2023-05-06 MED ORDER — BISACODYL 5 MG PO TBEC
10.0000 mg | DELAYED_RELEASE_TABLET | Freq: Every day | ORAL | Status: DC
  Administered 2023-05-07 – 2023-05-11 (×5): 10 mg via ORAL
  Filled 2023-05-06 (×7): qty 2

## 2023-05-06 MED ORDER — SODIUM CHLORIDE 0.9% FLUSH
10.0000 mL | Freq: Two times a day (BID) | INTRAVENOUS | Status: DC
  Administered 2023-05-06 – 2023-05-08 (×5): 10 mL

## 2023-05-06 MED ORDER — SODIUM CHLORIDE 0.9% FLUSH: 3.0000 mL | INTRAVENOUS | Status: DC | PRN

## 2023-05-06 MED ORDER — HEPARIN SODIUM (PORCINE) 1000 UNIT/ML IJ SOLN
INTRAMUSCULAR | Status: AC
  Filled 2023-05-06: qty 10

## 2023-05-06 MED ORDER — CEFAZOLIN SODIUM-DEXTROSE 2-4 GM/100ML-% IV SOLN
2.0000 g | Freq: Three times a day (TID) | INTRAVENOUS | Status: AC
  Administered 2023-05-06 – 2023-05-08 (×6): 2 g via INTRAVENOUS
  Filled 2023-05-06 (×5): qty 100

## 2023-05-06 MED ORDER — MORPHINE SULFATE (PF) 2 MG/ML IV SOLN
1.0000 mg | INTRAVENOUS | Status: DC | PRN
  Administered 2023-05-06 – 2023-05-08 (×5): 2 mg via INTRAVENOUS
  Filled 2023-05-06 (×5): qty 1

## 2023-05-06 MED ORDER — NITROGLYCERIN IN D5W 200-5 MCG/ML-% IV SOLN: 0.0000 ug/min | INTRAVENOUS | Status: DC

## 2023-05-06 MED ORDER — ACETAMINOPHEN 160 MG/5ML PO SOLN: 1000.0000 mg | Freq: Four times a day (QID) | ORAL | Status: DC

## 2023-05-06 MED ORDER — ALBUMIN HUMAN 5 % IV SOLN
250.0000 mL | INTRAVENOUS | Status: DC | PRN
  Administered 2023-05-06 (×3): 12.5 g via INTRAVENOUS
  Filled 2023-05-06: qty 250

## 2023-05-06 MED ORDER — SODIUM CHLORIDE 0.9 % IV SOLN: INTRAVENOUS | Status: DC

## 2023-05-06 MED ORDER — PHENYLEPHRINE HCL-NACL 20-0.9 MG/250ML-% IV SOLN: 0.0000 ug/min | INTRAVENOUS | Status: DC

## 2023-05-06 MED ORDER — CHLORHEXIDINE GLUCONATE 0.12 % MT SOLN
15.0000 mL | Freq: Once | OROMUCOSAL | Status: DC
  Filled 2023-05-06: qty 15

## 2023-05-06 MED ORDER — SODIUM CHLORIDE 0.9% FLUSH
3.0000 mL | Freq: Two times a day (BID) | INTRAVENOUS | Status: DC
  Administered 2023-05-07 – 2023-05-08 (×3): 3 mL via INTRAVENOUS

## 2023-05-06 MED ORDER — ASPIRIN 81 MG PO CHEW
324.0000 mg | CHEWABLE_TABLET | Freq: Once | ORAL | Status: AC
  Administered 2023-05-06: 324 mg via ORAL
  Filled 2023-05-06: qty 4

## 2023-05-06 MED ORDER — LIDOCAINE 5 % EX PTCH
1.0000 | MEDICATED_PATCH | Freq: Every day | CUTANEOUS | Status: DC
  Administered 2023-05-06 – 2023-05-12 (×5): 1 via TRANSDERMAL
  Filled 2023-05-06 (×6): qty 1

## 2023-05-06 MED ORDER — PROPOFOL 10 MG/ML IV BOLUS
INTRAVENOUS | Status: DC | PRN
Start: 2023-05-06 — End: 2023-05-06
  Administered 2023-05-06: 200 mg via INTRAVENOUS

## 2023-05-06 MED ORDER — MIDAZOLAM HCL (PF) 10 MG/2ML IJ SOLN
INTRAMUSCULAR | Status: AC
  Filled 2023-05-06: qty 2

## 2023-05-06 MED ORDER — CHLORHEXIDINE GLUCONATE CLOTH 2 % EX PADS
6.0000 | MEDICATED_PAD | Freq: Every day | CUTANEOUS | Status: DC
  Administered 2023-05-06: 6 via TOPICAL

## 2023-05-06 MED ORDER — ORAL CARE MOUTH RINSE
15.0000 mL | OROMUCOSAL | Status: DC
  Administered 2023-05-06 (×2): 15 mL via OROMUCOSAL

## 2023-05-06 MED ORDER — PLASMA-LYTE A IV SOLN: INTRAVENOUS | Status: DC | PRN

## 2023-05-06 MED ORDER — DEXTROSE 50 % IV SOLN: 0.0000 mL | INTRAVENOUS | Status: DC | PRN

## 2023-05-06 MED ORDER — NOREPINEPHRINE 4 MG/250ML-% IV SOLN: 0.0000 ug/min | INTRAVENOUS | Status: DC

## 2023-05-06 MED ORDER — PROTAMINE SULFATE 10 MG/ML IV SOLN
INTRAVENOUS | Status: DC | PRN
  Administered 2023-05-06: 340 mg via INTRAVENOUS
  Administered 2023-05-06: 10 mg via INTRAVENOUS

## 2023-05-06 SURGICAL SUPPLY — 91 items
ADAPTER CARDIO PERF ANTE/RETRO (ADAPTER) ×2 IMPLANT
ADPR PRFSN 84XANTGRD RTRGD (ADAPTER) ×2
APL SWBSTK 6 STRL LF DISP (MISCELLANEOUS) ×2
APPLICATOR COTTON TIP 6 STRL (MISCELLANEOUS) IMPLANT
APPLICATOR COTTON TIP 6IN STRL (MISCELLANEOUS) ×2
BAG DECANTER FOR FLEXI CONT (MISCELLANEOUS) ×2 IMPLANT
BLADE CLIPPER SURG (BLADE) ×2 IMPLANT
BLADE STERNUM SYSTEM 6 (BLADE) ×2 IMPLANT
BLADE SURG 15 STRL LF DISP TIS (BLADE) ×2 IMPLANT
BLADE SURG 15 STRL SS (BLADE) ×2
CANISTER SUCT 3000ML PPV (MISCELLANEOUS) ×2 IMPLANT
CANNULA GUNDRY RCSP 15FR (MISCELLANEOUS) ×2 IMPLANT
CANNULA MC2 2 STG 29/37 CONN (CANNULA) IMPLANT
CANNULA NON VENT 20FR 12 (CANNULA) ×2 IMPLANT
CANNULA NON VENT 22FR 12 (CANNULA) IMPLANT
CANNULA SUMP PERICARDIAL (CANNULA) IMPLANT
CATH HEART VENT LEFT (CATHETERS) ×2 IMPLANT
CATH ROBINSON RED A/P 18FR (CATHETERS) ×6 IMPLANT
CATH/SQUID NICHOLS JEHLE COR (CATHETERS) IMPLANT
CAUTERY HI TEMP FINE TIP 2 (MISCELLANEOUS) IMPLANT
CNTNR URN SCR LID CUP LEK RST (MISCELLANEOUS) ×2 IMPLANT
CONT SPEC 4OZ STRL OR WHT (MISCELLANEOUS) ×4
CONTAINER PROTECT SURGISLUSH (MISCELLANEOUS) ×4 IMPLANT
COVER SURGICAL LIGHT HANDLE (MISCELLANEOUS) ×2 IMPLANT
DEVICE SUT CK QUICK LOAD MINI (Prosthesis & Implant Heart) IMPLANT
DRAIN CHANNEL 19F RND (DRAIN) ×2 IMPLANT
DRAIN CONNECTOR BLAKE 1:1 (MISCELLANEOUS) IMPLANT
DRAPE CARDIOVASCULAR INCISE (DRAPES) ×2
DRAPE SRG 135X102X78XABS (DRAPES) ×2 IMPLANT
DRAPE WARM FLUID 44X44 (DRAPES) ×2 IMPLANT
DRSG AQUACEL AG ADV 3.5X10 (GAUZE/BANDAGES/DRESSINGS) IMPLANT
ELECT BLADE 4.0 EZ CLEAN MEGAD (MISCELLANEOUS) ×2
ELECT CAUTERY BLADE 6.4 (BLADE) ×2 IMPLANT
ELECT REM PT RETURN 9FT ADLT (ELECTROSURGICAL) ×4
ELECTRODE BLDE 4.0 EZ CLN MEGD (MISCELLANEOUS) IMPLANT
ELECTRODE REM PT RTRN 9FT ADLT (ELECTROSURGICAL) ×4 IMPLANT
FELT TEFLON 1X6 (MISCELLANEOUS) ×4 IMPLANT
GAUZE SPONGE 4X4 12PLY STRL (GAUZE/BANDAGES/DRESSINGS) ×2 IMPLANT
GAUZE SPONGE 4X4 12PLY STRL LF (GAUZE/BANDAGES/DRESSINGS) IMPLANT
GLOVE BIO SURGEON STRL SZ 6 (GLOVE) IMPLANT
GLOVE BIO SURGEON STRL SZ 6.5 (GLOVE) IMPLANT
GLOVE BIO SURGEON STRL SZ7 (GLOVE) ×6 IMPLANT
GLOVE BIO SURGEON STRL SZ7.5 (GLOVE) IMPLANT
GLOVE BIOGEL PI IND STRL 6 (GLOVE) IMPLANT
GLOVE BIOGEL PI IND STRL 6.5 (GLOVE) IMPLANT
GLOVE BIOGEL PI IND STRL 7.5 (GLOVE) IMPLANT
GOWN STRL REUS W/ TWL LRG LVL3 (GOWN DISPOSABLE) ×8 IMPLANT
GOWN STRL REUS W/ TWL XL LVL3 (GOWN DISPOSABLE) ×4 IMPLANT
GOWN STRL REUS W/TWL LRG LVL3 (GOWN DISPOSABLE) ×8
GOWN STRL REUS W/TWL XL LVL3 (GOWN DISPOSABLE) ×4
HEMOSTAT POWDER SURGIFOAM 1G (HEMOSTASIS) ×4 IMPLANT
HEMOSTAT SURGICEL 2X14 (HEMOSTASIS) ×2 IMPLANT
INSERT FOGARTY XLG (MISCELLANEOUS) IMPLANT
INSERT SUTURE HOLDER (MISCELLANEOUS) ×2 IMPLANT
KIT BASIN OR (CUSTOM PROCEDURE TRAY) ×2 IMPLANT
KIT SUCTION CATH 14FR (SUCTIONS) ×2 IMPLANT
KIT SUT CK MINI COMBO 4X17 (Prosthesis & Implant Heart) IMPLANT
KIT TURNOVER KIT B (KITS) ×2 IMPLANT
LEAD PACING MYOCARDI (MISCELLANEOUS) IMPLANT
LINE VENT (MISCELLANEOUS) IMPLANT
NS IRRIG 1000ML POUR BTL (IV SOLUTION) ×12 IMPLANT
ORGANIZER SUTURE GABBAY-FRATER (MISCELLANEOUS) ×2 IMPLANT
PACK E OPEN HEART (SUTURE) ×2 IMPLANT
PACK OPEN HEART (CUSTOM PROCEDURE TRAY) ×2 IMPLANT
PAD ARMBOARD 7.5X6 YLW CONV (MISCELLANEOUS) ×4 IMPLANT
POSITIONER HEAD DONUT 9IN (MISCELLANEOUS) ×2 IMPLANT
SEALANT SURG COSEAL 8ML (VASCULAR PRODUCTS) IMPLANT
SET MPS 3-ND DEL (MISCELLANEOUS) IMPLANT
SUT BONE WAX W31G (SUTURE) ×2 IMPLANT
SUT EB EXC GRN/WHT 2-0 V-5 (SUTURE) ×4 IMPLANT
SUT ETHIBOND 2 0 SH (SUTURE) ×2
SUT ETHIBOND 2 0 SH 36X2 (SUTURE) IMPLANT
SUT PROLENE 3 0 SH DA (SUTURE) IMPLANT
SUT PROLENE 3 0 SH1 36 (SUTURE) ×2 IMPLANT
SUT PROLENE 4 0 RB 1 (SUTURE) ×26
SUT PROLENE 4-0 RB1 .5 CRCL 36 (SUTURE) ×6 IMPLANT
SUT PROLENE 5 0 C 1 36 (SUTURE) IMPLANT
SUT STEEL 6MS V (SUTURE) IMPLANT
SYSTEM SAHARA CHEST DRAIN ATS (WOUND CARE) ×2 IMPLANT
TAPE CLOTH SURG 4X10 WHT LF (GAUZE/BANDAGES/DRESSINGS) IMPLANT
TAPE PAPER 2X10 WHT MICROPORE (GAUZE/BANDAGES/DRESSINGS) IMPLANT
TOWEL GREEN STERILE (TOWEL DISPOSABLE) ×2 IMPLANT
TOWEL GREEN STERILE FF (TOWEL DISPOSABLE) ×2 IMPLANT
TRAY FOLEY SLVR 16FR TEMP STAT (SET/KITS/TRAYS/PACK) ×2 IMPLANT
TUBE SUCT INTRACARD DLP 20F (MISCELLANEOUS) IMPLANT
UNDERPAD 30X36 HEAVY ABSORB (UNDERPADS AND DIAPERS) ×2 IMPLANT
VALVE AOR 29XLF MSTR (Prosthesis & Implant Heart) IMPLANT
VALVE AORTIC COND (Prosthesis & Implant Heart) ×2 IMPLANT
VENT LEFT HEART 12002 (CATHETERS) ×2
WATER STERILE IRR 1000ML POUR (IV SOLUTION) ×4 IMPLANT
YANKAUER SUCT BULB TIP NO VENT (SUCTIONS) IMPLANT

## 2023-05-06 NOTE — Anesthesia Postprocedure Evaluation (Signed)
Anesthesia Post Note  Patient: NEHEMIAH MCFARREN  Procedure(s) Performed: BENTALL PROCEDURE USING SJM AORTIC VALVED GRAFT SIZE (Chest) TRANSESOPHAGEAL ECHOCARDIOGRAM     Patient location during evaluation: ICU Anesthesia Type: General Level of consciousness: sedated and patient remains intubated per anesthesia plan Pain management: pain level controlled Vital Signs Assessment: post-procedure vital signs reviewed and stable Respiratory status: patient remains intubated per anesthesia plan Cardiovascular status: stable Postop Assessment: no apparent nausea or vomiting Anesthetic complications: no   No notable events documented.  Last Vitals:  Vitals:   05/06/23 0730 05/06/23 1250  BP:  101/65  Pulse: 67 73  Resp:  16  Temp:    SpO2: 99% 98%    Last Pain:  Vitals:   05/06/23 0618  TempSrc:   PainSc: 0-No pain                 [name]

## 2023-05-06 NOTE — Progress Notes (Signed)
PCCM interval progress note:  Pt was extubated to 4L but has required increasing oxygen to maintain sats and is up to 45L 100% HFNC.  He is not in distress, has diminished air entry in the bilateral bases and feels short of breath.  Repeat CXR unchanged with bilateral atelectasis vs infiltrate.  He is also hypertensive and started on cardene gtt.  D/w Dr. Denese Killings and notified Dr. Cliffton Asters.  -continue HFNC, give lasix 40mg  x1 -treat pain, add lidocaine patch and prn gabapentin -continue IS -Duoneb -may need bipap overnight if worsens   Darcella Gasman Maan Zarcone, PA-C [facility] See Amion for pager If no response to pager , please call [phone] until 7pm After 7:00 pm call Elink  [phone]

## 2023-05-06 NOTE — Anesthesia Procedure Notes (Signed)
Central Venous Catheter Insertion Performed by: [name], MD, anesthesiologist Start/End7/18/2024 6:49 AM, 05/06/2023 6:58 AM Patient location: Pre-op. Preanesthetic checklist: patient identified, IV checked, risks and benefits discussed, surgical consent, monitors and equipment checked, pre-op evaluation, timeout performed and anesthesia consent Position: Trendelenburg Lidocaine 1% used for infiltration and patient sedated Hand hygiene performed , maximum sterile barriers used  and Seldinger technique used Catheter size: 8.5 Fr Central line was placed.MAC introducer Procedure performed using ultrasound guided technique. Ultrasound Notes:anatomy identified, needle tip was noted to be adjacent to the nerve/plexus identified, no ultrasound evidence of intravascular and/or intraneural injection and image(s) printed for medical record Attempts: 1 Following insertion, line sutured, dressing applied and Biopatch. Post procedure assessment: blood return through all ports, no air and free fluid flow  Patient tolerated the procedure well with no immediate complications.

## 2023-05-06 NOTE — Op Note (Signed)
[address]       [place] [address]             [phone]                                           05/06/2023 Patient:  Timothy Browning Pre-Op Dx: Aortic root aneurysm Post-op Dx: Same Procedure: Bental procedure with 29 mm Saint Jude aortic valve conduit. Reimplantation of right and left coronary arteries.   Surgeon and Role:      * Whit Bruni, Eliezer Lofts, MD - Primary    *P. 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  He also has a history of some palpitations, which is currently being followed by cardiology.  We discussed the risks and benefits of with valve sparing root replacement given the size, and his concern for dissection.  He is agreeable to proceed.  His repeat echo is clean, and he has had cardiac clearance.     Findings: Aortic root aneurysm.  There were multiple fenestrations in all 3 aortic valve leaflets.  We elected to proceed with a Bentall procedure.  Operative Technique: All invasive lines were placed in pre-op holding.  After the risks, benefits and alternatives were thoroughly discussed, the patient was brought to the operative theatre.  Anesthesia was induced, and the patient was prepped and draped in normal sterile fashion.  An appropriate surgical pause was performed, and pre-operative antibiotics were dosed accordingly.  We began with an incision over the chest for the sternotomy.  This was carried down with bovie cautery, and the sternum was divided with a reciprocating saw.  Meticulous hemostasis was obtained.  The patient was systemically heparinized.   The sternal retractor was placed.  The pericardium was divided in the midline and fashioned into a cradle with pericardial stitches.   After we  confirmed an appropriate ACT, the ascending aorta was cannulated in standard fashion.  The right atrial appendage was used for venous cannulation site.  Cardiopulmonary bypass was initiated and we began to cool the patient to 32 degrees. The cross clamp was applied, and a dose of anterograde cardioplegia was given with good arrest of the heart.  Our aortotomy was made and we focused our attention to the aortic valve.  There were multiple fenestrations thus a valve sparing root would not be possible.  The aortic valve leaflets were resected.  Created on the right and left coronary buttons.  All aneurysmal tissue was resected.    Next we sized the annulus to a 29 mm Saint Jude valve conduit.  We then placed pledgeted mattress sutures along the aortic valve annulus to be eager to the annulus to the valve.  The sutures were then passed through the sewing cuff of the valve conduit and seated in place with core knots.  We then created our holes in the valve conduit and showed our left then right coronary buttons with 5-0 Prolene suture.  We then began to rewarm, and created our distal anastomosis with ascending aorta.  A re-animation dose of cardioplegia was given.  After de-airing the heart, the aortic cross clamp was removed.  We checked our valve function, and for air using the TEE.  Once we were satisfying, we separated from cardiopulmonary bypass without event.    The heparin was reversed with protamine, and hemostasis was obtained.  Chest tubes and wires were placed, and the sternum was re-approximated with with sternal wires.  The soft tissue and skin were re-approximated wth absorbable suture.    The patient tolerated the procedure without any immediate complications, and was transferred to the ICU in guarded condition.  [place] Liz Keane Scrape

## 2023-05-06 NOTE — Transfer of Care (Signed)
Immediate Anesthesia Transfer of Care Note  Patient: Timothy Browning  Procedure(s) Performed: BENTALL PROCEDURE USING SJM AORTIC VALVED GRAFT SIZE (Chest) TRANSESOPHAGEAL ECHOCARDIOGRAM  Patient Location: SICU  Anesthesia Type:General  Level of Consciousness: Patient remains intubated per anesthesia plan  Airway & Oxygen Therapy: Patient remains intubated per anesthesia plan  Post-op Assessment: Report given to RN and Post -op Vital signs reviewed and stable  Post vital signs: Reviewed and stable  Last Vitals:  Vitals Value Taken Time  BP    Temp    Pulse    Resp    SpO2      Last Pain:  Vitals:   05/06/23 0618  TempSrc:   PainSc: 0-No pain         Complications: No notable events documented.

## 2023-05-06 NOTE — Progress Notes (Signed)
  Echocardiogram Echocardiogram Transesophageal has been performed.  Delcie Roch 05/06/2023, 9:49 AM

## 2023-05-06 NOTE — Interval H&P Note (Signed)
History and Physical Interval Note:  05/06/2023 7:25 AM  Timothy Browning  has presented today for surgery, with the diagnosis of TAA.  The various methods of treatment have been discussed with the patient and family. After consideration of risks, benefits and other options for treatment, the patient has consented to  Procedure(s): VALVE SPARING ROOT REPLACEMENT (N/A) TRANSESOPHAGEAL ECHOCARDIOGRAM (N/A) as a surgical intervention.  The patient's history has been reviewed, patient examined, no change in status, stable for surgery.  I have reviewed the patient's chart and labs.  Questions were answered to the patient's satisfaction.     Cheryl Chay Keane Scrape

## 2023-05-06 NOTE — Anesthesia Procedure Notes (Signed)
Procedure Name: Intubation Date/Time: 05/06/2023 7:57 AM  Performed by: Dairl Ponder, CRNAPre-anesthesia Checklist: Patient identified, Emergency Drugs available, Suction available and Patient being monitored Patient Re-evaluated:Patient Re-evaluated prior to induction Oxygen Delivery Method: Circle System Utilized Preoxygenation: Pre-oxygenation with 100% oxygen Induction Type: IV induction Ventilation: Mask ventilation without difficulty Laryngoscope Size: Mac and 4 Grade View: Grade III Tube type: Oral Tube size: 8.0 mm Number of attempts: 1 Airway Equipment and Method: Stylet and Oral airway Placement Confirmation: positive ETCO2 and breath sounds checked- equal and bilateral Secured at: 24 cm Tube secured with: Tape Dental Injury: Teeth and Oropharynx as per pre-operative assessment

## 2023-05-06 NOTE — Anesthesia Procedure Notes (Signed)
Arterial Line Insertion Performed by: Dairl Ponder, CRNA  Left, radial was placed  Attempts: 1 Procedure performed without using ultrasound guided technique.

## 2023-05-06 NOTE — Procedures (Signed)
Extubation Procedure Note  Patient Details:   Name: Timothy Browning DOB: Apr 27, 1971 MRN: [id number]   Airway Documentation:    Vent end date: 05/06/23 Vent end time: 1708   Evaluation  O2 sats: stable throughout Complications: No apparent complications Patient did tolerate procedure well. Bilateral Breath Sounds: Clear, Diminished   Yes Prior to extubation positive cuff leak. Pt was extubated to 4L [place], able to state name and is alert RT will continue to monitor, RN at bedside. VC: NIF: >-40  Shali Vesey E Jahrel Borthwick 05/06/2023, 5:08pm
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
## 2023-05-07 ENCOUNTER — Inpatient Hospital Stay (HOSPITAL_COMMUNITY): Payer: Medicaid Other

## 2023-05-07 ENCOUNTER — Encounter (HOSPITAL_COMMUNITY): Payer: Self-pay | Admitting: Thoracic Surgery (Cardiothoracic Vascular Surgery)

## 2023-05-07 DIAGNOSIS — I7121 Aneurysm of the ascending aorta, without rupture: Secondary | ICD-10-CM | POA: Diagnosis not present

## 2023-05-07 DIAGNOSIS — J9811 Atelectasis: Secondary | ICD-10-CM | POA: Diagnosis not present

## 2023-05-07 DIAGNOSIS — J9601 Acute respiratory failure with hypoxia: Secondary | ICD-10-CM

## 2023-05-07 DIAGNOSIS — J939 Pneumothorax, unspecified: Secondary | ICD-10-CM | POA: Diagnosis not present

## 2023-05-07 DIAGNOSIS — I480 Paroxysmal atrial fibrillation: Secondary | ICD-10-CM | POA: Diagnosis not present

## 2023-05-07 DIAGNOSIS — E871 Hypo-osmolality and hyponatremia: Secondary | ICD-10-CM | POA: Diagnosis not present

## 2023-05-07 DIAGNOSIS — Z452 Encounter for adjustment and management of vascular access device: Secondary | ICD-10-CM | POA: Diagnosis not present

## 2023-05-07 DIAGNOSIS — R0989 Other specified symptoms and signs involving the circulatory and respiratory systems: Secondary | ICD-10-CM | POA: Diagnosis not present

## 2023-05-07 LAB — COMPREHENSIVE METABOLIC PANEL
ALT: 26 U/L (ref 0–44)
AST: 52 U/L — ABNORMAL HIGH (ref 15–41)
Albumin: 3.3 g/dL — ABNORMAL LOW (ref 3.5–5.0)
Alkaline Phosphatase: 37 U/L — ABNORMAL LOW (ref 38–126)
Anion gap: 8 (ref 5–15)
BUN: 16 mg/dL (ref 6–20)
CO2: 22 mmol/L (ref 22–32)
Calcium: 7.5 mg/dL — ABNORMAL LOW (ref 8.9–10.3)
Chloride: 103 mmol/L (ref 98–111)
Creatinine, Ser: 1.25 mg/dL — ABNORMAL HIGH (ref 0.61–1.24)
GFR, Estimated: >60 mL/min (ref >60)
Glucose, Bld: 117 mg/dL — ABNORMAL HIGH (ref 70–99)
Potassium: 4 mmol/L (ref 3.5–5.1)
Sodium: 133 mmol/L — ABNORMAL LOW (ref 135–145)
Total Bilirubin: 1.2 mg/dL (ref 0.3–1.2)
Total Protein: 5.4 g/dL — ABNORMAL LOW (ref 6.5–8.1)

## 2023-05-07 LAB — PROTIME-INR
INR: 1.4 — ABNORMAL HIGH (ref 0.8–1.2)
Prothrombin Time: 17.4 seconds — ABNORMAL HIGH (ref 11.4–15.2)

## 2023-05-07 LAB — GLUCOSE, CAPILLARY
Glucose-Capillary: 125 mg/dL — ABNORMAL HIGH (ref 70–99)
Glucose-Capillary: 118 mg/dL — ABNORMAL HIGH (ref 70–99)
Glucose-Capillary: 122 mg/dL — ABNORMAL HIGH (ref 70–99)
Glucose-Capillary: 114 mg/dL — ABNORMAL HIGH (ref 70–99)
Glucose-Capillary: 101 mg/dL — ABNORMAL HIGH (ref 70–99)
Glucose-Capillary: 117 mg/dL — ABNORMAL HIGH (ref 70–99)
Glucose-Capillary: 123 mg/dL — ABNORMAL HIGH (ref 70–99)
Glucose-Capillary: 86 mg/dL (ref 70–99)

## 2023-05-07 LAB — CBC
HCT: 36.7 % — ABNORMAL LOW (ref 39.0–52.0)
HCT: 39 % (ref 39.0–52.0)
HCT: 40.1 % (ref 39.0–52.0)
Hemoglobin: 12.3 g/dL — ABNORMAL LOW (ref 13.0–17.0)
Hemoglobin: 13.1 g/dL (ref 13.0–17.0)
Hemoglobin: 13.5 g/dL (ref 13.0–17.0)
MCH: 30.7 pg (ref 26.0–34.0)
MCH: 30.9 pg (ref 26.0–34.0)
MCH: 31.5 pg (ref 26.0–34.0)
MCHC: 33.5 g/dL (ref 30.0–36.0)
MCHC: 33.6 g/dL (ref 30.0–36.0)
MCHC: 33.7 g/dL (ref 30.0–36.0)
MCV: 91.5 fL (ref 80.0–100.0)
MCV: 92 fL (ref 80.0–100.0)
MCV: 93.5 fL (ref 80.0–100.0)
Platelets: 210 10*3/uL (ref 150–400)
Platelets: 217 10*3/uL (ref 150–400)
Platelets: 204 10*3/uL (ref 150–400)
RBC: 4.01 MIL/uL — ABNORMAL LOW (ref 4.22–5.81)
RBC: 4.24 MIL/uL (ref 4.22–5.81)
RBC: 4.29 MIL/uL (ref 4.22–5.81)
RDW: 13.7 % (ref 11.5–15.5)
RDW: 13.9 % (ref 11.5–15.5)
RDW: 13.8 % (ref 11.5–15.5)
WBC: 13 10*3/uL — ABNORMAL HIGH (ref 4.0–10.5)
WBC: 14.6 10*3/uL — ABNORMAL HIGH (ref 4.0–10.5)
WBC: 17.2 10*3/uL — ABNORMAL HIGH (ref 4.0–10.5)
nRBC: 0 % (ref 0.0–0.2)
nRBC: 0 % (ref 0.0–0.2)
nRBC: 0 % (ref 0.0–0.2)

## 2023-05-07 LAB — BASIC METABOLIC PANEL
Anion gap: 7 (ref 5–15)
BUN: 11 mg/dL (ref 6–20)
CO2: 23 mmol/L (ref 22–32)
Calcium: 8 mg/dL — ABNORMAL LOW (ref 8.9–10.3)
Chloride: 100 mmol/L (ref 98–111)
Creatinine, Ser: 1.13 mg/dL (ref 0.61–1.24)
GFR, Estimated: >60 mL/min (ref >60)
Glucose, Bld: 130 mg/dL — ABNORMAL HIGH (ref 70–99)
Potassium: 4.1 mmol/L (ref 3.5–5.1)
Sodium: 130 mmol/L — ABNORMAL LOW (ref 135–145)

## 2023-05-07 LAB — POCT I-STAT 7, (LYTES, BLD GAS, ICA,H+H)
Acid-base deficit: 2 mmol/L (ref 0.0–2.0)
Bicarbonate: 23.7 mmol/L (ref 20.0–28.0)
Calcium, Ion: 1.15 mmol/L (ref 1.15–1.40)
HCT: 40 % (ref 39.0–52.0)
Hemoglobin: 13.6 g/dL (ref 13.0–17.0)
O2 Saturation: 96 %
Patient temperature: 37.7
Potassium: 4.3 mmol/L (ref 3.5–5.1)
Sodium: 136 mmol/L (ref 135–145)
TCO2: 25 mmol/L (ref 22–32)
pCO2 arterial: 45 mmHg (ref 32–48)
pH, Arterial: 7.333 — ABNORMAL LOW (ref 7.35–7.45)
pO2, Arterial: 90 mmHg (ref 83–108)

## 2023-05-07 LAB — CREATININE, SERUM
Creatinine, Ser: 1.05 mg/dL (ref 0.61–1.24)
GFR, Estimated: >60 mL/min (ref >60)

## 2023-05-07 LAB — MAGNESIUM
Magnesium: 2.4 mg/dL (ref 1.7–2.4)
Magnesium: 2 mg/dL (ref 1.7–2.4)

## 2023-05-07 LAB — SURGICAL PATHOLOGY

## 2023-05-07 MED ORDER — WARFARIN SODIUM 2.5 MG PO TABS
2.5000 mg | ORAL_TABLET | Freq: Once | ORAL | Status: AC
  Administered 2023-05-07: 2.5 mg via ORAL
  Filled 2023-05-07: qty 1

## 2023-05-07 MED ORDER — METHOCARBAMOL 500 MG PO TABS
500.0000 mg | ORAL_TABLET | Freq: Four times a day (QID) | ORAL | Status: AC | PRN
  Administered 2023-05-07 – 2023-05-12 (×2): 500 mg via ORAL
  Filled 2023-05-07 (×2): qty 1

## 2023-05-07 MED ORDER — METOPROLOL TARTRATE 25 MG PO TABS
25.0000 mg | ORAL_TABLET | Freq: Two times a day (BID) | ORAL | Status: DC
  Administered 2023-05-07 – 2023-05-11 (×7): 25 mg via ORAL
  Filled 2023-05-07 (×7): qty 1

## 2023-05-07 MED ORDER — KETOROLAC TROMETHAMINE 30 MG/ML IJ SOLN
30.0000 mg | Freq: Once | INTRAMUSCULAR | Status: AC
  Administered 2023-05-07: 30 mg via INTRAVENOUS
  Filled 2023-05-07: qty 1

## 2023-05-07 MED ORDER — ENOXAPARIN SODIUM 40 MG/0.4ML IJ SOSY
40.0000 mg | PREFILLED_SYRINGE | Freq: Every day | INTRAMUSCULAR | Status: DC
  Administered 2023-05-07: 40 mg via SUBCUTANEOUS
  Filled 2023-05-07: qty 0.4

## 2023-05-07 MED ORDER — WARFARIN - PHARMACIST DOSING INPATIENT: Freq: Every day | Status: DC

## 2023-05-07 MED ORDER — INSULIN ASPART 100 UNIT/ML IJ SOLN
0.0000 [IU] | INTRAMUSCULAR | Status: DC
  Administered 2023-05-08: 4 [IU] via SUBCUTANEOUS
  Administered 2023-05-08 – 2023-05-09 (×6): 2 [IU] via SUBCUTANEOUS
  Administered 2023-05-09: 4 [IU] via SUBCUTANEOUS
  Administered 2023-05-10 (×2): 2 [IU] via SUBCUTANEOUS

## 2023-05-07 MED ORDER — KETOROLAC TROMETHAMINE 15 MG/ML IJ SOLN
15.0000 mg | Freq: Four times a day (QID) | INTRAMUSCULAR | Status: AC
  Administered 2023-05-08 – 2023-05-09 (×4): 15 mg via INTRAVENOUS
  Filled 2023-05-07 (×4): qty 1

## 2023-05-07 NOTE — Progress Notes (Signed)
ANTICOAGULATION CONSULT NOTE - Initial Consult  Pharmacy Consult for Warfarin Indication:  mechanical AVR  No Known Allergies  Patient Measurements: Height: (P) 6' (182.9 cm) Weight: 106.5 kg (234 lb 12.6 oz) IBW/kg (Calculated) : (P) 77.6   Vital Signs: Temp: 100 F (37.8 C) (07/19 1130) Temp Source: Bladder (07/19 0400) BP: 119/74 (07/19 1100) Pulse Rate: 97 (07/19 1130)  Labs: Recent Labs    05/06/23 1045 05/06/23 1059 05/06/23 1155 05/06/23 1311 05/06/23 1315 05/06/23 1653 05/06/23 1837 05/06/23 1956 05/07/23 0151 05/07/23 0310 05/07/23 0949  HGB 11.9*   < > 10.9*   < > 11.4*   < > 13.7   < > 13.6 12.3* 13.1  HCT 35.0*   < > 32.0*   < > 34.0*   < > 40.2   < > 40.0 36.7* 39.0  PLT  --    < >  --   --  171  --  226  --   --  210 217  APTT  --   --   --   --  34  --   --   --   --   --   --   LABPROT  --   --   --   --  19.2*  --   --   --   --   --   --   INR  --   --   --   --  1.6*  --   --   --   --   --   --   CREATININE 1.20  --  1.30*  --   --   --  1.47*  --   --   --   --    < > = values in this interval not displayed.    Estimated Creatinine Clearance: 75 mL/min (A) (by C-G formula based on SCr of 1.47 mg/dL (H)).   Medical History: Past Medical History:  Diagnosis Date   Aortic aneurysm without rupture (HCC)    Chest pain of uncertain etiology 08/11/2021   CKD (chronic kidney disease)    GERD (gastroesophageal reflux disease)    Heart failure (HCC)    Hypertension    Obesity (BMI 30-39.9) 08/11/2021   Sleep apnea       Assessment: 52yom s/p mechanical AVR INR 1.6 post op LFT wnl, cbc stable  Will begin warfarin therapy tonight   Goal of Therapy:  INR 2-3 Monitor platelets by anticoagulation protocol: Yes   Plan:  Warfarin 2.5mg  x1 tonight  Daily protime     Leota Sauers Pharm.D. CPP, BCPS Clinical Pharmacist [phone] 05/07/2023 12:06 PM

## 2023-05-07 NOTE — Progress Notes (Addendum)
      [address]       [place] [id number]             [phone]                 1 Day Post-Op Procedure(s) (LRB): BENTALL PROCEDURE USING SJM AORTIC VALVED GRAFT SIZE (N/A) TRANSESOPHAGEAL ECHOCARDIOGRAM (N/A)   Events: No events _______________________________________________________________ Vitals: BP 116/72   Pulse 100   Temp 99.5 F (37.5 C)   Resp (!) 22   Ht (P) 6' (1.829 m)   Wt 106.5 kg   SpO2 98%   BMI (P) 31.84 kg/m  Filed Weights   05/06/23 0547 05/07/23 0430  Weight: (P) 107.5 kg 106.5 kg     - Neuro: alert NAD  - Cardiovascular: sinus  Drips: none.   CVP:  [2 mmHg-17 mmHg] 6 mmHg CO:  [4 L/min-9.9 L/min] 8.2 L/min CI:  [1.7 L/min/m2-4.3 L/min/m2] 3.8 L/min/m2  - Pulm: EWOB   ABG    Component Value Date/Time   PHART 7.333 (L) 05/07/2023 0151   PCO2ART 45.0 05/07/2023 0151   PO2ART 90 05/07/2023 0151   HCO3 23.7 05/07/2023 0151   TCO2 25 05/07/2023 0151   ACIDBASEDEF 2.0 05/07/2023 0151   O2SAT 96 05/07/2023 0151    - Abd: ND - Extremity: warm  .Intake/Output      07/18 0701 07/19 0700 07/19 0701 07/20 0700   I.V. (mL/kg) 2337.6 (21.9)    Blood 650    IV Piggyback 1758.3    Total Intake(mL/kg) 4745.9 (44.6)    Urine (mL/kg/hr) 4815 (1.9) 120 (0.4)   Blood 1285    Chest Tube 570 40   Total Output 6670 160   Net -1924.1 -160           _______________________________________________________________ Labs:    Latest Ref Rng & Units 05/07/2023    3:10 AM 05/07/2023    1:51 AM 05/06/2023    7:56 PM  CBC  WBC 4.0 - 10.5 K/uL 13.0     Hemoglobin 13.0 - 17.0 g/dL 27.2  53.6  64.4   Hematocrit 39.0 - 52.0 % 36.7  40.0  41.0   Platelets 150 - 400 K/uL 210         Latest Ref Rng & Units 05/07/2023    1:51 AM 05/06/2023    7:56 PM 05/06/2023    6:37 PM  CMP  Glucose 70 - 99 mg/dL   034   BUN 6 - 20 mg/dL   22   Creatinine 7.42 - 1.24 mg/dL   5.95   Sodium 638 - 756 mmol/L 136  136  136   Potassium 3.5  - 5.1 mmol/L 4.3  4.1  4.1   Chloride 98 - 111 mmol/L   101   CO2 22 - 32 mmol/L   22   Calcium 8.9 - 10.3 mg/dL   8.1     CXR: PV Congestion  _______________________________________________________________  Assessment and Plan: POD 1 s/p mBentall  Neuro: pain controlled CV: will remove A line.  Will start coumadin today Pulm: IS, ambulation Renal: creat up slightly.  Will remove foley GI: on diet Heme: coumadin today ID: [email or web address] Endo: SSI Dispo: continue ICU care   Kadiatou Oplinger O Kleber Crean 05/07/2023 9:45 AM
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
## 2023-05-08 ENCOUNTER — Inpatient Hospital Stay (HOSPITAL_COMMUNITY): Payer: Medicaid Other

## 2023-05-08 DIAGNOSIS — J9601 Acute respiratory failure with hypoxia: Secondary | ICD-10-CM | POA: Diagnosis not present

## 2023-05-08 DIAGNOSIS — I7121 Aneurysm of the ascending aorta, without rupture: Secondary | ICD-10-CM | POA: Diagnosis not present

## 2023-05-08 DIAGNOSIS — I480 Paroxysmal atrial fibrillation: Secondary | ICD-10-CM | POA: Diagnosis not present

## 2023-05-08 DIAGNOSIS — J939 Pneumothorax, unspecified: Secondary | ICD-10-CM | POA: Diagnosis not present

## 2023-05-08 DIAGNOSIS — E871 Hypo-osmolality and hyponatremia: Secondary | ICD-10-CM | POA: Diagnosis not present

## 2023-05-08 DIAGNOSIS — J9811 Atelectasis: Secondary | ICD-10-CM | POA: Diagnosis not present

## 2023-05-08 DIAGNOSIS — Z452 Encounter for adjustment and management of vascular access device: Secondary | ICD-10-CM | POA: Diagnosis not present

## 2023-05-08 LAB — GLUCOSE, CAPILLARY
Glucose-Capillary: 151 mg/dL — ABNORMAL HIGH (ref 70–99)
Glucose-Capillary: 177 mg/dL — ABNORMAL HIGH (ref 70–99)
Glucose-Capillary: 143 mg/dL — ABNORMAL HIGH (ref 70–99)
Glucose-Capillary: 140 mg/dL — ABNORMAL HIGH (ref 70–99)
Glucose-Capillary: 109 mg/dL — ABNORMAL HIGH (ref 70–99)
Glucose-Capillary: 133 mg/dL — ABNORMAL HIGH (ref 70–99)

## 2023-05-08 LAB — CBC
HCT: 39.3 % (ref 39.0–52.0)
Hemoglobin: 13 g/dL (ref 13.0–17.0)
MCH: 30.9 pg (ref 26.0–34.0)
MCHC: 33.1 g/dL (ref 30.0–36.0)
MCV: 93.3 fL (ref 80.0–100.0)
Platelets: 210 10*3/uL (ref 150–400)
RBC: 4.21 MIL/uL — ABNORMAL LOW (ref 4.22–5.81)
RDW: 14 % (ref 11.5–15.5)
WBC: 18 10*3/uL — ABNORMAL HIGH (ref 4.0–10.5)
nRBC: 0 % (ref 0.0–0.2)

## 2023-05-08 LAB — BASIC METABOLIC PANEL
Anion gap: 9 (ref 5–15)
BUN: 13 mg/dL (ref 6–20)
CO2: 25 mmol/L (ref 22–32)
Calcium: 8.2 mg/dL — ABNORMAL LOW (ref 8.9–10.3)
Chloride: 98 mmol/L (ref 98–111)
Creatinine, Ser: 1.2 mg/dL (ref 0.61–1.24)
GFR, Estimated: >60 mL/min (ref >60)
Glucose, Bld: 157 mg/dL — ABNORMAL HIGH (ref 70–99)
Potassium: 4.2 mmol/L (ref 3.5–5.1)
Sodium: 132 mmol/L — ABNORMAL LOW (ref 135–145)

## 2023-05-08 LAB — PROTIME-INR
INR: 1.6 — ABNORMAL HIGH (ref 0.8–1.2)
Prothrombin Time: 18.8 seconds — ABNORMAL HIGH (ref 11.4–15.2)

## 2023-05-08 MED ORDER — ASPIRIN 81 MG PO TBEC
81.0000 mg | DELAYED_RELEASE_TABLET | Freq: Every day | ORAL | Status: DC
  Administered 2023-05-08 – 2023-05-14 (×7): 81 mg via ORAL
  Filled 2023-05-08 (×7): qty 1

## 2023-05-08 MED ORDER — AMIODARONE HCL 200 MG PO TABS
400.0000 mg | ORAL_TABLET | Freq: Two times a day (BID) | ORAL | Status: DC
  Administered 2023-05-08 (×2): 400 mg via ORAL
  Filled 2023-05-08 (×2): qty 2

## 2023-05-08 MED ORDER — FUROSEMIDE 10 MG/ML IJ SOLN
40.0000 mg | Freq: Once | INTRAMUSCULAR | Status: AC
  Administered 2023-05-08: 40 mg via INTRAVENOUS
  Filled 2023-05-08: qty 4

## 2023-05-08 MED ORDER — WARFARIN SODIUM 5 MG PO TABS
5.0000 mg | ORAL_TABLET | Freq: Once | ORAL | Status: AC
  Administered 2023-05-08: 5 mg via ORAL
  Filled 2023-05-08: qty 1

## 2023-05-08 NOTE — Progress Notes (Signed)
[address]       [place] [address]             [phone]      2 Days Post-Op  Procedure(s) (LRB): BENTALL PROCEDURE USING SJM AORTIC VALVED GRAFT SIZE (N/A) TRANSESOPHAGEAL ECHOCARDIOGRAM (N/A)   Total Length of Stay:  LOS: 2 days    SUBJECTIVE: Has not walked yet Discomfort improved Having bigeminy  Vitals:   05/08/23 0700 05/08/23 0735  BP: 128/86   Pulse: 96   Resp: 18   Temp:  98.1 F (36.7 C)  SpO2: 97%     Intake/Output      07/19 0701 07/20 0700 07/20 0701 07/21 0700   P.O. 120    I.V. (mL/kg) 190.1 (1.8)    Blood     IV Piggyback 100    Total Intake(mL/kg) 410.1 (3.9)    Urine (mL/kg/hr) 910 (0.4)    Blood     Chest Tube 120    Total Output 1030    Net -619.9             sodium chloride 10 mL/hr at 05/07/23 1900   sodium chloride     sodium chloride 10 mL/hr at 05/07/23 1900   albumin human 999 mL/hr at 05/07/23 1900   lactated ringers     lactated ringers Stopped (05/07/23 1157)   niCARDipine Stopped (05/06/23 1827)    CBC    Component Value Date/Time   WBC 18.0 (H) 05/08/2023 0325   RBC 4.21 (L) 05/08/2023 0325   HGB 13.0 05/08/2023 0325   HGB 14.9 11/17/2022 1112   HCT 39.3 05/08/2023 0325   HCT 44.7 11/17/2022 1112   PLT 210 05/08/2023 0325   PLT 324 11/17/2022 1112   MCV 93.3 05/08/2023 0325   MCV 92 11/17/2022 1112   MCH 30.9 05/08/2023 0325   MCHC 33.1 05/08/2023 0325   RDW 14.0 05/08/2023 0325   RDW 12.8 11/17/2022 1112   LYMPHSABS 1.4 03/21/2023 1215   LYMPHSABS 1.2 11/17/2022 1112   MONOABS 0.7 03/21/2023 1215   EOSABS 0.0 03/21/2023 1215   EOSABS 0.1 11/17/2022 1112   BASOSABS 0.0 03/21/2023 1215   BASOSABS 0.0 11/17/2022 1112   CMP     Component Value Date/Time   NA 132 (L) 05/08/2023 0325   NA 139 11/17/2022 1112   K 4.2 05/08/2023 0325   CL 98 05/08/2023 0325   CO2 25 05/08/2023 0325   GLUCOSE 157 (H) 05/08/2023 0325   BUN 13 05/08/2023 0325   BUN 18 11/17/2022 1112    CREATININE 1.20 05/08/2023 0325   CALCIUM 8.2 (L) 05/08/2023 0325   PROT 5.4 (L) 05/07/2023 0155   PROT 6.8 02/06/2020 1627   ALBUMIN 3.3 (L) 05/07/2023 0155   ALBUMIN 4.1 02/06/2020 1627   AST 52 (H) 05/07/2023 0155   ALT 26 05/07/2023 0155   ALKPHOS 37 (L) 05/07/2023 0155   BILITOT 1.2 05/07/2023 0155   BILITOT <0.2 02/06/2020 1627   GFRNONAA >60 05/08/2023 0325   GFRAA 68 02/06/2020 1627   ABG    Component Value Date/Time   PHART 7.333 (L) 05/07/2023 0151   PCO2ART 45.0 05/07/2023 0151   PO2ART 90 05/07/2023 0151   HCO3 23.7 05/07/2023 0151   TCO2 25 05/07/2023 0151   ACIDBASEDEF 2.0 05/07/2023 0151   O2SAT 96 05/07/2023 0151   CBG (last 3)  Recent Labs    05/07/23 2318 05/08/23 0329 05/08/23 0737  GLUCAP 86 151* 177*  EXAM Lungs: overall clear Card: RR with sharp valve click Ext: warm Neuro: alert and no focal deficits   ASSESSMENT: POD #2 SP Bental with mechanical valve Hemodynamics OK. Will start amio instead of flecanide to address bigeminy. Follow for now Remove chest tubes and pw Diuresis Ambulate INR 1.4. will start heparin if INR not above 1.8 tomorrow Leave in unit   Eugenio Hoes, MD 05/08/2023

## 2023-05-08 NOTE — Progress Notes (Addendum)
ANTICOAGULATION CONSULT NOTE - Follow-Up  Pharmacy Consult for Warfarin Indication:  mechanical AVR  No Known Allergies  Patient Measurements: Height: (P) 6' (182.9 cm) Weight: 105.5 kg (232 lb 9.4 oz) IBW/kg (Calculated) : (P) 77.6   Vital Signs: Temp: 98.3 F (36.8 C) (07/20 1109) Temp Source: Oral (07/20 1109) BP: 121/82 (07/20 1300) Pulse Rate: 102 (07/20 1300)  Labs: Recent Labs    05/06/23 1315 05/06/23 1653 05/07/23 0949 05/07/23 1450 05/07/23 1809 05/08/23 0325 05/08/23 1210  HGB 11.4*   < > 13.1  --  13.5 13.0  --   HCT 34.0*   < > 39.0  --  40.1 39.3  --   PLT 171   < > 217  --  204 210  --   APTT 34  --   --   --   --   --   --   LABPROT 19.2*  --   --  17.4*  --   --  18.8*  INR 1.6*  --   --  1.4*  --   --  1.6*  CREATININE  --    < > 1.05  --  1.13 1.20  --    < > = values in this interval not displayed.    Estimated Creatinine Clearance: 91.5 mL/min (by C-G formula based on SCr of 1.2 mg/dL).   Medical History: Past Medical History:  Diagnosis Date   Aortic aneurysm without rupture (HCC)    Chest pain of uncertain etiology 08/11/2021   CKD (chronic kidney disease)    GERD (gastroesophageal reflux disease)    Heart failure (HCC)    Hypertension    Obesity (BMI 30-39.9) 08/11/2021   Sleep apnea     Assessment: 52yom s/p mechanical AVR INR 1.6 post op > 1.4 > 1.6 LFTs slightly increased but stable: AST 52, ALT 29 CBC stable: Hgb 13, Plt 210   Goal of Therapy:  INR 2-3 Monitor platelets by anticoagulation protocol: Yes   Plan:  Increase dose of warfarin to 5 mg x1, check INR tomorrow AM Daily PT/INR ordered If INR is not >/=1.8, will start bridge with UFH IV infusion   Timothy Browning, PharmD PGY2 Cardiology Pharmacy Resident  05/08/2023 1:43 PM

## 2023-05-08 NOTE — Progress Notes (Signed)
NAME:  Timothy Browning, MRN:  [id number], DOB:  1971/08/15, LOS: 2 ADMISSION DATE:  05/06/2023, CONSULTATION DATE:  05/08/23 REFERRING MD:  Cliffton Asters, CHIEF COMPLAINT:  Thoracic aortic aneurysm repair  History of Present Illness:  Timothy Browning is a 52 y.o. M with PMH significant for CKD, GERD, HTN, OSA, aortic root aneurysm who presented for scheduled aneurysm repair.   He underwent Bentall procedure on 7/18 which was without complications.  He was transferred post-op to the ICU intubated and sedated.   Pertinent  Medical History   has a past medical history of Aortic aneurysm without rupture [facility]), Chest pain of uncertain etiology (08/11/2021), CKD (chronic kidney disease), GERD (gastroesophageal reflux disease), Heart failure (HCC), Hypertension, Obesity (BMI 30-39.9) (08/11/2021), and Sleep apnea.   Significant Hospital Events: Including procedures, antibiotic start and stop dates in addition to other pertinent events   7/18 Aortic root aneurysm repair, transferred to ICU intubated and sedated  7/19 extubated, initially had some hypoxia and required heated HFNC 45L, improved this morning after diuresis and back on 3L [place]  Interim History / Subjective:  Stated surgical team Remain afebrile Noted to have some bigeminy on telemetry  Objective   Blood pressure 117/68, pulse 100, temperature 98.3 F (36.8 C), temperature source Oral, resp. rate (!) 21, height (P) 6' (1.829 m), weight 105.5 kg, SpO2 93%. CVP:  [7 mmHg-55 mmHg] 8 mmHg      Intake/Output Summary (Last 24 hours) at 05/08/2023 1200 Last data filed at 05/08/2023 1108 Gross per 24 hour  Intake 666.53 ml  Output 1055 ml  Net -388.47 ml   Filed Weights   05/06/23 0547 05/07/23 0430 05/08/23 0500  Weight: (P) 107.5 kg 106.5 kg 105.5 kg    Physical exam: General: Middle-aged male, lying on the bed HEENT: [place]/AT, eyes anicteric.  moist mucus membranes Neuro: Alert, awake following commands Chest: Central sternotomy wound  looks clean and dry, coarse breath sounds, no wheezes or rhonchi Heart: Regular rate and rhythm,  Abdomen: Soft, nontender, nondistended, bowel sounds present Skin: No rash  Labs WBC 18 Hb13 Cr 1.2 Na 132  Resolved Hospital Problem list   Post-op ventilator management   Assessment & Plan:  Aortic Root Aneurysm s/p dental procedure with mechanical aortic valve Acute Hypoxic Respiratory Failure-improving AKI, stable Hyponatremia Encourage incentive spirometry Patient is on 1 L oxygen Continue aspirin and metoprolol Started on amiodarone for bigeminy Continue Coumadin, INR is 1.6 Start heparin bridging Coumadin Continue multimodal pain meds Encourage incentive spirometry Monitor intake and output Avoid nephrotoxic agent Closely monitor electrolytes Encourage ambulation  Best Practice (right click and "Reselect all SmartList Selections" daily)   Diet/type: Regular consistency (see orders) DVT prophylaxis: coumadin GI prophylaxis: PPI Lines: NA Foley: NA Code Status:  full code Last date of multidisciplinary goals of care discussion [per primary]  Labs   CBC: Recent Labs  Lab 05/06/23 1837 05/06/23 1956 05/07/23 0151 05/07/23 0310 05/07/23 0949 05/07/23 1809 05/08/23 0325  WBC 12.9*  --   --  13.0* 14.6* 17.2* 18.0*  HGB 13.7   < > 13.6 12.3* 13.1 13.5 13.0  HCT 40.2   < > 40.0 36.7* 39.0 40.1 39.3  MCV 92.8  --   --  91.5 92.0 93.5 93.3  PLT 226  --   --  210 217 204 210   < > = values in this interval not displayed.    Basic Metabolic Panel: Recent Labs  Lab 05/04/23 1115 05/06/23 0822 05/06/23 1155 05/06/23 1311 05/06/23 1837  05/06/23 1956 05/07/23 0151 05/07/23 0155 05/07/23 0949 05/07/23 1809 05/08/23 0325  NA 133*   < > 136   < > 136 136 136 133*  --  130* 132*  K 4.0   < > 4.4   < > 4.1 4.1 4.3 4.0  --  4.1 4.2  CL 104   < > 103  --  101  --   --  103  --  100 98  CO2 21*  --   --   --  22  --   --  22  --  23 25  GLUCOSE 90   < > 139*   --  123*  --   --  117*  --  130* 157*  BUN 17   < > 27*  --  22*  --   --  16  --  11 13  CREATININE 1.33*   < > 1.30*  --  1.47*  --   --  1.25* 1.05 1.13 1.20  CALCIUM 8.7*  --   --   --  8.1*  --   --  7.5*  --  8.0* 8.2*  MG  --   --   --   --  2.9*  --   --  2.4  --  2.0  --    < > = values in this interval not displayed.   GFR: Estimated Creatinine Clearance: 91.5 mL/min (by C-G formula based on SCr of 1.2 mg/dL). Recent Labs  Lab 05/07/23 0310 05/07/23 0949 05/07/23 1809 05/08/23 0325  WBC 13.0* 14.6* 17.2* 18.0*    Liver Function Tests: Recent Labs  Lab 05/04/23 1115 05/07/23 0155  AST 20 52*  ALT 39 26  ALKPHOS 56 37*  BILITOT 0.7 1.2  PROT 7.1 5.4*  ALBUMIN 3.7 3.3*   No results for input(s): "LIPASE", "AMYLASE" in the last 168 hours. No results for input(s): "AMMONIA" in the last 168 hours.  ABG    Component Value Date/Time   PHART 7.333 (L) 05/07/2023 0151   PCO2ART 45.0 05/07/2023 0151   PO2ART 90 05/07/2023 0151   HCO3 23.7 05/07/2023 0151   TCO2 25 05/07/2023 0151   ACIDBASEDEF 2.0 05/07/2023 0151   O2SAT 96 05/07/2023 0151     Coagulation Profile: Recent Labs  Lab 05/04/23 1115 05/06/23 1315 05/07/23 1450  INR 1.0 1.6* 1.4*    Cardiac Enzymes: No results for input(s): "CKTOTAL", "CKMB", "CKMBINDEX", "TROPONINI" in the last 168 hours.  HbA1C: Hgb A1c MFr Bld  Date/Time Value Ref Range Status  05/04/2023 11:15 AM 5.4 4.8 - 5.6 % Final    Comment:    (NOTE) Pre diabetes:          5.7%-6.4%  Diabetes:              >6.4%  Glycemic control for   <7.0% adults with diabetes   07/14/2022 10:42 PM 5.2 4.8 - 5.6 % Final    Comment:    (NOTE) Pre diabetes:          5.7%-6.4%  Diabetes:              >6.4%  Glycemic control for   <7.0% adults with diabetes     CBG: Recent Labs  Lab 05/07/23 1941 05/07/23 2318 05/08/23 0329 05/08/23 0737 05/08/23 1111  GLUCAP 123* 86 151* 177* 143*     Cheri Fowler, MD [facility]  Critical Care See Amion for pager If no response to pager, please call [phone]  until 7pm After 7pm, Please call E-link [phone]

## 2023-05-09 DIAGNOSIS — I480 Paroxysmal atrial fibrillation: Secondary | ICD-10-CM | POA: Diagnosis not present

## 2023-05-09 DIAGNOSIS — E871 Hypo-osmolality and hyponatremia: Secondary | ICD-10-CM | POA: Diagnosis not present

## 2023-05-09 DIAGNOSIS — J9601 Acute respiratory failure with hypoxia: Secondary | ICD-10-CM | POA: Diagnosis not present

## 2023-05-09 DIAGNOSIS — I7121 Aneurysm of the ascending aorta, without rupture: Secondary | ICD-10-CM | POA: Diagnosis not present

## 2023-05-09 LAB — CBC
HCT: 35.9 % — ABNORMAL LOW (ref 39.0–52.0)
Hemoglobin: 11.9 g/dL — ABNORMAL LOW (ref 13.0–17.0)
MCH: 30.2 pg (ref 26.0–34.0)
MCHC: 33.1 g/dL (ref 30.0–36.0)
MCV: 91.1 fL (ref 80.0–100.0)
Platelets: 199 10*3/uL (ref 150–400)
RBC: 3.94 MIL/uL — ABNORMAL LOW (ref 4.22–5.81)
RDW: 13.7 % (ref 11.5–15.5)
WBC: 11.2 10*3/uL — ABNORMAL HIGH (ref 4.0–10.5)
nRBC: 0 % (ref 0.0–0.2)

## 2023-05-09 LAB — PROTIME-INR
INR: 1.8 — ABNORMAL HIGH (ref 0.8–1.2)
Prothrombin Time: 21 seconds — ABNORMAL HIGH (ref 11.4–15.2)

## 2023-05-09 LAB — BASIC METABOLIC PANEL
Anion gap: 6 (ref 5–15)
BUN: 24 mg/dL — ABNORMAL HIGH (ref 6–20)
CO2: 26 mmol/L (ref 22–32)
Calcium: 8 mg/dL — ABNORMAL LOW (ref 8.9–10.3)
Chloride: 101 mmol/L (ref 98–111)
Creatinine, Ser: 1.24 mg/dL (ref 0.61–1.24)
GFR, Estimated: >60 mL/min (ref >60)
Glucose, Bld: 101 mg/dL — ABNORMAL HIGH (ref 70–99)
Potassium: 3.7 mmol/L (ref 3.5–5.1)
Sodium: 133 mmol/L — ABNORMAL LOW (ref 135–145)

## 2023-05-09 LAB — GLUCOSE, CAPILLARY
Glucose-Capillary: 105 mg/dL — ABNORMAL HIGH (ref 70–99)
Glucose-Capillary: 108 mg/dL — ABNORMAL HIGH (ref 70–99)
Glucose-Capillary: 113 mg/dL — ABNORMAL HIGH (ref 70–99)
Glucose-Capillary: 146 mg/dL — ABNORMAL HIGH (ref 70–99)
Glucose-Capillary: 151 mg/dL — ABNORMAL HIGH (ref 70–99)
Glucose-Capillary: 164 mg/dL — ABNORMAL HIGH (ref 70–99)

## 2023-05-09 LAB — MAGNESIUM: Magnesium: 2.1 mg/dL (ref 1.7–2.4)

## 2023-05-09 MED ORDER — AMIODARONE HCL IN DEXTROSE 360-4.14 MG/200ML-% IV SOLN
60.0000 mg/h | INTRAVENOUS | Status: AC
  Administered 2023-05-09: 60 mg/h via INTRAVENOUS
  Filled 2023-05-09: qty 200

## 2023-05-09 MED ORDER — AMIODARONE HCL IN DEXTROSE 360-4.14 MG/200ML-% IV SOLN
INTRAVENOUS | Status: AC
  Administered 2023-05-09: 60 mg/h via INTRAVENOUS
  Filled 2023-05-09: qty 200

## 2023-05-09 MED ORDER — POTASSIUM CHLORIDE CRYS ER 20 MEQ PO TBCR
40.0000 meq | EXTENDED_RELEASE_TABLET | Freq: Once | ORAL | Status: AC
  Administered 2023-05-09: 40 meq via ORAL
  Filled 2023-05-09: qty 2

## 2023-05-09 MED ORDER — AMIODARONE HCL IN DEXTROSE 360-4.14 MG/200ML-% IV SOLN
60.0000 mg/h | INTRAVENOUS | Status: DC
  Administered 2023-05-09: 30 mg/h via INTRAVENOUS
  Administered 2023-05-10 (×3): 60 mg/h via INTRAVENOUS
  Administered 2023-05-10: 30 mg/h via INTRAVENOUS
  Administered 2023-05-11 – 2023-05-12 (×4): 60 mg/h via INTRAVENOUS
  Filled 2023-05-09 (×11): qty 200

## 2023-05-09 MED ORDER — AMIODARONE LOAD VIA INFUSION
150.0000 mg | Freq: Once | INTRAVENOUS | Status: AC
  Administered 2023-05-09: 150 mg via INTRAVENOUS

## 2023-05-09 MED ORDER — FUROSEMIDE 10 MG/ML IJ SOLN
40.0000 mg | Freq: Once | INTRAMUSCULAR | Status: AC
  Administered 2023-05-09: 40 mg via INTRAVENOUS
  Filled 2023-05-09: qty 4

## 2023-05-09 MED ORDER — WARFARIN SODIUM 2.5 MG PO TABS
2.5000 mg | ORAL_TABLET | Freq: Once | ORAL | Status: AC
  Administered 2023-05-09: 2.5 mg via ORAL
  Filled 2023-05-09: qty 1

## 2023-05-09 NOTE — Progress Notes (Signed)
NAME:  Timothy Browning, MRN:  [id number], DOB:  July 21, 1971, LOS: 3 ADMISSION DATE:  05/06/2023, CONSULTATION DATE:  05/09/23 REFERRING MD:  Cliffton Asters, CHIEF COMPLAINT:  Thoracic aortic aneurysm repair  History of Present Illness:  Timothy Browning is a 52 y.o. M with PMH significant for CKD, GERD, HTN, OSA, aortic root aneurysm who presented for scheduled aneurysm repair.   He underwent Bentall procedure on 7/18 which was without complications.  He was transferred post-op to the ICU intubated and sedated.   Pertinent  Medical History   has a past medical history of Aortic aneurysm without rupture [facility]), Chest pain of uncertain etiology (08/11/2021), CKD (chronic kidney disease), GERD (gastroesophageal reflux disease), Heart failure (HCC), Hypertension, Obesity (BMI 30-39.9) (08/11/2021), and Sleep apnea.   Significant Hospital Events: Including procedures, antibiotic start and stop dates in addition to other pertinent events   7/18 Aortic root aneurysm repair, transferred to ICU intubated and sedated  7/19 extubated, initially had some hypoxia and required heated HFNC 45L, improved this morning after diuresis and back on 3L [place]  Interim History / Subjective:  Went into A-fib with RVR Stated pain is well-controlled No overnight issues  Objective   Blood pressure 103/72, pulse 99, temperature 97.9 F (36.6 C), temperature source Oral, resp. rate (!) 24, height (P) 6' (1.829 m), weight 104.3 kg, SpO2 94%.        Intake/Output Summary (Last 24 hours) at 05/09/2023 1304 Last data filed at 05/09/2023 1200 Gross per 24 hour  Intake 216.85 ml  Output 1150 ml  Net -933.15 ml   Filed Weights   05/07/23 0430 05/08/23 0500 05/09/23 0700  Weight: 106.5 kg 105.5 kg 104.3 kg   Physical exam: General: Middle-age male, sitting on recliner HEENT: [place]/AT, eyes anicteric.  moist mucus membranes Neuro: Alert, awake following commands Chest: Coarse breath sounds, no wheezes or rhonchi Heart:  Irregularly irregular, tachycardic, no murmurs or gallops Abdomen: Soft, nontender, nondistended, bowel sounds present Skin: No rash  Labs WBC 11.2 Hb 11.9 Cr 1.2 Na 133  Resolved Hospital Problem list   Post-op ventilator management  Acute Hypoxic Respiratory Failure-improving Assessment & Plan:  Aortic Root Aneurysm s/p dental procedure with mechanical aortic valve AKI, stable Hyponatremia Paroxysmal A-fib with RVR Encourage incentive spirometry Patient is on room air Continue aspirin and metoprolol Switch to oral amiodarone to IV infusion Continue Coumadin, INR is 1.8 Continue Coumadin Continue multimodal pain meds Encourage incentive spirometry Monitor intake and output Avoid nephrotoxic agent Closely monitor electrolytes Encourage ambulation  Best Practice (right click and "Reselect all SmartList Selections" daily)   Diet/type: Regular consistency (see orders) DVT prophylaxis: coumadin GI prophylaxis: PPI Lines: NA Foley: NA Code Status:  full code Last date of multidisciplinary goals of care discussion [per primary]  Labs   CBC: Recent Labs  Lab 05/07/23 0310 05/07/23 0949 05/07/23 1809 05/08/23 0325 05/09/23 0621  WBC 13.0* 14.6* 17.2* 18.0* 11.2*  HGB 12.3* 13.1 13.5 13.0 11.9*  HCT 36.7* 39.0 40.1 39.3 35.9*  MCV 91.5 92.0 93.5 93.3 91.1  PLT 210 217 204 210 199    Basic Metabolic Panel: Recent Labs  Lab 05/06/23 1837 05/06/23 1956 05/07/23 0151 05/07/23 0155 05/07/23 0949 05/07/23 1809 05/08/23 0325 05/09/23 0621 05/09/23 0955  NA 136   < > 136 133*  --  130* 132* 133*  --   K 4.1   < > 4.3 4.0  --  4.1 4.2 3.7  --   CL 101  --   --  103  --  100 98 101  --   CO2 22  --   --  22  --  23 25 26   --   GLUCOSE 123*  --   --  117*  --  130* 157* 101*  --   BUN 22*  --   --  16  --  11 13 24*  --   CREATININE 1.47*  --   --  1.25* 1.05 1.13 1.20 1.24  --   CALCIUM 8.1*  --   --  7.5*  --  8.0* 8.2* 8.0*  --   MG 2.9*  --   --  2.4  --   2.0  --   --  2.1   < > = values in this interval not displayed.   GFR: Estimated Creatinine Clearance: 88 mL/min (by C-G formula based on SCr of 1.24 mg/dL). Recent Labs  Lab 05/07/23 0949 05/07/23 1809 05/08/23 0325 05/09/23 0621  WBC 14.6* 17.2* 18.0* 11.2*    Liver Function Tests: Recent Labs  Lab 05/04/23 1115 05/07/23 0155  AST 20 52*  ALT 39 26  ALKPHOS 56 37*  BILITOT 0.7 1.2  PROT 7.1 5.4*  ALBUMIN 3.7 3.3*   No results for input(s): "LIPASE", "AMYLASE" in the last 168 hours. No results for input(s): "AMMONIA" in the last 168 hours.  ABG    Component Value Date/Time   PHART 7.333 (L) 05/07/2023 0151   PCO2ART 45.0 05/07/2023 0151   PO2ART 90 05/07/2023 0151   HCO3 23.7 05/07/2023 0151   TCO2 25 05/07/2023 0151   ACIDBASEDEF 2.0 05/07/2023 0151   O2SAT 96 05/07/2023 0151     Coagulation Profile: Recent Labs  Lab 05/04/23 1115 05/06/23 1315 05/07/23 1450 05/08/23 1210 05/09/23 0621  INR 1.0 1.6* 1.4* 1.6* 1.8*    Cardiac Enzymes: No results for input(s): "CKTOTAL", "CKMB", "CKMBINDEX", "TROPONINI" in the last 168 hours.  HbA1C: Hgb A1c MFr Bld  Date/Time Value Ref Range Status  05/04/2023 11:15 AM 5.4 4.8 - 5.6 % Final    Comment:    (NOTE) Pre diabetes:          5.7%-6.4%  Diabetes:              >6.4%  Glycemic control for   <7.0% adults with diabetes   07/14/2022 10:42 PM 5.2 4.8 - 5.6 % Final    Comment:    (NOTE) Pre diabetes:          5.7%-6.4%  Diabetes:              >6.4%  Glycemic control for   <7.0% adults with diabetes     CBG: Recent Labs  Lab 05/08/23 1948 05/08/23 2323 05/09/23 0324 05/09/23 0735 05/09/23 1125  GLUCAP 109* 133* 108* 164* 113*     Cheri Fowler, MD [facility] See Amion for pager If no response to pager, please call [phone] until 7pm After 7pm, Please call E-link [phone]

## 2023-05-09 NOTE — Plan of Care (Signed)

## 2023-05-09 NOTE — Progress Notes (Signed)
[address]       [employer] [place] [address]             [phone]      3 Days Post-Op  Procedure(s) (LRB): BENTALL PROCEDURE USING SJM AORTIC VALVED GRAFT SIZE (N/A) TRANSESOPHAGEAL ECHOCARDIOGRAM (N/A)   Total Length of Stay:  LOS: 3 days    SUBJECTIVE: No specific complaints other than nausea Ambulating  Vitals:   05/09/23 0600 05/09/23 0700  BP: 115/77 118/83  Pulse: (!) 101 (!) 105  Resp: (!) 31 20  Temp:    SpO2: 94% 93%    Intake/Output      07/20 0701 07/21 0700 07/21 0701 07/22 0700   P.O.     I.V. (mL/kg) 279 (2.7)    IV Piggyback 200    Total Intake(mL/kg) 479 (4.6)    Urine (mL/kg/hr) 1150 (0.5)    Chest Tube 60    Total Output 1210    Net -731         Urine Occurrence 2 x        albumin human 999 mL/hr at 05/08/23 0800   amiodarone 60 mg/hr (05/09/23 0749)   Followed by   amiodarone      CBC    Component Value Date/Time   WBC 11.2 (H) 05/09/2023 0621   RBC 3.94 (L) 05/09/2023 0621   HGB 11.9 (L) 05/09/2023 0621   HGB 14.9 11/17/2022 1112   HCT 35.9 (L) 05/09/2023 0621   HCT 44.7 11/17/2022 1112   PLT 199 05/09/2023 0621   PLT 324 11/17/2022 1112   MCV 91.1 05/09/2023 0621   MCV 92 11/17/2022 1112   MCH 30.2 05/09/2023 0621   MCHC 33.1 05/09/2023 0621   RDW 13.7 05/09/2023 0621   RDW 12.8 11/17/2022 1112   LYMPHSABS 1.4 03/21/2023 1215   LYMPHSABS 1.2 11/17/2022 1112   MONOABS 0.7 03/21/2023 1215   EOSABS 0.0 03/21/2023 1215   EOSABS 0.1 11/17/2022 1112   BASOSABS 0.0 03/21/2023 1215   BASOSABS 0.0 11/17/2022 1112   CMP     Component Value Date/Time   NA 133 (L) 05/09/2023 0621   NA 139 11/17/2022 1112   K 3.7 05/09/2023 0621   CL 101 05/09/2023 0621   CO2 26 05/09/2023 0621   GLUCOSE 101 (H) 05/09/2023 0621   BUN 24 (H) 05/09/2023 0621   BUN 18 11/17/2022 1112   CREATININE 1.24 05/09/2023 0621   CALCIUM 8.0 (L) 05/09/2023 0621   PROT 5.4 (L) 05/07/2023 0155   PROT 6.8 02/06/2020 1627    ALBUMIN 3.3 (L) 05/07/2023 0155   ALBUMIN 4.1 02/06/2020 1627   AST 52 (H) 05/07/2023 0155   ALT 26 05/07/2023 0155   ALKPHOS 37 (L) 05/07/2023 0155   BILITOT 1.2 05/07/2023 0155   BILITOT <0.2 02/06/2020 1627   GFRNONAA >60 05/09/2023 0621   GFRAA 68 02/06/2020 1627   ABG    Component Value Date/Time   PHART 7.333 (L) 05/07/2023 0151   PCO2ART 45.0 05/07/2023 0151   PO2ART 90 05/07/2023 0151   HCO3 23.7 05/07/2023 0151   TCO2 25 05/07/2023 0151   ACIDBASEDEF 2.0 05/07/2023 0151   O2SAT 96 05/07/2023 0151   CBG (last 3)  Recent Labs    05/08/23 2323 05/09/23 0324 05/09/23 0735  GLUCAP 133* 108* 164*  EXAM Lungs: clear Card: RR (just went into rapid afib while examing) Ext: warm Neuro Intact   ASSESSMENT: POD # # sp Mechanical Bental Rapid Afib:  will start the amio protocol. Will hold po to see if helps nausea INR 1.8: will hold on heparin drip for now. Continue coumadin Still on nasal cannula, will continue diuresis Leave in unit today   Eugenio Hoes, MD 05/09/2023

## 2023-05-09 NOTE — Progress Notes (Signed)
ANTICOAGULATION CONSULT NOTE - Follow-Up  Pharmacy Consult for Warfarin Indication:  mechanical AVR  No Known Allergies  Patient Measurements: Height: (P) 6' (182.9 cm) Weight: 104.3 kg (229 lb 15 oz) IBW/kg (Calculated) : (P) 77.6   Vital Signs: Temp: 97.9 F (36.6 C) (07/21 1127) Temp Source: Oral (07/21 1127) BP: 103/72 (07/21 1200) Pulse Rate: 121 (07/21 1300)  Labs: Recent Labs    05/07/23 1450 05/07/23 1809 05/08/23 0325 05/08/23 1210 05/09/23 0621  HGB  --  13.5 13.0  --  11.9*  HCT  --  40.1 39.3  --  35.9*  PLT  --  204 210  --  199  LABPROT 17.4*  --   --  18.8* 21.0*  INR 1.4*  --   --  1.6* 1.8*  CREATININE  --  1.13 1.20  --  1.24    Estimated Creatinine Clearance: 88 mL/min (by C-G formula based on SCr of 1.24 mg/dL).   Medical History: Past Medical History:  Diagnosis Date   Aortic aneurysm without rupture (HCC)    Chest pain of uncertain etiology 08/11/2021   CKD (chronic kidney disease)    GERD (gastroesophageal reflux disease)    Heart failure (HCC)    Hypertension    Obesity (BMI 30-39.9) 08/11/2021   Sleep apnea     Assessment: 52yom s/p mechanical AVR INR 1.6 post op > 1.4 > 1.6 > 1.8 Does not require bridging with UFH, with INR of 1.8 now and target range of 2-3. Pt flipped from sinus tach to Afib this AM and was switched from oral amiodarone load to amiodarone IV infusion. With higher serum concentrations of amiodarone will result in an increase in serum levels of warfarin. CBC stable: Hgb 13.0 > 11.9, Plt 210 > 199   Goal of Therapy:  INR 2-3 Monitor platelets by anticoagulation protocol: Yes   Plan:  Reduce dose of warfarin 5 mg > 2.5 mg x1 Monitor DDI with warfarin. Daily PT/INR ordered   [name], PharmD PGY2 Cardiology Pharmacy Resident 05/09/2023 1:46 PM

## 2023-05-10 DIAGNOSIS — J9601 Acute respiratory failure with hypoxia: Secondary | ICD-10-CM | POA: Diagnosis not present

## 2023-05-10 DIAGNOSIS — E871 Hypo-osmolality and hyponatremia: Secondary | ICD-10-CM | POA: Diagnosis not present

## 2023-05-10 DIAGNOSIS — I480 Paroxysmal atrial fibrillation: Secondary | ICD-10-CM | POA: Diagnosis not present

## 2023-05-10 DIAGNOSIS — I7121 Aneurysm of the ascending aorta, without rupture: Secondary | ICD-10-CM | POA: Diagnosis not present

## 2023-05-10 LAB — BASIC METABOLIC PANEL
Anion gap: 6 (ref 5–15)
BUN: 20 mg/dL (ref 6–20)
CO2: 28 mmol/L (ref 22–32)
Calcium: 7.9 mg/dL — ABNORMAL LOW (ref 8.9–10.3)
Chloride: 100 mmol/L (ref 98–111)
Creatinine, Ser: 0.88 mg/dL (ref 0.61–1.24)
GFR, Estimated: >60 mL/min (ref >60)
Glucose, Bld: 97 mg/dL (ref 70–99)
Potassium: 3.7 mmol/L (ref 3.5–5.1)
Sodium: 134 mmol/L — ABNORMAL LOW (ref 135–145)

## 2023-05-10 LAB — CBC
HCT: 34.8 % — ABNORMAL LOW (ref 39.0–52.0)
Hemoglobin: 11.8 g/dL — ABNORMAL LOW (ref 13.0–17.0)
MCH: 30.6 pg (ref 26.0–34.0)
MCHC: 33.9 g/dL (ref 30.0–36.0)
MCV: 90.4 fL (ref 80.0–100.0)
Platelets: 247 10*3/uL (ref 150–400)
RBC: 3.85 MIL/uL — ABNORMAL LOW (ref 4.22–5.81)
RDW: 13.9 % (ref 11.5–15.5)
WBC: 9.2 10*3/uL (ref 4.0–10.5)
nRBC: 0 % (ref 0.0–0.2)

## 2023-05-10 LAB — GLUCOSE, CAPILLARY
Glucose-Capillary: 111 mg/dL — ABNORMAL HIGH (ref 70–99)
Glucose-Capillary: 139 mg/dL — ABNORMAL HIGH (ref 70–99)
Glucose-Capillary: 133 mg/dL — ABNORMAL HIGH (ref 70–99)
Glucose-Capillary: 104 mg/dL — ABNORMAL HIGH (ref 70–99)
Glucose-Capillary: 76 mg/dL (ref 70–99)
Glucose-Capillary: 114 mg/dL — ABNORMAL HIGH (ref 70–99)

## 2023-05-10 LAB — PROTIME-INR
INR: 1.8 — ABNORMAL HIGH (ref 0.8–1.2)
Prothrombin Time: 21 seconds — ABNORMAL HIGH (ref 11.4–15.2)

## 2023-05-10 LAB — MAGNESIUM: Magnesium: 1.8 mg/dL (ref 1.7–2.4)

## 2023-05-10 MED ORDER — MAGNESIUM SULFATE 2 GM/50ML IV SOLN
2.0000 g | Freq: Once | INTRAVENOUS | Status: AC
  Administered 2023-05-10: 2 g via INTRAVENOUS
  Filled 2023-05-10: qty 50

## 2023-05-10 MED ORDER — ROSUVASTATIN CALCIUM 5 MG PO TABS
5.0000 mg | ORAL_TABLET | Freq: Every day | ORAL | Status: DC
  Administered 2023-05-10 – 2023-05-13 (×4): 5 mg via ORAL
  Filled 2023-05-10 (×4): qty 1

## 2023-05-10 MED ORDER — AMIODARONE IV BOLUS ONLY 150 MG/100ML
150.0000 mg | Freq: Once | INTRAVENOUS | Status: AC
  Administered 2023-05-10: 150 mg via INTRAVENOUS

## 2023-05-10 MED ORDER — WARFARIN SODIUM 2.5 MG PO TABS
2.5000 mg | ORAL_TABLET | Freq: Once | ORAL | Status: AC
  Administered 2023-05-10: 2.5 mg via ORAL
  Filled 2023-05-10: qty 1

## 2023-05-10 MED ORDER — POTASSIUM CHLORIDE CRYS ER 20 MEQ PO TBCR
40.0000 meq | EXTENDED_RELEASE_TABLET | Freq: Once | ORAL | Status: AC
  Administered 2023-05-10: 40 meq via ORAL
  Filled 2023-05-10: qty 2

## 2023-05-10 MED FILL — Lidocaine HCl Local Preservative Free (PF) Inj 2%: INTRAMUSCULAR | Qty: 14 | Status: AC

## 2023-05-10 MED FILL — Heparin Sodium (Porcine) Inj 1000 Unit/ML: Qty: 1000 | Status: AC

## 2023-05-10 MED FILL — Potassium Chloride Inj 2 mEq/ML: INTRAVENOUS | Qty: 40 | Status: AC

## 2023-05-10 NOTE — Progress Notes (Signed)
ANTICOAGULATION CONSULT NOTE - Follow-Up  Pharmacy Consult for Warfarin Indication:  mechanical AVR  No Known Allergies  Patient Measurements: Height: (P) 6' (182.9 cm) Weight: 106.7 kg (235 lb 3.7 oz) IBW/kg (Calculated) : (P) 77.6   Vital Signs: Temp: 98.6 F (37 C) (07/22 0315) Temp Source: Oral (07/22 0315) BP: 156/84 (07/22 0704) Pulse Rate: 132 (07/22 0704)  Labs: Recent Labs    05/08/23 0325 05/08/23 1210 05/09/23 0621 05/10/23 0611  HGB 13.0  --  11.9* 11.8*  HCT 39.3  --  35.9* 34.8*  PLT 210  --  199 247  LABPROT  --  18.8* 21.0* 21.0*  INR  --  1.6* 1.8* 1.8*  CREATININE 1.20  --  1.24 0.88    Estimated Creatinine Clearance: 125.3 mL/min (by C-G formula based on SCr of 0.88 mg/dL).   Medical History: Past Medical History:  Diagnosis Date   Aortic aneurysm without rupture (HCC)    Chest pain of uncertain etiology 08/11/2021   CKD (chronic kidney disease)    GERD (gastroesophageal reflux disease)    Heart failure (HCC)    Hypertension    Obesity (BMI 30-39.9) 08/11/2021   Sleep apnea     Assessment: 52yom s/p mechanical AVR started on warfarin per pharmacy. INR today is 1.8. Pt remains on amiodarone and has poor appetite.  Goal of Therapy:  INR 2-3 Monitor platelets by anticoagulation protocol: Yes   Plan:  Warfarin 2.5mg  PO x1 tonight Daily INR  Fredonia Highland, PharmD, BCPS, [facility] Clinical Pharmacist [phone] Please check AMION for all [facility] Pharmacy numbers 05/10/2023

## 2023-05-10 NOTE — Progress Notes (Addendum)
TCTS DAILY ICU PROGRESS NOTE                   [address]            [place] [address]          [id number]   4 Days Post-Op Procedure(s) (LRB): BENTALL PROCEDURE USING SJM AORTIC VALVED GRAFT SIZE (N/A) TRANSESOPHAGEAL ECHOCARDIOGRAM (N/A)  Total Length of Stay:  LOS: 4 days   Subjective: Some incisional discomfort  Objective: Vital signs in last 24 hours: Temp:  [97.7 F (36.5 C)-98.6 F (37 C)] 97.7 F (36.5 C) (07/22 0805) Pulse Rate:  [61-132] 132 (07/22 0704) Cardiac Rhythm: Normal sinus rhythm (07/22 0400) Resp:  [5-25] 18 (07/22 0704) BP: (95-156)/(65-94) 156/84 (07/22 0704) SpO2:  [91 %-96 %] 94 % (07/22 0704) Weight:  [106.7 kg] 106.7 kg (07/22 0600)  Filed Weights   05/08/23 0500 05/09/23 0700 05/10/23 0600  Weight: 105.5 kg 104.3 kg 106.7 kg    Weight change: 2.4 kg   Hemodynamic parameters for last 24 hours:    Intake/Output from previous day: 07/21 0701 - 07/22 0700 In: 577.7 [I.V.:577.7] Out: 1100 [Urine:1100]  Intake/Output this shift: No intake/output data recorded.  Current Meds: Scheduled Meds:  acetaminophen (TYLENOL) oral liquid 160 mg/5 mL  650 mg Per Tube Once   acetaminophen  1,000 mg Oral Q6H   aspirin EC  81 mg Oral Daily   bisacodyl  10 mg Oral Daily   Or   bisacodyl  10 mg Rectal Daily   Chlorhexidine Gluconate Cloth  6 each Topical Daily   docusate sodium  200 mg Oral Daily   insulin aspart  0-24 Units Subcutaneous Q4H   lidocaine  1 patch Transdermal q1800   metoprolol tartrate  25 mg Oral BID   pantoprazole  40 mg Oral Daily   warfarin  2.5 mg Oral ONCE-1600   Warfarin - Pharmacist Dosing Inpatient   Does not apply q1600   Continuous Infusions:  albumin human 999 mL/hr at 05/10/23 0600   amiodarone 60 mg/hr (05/10/23 0742)   PRN Meds:.albumin human, gabapentin, methocarbamol, metoprolol tartrate, midazolam, morphine injection, ondansetron (ZOFRAN) IV, mouth rinse, oxyCODONE,  traMADol  General appearance: alert, cooperative, and no distress Heart: irregularly irregular rhythm Lungs: minimally dim in bases Abdomen: benign Extremities: no edema Wound: incis healing well  Lab Results: CBC: Recent Labs    05/09/23 0621 05/10/23 0611  WBC 11.2* 9.2  HGB 11.9* 11.8*  HCT 35.9* 34.8*  PLT 199 247   BMET:  Recent Labs    05/09/23 0621 05/10/23 0611  NA 133* 134*  K 3.7 3.7  CL 101 100  CO2 26 28  GLUCOSE 101* 97  BUN 24* 20  CREATININE 1.24 0.88  CALCIUM 8.0* 7.9*    CMET: Lab Results  Component Value Date   WBC 9.2 05/10/2023   HGB 11.8 (L) 05/10/2023   HCT 34.8 (L) 05/10/2023   PLT 247 05/10/2023   GLUCOSE 97 05/10/2023   CHOL 203 (H) 07/14/2022   TRIG 70 07/14/2022   HDL 57 07/14/2022   LDLCALC 132 (H) 07/14/2022   ALT 26 05/07/2023   AST 52 (H) 05/07/2023   NA 134 (L) 05/10/2023   K 3.7 05/10/2023   CL 100 05/10/2023   CREATININE 0.88 05/10/2023   BUN 20 05/10/2023   CO2 28 05/10/2023   TSH 2.196 07/14/2022   INR 1.8 (H) 05/10/2023   HGBA1C 5.4 05/04/2023  PT/INR:  Recent Labs    05/10/23 0611  LABPROT 21.0*  INR 1.8*   Radiology: No results found.   Assessment/Plan: S/P Procedure(s) (LRB): BENTALL PROCEDURE USING SJM AORTIC VALVED GRAFT SIZE (N/A) TRANSESOPHAGEAL ECHOCARDIOGRAM (N/A)  POD#4  1 afebrile, s BP variable 95-156, afib w/RVR 2 O2 sats good on 1 liters 3 fair UOP, weight below preop if accurate, normal renal fxn 4 CBG - good control 5 mild expected ABLA 6 leukocytosis resolved 7 INR 1.8, mech AVR- pharm dosing coumadin 8 no CXR - repeat in am 9 routine pulm hygiene and rehab 10 leave in unit till afib rate more controlled/or converts    Rowe Clack PA-C Pager [phone] 05/10/2023 8:09 AM  Patient examined and medical records reviewed. Patient progressing after mechanical Bentall root replacement. He is having intermittent A-fib/flutter on IV amiodarone. He was able to  ambulate in the hallway this morning and is generally stronger with less pain and less nausea. His central line remains for amiodarone bolusing as needed. Otherwise surgical incision are fine and edema is minimal.  We will supplement magnesium and potassium, continue IV amiodarone and leave the patient in the ICU while we are titrating meds for his A-fib.   patient examined and medical record reviewed,agree with above note. Lovett Sox 05/10/2023

## 2023-05-10 NOTE — Hospital Course (Signed)
Referring: Timothy Ripple, DO Primary Care: Marrianne Mood, MD Primary Cardiologist:Kardie Tobb, DO  History of Present Illness:     52 year old male with past medical history notable for chronic kidney disease, obesity, GERD, and heart failure presents in follow-up to discuss the results of his surveillance CT scan his aortic root aneurysm.  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  He also has a history of some palpitations, which is currently being followed by cardiology.  We discussed the risks and benefits of with valve sparing root replacement given the size, and his concern for dissection.  He is agreeable to proceed.  His repeat echo is clean, and he has had cardiac clearance.      Hospital Course: Timothy Browning was admitted for elective surgery on 05/06/23 and taken to the OR where upon direct inspection he was found to have multiple fenestration of all three aortic valve leaflets in addition to the ascending aortic aneurysm.  Dr. Cliffton Asters proceeded with aortic valve replacement and aneurysm repair with a Bentall procedure utilizing a 29mm aortic valved conduit.  Following the procedure he separated from cardiopulmonary bypass without difficulty without any inotropic or pressor support. He was transferred to the ICU in stable condition. He was weaned from the mechanical ventilator support and extubated by 5:30 pm on the day of surgery. The critical care medicine team assisted with ICU care in the early post-op phase of recovery.  Hypoxia immediately after extubation was managed with heated high-flow O2 but he was quickly weaned back down to 3Lnc O2. He remained hemodynamically stable. He was started on Coumadin anticoagulation for the mechanical heart valve.  Coumadin dosing was managed by the inpatient pharmacy team. Daily INR's were monitored. He developed atrial fibrillation with RVR on post-op day 3.  IV amiodarone loading was initiated. He converted to an accelerated junctional  rhythm.  We consulted Dr. Thomasene Browning as she had been managing his palpitations (which consisted of frequent PAC's and PVC's) with flecainide prior to admission.  He was transferred to 4E Progressive Care on 05/12/23.  He continued in an accelerated junctional rhythm and continued to have a sense of palpitations associated with dizziness brought on by minimal activity.

## 2023-05-10 NOTE — Progress Notes (Signed)
NAME:  Timothy Browning, MRN:  [id number], DOB:  11/17/1970, LOS: 4 ADMISSION DATE:  05/06/2023, CONSULTATION DATE:  05/06/2023 REFERRING MD:  Cliffton Asters - TCTS, CHIEF COMPLAINT:  Thoracic aortic aneurysm repair  History of Present Illness:  52 year old man with PMHx significant for CKD, GERD, HTN, OSA, aortic root aneurysm who presented for scheduled aneurysm repair. Underwent Bentall procedure on 7/18 which was without complications.  He was transferred post-op to the ICU intubated and sedated.  PCCM consulted for assistance with vent management postoperatively.  Pertinent Medical History:   has a past medical history of Aortic aneurysm without rupture [facility]), Chest pain of uncertain etiology (08/11/2021), CKD (chronic kidney disease), GERD (gastroesophageal reflux disease), Heart failure (HCC), Hypertension, Obesity (BMI 30-39.9) (08/11/2021), and Sleep apnea.  Significant Hospital Events: Including procedures, antibiotic start and stop dates in addition to other pertinent events   7/18 Aortic root aneurysm repair, transferred to ICU intubated and sedated  7/19 POD#1 Extubated, initially had some hypoxia and required heated HFNC 45L, improved this morning after diuresis and back on 3L [place] 7/22 POD#4 O2 needs continue to decrease, down to [facility]. Afib with RVR (120s-130s) while walking this morning, rate now 90s NSR.  Interim History / Subjective:  No significant events overnight Up to chair on rounds Feeling well overall Mild chest tightness, feels like a "pulled muscle" Down to [facility] Episode of AF with RVR while ambulating this AM, HR jumped to 120s-130s Now in NSR, rate 90s  Objective:  Blood pressure (!) 124/101, pulse (!) 103, temperature 97.7 F (36.5 C), temperature source Oral, resp. rate 18, height (P) 6' (1.829 m), weight 106.7 kg, SpO2 94%.        Intake/Output Summary (Last 24 hours) at 05/10/2023 0854 Last data filed at 05/10/2023 0600 Gross per 24 hour  Intake 577.68 ml   Output 900 ml  Net -322.32 ml   Filed Weights   05/08/23 0500 05/09/23 0700 05/10/23 0600  Weight: 105.5 kg 104.3 kg 106.7 kg   Physical Examination: General: Acutely ill-appearing middle-aged man in NAD. Pleasant and conversant, sitting up in chair. HEENT: [place]/AT, anicteric sclera, PERRL, moist mucous membranes. Neuro: Awake, oriented x 4. Responds to verbal stimuli. Following commands consistently. Moves all 4 extremities spontaneously. CV: RRR (NSR, 90s), no m/g/r. Mild musculoskeletal CP on palpation. PULM: Breathing even and unlabored on 1LNC. Lung fields CTAB. GI: Soft, nontender, nondistended. Normoactive bowel sounds. Extremities: Trace bilateral symmetric LE edema noted. Skin: Warm/dry, no rashes.  Resolved Hospital Problem List:  Post-op ventilator management  Acute Hypoxic Respiratory Failure-improving  Assessment & Plan:  Aortic Root Aneurysm s/p dental procedure with mechanical aortic valve POD#4 from aortic root repair with [place] (TCTS). - Postoperative management per TCTS - Multimodal pain control, maximize adjunct medications as able - IS, pulmonary hygiene - Optimize volume status, goal euvolemia  Paroxysmal A-fib with RVR - Coumadin for AC - Monitor INR - Continue ASA - Continue amiodarone gtt - Metoprolol for rate control - Optimize electrolyes (K > 4, Mg > 2) - Cardiac monitoring  AKI, stable - Trend BMP - Replete electrolytes as indicated - Monitor I&Os - Avoid nephrotoxic agents as able - Ensure adequate renal perfusion  Hyponatremia - Trend BMP, Na improving - Replete electrolytes as indicated - Monitor I&Os  Best Practice (right click and "Reselect all SmartList Selections" daily)   Diet/type: Regular consistency (see orders) DVT prophylaxis: coumadin GI prophylaxis: PPI Lines: NA Foley: NA Code Status:  full code Last date of multidisciplinary goals of  care discussion [Per Primary Team]  Signature:   Faythe Ghee [facility] Pulmonary & Critical Care 05/10/23 8:54 AM  Please see [email or web address] for pager details.  From 7A-7P if no response, please call [phone] After hours, please call ELink [phone]

## 2023-05-10 NOTE — Progress Notes (Signed)
Patient ID: Timothy Browning, male   DOB: [birth year], 52 y.o.   MRN: [id number] TCTS Evening Rounds:  Hemodynamically stable  Sinus tachy 110-115. On Lopressor. No atrial fib.  UO ok  Ambulating well.

## 2023-05-11 ENCOUNTER — Inpatient Hospital Stay (HOSPITAL_COMMUNITY): Payer: Medicaid Other

## 2023-05-11 DIAGNOSIS — N179 Acute kidney failure, unspecified: Secondary | ICD-10-CM | POA: Diagnosis not present

## 2023-05-11 DIAGNOSIS — E871 Hypo-osmolality and hyponatremia: Secondary | ICD-10-CM | POA: Diagnosis not present

## 2023-05-11 DIAGNOSIS — I442 Atrioventricular block, complete: Secondary | ICD-10-CM | POA: Diagnosis not present

## 2023-05-11 DIAGNOSIS — R918 Other nonspecific abnormal finding of lung field: Secondary | ICD-10-CM | POA: Diagnosis not present

## 2023-05-11 DIAGNOSIS — Z4682 Encounter for fitting and adjustment of non-vascular catheter: Secondary | ICD-10-CM | POA: Diagnosis not present

## 2023-05-11 DIAGNOSIS — J9601 Acute respiratory failure with hypoxia: Secondary | ICD-10-CM | POA: Diagnosis not present

## 2023-05-11 DIAGNOSIS — I7121 Aneurysm of the ascending aorta, without rupture: Secondary | ICD-10-CM | POA: Diagnosis not present

## 2023-05-11 DIAGNOSIS — J9811 Atelectasis: Secondary | ICD-10-CM | POA: Diagnosis not present

## 2023-05-11 DIAGNOSIS — Z452 Encounter for adjustment and management of vascular access device: Secondary | ICD-10-CM | POA: Diagnosis not present

## 2023-05-11 DIAGNOSIS — I480 Paroxysmal atrial fibrillation: Secondary | ICD-10-CM | POA: Diagnosis not present

## 2023-05-11 DIAGNOSIS — I4892 Unspecified atrial flutter: Secondary | ICD-10-CM | POA: Diagnosis not present

## 2023-05-11 DIAGNOSIS — I130 Hypertensive heart and chronic kidney disease with heart failure and stage 1 through stage 4 chronic kidney disease, or unspecified chronic kidney disease: Secondary | ICD-10-CM | POA: Diagnosis not present

## 2023-05-11 DIAGNOSIS — Q2543 Congenital aneurysm of aorta: Secondary | ICD-10-CM | POA: Diagnosis not present

## 2023-05-11 DIAGNOSIS — Z9889 Other specified postprocedural states: Secondary | ICD-10-CM | POA: Diagnosis not present

## 2023-05-11 DIAGNOSIS — N1831 Chronic kidney disease, stage 3a: Secondary | ICD-10-CM | POA: Diagnosis not present

## 2023-05-11 DIAGNOSIS — I5022 Chronic systolic (congestive) heart failure: Secondary | ICD-10-CM | POA: Diagnosis not present

## 2023-05-11 LAB — BASIC METABOLIC PANEL
Anion gap: 6 (ref 5–15)
BUN: 16 mg/dL (ref 6–20)
CO2: 27 mmol/L (ref 22–32)
Calcium: 8.1 mg/dL — ABNORMAL LOW (ref 8.9–10.3)
Chloride: 99 mmol/L (ref 98–111)
Creatinine, Ser: 1.01 mg/dL (ref 0.61–1.24)
GFR, Estimated: >60 mL/min (ref >60)
Glucose, Bld: 105 mg/dL — ABNORMAL HIGH (ref 70–99)
Potassium: 3.9 mmol/L (ref 3.5–5.1)
Sodium: 132 mmol/L — ABNORMAL LOW (ref 135–145)

## 2023-05-11 LAB — CBC
HCT: 34.1 % — ABNORMAL LOW (ref 39.0–52.0)
Hemoglobin: 11.4 g/dL — ABNORMAL LOW (ref 13.0–17.0)
MCH: 30.2 pg (ref 26.0–34.0)
MCHC: 33.4 g/dL (ref 30.0–36.0)
MCV: 90.2 fL (ref 80.0–100.0)
Platelets: 270 10*3/uL (ref 150–400)
RBC: 3.78 MIL/uL — ABNORMAL LOW (ref 4.22–5.81)
RDW: 14.3 % (ref 11.5–15.5)
WBC: 9.1 10*3/uL (ref 4.0–10.5)
nRBC: 0 % (ref 0.0–0.2)

## 2023-05-11 LAB — PROTIME-INR
INR: 1.9 — ABNORMAL HIGH (ref 0.8–1.2)
Prothrombin Time: 21.6 seconds — ABNORMAL HIGH (ref 11.4–15.2)

## 2023-05-11 LAB — GLUCOSE, CAPILLARY
Glucose-Capillary: 113 mg/dL — ABNORMAL HIGH (ref 70–99)
Glucose-Capillary: 133 mg/dL — ABNORMAL HIGH (ref 70–99)

## 2023-05-11 LAB — PHOSPHORUS: Phosphorus: 2.2 mg/dL — ABNORMAL LOW (ref 2.5–4.6)

## 2023-05-11 LAB — MAGNESIUM: Magnesium: 2.1 mg/dL (ref 1.7–2.4)

## 2023-05-11 MED ORDER — POTASSIUM CHLORIDE CRYS ER 10 MEQ PO TBCR: 30.0000 meq | EXTENDED_RELEASE_TABLET | ORAL | Status: DC

## 2023-05-11 MED ORDER — POTASSIUM PHOSPHATES 15 MMOLE/5ML IV SOLN
15.0000 mmol | Freq: Once | INTRAVENOUS | Status: AC
  Administered 2023-05-11: 15 mmol via INTRAVENOUS
  Filled 2023-05-11: qty 5

## 2023-05-11 MED ORDER — WARFARIN SODIUM 2 MG PO TABS
4.0000 mg | ORAL_TABLET | Freq: Once | ORAL | Status: AC
  Administered 2023-05-11: 4 mg via ORAL
  Filled 2023-05-11: qty 2

## 2023-05-11 MED ORDER — LACTULOSE 10 GM/15ML PO SOLN
30.0000 g | Freq: Once | ORAL | Status: AC
  Administered 2023-05-11: 30 g via ORAL
  Filled 2023-05-11: qty 45

## 2023-05-11 NOTE — TOC Initial Note (Signed)
Transition of Care [facility]) - Initial/Assessment Note    Patient Details  Name: Timothy Browning MRN: [id number] Date of Birth: 03-18-1971  Transition of Care [facility]) CM/SW Contact:    Elliot Cousin, RN Phone Number: [phone] 05/11/2023, 4:31 PM  Clinical Narrative:  TOC CM spoke to pt and states he was independent PTA. Has CPAP and AFO at home. Will continue to follow for dc needs.                  Expected Discharge Plan: Home/Self Care Barriers to Discharge: Continued Medical Work up   Patient Goals and CMS Choice Patient states their goals for this hospitalization and ongoing recovery are:: want to recover          Expected Discharge Plan and Services   Discharge Planning Services: CM Consult   Living arrangements for the past 2 months: Single Family Home                   Prior Living Arrangements/Services Living arrangements for the past 2 months: Single Family Home Lives with:: Spouse Patient language and need for interpreter reviewed:: Yes Do you feel safe going back to the place where you live?: Yes      Need for Family Participation in Patient Care: No (Comment) Care giver support system in place?: Yes (comment)   Criminal Activity/Legal Involvement Pertinent to Current Situation/Hospitalization: No - Comment as needed  Activities of Daily Living Home Assistive Devices/Equipment: Eyeglasses ADL Screening (condition at time of admission) Patient's cognitive ability adequate to safely complete daily activities?: Yes Is the patient deaf or have difficulty hearing?: No Does the patient have difficulty seeing, even when wearing glasses/contacts?: No Does the patient have difficulty concentrating, remembering, or making decisions?: No Patient able to express need for assistance with ADLs?: Yes Does the patient have difficulty dressing or bathing?: No Independently performs ADLs?: Yes (appropriate for developmental age) Does the patient have difficulty  walking or climbing stairs?: No Weakness of Legs: Both Weakness of Arms/Hands: None  Permission Sought/Granted Permission sought to share information with : Case Manager, Family Supports Permission granted to share information with : Yes, Verbal Permission Granted  Share Information with NAME: Liliana Cline     Permission granted to share info w Relationship: SO  Permission granted to share info w Contact Information: [phone]  Emotional Assessment Appearance:: Appears stated age Attitude/Demeanor/Rapport: Engaged Affect (typically observed): Accepting Orientation: : Oriented to Self, Oriented to Place, Oriented to  Time, Oriented to Situation   Psych Involvement: No (comment)  Admission diagnosis:  Aortic aneurysm without rupture [facility]) [I71.9] Patient Active Problem List   Diagnosis Date Noted   Acute respiratory failure with hypoxia (HCC) 05/07/2023   Ventilator dependence (HCC) 05/06/2023   Anxiety about health 04/21/2023   Left renal stone 03/25/2023   OSA (obstructive sleep apnea) 01/27/2023   Bilateral ACL tear 12/25/2022   Chronic back pain 09/24/2022   Fatigue 08/27/2022   Chronic systolic congestive heart failure (HCC) 07/16/2022   Aortic aneurysm without rupture (HCC) 07/14/2022   Dizziness 07/03/2022   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 [place] [address] Phone: [phone] Fax: [phone]  [facility] [address] [place] [address] Phone: [phone] Fax: [phone]  [id number] [place], [place] - [address] AT [facility] OF [address]  [address] [place] [address] Phone: [phone] Fax: [phone]     Social Determinants of Health (SDOH) Social History: SDOH Screenings   Food Insecurity: No Food Insecurity (05/06/2023)  Housing: Patient Declined (05/06/2023)  Transportation Needs: No Transportation Needs (05/06/2023)  Utilities: Not At Risk (05/06/2023)  Depression (PHQ2-9): Low Risk  (04/20/2023)  Social Connections: Unknown (03/19/2023)   Received from [facility]  Tobacco Use: Low Risk  (05/06/2023)   SDOH Interventions:     Readmission Risk Interventions     No data to display

## 2023-05-11 NOTE — Progress Notes (Signed)
NAME:  Timothy Browning, MRN:  [id number], DOB:  1971-09-11, LOS: 5 ADMISSION DATE:  05/06/2023, CONSULTATION DATE:  05/06/2023 REFERRING MD:  Cliffton Asters - TCTS, CHIEF COMPLAINT:  Thoracic aortic aneurysm repair  History of Present Illness:  52 year old man with PMHx significant for CKD, GERD, HTN, OSA, aortic root aneurysm who presented for scheduled aneurysm repair. Underwent Bentall procedure on 7/18 which was without complications.  He was transferred post-op to the ICU intubated and sedated.  PCCM consulted for assistance with vent management postoperatively.  Pertinent Medical History:   has a past medical history of Aortic aneurysm without rupture [facility]), Chest pain of uncertain etiology (08/11/2021), CKD (chronic kidney disease), GERD (gastroesophageal reflux disease), Heart failure (HCC), Hypertension, Obesity (BMI 30-39.9) (08/11/2021), and Sleep apnea.  Significant Hospital Events: Including procedures, antibiotic start and stop dates in addition to other pertinent events   7/18 Aortic root aneurysm repair, transferred to ICU intubated and sedated  7/19 POD#1 Extubated, initially had some hypoxia and required heated HFNC 45L, improved this morning after diuresis and back on 3L [place] 7/22 POD#4 O2 needs continue to decrease, down to [facility]. Afib with RVR (120s-130s) while walking this morning, rate now 90s NSR.  Interim History / Subjective:  POD#5 from Bentall procedure No significant events overnight Slept better with CPAP CXR improved, better aeration of LLL on my read No further Afib noted, remains on amio gtt; discuss flecainide resumption Ambulated in hallway last night and this AM, tolerated well O2 requirements down, on RA  Objective:  Blood pressure 118/80, pulse (!) 101, temperature 98.7 F (37.1 C), temperature source Axillary, resp. rate 17, height (P) 6' (1.829 m), weight 107.7 kg, SpO2 93%.        Intake/Output Summary (Last 24 hours) at 05/11/2023 1478 Last data  filed at 05/11/2023 0800 Gross per 24 hour  Intake 1151.3 ml  Output 1200 ml  Net -48.7 ml   Filed Weights   05/09/23 0700 05/10/23 0600 05/11/23 0500  Weight: 104.3 kg 106.7 kg 107.7 kg   Physical Examination: General: Acutely ill-appearing middle-aged man in NAD. Up to chair, pleasant and conversant. HEENT: [place]/AT, anicteric sclera, PERRL, moist mucous membranes. Neck: R internal jugular central access in place. Neuro: Awake, oriented x 4. Responds to verbal stimuli. Following commands consistently. Moves all 4 extremities spontaneously. CV: RRR, no m/g/r. +Mechanical click. Midline sternotomy c/d/i without drainage. PULM: Breathing even and unlabored on RA. Lung fields CTAB. GI: Soft, nontender, nondistended. Normoactive bowel sounds. Extremities: Trace bilateral symmetric LE edema noted. Skin: Warm/dry, no rashes.  Resolved Hospital Problem List:  Post-op ventilator management  Acute Hypoxic Respiratory Failure-improving  Assessment & Plan:  Aortic Root Aneurysm s/p dental procedure with mechanical aortic valve POD#5 from aortic root repair/Bentall procedure with Dr. Cliffton Asters (TCTS). - Postoperative management per TCTS - Multimodal pain control, maximize adjunct medications as able - IS/pulmonary hygiene, OOB/ambulate with assistance - Optimize volume status, goal euvolemia - ASA/statin  Paroxysmal A-fib with RVR - Coumadin for AC - Metoprolol for rate control - Continue amiodarone gtt, may need to consider flecainide resumption - EKG grossly unchanged from prior, borderline long QTc - Cardiology consult per TCTS - Optimize electrolyes (K > 4, Mg > 2) - Cardiac monitoring  AKI on CKD stage 3a, improved Hyponatremia Mild hypokalemia Hypophosphatemia - Trend BMP - Replete electrolytes as indicated, [place] today - Monitor I&Os - Avoid nephrotoxic agents as able - Ensure adequate renal perfusion  Best Practice (right click and "Reselect all SmartList  Selections"  daily)   Diet/type: Regular consistency (see orders) DVT prophylaxis: coumadin GI prophylaxis: PPI Lines: NA Foley: NA Code Status:  full code Last date of multidisciplinary goals of care discussion [Per Primary Team]  Signature:   Faythe Ghee [place] [facility] 05/11/23 8:21 AM  Please see [email or web address] for pager details.  From 7A-7P if no response, please call [phone] After hours, please call ELink [phone]

## 2023-05-11 NOTE — Progress Notes (Signed)
ANTICOAGULATION CONSULT NOTE - Follow-Up  Pharmacy Consult for Warfarin Indication:  mechanical AVR  No Known Allergies  Patient Measurements: Height: (P) 6' (182.9 cm) Weight: 107.7 kg (237 lb 7 oz) IBW/kg (Calculated) : (P) 77.6   Vital Signs: Temp: 98.2 F (36.8 C) (07/23 0745) Temp Source: Oral (07/23 0745) BP: 118/80 (07/23 0800) Pulse Rate: 101 (07/23 0800)  Labs: Recent Labs    05/09/23 0621 05/10/23 0611 05/11/23 0503  HGB 11.9* 11.8* 11.4*  HCT 35.9* 34.8* 34.1*  PLT 199 247 270  LABPROT 21.0* 21.0* 21.6*  INR 1.8* 1.8* 1.9*  CREATININE 1.24 0.88 1.01    Estimated Creatinine Clearance: 109.7 mL/min (by C-G formula based on SCr of 1.01 mg/dL).   Medical History: Past Medical History:  Diagnosis Date   Aortic aneurysm without rupture (HCC)    Chest pain of uncertain etiology 08/11/2021   CKD (chronic kidney disease)    GERD (gastroesophageal reflux disease)    Heart failure (HCC)    Hypertension    Obesity (BMI 30-39.9) 08/11/2021   Sleep apnea     Assessment: 52yom s/p mechanical AVR started on warfarin per pharmacy. INR today is 1.9. Pt remains on amiodarone IV infusion and has poor appetite. Pt last bowel movement was prior to admission. Ordered lactulose this AM and will monitor appetite improvement with this.  Goal of Therapy:  INR 2-3 Monitor platelets by anticoagulation protocol: Yes   Plan:  Warfarin 4 mg PO x1 tonight Daily INR  [name], PharmD PGY2 Cardiology Pharmacy Resident  Please check AMION for all [facility] Pharmacy numbers 05/11/2023
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
## 2023-05-11 NOTE — Progress Notes (Addendum)
TCTS DAILY ICU PROGRESS NOTE                   [address]            [place] [address]          [phone]   5 Days Post-Op Procedure(s) (LRB): BENTALL PROCEDURE USING SJM AORTIC VALVED GRAFT SIZE (N/A) TRANSESOPHAGEAL ECHOCARDIOGRAM (N/A)  Total Length of Stay:  LOS: 5 days   Subjective: Having some of shortness of breath along with sensation of fluttering in his chest similar to when he was having atrial fibrillation prior to surgery.  Says he rested better last night with his CPAP machine.  Currently on RA with sat 93% Walked in the hall this morning.  Passing gas but no BM since surgery.  Objective: Vital signs in last 24 hours: Temp:  [97.7 F (36.5 C)-99.1 F (37.3 C)] 98.7 F (37.1 C) (07/23 0340) Pulse Rate:  [84-136] 94 (07/23 0600) Cardiac Rhythm: Sinus tachycardia (07/23 0400) Resp:  [15-30] 16 (07/23 0600) BP: (105-133)/(56-101) 113/86 (07/23 0600) SpO2:  [90 %-97 %] 94 % (07/23 0600) Weight:  [107.7 kg] 107.7 kg (07/23 0500)  Filed Weights   05/09/23 0700 05/10/23 0600 05/11/23 0500  Weight: 104.3 kg 106.7 kg 107.7 kg    Weight change: 1 kg   Hemodynamic parameters for last 24 hours:    Intake/Output from previous day: 07/22 0701 - 07/23 0700 In: 1084.7 [P.O.:200; I.V.:838.3; IV Piggyback:46.4] Out: 1200 [Urine:1200]  Intake/Output this shift: No intake/output data recorded.  Current Meds: Scheduled Meds:  acetaminophen (TYLENOL) oral liquid 160 mg/5 mL  650 mg Per Tube Once   acetaminophen  1,000 mg Oral Q6H   aspirin EC  81 mg Oral Daily   bisacodyl  10 mg Oral Daily   Or   bisacodyl  10 mg Rectal Daily   Chlorhexidine Gluconate Cloth  6 each Topical Daily   docusate sodium  200 mg Oral Daily   insulin aspart  0-24 Units Subcutaneous Q4H   lidocaine  1 patch Transdermal q1800   metoprolol tartrate  25 mg Oral BID   pantoprazole  40 mg Oral Daily   rosuvastatin  5 mg Oral QHS   Warfarin - Pharmacist Dosing  Inpatient   Does not apply q1600   Continuous Infusions:  albumin human 999 mL/hr at 05/10/23 1700   amiodarone 60 mg/hr (05/11/23 0600)   PRN Meds:.albumin human, gabapentin, methocarbamol, metoprolol tartrate, morphine injection, ondansetron (ZOFRAN) IV, mouth rinse, oxyCODONE, traMADol  General appearance: alert, cooperative, and no distress Heart: regular rhythm, HR 100-110 Lungs: breath sounds are clear Abdomen: soft, no tenderness, active bowel sounds Extremities: no edema Wound: sternal incision intact and healing well  Lab Results: CBC: Recent Labs    05/10/23 0611 05/11/23 0503  WBC 9.2 9.1  HGB 11.8* 11.4*  HCT 34.8* 34.1*  PLT 247 270   BMET:  Recent Labs    05/10/23 0611 05/11/23 0503  NA 134* 132*  K 3.7 3.9  CL 100 99  CO2 28 27  GLUCOSE 97 105*  BUN 20 16  CREATININE 0.88 1.01  CALCIUM 7.9* 8.1*    CMET: Lab Results  Component Value Date   WBC 9.1 05/11/2023   HGB 11.4 (L) 05/11/2023   HCT 34.1 (L) 05/11/2023   PLT 270 05/11/2023   GLUCOSE 105 (H) 05/11/2023   CHOL 203 (H) 07/14/2022   TRIG 70 07/14/2022   HDL 57 07/14/2022  LDLCALC 132 (H) 07/14/2022   ALT 26 05/07/2023   AST 52 (H) 05/07/2023   NA 132 (L) 05/11/2023   K 3.9 05/11/2023   CL 99 05/11/2023   CREATININE 1.01 05/11/2023   BUN 16 05/11/2023   CO2 27 05/11/2023   TSH 2.196 07/14/2022   INR 1.9 (H) 05/11/2023   HGBA1C 5.4 05/04/2023      PT/INR:  Recent Labs    05/11/23 0503  LABPROT 21.6*  INR 1.9*   Radiology: No results found.   Assessment/Plan: S/P Procedure(s) (LRB): BENTALL PROCEDURE USING SJM AORTIC VALVED GRAFT SIZE (N/A) TRANSESOPHAGEAL ECHOCARDIOGRAM (N/A)  -POD#5 Bentall procedure, mechanical AVR. BP stable. INR 1.9, Coumadin dosing per pharmacy.  -History of atrial fibrillation / flutter- on flecainide prior to admission. Currently on amiodarone at 1mg /min. In a regular rhythm but not clearly SR. Will check EKG, consult cardiology to assist  with management. Replete K+.  -PULM- O2 sats reasonable on RA. CXR showing diminished aeration in RUL. Continiue working on pulmonary hygiene, uses [facility] at night for h/o OSA.  -GI- tolerating PO's, no BM since surgery but abd benign. Lactulose today.   -RENAL- Stage 3  CKD on admission. Creat currently 1.01, No peripheral edema, Wt 1kg+.   -ENDO- no h/o DM, CBG's requiring no coverage. Will stop CBG's ans SSI.  -DVT PPX- INR approaching therapeutic, ambulating.   -Keep in ICU while evaluating cardiac rhythm.   Leary Roca PA-C Pager [phone] 05/11/2023 7:49 AM  Agree with above Titrating meds  Floor soon  Toney Difatta Keane Scrape

## 2023-05-12 DIAGNOSIS — I7121 Aneurysm of the ascending aorta, without rupture: Secondary | ICD-10-CM | POA: Diagnosis not present

## 2023-05-12 DIAGNOSIS — I480 Paroxysmal atrial fibrillation: Secondary | ICD-10-CM

## 2023-05-12 DIAGNOSIS — J9601 Acute respiratory failure with hypoxia: Secondary | ICD-10-CM | POA: Diagnosis not present

## 2023-05-12 DIAGNOSIS — I4891 Unspecified atrial fibrillation: Secondary | ICD-10-CM | POA: Insufficient documentation

## 2023-05-12 LAB — GLUCOSE, CAPILLARY: Glucose-Capillary: 88 mg/dL (ref 70–99)

## 2023-05-12 LAB — BASIC METABOLIC PANEL
Anion gap: 13 (ref 5–15)
BUN: 17 mg/dL (ref 6–20)
CO2: 26 mmol/L (ref 22–32)
Calcium: 7.7 mg/dL — ABNORMAL LOW (ref 8.9–10.3)
Chloride: 90 mmol/L — ABNORMAL LOW (ref 98–111)
Creatinine, Ser: 1.11 mg/dL (ref 0.61–1.24)
GFR, Estimated: >60 mL/min (ref >60)
Glucose, Bld: 347 mg/dL — ABNORMAL HIGH (ref 70–99)
Potassium: 4.9 mmol/L (ref 3.5–5.1)
Sodium: 129 mmol/L — ABNORMAL LOW (ref 135–145)

## 2023-05-12 LAB — CBC
HCT: 30.7 % — ABNORMAL LOW (ref 39.0–52.0)
Hemoglobin: 10.3 g/dL — ABNORMAL LOW (ref 13.0–17.0)
MCH: 30.1 pg (ref 26.0–34.0)
MCHC: 33.6 g/dL (ref 30.0–36.0)
MCV: 89.8 fL (ref 80.0–100.0)
Platelets: 290 10*3/uL (ref 150–400)
RBC: 3.42 MIL/uL — ABNORMAL LOW (ref 4.22–5.81)
RDW: 14.6 % (ref 11.5–15.5)
WBC: 8.4 10*3/uL (ref 4.0–10.5)
nRBC: 0 % (ref 0.0–0.2)

## 2023-05-12 LAB — PROTIME-INR
INR: 2 — ABNORMAL HIGH (ref 0.8–1.2)
Prothrombin Time: 22.9 seconds — ABNORMAL HIGH (ref 11.4–15.2)

## 2023-05-12 LAB — MAGNESIUM: Magnesium: 1.6 mg/dL — ABNORMAL LOW (ref 1.7–2.4)

## 2023-05-12 MED ORDER — SODIUM CHLORIDE 0.9 % IV SOLN: 250.0000 mL | INTRAVENOUS | Status: DC | PRN

## 2023-05-12 MED ORDER — ~~LOC~~ CARDIAC SURGERY, PATIENT & FAMILY EDUCATION: Freq: Once | Status: DC

## 2023-05-12 MED ORDER — AMIODARONE HCL 200 MG PO TABS
400.0000 mg | ORAL_TABLET | Freq: Two times a day (BID) | ORAL | Status: DC
  Administered 2023-05-12 – 2023-05-14 (×5): 400 mg via ORAL
  Filled 2023-05-12 (×5): qty 2

## 2023-05-12 MED ORDER — SODIUM CHLORIDE 0.9% FLUSH: 3.0000 mL | INTRAVENOUS | Status: DC | PRN

## 2023-05-12 MED ORDER — METOPROLOL SUCCINATE ER 25 MG PO TB24
25.0000 mg | ORAL_TABLET | Freq: Two times a day (BID) | ORAL | Status: DC
  Administered 2023-05-12 – 2023-05-14 (×5): 25 mg via ORAL
  Filled 2023-05-12 (×5): qty 1

## 2023-05-12 MED ORDER — SODIUM CHLORIDE 0.9% FLUSH
3.0000 mL | Freq: Two times a day (BID) | INTRAVENOUS | Status: DC
  Administered 2023-05-12 – 2023-05-14 (×5): 3 mL via INTRAVENOUS

## 2023-05-12 MED ORDER — WARFARIN SODIUM 2.5 MG PO TABS
2.5000 mg | ORAL_TABLET | Freq: Once | ORAL | Status: AC
  Administered 2023-05-12: 2.5 mg via ORAL
  Filled 2023-05-12: qty 1

## 2023-05-12 NOTE — TOC Progression Note (Signed)
Transition of Care [facility]) - Progression Note    Patient Details  Name: Timothy Browning MRN: [id number] Date of Birth: [birth year]  [facility]) CM/SW Contact  Elliot Cousin, RN Phone Number: [phone] 05/12/2023, 1:38 PM  Clinical Narrative:   TOC CM spoke to pt and requested RW with seat. Contacted Emily Filbert for [employer].   Expected Discharge Plan: Home/Self Care Barriers to Discharge: Continued Medical Work up  Expected Discharge Plan and Services   Discharge Planning Services: CM Consult   Living arrangements for the past 2 months: Single Family Home                   Social Determinants of Health (SDOH) Interventions SDOH Screenings   Food Insecurity: No Food Insecurity (05/06/2023)  Housing: Patient Declined (05/06/2023)  Transportation Needs: No Transportation Needs (05/06/2023)  Utilities: Not At Risk (05/06/2023)  Depression (PHQ2-9): Low Risk  (04/20/2023)  Social Connections: Unknown (03/19/2023)   Received from [facility]  Tobacco Use: Low Risk  (05/06/2023)    Readmission Risk Interventions     No data to display

## 2023-05-12 NOTE — Progress Notes (Signed)
Mobility Specialist Progress Note:   05/12/23 1600  Mobility  Activity Ambulated with assistance in hallway  Level of Assistance Contact guard assist, steadying assist  Assistive Device Four wheel walker  Distance Ambulated (ft) 740 ft  Activity Response Tolerated well  Mobility Referral Yes  $Mobility charge 1 Mobility  Mobility Specialist Start Time (ACUTE ONLY) 1559  Mobility Specialist Stop Time (ACUTE ONLY) 1612  Mobility Specialist Time Calculation (min) (ACUTE ONLY) 13 min    Pre Mobility: 105 HR During Mobility: 123 HR Post Mobility:  110 HR  Pt received in bed, agreeable to mobility. C/o some SOB, requiring x1 standing rest break. Otherwise asymptomatic. Pt left of EOB with call bell and family present.  D'Vante Earlene Plater Mobility Specialist Please contact via Special educational needs teacher or Rehab office at [phone]

## 2023-05-12 NOTE — Progress Notes (Signed)
NAME:  Timothy Browning, MRN:  [id number], DOB:  14-Dec-1970, LOS: 6 ADMISSION DATE:  05/06/2023, CONSULTATION DATE:  05/06/2023 REFERRING MD:  Cliffton Asters - TCTS, CHIEF COMPLAINT:  Thoracic aortic aneurysm repair  History of Present Illness:  52 year old man with PMHx significant for CKD, GERD, HTN, OSA, aortic root aneurysm who presented for scheduled aneurysm repair. Underwent Bentall procedure on 7/18 which was without complications.  He was transferred post-op to the ICU intubated and sedated.  PCCM consulted for assistance with vent management postoperatively.  Pertinent Medical History:   has a past medical history of Aortic aneurysm without rupture [facility]), Chest pain of uncertain etiology (08/11/2021), CKD (chronic kidney disease), GERD (gastroesophageal reflux disease), Heart failure (HCC), Hypertension, Obesity (BMI 30-39.9) (08/11/2021), and Sleep apnea.  Significant Hospital Events: Including procedures, antibiotic start and stop dates in addition to other pertinent events   7/18 Aortic root aneurysm repair, transferred to ICU intubated and sedated  7/19 POD#1 Extubated, initially had some hypoxia and required heated HFNC 45L, improved this morning after diuresis and back on 3L [place] 7/22 POD#4 O2 needs continue to decrease, down to [facility]. Afib with RVR (120s-130s) while walking this morning, rate now 90s NSR. 7/24 transitioning from amio infusion to po and plan to transfer out of ICU  Interim History / Subjective:  POD#7 from Bentall procedure Transition out of ICU delayed by O2 demand and atrial fibrillation, both of which are improving  He has been ambulating, still has some intermittent chest pain  Objective:  Blood pressure 130/85, pulse 99, temperature 98.6 F (37 C), temperature source Oral, resp. rate (!) 24, height (P) 6' (1.829 m), weight 108.2 kg, SpO2 96%.        Intake/Output Summary (Last 24 hours) at 05/12/2023 1046 Last data filed at 05/12/2023 0700 Gross per 24 hour   Intake 1002.09 ml  Output --  Net 1002.09 ml   Filed Weights   05/10/23 0600 05/11/23 0500 05/12/23 0500  Weight: 106.7 kg 107.7 kg 108.2 kg      General:  well-nourished M sitting up in chair in no acute distress HEENT: MM pink/moist Neuro: alert and oriented  CV: s1s2 rrr, no m/r/g PULM:  clear bilaterally without tachypnea or accessory muscle use, midline sternotomy dressed without drainage GI: soft, bsx4 active  Extremities: warm/dry, no edema  Skin: no rashes or lesions   Labs: INR 2.0 Na 129 Hgb 10.3 Mag 1.6 Glu 88  Resolved Hospital Problem List:  Post-op ventilator management  Acute Hypoxic Respiratory Failure-improving  Assessment & Plan:  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 Cliffton Asters (TCTS). - Postoperative management per TCTS - Multimodal pain control, maximize adjunct medications as able - IS/pulmonary hygiene, OOB/ambulate with assistance - Optimize volume status, goal euvolemia - ASA/statin -plan to transition out of ICU today  Paroxysmal A-fib with RVR - Coumadin for AC - Metoprolol for rate control - Amiodarone transitioning to po today and plan to resume Flecainide at discharge, cardiology following  - Optimize electrolyes (K > 4, Mg > 2) - Cardiac monitoring  AKI on CKD stage 3a, improved Hyponatremia Mild hypokalemia Hypophosphatemia - Trend BMP - Replete electrolytes as indicated, Kphos today - Monitor I&Os - Avoid nephrotoxic agents as able - Ensure adequate renal perfusion  Best Practice (right click and "Reselect all SmartList Selections" daily)   Diet/type: Regular consistency (see orders) DVT prophylaxis: coumadin GI prophylaxis: PPI Lines: central line to be removed today  Foley: NA Code Status:  full code Last date of multidisciplinary goals of care discussion [Per Primary Team]  Signature:   Darcella Gasman Lainy Wrobleski,  PA-C [facility] See Amion for pager If no response to pager , please call [phone] until 7pm After 7:00 pm call Elink  [phone]

## 2023-05-12 NOTE — Progress Notes (Addendum)
TCTS DAILY ICU PROGRESS NOTE                   [address]            [place] [address]          [phone]   6 Days Post-Op Procedure(s) (LRB): BENTALL PROCEDURE USING SJM AORTIC VALVED GRAFT SIZE (N/A) TRANSESOPHAGEAL ECHOCARDIOGRAM (N/A)  Total Length of Stay:  LOS: 6 days   Subjective:  Up in the bedside chair, says breathing is better. Currently on RA with sat 96% Progressing with mobility.  BM yesterday Amiodarone infusing through right internal jugular line.   Objective: Vital signs in last 24 hours: Temp:  [98.2 F (36.8 C)-98.6 F (37 C)] 98.3 F (36.8 C) (07/24 0411) Pulse Rate:  [83-113] 102 (07/24 0720) Cardiac Rhythm: Normal sinus rhythm;Sinus tachycardia (07/24 0400) Resp:  [16-28] 24 (07/24 0720) BP: (105-137)/(48-105) 137/90 (07/24 0700) SpO2:  [90 %-98 %] 96 % (07/24 0720) Weight:  [108.2 kg] 108.2 kg (07/24 0500)  Filed Weights   05/10/23 0600 05/11/23 0500 05/12/23 0500  Weight: 106.7 kg 107.7 kg 108.2 kg    Weight change: 0.5 kg     Intake/Output from previous day: 07/23 0701 - 07/24 0700 In: 1068.7 [I.V.:814.4; IV Piggyback:254.4] Out: -   Intake/Output this shift: No intake/output data recorded.  Current Meds: Scheduled Meds:  acetaminophen (TYLENOL) oral liquid 160 mg/5 mL  650 mg Per Tube Once   aspirin EC  81 mg Oral Daily   bisacodyl  10 mg Oral Daily   Or   bisacodyl  10 mg Rectal Daily   Chlorhexidine Gluconate Cloth  6 each Topical Daily   docusate sodium  200 mg Oral Daily   lidocaine  1 patch Transdermal q1800   metoprolol tartrate  25 mg Oral BID   pantoprazole  40 mg Oral Daily   rosuvastatin  5 mg Oral QHS   Warfarin - Pharmacist Dosing Inpatient   Does not apply q1600   Continuous Infusions:  albumin human 999 mL/hr at 05/12/23 0700   amiodarone 60 mg/hr (05/12/23 0700)   PRN Meds:.albumin human, gabapentin, methocarbamol, metoprolol tartrate, morphine injection, ondansetron (ZOFRAN)  IV, mouth rinse, oxyCODONE, traMADol  General appearance: alert, cooperative, and no distress Heart: regular rhythm, HR 100-110 Lungs: breath sounds are clear Abdomen: soft, no tenderness, active bowel sounds Extremities: no edema Wound: sternal incision intact and healing well  Lab Results: CBC: Recent Labs    05/11/23 0503 05/12/23 0355  WBC 9.1 8.4  HGB 11.4* 10.3*  HCT 34.1* 30.7*  PLT 270 290   BMET:  Recent Labs    05/11/23 0503 05/12/23 0355  NA 132* 129*  K 3.9 4.9  CL 99 90*  CO2 27 26  GLUCOSE 105* 347*  BUN 16 17  CREATININE 1.01 1.11  CALCIUM 8.1* 7.7*    CMET: Lab Results  Component Value Date   WBC 8.4 05/12/2023   HGB 10.3 (L) 05/12/2023   HCT 30.7 (L) 05/12/2023   PLT 290 05/12/2023   GLUCOSE 347 (H) 05/12/2023   CHOL 203 (H) 07/14/2022   TRIG 70 07/14/2022   HDL 57 07/14/2022   LDLCALC 132 (H) 07/14/2022   ALT 26 05/07/2023   AST 52 (H) 05/07/2023   NA 129 (L) 05/12/2023   K 4.9 05/12/2023   CL 90 (L) 05/12/2023   CREATININE 1.11 05/12/2023   BUN 17 05/12/2023   CO2 26 05/12/2023  TSH 2.196 07/14/2022   INR 2.0 (H) 05/12/2023   HGBA1C 5.4 05/04/2023      PT/INR:  Recent Labs    05/12/23 0355  LABPROT 22.9*  INR 2.0*   Radiology: No results found.   Assessment/Plan: S/P Procedure(s) (LRB): BENTALL PROCEDURE USING SJM AORTIC VALVED GRAFT SIZE (N/A) TRANSESOPHAGEAL ECHOCARDIOGRAM (N/A)  -POD#6 Bentall procedure, mechanical AVR. BP stable. INR 2.0, Coumadin dosing per pharmacy.  -History PVC's and PAC's- on flecainide prior to admission. Currently on amiodarone at 1mg /min. Appreciate input from  cardiology.  Will convert to oral amiodarone with plans to transition back to flecainide in the outpatient setting.  -PULM- O2 sats reasonable on RA. Continiue working on pulmonary hygiene, uses [facility] at night for h/o OSA.  -GI- tolerating PO's, BM yesterday  -RENAL- Stage 3  CKD on admission. Creat currently 1.1 No  peripheral edema, Wt 1kg+. Monitor.  -ENDO- no h/o DM.  Glucose on BMP inaccurate- was drawn from central line with amio in dextrose infusing.  -DVT PPX- INR  therapeutic, ambulating.   -Transfer to 4E today. Transition to oral amio. Work on mobility. Anticipate he will be ready for discharge to home in 1-2 days.    Leary Roca PA-C Pager [phone] 05/12/2023 7:42 AM  Agree with above Titrating meds  Cinthya Bors Keane Scrape

## 2023-05-12 NOTE — Discharge Instructions (Addendum)
Discharge Instructions:  1. You may shower, please wash incisions daily with soap and water and keep dry.  If you wish to cover wounds with dressing you may do so but please keep clean and change daily.  No tub baths or swimming until incisions have completely healed.  If your incisions become red or develop any drainage please call our office at [phone]  2. No Driving until cleared by Dr. Lightfoot's office and you are no longer using narcotic pain medications  3. Monitor your weight daily.. Please use the same scale and weigh at same time... If you gain 5-10 lbs in 48 hours with associated lower extremity swelling, please contact our office at [phone]  4. Fever of 101.5 for at least 24 hours with no source, please contact our office at [phone]  5. Activity- up as tolerated, please walk at least 3 times per day.  Avoid strenuous activity, no lifting, pushing, or pulling with your arms over 8-10 lbs for a minimum of 6 weeks  6. If any questions or concerns arise, please do not hesitate to contact our office at [phone]    Information on my medicine - Coumadin   (Warfarin)  Why was Coumadin prescribed for you? Coumadin was prescribed for you because you have a blood clot or a medical condition that can cause an increased risk of forming blood clots. Blood clots can cause serious health problems by blocking the flow of blood to the heart, lung, or brain. Coumadin can prevent harmful blood clots from forming. As a reminder your indication for Coumadin is:  Blood Clot Prevention after Heart Valve Surgery  What test will check on my response to Coumadin? While on Coumadin (warfarin) you will need to have an INR test regularly to ensure that your dose is keeping you in the desired range. The INR (international normalized ratio) number is calculated from the result of the laboratory test called prothrombin time (PT).  If an INR APPOINTMENT HAS NOT ALREADY BEEN MADE FOR YOU please  schedule an appointment to have this lab work done by your health care provider within 7 days. Your INR goal is usually a number between:  2 to 3 or your provider may give you a more narrow range like 2-2.5.  Ask your health care provider during an office visit what your goal INR is.  What  do you need to  know  About  COUMADIN? Take Coumadin (warfarin) exactly as prescribed by your healthcare provider about the same time each day.  DO NOT stop taking without talking to the doctor who prescribed the medication.  Stopping without other blood clot prevention medication to take the place of Coumadin may increase your risk of developing a new clot or stroke.  Get refills before you run out.  What do you do if you miss a dose? If you miss a dose, take it as soon as you remember on the same day then continue your regularly scheduled regimen the next day.  Do not take two doses of Coumadin at the same time.  Important Safety Information A possible side effect of Coumadin (Warfarin) is an increased risk of bleeding. You should call your healthcare provider right away if you experience any of the following: Bleeding from an injury or your nose that does not stop. Unusual colored urine (red or dark brown) or unusual colored stools (red or black). Unusual bruising for unknown reasons. A serious fall or if you hit your head (even if there is no   bleeding).  Some foods or medicines interact with Coumadin (warfarin) and might alter your response to warfarin. To help avoid this: Eat a balanced diet, maintaining a consistent amount of Vitamin K. Notify your provider about major diet changes you plan to make. Avoid alcohol or limit your intake to 1 drink for women and 2 drinks for men per day. (1 drink is 5 oz. wine, 12 oz. beer, or 1.5 oz. liquor.)  Make sure that ANY health care provider who prescribes medication for you knows that you are taking Coumadin (warfarin).  Also make sure the healthcare provider  who is monitoring your Coumadin knows when you have started a new medication including herbals and non-prescription products.  Coumadin (Warfarin)  Major Drug Interactions  Increased Warfarin Effect Decreased Warfarin Effect  Alcohol (large quantities) Antibiotics (esp. Septra/Bactrim, Flagyl, Cipro) Amiodarone (Cordarone) Aspirin (ASA) Cimetidine (Tagamet) Megestrol (Megace) NSAIDs (ibuprofen, naproxen, etc.) Piroxicam (Feldene) Propafenone (Rythmol SR) Propranolol (Inderal) Isoniazid (INH) Posaconazole (Noxafil) Barbiturates (Phenobarbital) Carbamazepine (Tegretol) Chlordiazepoxide (Librium) Cholestyramine (Questran) Griseofulvin Oral Contraceptives Rifampin Sucralfate (Carafate) Vitamin K   Coumadin (Warfarin) Major Herbal Interactions  Increased Warfarin Effect Decreased Warfarin Effect  Garlic Ginseng Ginkgo biloba Coenzyme Q10 Green tea St. John's wort    Coumadin (Warfarin) FOOD Interactions  Eat a consistent number of servings per week of foods HIGH in Vitamin K (1 serving =  cup)  Collards (cooked, or boiled & drained) Kale (cooked, or boiled & drained) Mustard greens (cooked, or boiled & drained) Parsley *serving size only =  cup Spinach (cooked, or boiled & drained) Swiss chard (cooked, or boiled & drained) Turnip greens (cooked, or boiled & drained)  Eat a consistent number of servings per week of foods MEDIUM-HIGH in Vitamin K (1 serving = 1 cup)  Asparagus (cooked, or boiled & drained) Broccoli (cooked, boiled & drained, or raw & chopped) Brussel sprouts (cooked, or boiled & drained) *serving size only =  cup Lettuce, raw (green leaf, endive, romaine) Spinach, raw Turnip greens, raw & chopped   These websites have more information on Coumadin (warfarin):  www.coumadin.com; www.ahrq.gov/consumer/coumadin.htm;   

## 2023-05-12 NOTE — Progress Notes (Signed)
Progress Note  Patient Name: Timothy Browning Date of Encounter: 05/12/2023  Primary Cardiologist: Thomasene Ripple, DO   Subjective   Patient seen and examined at his bedside.   Inpatient Medications    Scheduled Meds:  acetaminophen (TYLENOL) oral liquid 160 mg/5 mL  650 mg Per Tube Once   amiodarone  400 mg Oral BID   aspirin EC  81 mg Oral Daily   bisacodyl  10 mg Oral Daily   Or   bisacodyl  10 mg Rectal Daily   Chlorhexidine Gluconate Cloth  6 each Topical Daily   [place] [facility], Patient & Family Education   Does not apply Once   docusate sodium  200 mg Oral Daily   lidocaine  1 patch Transdermal q1800   metoprolol tartrate  25 mg Oral BID   pantoprazole  40 mg Oral Daily   rosuvastatin  5 mg Oral QHS   sodium chloride flush  3 mL Intravenous Q12H   Warfarin - Pharmacist Dosing Inpatient   Does not apply q1600   Continuous Infusions:  sodium chloride     albumin human 999 mL/hr at 05/12/23 0700   amiodarone 60 mg/hr (05/12/23 0700)   PRN Meds: sodium chloride, albumin human, gabapentin, methocarbamol, metoprolol tartrate, morphine injection, ondansetron (ZOFRAN) IV, mouth rinse, oxyCODONE, sodium chloride flush, traMADol   Vital Signs    Vitals:   05/12/23 0500 05/12/23 0600 05/12/23 0700 05/12/23 0720  BP: 130/76 117/77 (!) 137/90   Pulse: 94 97 (!) 113 (!) 102  Resp: 17 18 (!) 27 (!) 24  Temp:      TempSrc:      SpO2: 98% 96% 90% 96%  Weight: 108.2 kg     Height:        Intake/Output Summary (Last 24 hours) at 05/12/2023 0834 Last data filed at 05/12/2023 0700 Gross per 24 hour  Intake 1002.09 ml  Output --  Net 1002.09 ml   Filed Weights   05/10/23 0600 05/11/23 0500 05/12/23 0500  Weight: 106.7 kg 107.7 kg 108.2 kg    Telemetry     - Personally Reviewed  ECG    None today - Personally Reviewed  Physical Exam    General: Comfortable, sitting up in a chair Head: Atraumatic, normal size  Eyes: PEERLA, EOMI  Neck: Supple,  normal JVD, RIJ catheter in place Chest: Wound clean and dry Cardiac: Normal S1, S2; RRR; no murmurs, rubs, or gallops Lungs: Clear to auscultation bilaterally Abd: Soft, nontender, no hepatomegaly  Ext: warm, no edema Musculoskeletal: No deformities, BUE and BLE strength normal and equal Skin: Warm and dry, no rashes   Neuro: Alert and oriented to person, place, time, and situation, CNII-XII grossly intact, no focal deficits  Psych: Normal mood and affect   Labs    Chemistry Recent Labs  Lab 05/07/23 0155 05/07/23 0949 05/10/23 0611 05/11/23 0503 05/12/23 0355  NA 133*   < > 134* 132* 129*  K 4.0   < > 3.7 3.9 4.9  CL 103   < > 100 99 90*  CO2 22   < > 28 27 26   GLUCOSE 117*   < > 97 105* 347*  BUN 16   < > 20 16 17   CREATININE 1.25*   < > 0.88 1.01 1.11  CALCIUM 7.5*   < > 7.9* 8.1* 7.7*  PROT 5.4*  --   --   --   --   ALBUMIN 3.3*  --   --   --   --  AST 52*  --   --   --   --   ALT 26  --   --   --   --   ALKPHOS 37*  --   --   --   --   BILITOT 1.2  --   --   --   --   GFRNONAA >60   < > >60 >60 >60  ANIONGAP 8   < > 6 6 13    < > = values in this interval not displayed.     Hematology Recent Labs  Lab 05/10/23 0611 05/11/23 0503 05/12/23 0355  WBC 9.2 9.1 8.4  RBC 3.85* 3.78* 3.42*  HGB 11.8* 11.4* 10.3*  HCT 34.8* 34.1* 30.7*  MCV 90.4 90.2 89.8  MCH 30.6 30.2 30.1  MCHC 33.9 33.4 33.6  RDW 13.9 14.3 14.6  PLT 247 270 290    Cardiac EnzymesNo results for input(s): "TROPONINI" in the last 168 hours. No results for input(s): "TROPIPOC" in the last 168 hours.   BNPNo results for input(s): "BNP", "PROBNP" in the last 168 hours.   DDimer No results for input(s): "DDIMER" in the last 168 hours.   Radiology    DG Chest 1 View  Result Date: 05/11/2023 CLINICAL DATA:  Postoperative state, evaluate atelectasis seen in the previous study EXAM: CHEST  1 VIEW COMPARISON:  Previous studies including the examination of 05/08/2023 FINDINGS: Transverse  diameter of heart is increased. There is interval removal of left chest tube and mediastinal drain. Prosthetic aortic valve is seen. Tip of right IJ venous catheter is seen in the course of right innominate vein. There is improvement in the aeration in the lower lung fields. Residual linear densities are seen in the lower lung fields. There is blunting of both lateral CP angles. There is no pneumothorax. IMPRESSION: There is interval improvement in aeration in the lower lung fields suggesting decrease in atelectasis and decrease in small bilateral pleural effusions. There are no signs of alveolar pulmonary edema or new focal infiltrates. Electronically Signed   By: Ernie Avena M.D.   On: 05/11/2023 10:15    Cardiac Studies   Echo reviewed  Patient Profile     52 y.o. male with a hx of aortic thoracic aneurysm on CTA measuring 5.1 cm, HTN, HLD, CKD, HFmrEF.  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  Plan to convert to p.o. in the a.m.  Continue his metroprolol but will switch to metoprolol succinate 25 mg twice a day instead of metoprolol tartrate.  Amiodarone short-term giving significant long-term side effects from amiodarone at 52 I would not like to keep him on his medication for a long time.  This will be discussed in the outpatient setting.   Anticoagulation per pharmacy.   Clinically he appears to be euvolemic, off diuretics will continue to monitor him clinically if need for any further cautious diuretics.   Blood pressure is acceptable, continue with current antihypertensive regimen.      For questions or updates, please contact [facility] Please consult [email or web address] for contact  info under Cardiology/STEMI.      Osvaldo Shipper, DO  05/12/2023, 8:34 AM

## 2023-05-13 DIAGNOSIS — N179 Acute kidney failure, unspecified: Secondary | ICD-10-CM | POA: Diagnosis not present

## 2023-05-13 DIAGNOSIS — J9811 Atelectasis: Secondary | ICD-10-CM | POA: Diagnosis not present

## 2023-05-13 DIAGNOSIS — I130 Hypertensive heart and chronic kidney disease with heart failure and stage 1 through stage 4 chronic kidney disease, or unspecified chronic kidney disease: Secondary | ICD-10-CM | POA: Diagnosis not present

## 2023-05-13 DIAGNOSIS — E871 Hypo-osmolality and hyponatremia: Secondary | ICD-10-CM | POA: Diagnosis not present

## 2023-05-13 DIAGNOSIS — I4892 Unspecified atrial flutter: Secondary | ICD-10-CM | POA: Diagnosis not present

## 2023-05-13 DIAGNOSIS — N1831 Chronic kidney disease, stage 3a: Secondary | ICD-10-CM | POA: Diagnosis not present

## 2023-05-13 DIAGNOSIS — I442 Atrioventricular block, complete: Secondary | ICD-10-CM | POA: Diagnosis not present

## 2023-05-13 DIAGNOSIS — Q2543 Congenital aneurysm of aorta: Secondary | ICD-10-CM | POA: Diagnosis not present

## 2023-05-13 DIAGNOSIS — J9601 Acute respiratory failure with hypoxia: Secondary | ICD-10-CM | POA: Diagnosis not present

## 2023-05-13 DIAGNOSIS — I480 Paroxysmal atrial fibrillation: Secondary | ICD-10-CM | POA: Diagnosis not present

## 2023-05-13 DIAGNOSIS — I5022 Chronic systolic (congestive) heart failure: Secondary | ICD-10-CM | POA: Diagnosis not present

## 2023-05-13 LAB — BASIC METABOLIC PANEL
Anion gap: 8 (ref 5–15)
CO2: 28 mmol/L (ref 22–32)
Calcium: 8.3 mg/dL — ABNORMAL LOW (ref 8.9–10.3)
Chloride: 95 mmol/L — ABNORMAL LOW (ref 98–111)
Glucose, Bld: 125 mg/dL — ABNORMAL HIGH (ref 70–99)
Potassium: 4 mmol/L (ref 3.5–5.1)
Sodium: 131 mmol/L — ABNORMAL LOW (ref 135–145)

## 2023-05-13 LAB — CBC
HCT: 32.5 % — ABNORMAL LOW (ref 39.0–52.0)
Hemoglobin: 10.9 g/dL — ABNORMAL LOW (ref 13.0–17.0)
MCH: 30.2 pg (ref 26.0–34.0)
MCV: 90 fL (ref 80.0–100.0)
Platelets: 341 10*3/uL (ref 150–400)
RDW: 14.6 % (ref 11.5–15.5)
WBC: 10.9 10*3/uL — ABNORMAL HIGH (ref 4.0–10.5)

## 2023-05-13 LAB — MAGNESIUM: Magnesium: 1.8 mg/dL (ref 1.7–2.4)

## 2023-05-13 LAB — PROTIME-INR: Prothrombin Time: 19.3 seconds — ABNORMAL HIGH (ref 11.4–15.2)

## 2023-05-13 MED ORDER — WARFARIN SODIUM 5 MG PO TABS
5.0000 mg | ORAL_TABLET | Freq: Once | ORAL | Status: AC
  Administered 2023-05-13: 5 mg via ORAL
  Filled 2023-05-13: qty 1

## 2023-05-13 NOTE — Progress Notes (Addendum)
[address]            [place] [address]          [phone]   7 Days Post-Op Procedure(s) (LRB): BENTALL PROCEDURE USING SJM AORTIC VALVED GRAFT SIZE (N/A) TRANSESOPHAGEAL ECHOCARDIOGRAM (N/A)  Total Length of Stay:  LOS: 7 days   Subjective:  Transferred from ICU yesterday.  Took 2 long walks and is independent with transfers in and out of bed.  Having palpitations with activity. Had an episode last evening with HR to 120's that was associated with diaphoresis and dizziness.   Objective: Vital signs in last 24 hours: Temp:  [98.1 F (36.7 C)-100.4 F (38 C)] 98.8 F (37.1 C) (07/25 0343) Pulse Rate:  [82-105] 94 (07/25 0343) Cardiac Rhythm: Normal sinus rhythm (07/24 1900) Resp:  [16-27] 21 (07/25 0343) BP: (110-130)/(73-88) 120/73 (07/25 0343) SpO2:  [92 %-99 %] 99 % (07/25 0343) Weight:  [106.8 kg] 106.8 kg (07/25 0343)  Filed Weights   05/11/23 0500 05/12/23 0500 05/13/23 0343  Weight: 107.7 kg 108.2 kg 106.8 kg    Weight change: -1.378 kg     Intake/Output from previous day: No intake/output data recorded.  Intake/Output this shift: No intake/output data recorded.  Current Meds: Scheduled Meds:  acetaminophen (TYLENOL) oral liquid 160 mg/5 mL  650 mg Per Tube Once   amiodarone  400 mg Oral BID   aspirin EC  81 mg Oral Daily   bisacodyl  10 mg Oral Daily   Or   bisacodyl  10 mg Rectal Daily   [place] [facility], Patient & Family Education   Does not apply Once   docusate sodium  200 mg Oral Daily   lidocaine  1 patch Transdermal q1800   metoprolol succinate  25 mg Oral Q12H   pantoprazole  40 mg Oral Daily   rosuvastatin  5 mg Oral QHS   sodium chloride flush  3 mL Intravenous Q12H   warfarin  5 mg Oral ONCE-1600   Warfarin - Pharmacist Dosing Inpatient   Does not apply q1600   Continuous Infusions:  sodium chloride     albumin human 999 mL/hr at 05/12/23 0700   PRN Meds:.sodium  chloride, albumin human, gabapentin, metoprolol tartrate, morphine injection, ondansetron (ZOFRAN) IV, mouth rinse, oxyCODONE, sodium chloride flush, traMADol  General appearance: alert, cooperative, and no distress Heart: regular rhythm currently, HR 90s-110s. Had bigeminal PVC's earlier Lungs: breath sounds are clear Abdomen: soft, no tenderness, active bowel sounds Extremities: no edema Wound: sternal incision intact and healing well  Lab Results: CBC: Recent Labs    05/12/23 0355 05/13/23 0049  WBC 8.4 10.9*  HGB 10.3* 10.9*  HCT 30.7* 32.5*  PLT 290 341   BMET:  Recent Labs    05/12/23 0355 05/13/23 0049  NA 129* 131*  K 4.9 4.0  CL 90* 95*  CO2 26 28  GLUCOSE 347* 125*  BUN 17 15  CREATININE 1.11 1.33*  CALCIUM 7.7* 8.3*    CMET: Lab Results  Component Value Date   WBC 10.9 (H) 05/13/2023   HGB 10.9 (L) 05/13/2023   HCT 32.5 (L) 05/13/2023   PLT 341 05/13/2023   GLUCOSE 125 (H) 05/13/2023   CHOL 203 (H) 07/14/2022   TRIG 70 07/14/2022   HDL 57 07/14/2022   LDLCALC 132 (H) 07/14/2022   ALT 26 05/07/2023  AST 52 (H) 05/07/2023   NA 131 (L) 05/13/2023   K 4.0 05/13/2023   CL 95 (L) 05/13/2023   CREATININE 1.33 (H) 05/13/2023   BUN 15 05/13/2023   CO2 28 05/13/2023   TSH 2.196 07/14/2022   INR 1.6 (H) 05/13/2023   HGBA1C 5.4 05/04/2023      PT/INR:  Recent Labs    05/13/23 0049  LABPROT 19.3*  INR 1.6*   Radiology: No results found.   Assessment/Plan: S/P Procedure(s) (LRB): BENTALL PROCEDURE USING SJM AORTIC VALVED GRAFT SIZE (N/A) TRANSESOPHAGEAL ECHOCARDIOGRAM (N/A)  -POD#7 Bentall procedure, mechanical AVR. BP stable. INR 1.6, Coumadin dosing per pharmacy.  -History of PVC's and PAC's- on flecainide prior to admission. Continuing oral amio loading and Toprol XL 25 BID.  Appreciate input from  cardiology.  Planning to transition back to flecainide in the outpatient setting.  -PULM- O2 sats reasonable on RA. Continiue working  on pulmonary hygiene, uses [facility] at night for h/o OSA.  -GI- tolerating PO's, BM yesterday  -RENAL- Stage 3  CKD on admission. Creat trended up to 1.3 No peripheral edema, Wt 1kg+. Monitor.  -ENDO- no h/o DM.    -DVT PPX- Ambulating, on Coumadid, INR 1.6  -Progressing will with mobility. Anticipate he will be ready for discharge to home in 1-2 days but need to assure rhythm issues are resolved.     Leary Roca PA-C Pager [phone] 05/13/2023 7:32 AM  Agree with above Titrating coumadin Continue PT/OT  Corliss Skains

## 2023-05-13 NOTE — Progress Notes (Signed)
CARDIAC REHAB PHASE I   PRE:  Rate/Rhythm: 98 SR  BP:  Sitting: 113/67      SaO2: 98 RA  MODE:  Ambulation: 400 ft   POST:  Rate/Rhythm: 107 ST  BP:  Sitting: 125/78      SaO2: 96 RA   Pt ambulated in hallway,using rollator, moving at slow steady pace. Returned to chair with call bell and bedside table in reach. Post op education including site care, risk factors, heart healthy diet, restrictions, exercise guidelines, home needs at discharge, sternal precautions, IS use at home and CRP2 reviewed. All questions and concerns addressed. Will refer to [facility] for CRP2.    1030-1130 Woodroe Chen, RN BSN 05/13/2023 11:15 AM

## 2023-05-13 NOTE — Progress Notes (Signed)
Progress Note  Patient Name: Timothy Browning Date of Encounter: 05/13/2023  Primary Cardiologist: Thomasene Ripple, DO   Subjective   Patient seen and examined at his bedside.  Telemetry review yesterday appears that he had a run of accelerated idioventricular rhythm when he was transition out of the amiodarone he was slightly symptomatic at that time he tells me but no longer symptomatic with lightheadedness or dizziness.  He is back to his baseline.  Inpatient Medications    Scheduled Meds:  acetaminophen (TYLENOL) oral liquid 160 mg/5 mL  650 mg Per Tube Once   amiodarone  400 mg Oral BID   aspirin EC  81 mg Oral Daily   bisacodyl  10 mg Oral Daily   Or   bisacodyl  10 mg Rectal Daily   [facility], Patient & Family Education   Does not apply Once   docusate sodium  200 mg Oral Daily   lidocaine  1 patch Transdermal q1800   metoprolol succinate  25 mg Oral Q12H   pantoprazole  40 mg Oral Daily   rosuvastatin  5 mg Oral QHS   sodium chloride flush  3 mL Intravenous Q12H   warfarin  5 mg Oral ONCE-1600   Warfarin - Pharmacist Dosing Inpatient   Does not apply q1600   Continuous Infusions:  sodium chloride     albumin human 999 mL/hr at 05/12/23 0700   PRN Meds: sodium chloride, albumin human, gabapentin, metoprolol tartrate, morphine injection, ondansetron (ZOFRAN) IV, mouth rinse, oxyCODONE, sodium chloride flush, traMADol   Vital Signs    Vitals:   05/12/23 2159 05/12/23 2340 05/13/23 0343 05/13/23 0859  BP:  110/74 120/73 113/67  Pulse:  (!) 101 94 100  Resp:  17 (!) 21 20  Temp: 99.5 F (37.5 C) 98.4 F (36.9 C) 98.8 F (37.1 C) 99 F (37.2 C)  TempSrc: Oral Oral Oral Oral  SpO2:  97% 99% 92%  Weight:   106.8 kg   Height:        Intake/Output Summary (Last 24 hours) at 05/13/2023 0944 Last data filed at 05/13/2023 0900 Gross per 24 hour  Intake 0 ml  Output 800 ml  Net -800 ml   Filed Weights   05/11/23 0500 05/12/23 0500 05/13/23 0343   Weight: 107.7 kg 108.2 kg 106.8 kg    Telemetry     - Personally Reviewed  ECG    None today - Personally Reviewed  Physical Exam    General: Comfortable, sitting up in a chair Head: Atraumatic, normal size  Eyes: PEERLA, EOMI  Neck: Supple, normal JVD, RIJ catheter in place Chest: Wound clean and dry Cardiac: Normal S1, S2; RRR; no murmurs, rubs, or gallops Lungs: Clear to auscultation bilaterally Abd: Soft, nontender, no hepatomegaly  Ext: warm, no edema Musculoskeletal: No deformities, BUE and BLE strength normal and equal Skin: Warm and dry, no rashes   Neuro: Alert and oriented to person, place, time, and situation, CNII-XII grossly intact, no focal deficits  Psych: Normal mood and affect   Labs    Chemistry Recent Labs  Lab 05/07/23 0155 05/07/23 0949 05/11/23 0503 05/12/23 0355 05/13/23 0049  NA 133*   < > 132* 129* 131*  K 4.0   < > 3.9 4.9 4.0  CL 103   < > 99 90* 95*  CO2 22   < > 27 26 28   GLUCOSE 117*   < > 105* 347* 125*  BUN 16   < >  16 17 15   CREATININE 1.25*   < > 1.01 1.11 1.33*  CALCIUM 7.5*   < > 8.1* 7.7* 8.3*  PROT 5.4*  --   --   --   --   ALBUMIN 3.3*  --   --   --   --   AST 52*  --   --   --   --   ALT 26  --   --   --   --   ALKPHOS 37*  --   --   --   --   BILITOT 1.2  --   --   --   --   GFRNONAA >60   < > >60 >60 >60  ANIONGAP 8   < > 6 13 8    < > = values in this interval not displayed.     Hematology Recent Labs  Lab 05/11/23 0503 05/12/23 0355 05/13/23 0049  WBC 9.1 8.4 10.9*  RBC 3.78* 3.42* 3.61*  HGB 11.4* 10.3* 10.9*  HCT 34.1* 30.7* 32.5*  MCV 90.2 89.8 90.0  MCH 30.2 30.1 30.2  MCHC 33.4 33.6 33.5  RDW 14.3 14.6 14.6  PLT 270 290 341    Cardiac EnzymesNo results for input(s): "TROPONINI" in the last 168 hours. No results for input(s): "TROPIPOC" in the last 168 hours.   BNPNo results for input(s): "BNP", "PROBNP" in the last 168 hours.   DDimer No results for input(s): "DDIMER" in the last 168 hours.    Radiology    No results found.  Cardiac Studies   Echo reviewed  Patient Profile     52 y.o. male with a hx of aortic thoracic aneurysm on CTA measuring 5.1 cm, HTN, HLD, CKD, HFmrEF.  Assessment & Plan    Postop atrial fibrillation Short run of AIVR -appears to be back in sinus rhythm History of premature atrial complex and premature ventricular complex-giving significant symptoms with started on flecainide AKI on CKD Status post Bentall procedure with mechanical aortic valve   Telemetry did have short run yesterday of AIVR, appears to be back in sinus tachycardia-P waves not discernible will get a twelve-lead this morning  Agree with Amiodarone transition to po. Continue Toprol XL 25 mg BID.     Anticoagulation per pharmacy.   Clinically he appears to be euvolemic, off diuretics will continue to monitor him clinically if need for any further cautious diuretics.   Blood pressure is acceptable, continue with current antihypertensive regimen.      For questions or updates, please contact [facility] Please consult [email or web address] for contact info under Cardiology/STEMI.      Osvaldo Shipper, DO  05/13/2023, 9:44 AM

## 2023-05-13 NOTE — Progress Notes (Signed)
Mobility Specialist Progress Note:   05/13/23 1200  Mobility  Activity Ambulated with assistance in hallway  Level of Assistance Contact guard assist, steadying assist  Assistive Device Four wheel walker  Distance Ambulated (ft) 740 ft  Activity Response Tolerated well  Mobility Referral Yes  $Mobility charge 1 Mobility  Mobility Specialist Start Time (ACUTE ONLY) 1146  Mobility Specialist Stop Time (ACUTE ONLY) 1201  Mobility Specialist Time Calculation (min) (ACUTE ONLY) 15 min    Pre Mobility: 95 HR During Mobility: 105 HR Post Mobility:  96 HR  Pt received in bed, agreeable to mobility. Asymptomatic throughout. Pt left on EOB with call bell and all needs met.  D'Vante Earlene Plater Mobility Specialist Please contact via Special educational needs teacher or Rehab office at [phone]

## 2023-05-13 NOTE — Progress Notes (Signed)
ANTICOAGULATION CONSULT NOTE - Follow-Up  Pharmacy Consult for Warfarin Indication:  mechanical AVR  No Known Allergies  Patient Measurements: Height: (P) 6' (182.9 cm) Weight: 106.8 kg (235 lb 8 oz) IBW/kg (Calculated) : (P) 77.6   Vital Signs: Temp: 98.8 F (37.1 C) (07/25 0343) Temp Source: Oral (07/25 0343) BP: 120/73 (07/25 0343) Pulse Rate: 94 (07/25 0343)  Labs: Recent Labs    05/11/23 0503 05/12/23 0355 05/13/23 0049  HGB 11.4* 10.3* 10.9*  HCT 34.1* 30.7* 32.5*  PLT 270 290 341  LABPROT 21.6* 22.9* 19.3*  INR 1.9* 2.0* 1.6*  CREATININE 1.01 1.11 1.33*    Estimated Creatinine Clearance: 83 mL/min (A) (by C-G formula based on SCr of 1.33 mg/dL (H)).   Medical History: Past Medical History:  Diagnosis Date   Aortic aneurysm without rupture (HCC)    Chest pain of uncertain etiology 08/11/2021   CKD (chronic kidney disease)    GERD (gastroesophageal reflux disease)    Heart failure (HCC)    Hypertension    Obesity (BMI 30-39.9) 08/11/2021   Sleep apnea     Assessment: 52yom s/p mechanical AVR and now with afib  on warfarin per pharmacy.  -INR= 1.6, CBC stable -noted on amiodarone (likely interaction with warfarin)  Goal of Therapy:  INR 2-3 Monitor platelets by anticoagulation protocol: Yes   Plan:  Warfarin 5 mg PO x1 tonight Daily INR  Okey Regal, PharmD **Pharmacist phone directory can now be found on [email or web address] ([email or web address]).  Listed under [facility].

## 2023-05-14 ENCOUNTER — Other Ambulatory Visit (HOSPITAL_COMMUNITY): Payer: Self-pay

## 2023-05-14 DIAGNOSIS — M6281 Muscle weakness (generalized): Secondary | ICD-10-CM | POA: Diagnosis not present

## 2023-05-14 LAB — BASIC METABOLIC PANEL
Calcium: 8.3 mg/dL — ABNORMAL LOW (ref 8.9–10.3)
Glucose, Bld: 111 mg/dL — ABNORMAL HIGH (ref 70–99)

## 2023-05-14 LAB — CBC
Hemoglobin: 10 g/dL — ABNORMAL LOW (ref 13.0–17.0)
RBC: 3.36 MIL/uL — ABNORMAL LOW (ref 4.22–5.81)

## 2023-05-14 LAB — MAGNESIUM: Magnesium: 1.8 mg/dL (ref 1.7–2.4)

## 2023-05-14 MED ORDER — WARFARIN SODIUM 2.5 MG PO TABS
5.0000 mg | ORAL_TABLET | Freq: Every day | ORAL | 11 refills | Status: DC
  Filled 2023-05-14: qty 60, 30d supply, fill #0
  Filled 2023-06-08: qty 60, 30d supply, fill #1
  Filled 2023-07-12: qty 60, 30d supply, fill #2
  Filled 2023-08-16: qty 60, 30d supply, fill #3
  Filled 2023-09-10: qty 60, 30d supply, fill #4
  Filled 2023-10-06: qty 60, 30d supply, fill #5
  Filled 2023-11-01 (×2): qty 60, 30d supply, fill #6

## 2023-05-14 MED ORDER — AMIODARONE HCL 200 MG PO TABS
ORAL_TABLET | ORAL | 1 refills | Status: DC
  Filled 2023-05-14: qty 60, 30d supply, fill #0

## 2023-05-14 MED ORDER — METOPROLOL SUCCINATE ER 25 MG PO TB24
25.0000 mg | ORAL_TABLET | Freq: Two times a day (BID) | ORAL | 2 refills | Status: DC
Start: 2023-05-14 — End: 2023-06-15
  Filled 2023-05-14: qty 60, 30d supply, fill #0

## 2023-05-14 MED ORDER — ASPIRIN 81 MG PO TBEC
81.0000 mg | DELAYED_RELEASE_TABLET | Freq: Every day | ORAL | 12 refills | Status: AC
Start: 2023-05-14 — End: ?
  Filled 2023-05-14: qty 30, 30d supply, fill #0

## 2023-05-14 MED ORDER — TRAMADOL HCL 50 MG PO TABS
50.0000 mg | ORAL_TABLET | Freq: Four times a day (QID) | ORAL | 0 refills | Status: DC | PRN
Start: 2023-05-14 — End: 2023-05-14
  Filled 2023-05-14: qty 40, 5d supply, fill #0

## 2023-05-14 MED ORDER — OXYCODONE HCL 5 MG PO TABS
5.0000 mg | ORAL_TABLET | Freq: Four times a day (QID) | ORAL | 0 refills | Status: DC | PRN
Start: 2023-05-14 — End: 2023-11-05
  Filled 2023-05-14: qty 28, 7d supply, fill #0

## 2023-05-14 NOTE — Progress Notes (Signed)
CARDIAC REHAB PHASE I     Pt ready for discharge home today. All post [facility] education completed. No questions or concerns. Referral sent to [facility] for CRP2.   [id number]  Woodroe Chen, RN BSN 05/14/2023 10:18 AM

## 2023-05-14 NOTE — Discharge Summary (Signed)
Physician Discharge Summary  Patient ID: Timothy Browning MRN: [id number] DOB/AGE: April 02, 1971 52 y.o.  Admit date: 05/06/2023 Discharge date: 05/14/2023  Admission Diagnoses: Principal Problem:   Aortic aneurysm without rupture (HCC) Active Problems:   Ventilator dependence (HCC)   Acute respiratory failure with hypoxia (HCC)   Paroxysmal atrial fibrillation (HCC) Stage 3 CKD   Discharge Diagnoses:  Principal Problem:   Aortic aneurysm without rupture (HCC) Active Problems:   Ventilator dependence (HCC)   Acute respiratory failure with hypoxia (HCC)   Paroxysmal atrial fibrillation (HCC) Stage 3 CKD Expected acute blood loss anemia  Discharged Condition: stable  Referring: Thomasene Ripple, DO Primary Care: Marrianne Mood, MD Primary Cardiologist:Kardie Tobb, DO  History of Present Illness:     52 year old male with past medical history notable for chronic kidney disease, obesity, GERD, and heart failure presents in follow-up to discuss the results of his surveillance CT scan his aortic root aneurysm.  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  He also has a history of some palpitations, which is currently being followed by cardiology.  We discussed the risks and benefits of with valve sparing root replacement given the size, and his concern for dissection.  He is agreeable to proceed.  His repeat echo is clean, and he has had cardiac clearance.      Hospital Course: Mr. Ebron was admitted for elective surgery on 05/06/23 and taken to the OR where upon direct inspection he was found to have multiple fenestration of all three aortic valve leaflets in addition to the ascending aortic aneurysm.  Dr. Cliffton Asters proceeded with aortic valve replacement and aneurysm repair with a Bentall procedure utilizing a 29mm aortic valved conduit.  Following the procedure he separated from cardiopulmonary bypass without difficulty without any inotropic or pressor support. He was  transferred to the ICU in stable condition. He was weaned from the mechanical ventilator support and extubated by 5:30 pm on the day of surgery. The critical care medicine team assisted with ICU care in the early post-op phase of recovery.  Hypoxia immediately after extubation was managed with heated high-flow O2 but he was quickly weaned back down to 3Lnc O2. He remained hemodynamically stable. He was started on Coumadin anticoagulation for the mechanical heart valve.  Coumadin dosing was managed by the inpatient pharmacy team. Daily INR's were monitored. He developed atrial fibrillation with RVR on post-op day 3.  IV amiodarone loading was initiated. He converted to an accelerated junctional rhythm.  We consulted Dr. Thomasene Ripple as she had been managing his palpitations (which consisted of frequent PAC's and PVC's) with flecainide prior to admission.  He was transferred to [facility]E [facility] on 05/12/23.  He continued in an accelerated junctional rhythm and continued to have a sense of palpitations associated with dizziness brought on by minimal activity. We consulted cardiology for assistance with rhythm management. Metoprolol was increased and the amiodarone loading was continued. He had return of SR. He regained independence with mobility and was tolerating a diet with no trouble.  He was discharged in stable condition.  Consults: cardiology and pulmonary/intensive care  Significant Diagnostic Studies:   CLINICAL DATA:  Postoperative state, evaluate atelectasis seen in the previous study   EXAM: CHEST  1 VIEW   COMPARISON:  Previous studies including the examination of 05/08/2023   FINDINGS: Transverse diameter of heart is increased. There is interval removal of left chest tube and mediastinal drain. Prosthetic aortic valve is seen. Tip of right IJ venous catheter is  seen in the course of right innominate vein. There is improvement in the aeration in the lower lung fields. Residual linear  densities are seen in the lower lung fields. There is blunting of both lateral CP angles. There is no pneumothorax.   IMPRESSION: There is interval improvement in aeration in the lower lung fields suggesting decrease in atelectasis and decrease in small bilateral pleural effusions. There are no signs of alveolar pulmonary edema or new focal infiltrates.     Electronically Signed   By: Ernie Avena M.D.   On: 05/11/2023 10:15    Treatments: 05/06/2023 Patient:  Timothy Browning Pre-Op Dx: Aortic root aneurysm Post-op Dx: Same Procedure: Bental procedure with 29 mm Saint Jude aortic valve conduit. Reimplantation of right and left coronary arteries.     Surgeon and Role:      * Lightfoot, Eliezer Lofts, MD - Primary    *P. Weldner, MD-co-surgeon     Anesthesia  general EBL: 500 ml Blood Administration: None Xclamp Time: 123 min Pump Time: 157 min   Drains: 19 F blake drain:  R, L, mediastinal  Wires: Ventricular Counts: correct     Indications: 52 year old male with 5.4 cm, aortic root aneurysm.  He also has a history of some palpitations, which is currently being followed by cardiology.  We discussed the risks and benefits of with valve sparing root replacement given the size, and his concern for dissection.  He is agreeable to proceed.  His repeat echo is clean, and he has had cardiac clearance.      Findings: Aortic root aneurysm.  There were multiple fenestrations in all 3 aortic valve leaflets.  We elected to proceed with a Bentall procedure.  Discharge Exam: Blood pressure 107/68, pulse 84, temperature 98.3 F (36.8 C), temperature source Oral, resp. rate 14, height (P) 6' (1.829 m), weight 106.4 kg, SpO2 94%.  General appearance: alert, cooperative, and no distress Heart: regular rhythm currently, HR 90s. Lungs: breath sounds are clear Abdomen: soft, no tenderness, active bowel sounds Extremities: no edema Wound: sternal incision intact and healing  well  Disposition:  Discharged to home in stable condition Discharge Instructions     Amb Referral to Cardiac Rehabilitation   Complete by: As directed    Diagnosis: Other   After initial evaluation and assessments completed: Virtual Based Care may be provided alone or in conjunction with Phase 2 Cardiac Rehab based on patient barriers.: Yes   Intensive Cardiac Rehabilitation (ICR) [facility] location only OR Traditional Cardiac Rehabilitation (TCR) *If criteria for ICR are not met will enroll in TCR [facility] only): Yes      Allergies as of 05/14/2023   No Known Allergies      Medication List     STOP taking these medications    flecainide 50 MG tablet Commonly known as: TAMBOCOR   losartan 25 MG tablet Commonly known as: Cozaar       TAKE these medications    Acetaminophen Extra Strength 500 MG Tabs Commonly known as: TYLENOL Take 2 tablets (1,000 mg total) by mouth every 8 (eight) hours as needed for mild pain.   amiodarone 200 MG tablet Commonly known as: PACERONE Take 2 tablets (400 mg total) by mouth 2 (two) times daily. For 5 days then reduce the dose to 1 tablet (200mg ) by mouth twice daily for 2 weeks then decrease the dose to 1 tablet by mouth daily.   aspirin EC 81 MG tablet Take 1 tablet (81 mg total) by mouth daily.  Swallow whole.   cyclobenzaprine 5 MG tablet Commonly known as: FLEXERIL Take 5 mg by mouth 3 (three) times daily as needed for muscle spasms.   diclofenac Sodium 1 % Gel Commonly known as: Voltaren Apply 2 g topically 4 (four) times daily.   hydrOXYzine 50 MG tablet Commonly known as: ATARAX Take 1 tablet (50 mg total) by mouth 3 (three) times daily as needed for anxiety.   metoprolol succinate 25 MG 24 hr tablet Commonly known as: TOPROL-XL Take 1 tablet (25 mg total) by mouth every 12 (twelve) hours. What changed: when to take this   rosuvastatin 5 MG tablet Commonly known as: CRESTOR Take 1 tablet (5 mg total) by mouth daily.    sildenafil 25 MG tablet Commonly known as: VIAGRA Take 100 mg by mouth daily as needed for erectile dysfunction.   traMADol 50 MG tablet Commonly known as: ULTRAM Take 1-2 tablets (50-100 mg total) by mouth every 6 (six) hours as needed for up to 7 days for moderate pain.   Vitamin D (Ergocalciferol) 1.25 MG (50000 UNIT) Caps capsule Commonly known as: DRISDOL Take 1 capsule (50,000 Units total) by mouth every 7 (seven) days.   warfarin 2.5 MG tablet Commonly known as: Coumadin Take 2 tablets (5 mg total) by mouth daily. Or as directed by the Coumadin Clinic               Durable Medical Equipment  (From admission, onward)           Start     Ordered   05/12/23 1327  For home use only DME 4 wheeled rolling walker with seat  Once       Question Answer Comment  Patient needs a walker to treat with the following condition S/P AVR (aortic valve replacement)   Patient needs a walker to treat with the following condition Physical deconditioning      05/12/23 1327            Follow-up Information     [facility] Follow up on 05/17/2023.   Specialty: Cardiology Why: 2:30PM. Coumadin clinic visit. Contact information: [address] [place] [address] [phone]        Thomasene Ripple, DO Follow up on 06/08/2023.   Specialty: Cardiology Why: 8:40AM. Cardiology follow up Contact information: [address] [place] [address] [phone]         Corliss Skains, MD Follow up on 05/28/2023.   Specialty: Cardiothoracic Surgery Why: Do not go the the office. Dr. Cliffton Asters will call you at around 3:30pm. Contact information: [address] [place] [address] [phone]                 The patient has been discharged on:   1.Beta Blocker:  Yes [ x  ]                              No   [   ]                              If No, reason:  2.Ace  Inhibitor/ARB: Yes [   ]  No  [  x  ]                                     If No, reason: soft BP  3.Statin:   Yes [ x  ]                  No  [   ]                  If No, reason:  4.Ecasa:  Yes  [   x]                  No   [   ]                  If No, reason:  5. ACS on Admission? No  P2Y12 Inhibitor:  Yes  [   ]                                No  [  x]    Signed: Leary Roca, PA-C 05/14/2023, 8:12 AM

## 2023-05-14 NOTE — Plan of Care (Signed)
  Problem: Health Behavior/Discharge Planning: Goal: Ability to manage health-related needs will improve Outcome: Progressing   Problem: Clinical Measurements: Goal: Will remain free from infection Outcome: Progressing Goal: Diagnostic test results will improve Outcome: Progressing   

## 2023-05-14 NOTE — Progress Notes (Signed)
Pt given morning medications, discussing discharge some, pt stated that tramadol makes him sweat, he has taken once here and in the past at home. Pt asking if he could have other pain medication for home. Call placed to PA, flow nurse Shanda Bumps assisting with call and will complete discharge instructions once PA able to change pain medication.  IV and telemetry removed, pt getting dressed.   Annice Needy, RN SWOT

## 2023-05-14 NOTE — TOC Transition Note (Signed)
Transition of Care [facility]) - CM/SW Discharge Note   Patient Details  Name: TERRAL GLAVES MRN: [id number] Date of Birth: Oct 14, 1971  [facility] CM/SW Contact:  Lockie Pares, RN Phone Number: 05/14/2023, 10:01 AM   Clinical Narrative:    Patient DC home today with Walker  No further needs   Final next level of care: Home/Self Care Barriers to Discharge: No Barriers Identified   Patient Goals and CMS Choice      Discharge Placement                         Discharge Plan and Services Additional resources added to the After Visit Summary for     Discharge Planning Services: CM Consult            DME Arranged: Walker rolling with seat DME Agency: [facility] Date DME Agency Contacted: 05/12/23   Representative spoke with at DME Agency: Zollie Beckers            Social Determinants of Health (SDOH) Interventions SDOH Screenings   Food Insecurity: No Food Insecurity (05/06/2023)  Housing: Patient Declined (05/06/2023)  Transportation Needs: No Transportation Needs (05/06/2023)  Utilities: Not At Risk (05/06/2023)  Depression (PHQ2-9): Low Risk  (04/20/2023)  Social Connections: Unknown (03/19/2023)   Received from [facility]  Tobacco Use: Low Risk  (05/06/2023)     Readmission Risk Interventions     No data to display

## 2023-05-14 NOTE — Progress Notes (Signed)
[address]            [place] [address]          [phone]   8 Days Post-Op Procedure(s) (LRB): BENTALL PROCEDURE USING SJM AORTIC VALVED GRAFT SIZE (N/A) TRANSESOPHAGEAL ECHOCARDIOGRAM (N/A)  Total Length of Stay:  LOS: 8 days   Subjective:  Better day yesterday. No episodes of palpitations or dizziness.  Walked in the hall a couple of times.  Would like to go home.   Objective: Vital signs in last 24 hours: Temp:  [98.1 F (36.7 C)-99.1 F (37.3 C)] 98.3 F (36.8 C) (07/26 0628) Pulse Rate:  [84-100] 84 (07/26 0628) Cardiac Rhythm: Sinus tachycardia (07/25 2024) Resp:  [14-20] 14 (07/26 0628) BP: (99-113)/(65-73) 107/68 (07/26 0628) SpO2:  [92 %-98 %] 94 % (07/26 0628) Weight:  [106.4 kg] 106.4 kg (07/26 0628)  Filed Weights   05/12/23 0500 05/13/23 0343 05/14/23 0628  Weight: 108.2 kg 106.8 kg 106.4 kg    Weight change: -0.408 kg     Intake/Output from previous day: 07/25 0701 - 07/26 0700 In: 129.4 [I.V.:129.4] Out: 800 [Urine:800]  Intake/Output this shift: No intake/output data recorded.  Current Meds: Scheduled Meds:  acetaminophen (TYLENOL) oral liquid 160 mg/5 mL  650 mg Per Tube Once   amiodarone  400 mg Oral BID   aspirin EC  81 mg Oral Daily   bisacodyl  10 mg Oral Daily   Or   bisacodyl  10 mg Rectal Daily   [facility], Patient & Family Education   Does not apply Once   docusate sodium  200 mg Oral Daily   lidocaine  1 patch Transdermal q1800   metoprolol succinate  25 mg Oral Q12H   pantoprazole  40 mg Oral Daily   rosuvastatin  5 mg Oral QHS   sodium chloride flush  3 mL Intravenous Q12H   Warfarin - Pharmacist Dosing Inpatient   Does not apply q1600   Continuous Infusions:  sodium chloride     albumin human 999 mL/hr at 05/12/23 0700   PRN Meds:.sodium chloride, albumin human, gabapentin, metoprolol tartrate, morphine injection, ondansetron (ZOFRAN) IV, mouth rinse,  oxyCODONE, sodium chloride flush, traMADol  General appearance: alert, cooperative, and no distress Heart: regular rhythm currently, HR 90s. Lungs: breath sounds are clear Abdomen: soft, no tenderness, active bowel sounds Extremities: no edema Wound: sternal incision intact and healing well  Lab Results: CBC: Recent Labs    05/13/23 0049 05/14/23 0059  WBC 10.9* 9.7  HGB 10.9* 10.0*  HCT 32.5* 30.6*  PLT 341 383   BMET:  Recent Labs    05/13/23 0049 05/14/23 0059  NA 131* 135  K 4.0 3.9  CL 95* 100  CO2 28 27  GLUCOSE 125* 111*  BUN 15 18  CREATININE 1.33* 1.36*  CALCIUM 8.3* 8.3*    CMET: Lab Results  Component Value Date   WBC 9.7 05/14/2023   HGB 10.0 (L) 05/14/2023   HCT 30.6 (L) 05/14/2023   PLT 383 05/14/2023   GLUCOSE 111 (H) 05/14/2023   CHOL 203 (H) 07/14/2022   TRIG 70 07/14/2022   HDL 57 07/14/2022   LDLCALC 132 (H) 07/14/2022   ALT 26 05/07/2023   AST 52 (H) 05/07/2023   NA 135 05/14/2023   K 3.9 05/14/2023   CL 100 05/14/2023   CREATININE 1.36 (  H) 05/14/2023   BUN 18 05/14/2023   CO2 27 05/14/2023   TSH 2.196 07/14/2022   INR 1.7 (H) 05/14/2023   HGBA1C 5.4 05/04/2023      PT/INR:  Recent Labs    05/14/23 0059  LABPROT 20.5*  INR 1.7*   Radiology: No results found.   Assessment/Plan: S/P Procedure(s) (LRB): BENTALL PROCEDURE USING SJM AORTIC VALVED GRAFT SIZE (N/A) TRANSESOPHAGEAL ECHOCARDIOGRAM (N/A)  -POD#8 Bentall procedure, mechanical AVR. BP stable. INR 1.6, Coumadin dosing per pharmacy.  -History of PVC's and PAC's- on flecainide prior to admission. Continuing oral amio loading and Toprol XL 25 BID.  Appreciate input from  cardiology.  Planning to transition back to flecainide in the outpatient setting.  -PULM- O2 sats reasonable on RA,  uses [facility] at night for h/o OSA.  -GI- tolerating PO's, appropriate bowel function  -RENAL- Stage 3  CKD on admission. Creat stable.  No peripheral edema, Wt 1kg+.   -ENDO-  no h/o DM.    -DVT PPX- Ambulating, on Coumadid, INR 1.7  -Plan for discharge today on Coumadin 5mg  daily. Check INR on Monday at the [facility].    Leary Roca PA-C Pager [phone] 05/14/2023 7:58 AM

## 2023-05-14 NOTE — Progress Notes (Signed)
Discharge instructions provided to patient. All medications, follow up appointments, and discharge instructions provided. IV out. Monitor off CCMD notified. Discharging home.

## 2023-05-16 ENCOUNTER — Other Ambulatory Visit: Payer: Self-pay

## 2023-05-16 DIAGNOSIS — Z952 Presence of prosthetic heart valve: Secondary | ICD-10-CM

## 2023-05-16 DIAGNOSIS — I480 Paroxysmal atrial fibrillation: Secondary | ICD-10-CM

## 2023-05-17 ENCOUNTER — Ambulatory Visit (INDEPENDENT_AMBULATORY_CARE_PROVIDER_SITE_OTHER): Payer: Medicaid Other

## 2023-05-17 ENCOUNTER — Other Ambulatory Visit (HOSPITAL_COMMUNITY): Payer: Self-pay

## 2023-05-17 ENCOUNTER — Encounter: Payer: Self-pay | Admitting: Cardiology

## 2023-05-17 ENCOUNTER — Telehealth: Payer: Self-pay

## 2023-05-17 ENCOUNTER — Encounter: Payer: Self-pay | Admitting: Student

## 2023-05-17 DIAGNOSIS — Z952 Presence of prosthetic heart valve: Secondary | ICD-10-CM | POA: Insufficient documentation

## 2023-05-17 DIAGNOSIS — Z5181 Encounter for therapeutic drug level monitoring: Secondary | ICD-10-CM

## 2023-05-17 DIAGNOSIS — I4891 Unspecified atrial fibrillation: Secondary | ICD-10-CM | POA: Insufficient documentation

## 2023-05-17 MED ORDER — FUROSEMIDE 20 MG PO TABS
20.0000 mg | ORAL_TABLET | Freq: Every day | ORAL | 0 refills | Status: DC
  Filled 2023-05-17: qty 30, 30d supply, fill #0

## 2023-05-17 NOTE — Telephone Encounter (Signed)
Patient states swelling while in hospital and has increased.  States no weight increase. Swelling feet up to lower legs, worse in right. No increased SOB.  Non pitting but states top of socks will leave indention.   Advised watch sodium intake, elevate as possible during the day and also at night.  Per provider start Lasix 20 mg for 3 days.    Patient weight this AM 231 Yesterday 231  Patient will take medication for 3 days as directed then call provider with weight and advise how swelling is.

## 2023-05-17 NOTE — Patient Instructions (Signed)
Continue 2 tablets Daily. INR in 1 week. Stay consistent with greens each week  Coumadin Clinic [phone]; Amiodarone taper.  A full discussion of the nature of anticoagulants has been carried out.  A benefit risk analysis has been presented to the patient, so that they understand the justification for choosing anticoagulation at this time. The need for frequent and regular monitoring, precise dosage adjustment and compliance is stressed.  Side effects of potential bleeding are discussed.  The patient should avoid any OTC items containing aspirin or ibuprofen, and should avoid great swings in general diet.  Avoid alcohol consumption.  Call if any signs of abnormal bleeding.  [phone]

## 2023-05-17 NOTE — Transitions of Care (Post Inpatient/ED Visit) (Signed)
05/17/2023  Name: Timothy Browning MRN: [id number] DOB: Nov 24, 1970  Today's TOC FU Call Status: Today's TOC FU Call Status:: Successful TOC FU Call Competed TOC FU Call Complete Date: 05/17/23  Transition Care Management Follow-up Telephone Call Date of Discharge: 05/14/23 Discharge Facility: [facility]) Type of Discharge: Inpatient Admission Primary Inpatient Discharge Diagnosis:: aortic aneurysm How have you been since you were released from the hospital?: Better Any questions or concerns?: No  Items Reviewed: Did you receive and understand the discharge instructions provided?: Yes Medications obtained,verified, and reconciled?: Yes (Medications Reviewed) Any new allergies since your discharge?: No Dietary orders reviewed?: Yes Do you have support at home?: Yes People in Home: significant other  Medications Reviewed Today: Medications Reviewed Today     Reviewed by Karena Addison, LPN (Licensed Practical Nurse) on 05/17/23 at 1043  Med List Status: <None>   Medication Order Taking? Sig Documenting Provider Last Dose Status Informant  acetaminophen (TYLENOL) 500 MG tablet [id number] No Take 2 tablets (1,000 mg total) by mouth every 8 (eight) hours as needed for mild pain. Morene Crocker, MD More than a month Active Self  amiodarone (PACERONE) 200 MG tablet [id number]  Take 2 tablets (400 mg total) by mouth 2 (two) times daily for 5 days, THEN 1 tablet (200 mg total) 2 (two) times daily for 14 days, THEN 1 tablet (200 mg total) daily thereafter. Parke Poisson  Active   aspirin EC 81 MG tablet [id number]  Take 1 tablet (81 mg total) by mouth daily. Swallow whole. Leary Roca, PA-C  Active   cyclobenzaprine (FLEXERIL) 5 MG tablet [id number] No Take 5 mg by mouth 3 (three) times daily as needed for muscle spasms. [provider] More than a month Active Self  diclofenac Sodium (VOLTAREN) 1 % GEL [id number] No Apply 2 g topically 4 (four) times  daily. Evlyn Kanner, MD More than a month Active Self  hydrOXYzine (ATARAX) 50 MG tablet [id number] No Take 1 tablet (50 mg total) by mouth 3 (three) times daily as needed for anxiety. Marrianne Mood, MD Taking Active Self           Med Note Richardean Canal Apr 29, 2023  3:49 PM) Has not started yet  metoprolol succinate (TOPROL-XL) 25 MG 24 hr tablet [id number]  Take 1 tablet (25 mg total) by mouth every 12 (twelve) hours. Leary Roca, PA-C  Active   oxyCODONE (OXY IR/ROXICODONE) 5 MG immediate release tablet [id number]  Take 1 tablet (5 mg total) by mouth every 6 (six) hours as needed for up to 7 days for severe pain. Rowe Clack, PA-C  Active   rosuvastatin (CRESTOR) 5 MG tablet [id number] No Take 1 tablet (5 mg total) by mouth daily. Masters, Psychiatric nurse, DO 05/06/2023 0500 Active Self  sildenafil (VIAGRA) 25 MG tablet [id number] No Take 100 mg by mouth daily as needed for erectile dysfunction. [provider] Past Week Active Self  Vitamin D, Ergocalciferol, (DRISDOL) 1.25 MG (50000 UNIT) CAPS capsule [id number] No Take 1 capsule (50,000 Units total) by mouth every 7 (seven) days. Tobb, Kardie, DO More than a month Active Self  warfarin (COUMADIN) 2.5 MG tablet [id number]  Take 2 tablets (5 mg total) by mouth daily. Or as directed by the Coumadin Clinic Parke Poisson  Active             Home Care and Equipment/Supplies: Were Home Health Services Ordered?: NA Any new equipment or medical supplies ordered?:  NA  Functional Questionnaire: Do you need assistance with bathing/showering or dressing?: No Do you need assistance with meal preparation?: No Do you need assistance with eating?: No Do you have difficulty maintaining continence: No Do you need assistance with getting out of bed/getting out of a chair/moving?: No Do you have difficulty managing or taking your medications?: No  Follow up appointments reviewed: Specialist Hospital Follow-up  appointment confirmed?: Yes Date of Specialist follow-up appointment?: 06/08/23 Follow-Up Specialty Provider:: cardio Do you need transportation to your follow-up appointment?: No Do you understand care options if your condition(s) worsen?: Yes-patient verbalized understanding No avail appt times, sent message to staff to schedule   SIGNATURE Karena Addison, LPN [facility] Nurse Health Advisor Direct Dial [phone]

## 2023-05-17 NOTE — Telephone Encounter (Signed)
Per Dr Barnetta Hammersmith nurse:  Leslye Peer. Dr. Servando Salina would like this pt to start Lasix 20 mg for 3 days. He can see an APP or Dr. Servando Salina next week.

## 2023-05-19 MED FILL — Mannitol IV Soln 20%: INTRAVENOUS | Qty: 500 | Status: AC

## 2023-05-19 MED FILL — Sodium Bicarbonate IV Soln 8.4%: INTRAVENOUS | Qty: 50 | Status: AC

## 2023-05-19 MED FILL — Electrolyte-R (PH 7.4) Solution: INTRAVENOUS | Qty: 3000 | Status: AC

## 2023-05-19 MED FILL — Heparin Sodium (Porcine) Inj 1000 Unit/ML: INTRAMUSCULAR | Qty: 10 | Status: AC

## 2023-05-19 MED FILL — Sodium Chloride IV Soln 0.9%: INTRAVENOUS | Qty: 2000 | Status: AC

## 2023-05-20 DIAGNOSIS — Z419 Encounter for procedure for purposes other than remedying health state, unspecified: Secondary | ICD-10-CM | POA: Diagnosis not present

## 2023-05-20 DIAGNOSIS — G4733 Obstructive sleep apnea (adult) (pediatric): Secondary | ICD-10-CM | POA: Diagnosis not present

## 2023-05-25 ENCOUNTER — Ambulatory Visit: Payer: Medicaid Other | Attending: Internal Medicine

## 2023-05-25 DIAGNOSIS — I480 Paroxysmal atrial fibrillation: Secondary | ICD-10-CM

## 2023-05-25 DIAGNOSIS — Z5181 Encounter for therapeutic drug level monitoring: Secondary | ICD-10-CM | POA: Diagnosis not present

## 2023-05-25 DIAGNOSIS — Z952 Presence of prosthetic heart valve: Secondary | ICD-10-CM

## 2023-05-25 LAB — POCT INR: INR: 3.2 — AB (ref 2.0–3.0)

## 2023-05-25 NOTE — Patient Instructions (Signed)
Continue 2 tablets Daily. INR in 1 week. EAT Greens tonight Stay consistent with greens each week, INCREASE by 1 helping [facility] [phone]; Amiodarone taper.  [phone]

## 2023-05-27 NOTE — Progress Notes (Signed)
     [address]       [place] [address]             [phone]       Patient: Home Provider: Office Consent for Telemedicine visit obtained.  Today's visit was completed via a real-time telehealth (see specific modality noted below). The patient/authorized person provided oral consent at the time of the visit to engage in a telemedicine encounter with the present provider at [facility]. The patient/authorized person was informed of the potential benefits, limitations, and risks of telemedicine. The patient/authorized person expressed understanding that the laws that protect confidentiality also apply to telemedicine. The patient/authorized person acknowledged understanding that telemedicine does not provide emergency services and that he or she would need to call 911 or proceed to the nearest hospital for help if such a need arose.   Total time spent in the clinical discussion 10 minutes.  Telehealth Modality: Phone visit (audio only)  I had a telephone visit with  Timothy Browning who is s/p Scientist, forensic.  Overall doing well.  Pain is minimal.  Ambulating well. Vitals have been stable.  Timothy Browning will see [place] back in 1 month with a chest x-ray for cardiac rehab clearance.   Keane Scrape

## 2023-05-28 ENCOUNTER — Encounter: Payer: Self-pay | Admitting: Cardiology

## 2023-05-28 ENCOUNTER — Other Ambulatory Visit (HOSPITAL_COMMUNITY): Payer: Self-pay

## 2023-05-28 ENCOUNTER — Other Ambulatory Visit: Payer: Self-pay

## 2023-05-28 ENCOUNTER — Ambulatory Visit: Payer: Medicaid Other | Admitting: Cardiology

## 2023-05-28 ENCOUNTER — Ambulatory Visit (INDEPENDENT_AMBULATORY_CARE_PROVIDER_SITE_OTHER): Payer: Self-pay | Admitting: Thoracic Surgery (Cardiothoracic Vascular Surgery)

## 2023-05-28 VITALS — BP 134/84 | HR 73 | Ht 72.0 in | Wt 229.6 lb

## 2023-05-28 DIAGNOSIS — Z9889 Other specified postprocedural states: Secondary | ICD-10-CM | POA: Diagnosis not present

## 2023-05-28 DIAGNOSIS — N183 Chronic kidney disease, stage 3 unspecified: Secondary | ICD-10-CM | POA: Diagnosis not present

## 2023-05-28 DIAGNOSIS — I4891 Unspecified atrial fibrillation: Secondary | ICD-10-CM | POA: Diagnosis not present

## 2023-05-28 DIAGNOSIS — Z79899 Other long term (current) drug therapy: Secondary | ICD-10-CM

## 2023-05-28 DIAGNOSIS — Z952 Presence of prosthetic heart valve: Secondary | ICD-10-CM

## 2023-05-28 DIAGNOSIS — G4733 Obstructive sleep apnea (adult) (pediatric): Secondary | ICD-10-CM | POA: Diagnosis not present

## 2023-05-28 DIAGNOSIS — Z8679 Personal history of other diseases of the circulatory system: Secondary | ICD-10-CM | POA: Diagnosis not present

## 2023-05-28 DIAGNOSIS — I480 Paroxysmal atrial fibrillation: Secondary | ICD-10-CM | POA: Diagnosis not present

## 2023-05-28 LAB — COMPREHENSIVE METABOLIC PANEL
ALT: 35 IU/L (ref 0–44)
AST: 27 IU/L (ref 0–40)
Albumin: 3.8 g/dL (ref 3.8–4.9)
Alkaline Phosphatase: 101 IU/L (ref 44–121)
BUN/Creatinine Ratio: 10 (ref 9–20)
BUN: 15 mg/dL (ref 6–24)
Bilirubin Total: 0.4 mg/dL (ref 0.0–1.2)
CO2: 25 mmol/L (ref 20–29)
Calcium: 9.5 mg/dL (ref 8.7–10.2)
Chloride: 101 mmol/L (ref 96–106)
Creatinine, Ser: 1.57 mg/dL — ABNORMAL HIGH (ref 0.76–1.27)
Globulin, Total: 3 g/dL (ref 1.5–4.5)
Glucose: 86 mg/dL (ref 70–99)
Potassium: 4.7 mmol/L (ref 3.5–5.2)
Sodium: 136 mmol/L (ref 134–144)
Total Protein: 6.8 g/dL (ref 6.0–8.5)
eGFR: 53 mL/min/{1.73_m2} — ABNORMAL LOW (ref >59)

## 2023-05-28 LAB — PROTIME-INR

## 2023-05-28 LAB — MAGNESIUM: Magnesium: 2 mg/dL (ref 1.6–2.3)

## 2023-05-28 MED ORDER — AMIODARONE HCL 200 MG PO TABS
ORAL_TABLET | ORAL | 3 refills | Status: DC
  Filled 2023-05-28: qty 90, fill #0

## 2023-05-28 MED ORDER — AMIODARONE HCL 200 MG PO TABS
ORAL_TABLET | ORAL | 0 refills | Status: DC
  Filled 2023-05-28: qty 90, 38d supply, fill #0
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
## 2023-05-28 NOTE — Patient Instructions (Addendum)
Medication Instructions:  Your physician has recommended you make the following change in your medication:  CHANGE: Amiodarone 400 mg (two tablets) twice daily for 1 week, THEN 200 twice daily until seen in office.  *If you need a refill on your cardiac medications before your next appointment, please call your pharmacy*   Lab Work: Your physician recommends that you return for lab work in: CMET, Mag, PT/INR If you have labs (blood work) drawn today and your tests are completely normal, you will receive your results only by: MyChart Message (if you have MyChart) OR A paper copy in the mail If you have any lab test that is abnormal or we need to change your treatment, we will call you to review the results.   Testing/Procedures: None   Follow-Up: At [facility], you and your health needs are our priority.  As part of our continuing mission to provide you with exceptional heart care, we have created designated Provider Care Teams.  These Care Teams include your primary Cardiologist (physician) and Advanced Practice Providers (APPs -  Physician Assistants and Nurse Practitioners) who all work together to provide you with the care you need, when you need it.    Your next appointment:   8 week(s)  Provider:   Thomasene Ripple, DO

## 2023-05-29 ENCOUNTER — Other Ambulatory Visit: Payer: Self-pay

## 2023-05-29 ENCOUNTER — Encounter (HOSPITAL_BASED_OUTPATIENT_CLINIC_OR_DEPARTMENT_OTHER): Payer: Self-pay

## 2023-05-29 ENCOUNTER — Emergency Department (HOSPITAL_BASED_OUTPATIENT_CLINIC_OR_DEPARTMENT_OTHER)
Admission: EM | Admit: 2023-05-29 | Discharge: 2023-05-29 | Disposition: A | Discharge status: Home / Self Care | Payer: Medicaid Other | Source: Home / Self Care | Attending: Emergency Medicine | Admitting: Emergency Medicine

## 2023-05-29 DIAGNOSIS — Z7982 Long term (current) use of aspirin: Secondary | ICD-10-CM | POA: Insufficient documentation

## 2023-05-29 DIAGNOSIS — Z7901 Long term (current) use of anticoagulants: Secondary | ICD-10-CM | POA: Diagnosis not present

## 2023-05-29 DIAGNOSIS — R319 Hematuria, unspecified: Secondary | ICD-10-CM | POA: Diagnosis present

## 2023-05-29 DIAGNOSIS — R310 Gross hematuria: Secondary | ICD-10-CM

## 2023-05-29 LAB — BASIC METABOLIC PANEL
Anion gap: 8 (ref 5–15)
BUN: 20 mg/dL (ref 6–20)
CO2: 24 mmol/L (ref 22–32)
Calcium: 8.8 mg/dL — ABNORMAL LOW (ref 8.9–10.3)
Chloride: 100 mmol/L (ref 98–111)
Creatinine, Ser: 1.4 mg/dL — ABNORMAL HIGH (ref 0.61–1.24)
GFR, Estimated: >60 mL/min (ref >60)
Glucose, Bld: 151 mg/dL — ABNORMAL HIGH (ref 70–99)
Potassium: 3.7 mmol/L (ref 3.5–5.1)
Sodium: 132 mmol/L — ABNORMAL LOW (ref 135–145)

## 2023-05-29 LAB — CBC
HCT: 40.5 % (ref 39.0–52.0)
Hemoglobin: 13.2 g/dL (ref 13.0–17.0)
MCH: 30 pg (ref 26.0–34.0)
MCHC: 32.6 g/dL (ref 30.0–36.0)
MCV: 92 fL (ref 80.0–100.0)
Platelets: 540 10*3/uL — ABNORMAL HIGH (ref 150–400)
RBC: 4.4 MIL/uL (ref 4.22–5.81)
RDW: 14.6 % (ref 11.5–15.5)
WBC: 6.1 10*3/uL (ref 4.0–10.5)
nRBC: 0 % (ref 0.0–0.2)

## 2023-05-29 LAB — URINALYSIS, ROUTINE W REFLEX MICROSCOPIC
Bilirubin Urine: NEGATIVE
Glucose, UA: NEGATIVE mg/dL
Ketones, ur: NEGATIVE mg/dL
Leukocytes,Ua: NEGATIVE
Nitrite: NEGATIVE
Protein, ur: 100 mg/dL — AB
Specific Gravity, Urine: 1.025 (ref 1.005–1.030)
pH: 5.5 (ref 5.0–8.0)

## 2023-05-29 LAB — URINALYSIS, MICROSCOPIC (REFLEX): RBC / HPF: >50 RBC/hpf (ref 0–5)

## 2023-05-29 NOTE — ED Triage Notes (Signed)
The patient having hematuria today. No pain. He is taking a blood thinner.

## 2023-05-29 NOTE — Discharge Instructions (Addendum)
As we discussed, you are likely having hematuria because you are on Coumadin.  I recommend that you stay hydrated.  We checked your blood count and your kidney function and they were normal today.  I recommend that you follow-up with urology next week if you have persistent hematuria.  As we discussed, if you have worsening hematuria or clots in your urine or cannot urinate you need to immediately go to [facility] long hospital.

## 2023-05-29 NOTE — ED Notes (Signed)
Bladder scan 0mL.

## 2023-05-29 NOTE — ED Provider Notes (Signed)
[place] [facility] Provider Note   CSN: [id number] Arrival date & time: 05/29/23  1647     History  Chief Complaint  Patient presents with   Hematuria    Timothy Browning is a 52 y.o. male here presenting with hematuria.  Patient has aortic aneurysm that was operated on.  Patient also had A-fib and is currently on Coumadin.  Patient states that he just got out of the hospital about a week ago.  He noticed some blood-tinged urine today.  Denies any blood in stool.  He denies any clots in his urine.  Denies any flank pain or abdominal pain.  His INR was 2.8 yesterday and was told that he needs to come to the ER for further evaluation.  The history is provided by the patient.       Home Medications Prior to Admission medications   Medication Sig Start Date End Date Taking? Authorizing Provider  acetaminophen (TYLENOL) 500 MG tablet Take 2 tablets (1,000 mg total) by mouth every 8 (eight) hours as needed for mild pain. 03/25/23 03/24/24  Morene Crocker, MD  amiodarone (PACERONE) 200 MG tablet Amiodarone 400 mg (two tablets) twice daily for 1 week, THEN 200 twice daily until seen in office 05/28/23   Tobb, Kardie, DO  aspirin EC 81 MG tablet Take 1 tablet (81 mg total) by mouth daily. Swallow whole. 05/14/23   Leary Roca, PA-C  cyclobenzaprine (FLEXERIL) 5 MG tablet Take 5 mg by mouth 3 (three) times daily as needed for muscle spasms.    [provider]  diclofenac Sodium (VOLTAREN) 1 % GEL Apply 2 g topically 4 (four) times daily. 12/23/22   Evlyn Kanner, MD  furosemide (LASIX) 20 MG tablet Take 1 tablet (20 mg total) by mouth daily, for 3 days then call provider for further instructions 05/17/23   Tobb, Kardie, DO  metoprolol succinate (TOPROL-XL) 25 MG 24 hr tablet Take 1 tablet (25 mg total) by mouth every 12 (twelve) hours. 05/14/23   Leary Roca, PA-C  rosuvastatin (CRESTOR) 5 MG tablet Take 1 tablet (5 mg total) by  mouth daily. 09/23/22   Masters, Katie, DO  sildenafil (VIAGRA) 25 MG tablet Take 100 mg by mouth daily as needed for erectile dysfunction.    [provider]  Vitamin D, Ergocalciferol, (DRISDOL) 1.25 MG (50000 UNIT) CAPS capsule Take 1 capsule (50,000 Units total) by mouth every 7 (seven) days. 11/19/22   Tobb, Kardie, DO  warfarin (COUMADIN) 2.5 MG tablet Take 2 tablets (5 mg total) by mouth daily. Or as directed by the Coumadin Clinic 05/14/23 05/13/24  Leary Roca, PA-C      Allergies    Patient has no known allergies.    Review of Systems   Review of Systems  Genitourinary:  Positive for hematuria.  All other systems reviewed and are negative.   Physical Exam Updated Vital Signs BP 122/75 (BP Location: Left Arm)   Pulse 78   Temp 98.1 F (36.7 C) (Oral)   Resp 20   Ht 6' (1.829 m)   Wt 103.9 kg   SpO2 99%   BMI 31.06 kg/m  Physical Exam Vitals and nursing note reviewed.  Constitutional:      Appearance: Normal appearance.  HENT:     Head: Normocephalic.     Nose: Nose normal.     Mouth/Throat:     Mouth: Mucous membranes are moist.  Eyes:     Extraocular Movements: Extraocular  movements intact.     Pupils: Pupils are equal, round, and reactive to light.  Cardiovascular:     Rate and Rhythm: Normal rate and regular rhythm.  Pulmonary:     Effort: Pulmonary effort is normal.     Breath sounds: Normal breath sounds.     Comments: Large midline surgical scar that is healing well Abdominal:     General: Abdomen is flat.     Palpations: Abdomen is soft.     Comments: No suprapubic tenderness and no CVA tenderness  Musculoskeletal:        General: Normal range of motion.     Cervical back: Normal range of motion and neck supple.  Skin:    General: Skin is warm.     Capillary Refill: Capillary refill takes less than 2 seconds.  Neurological:     General: No focal deficit present.     Mental Status: He is alert and oriented to person, place, and  time.  Psychiatric:        Mood and Affect: Mood normal.        Behavior: Behavior normal.     ED Results / Procedures / Treatments   Labs (all labs ordered are listed, but only abnormal results are displayed) Labs Reviewed  URINALYSIS, ROUTINE W REFLEX MICROSCOPIC - Abnormal; Notable for the following components:      Result Value   Color, Urine BROWN (*)    APPearance CLOUDY (*)    Hgb urine dipstick LARGE (*)    Protein, ur 100 (*)    All other components within normal limits  BASIC METABOLIC PANEL - Abnormal; Notable for the following components:   Sodium 132 (*)    Glucose, Bld 151 (*)    Creatinine, Ser 1.40 (*)    Calcium 8.8 (*)    All other components within normal limits  CBC - Abnormal; Notable for the following components:   Platelets 540 (*)    All other components within normal limits  URINALYSIS, MICROSCOPIC (REFLEX) - Abnormal; Notable for the following components:   Bacteria, UA FEW (*)    All other components within normal limits    EKG None  Radiology No results found.  Procedures Procedures    Medications Ordered in ED Medications - No data to display  ED Course/ Medical Decision Making/ A&P                                 Medical Decision Making Timothy Browning is a 52 y.o. male history of aortic aneurysm with repair and on Coumadin for A-fib here presenting with hematuria.  Patient's INR was 2.8 yesterday.  Patient had a recent CT scan that showed a renal stone but he has no flank pain to suggest renal colic.  Also on that CT scan there was no renal mass or bladder mass.  I think he likely has hematuria from being on Coumadin.  Plan to get CBC and BMP and UA.  7:07 PM CBC and BMP stable.  UA showed large hematuria.  He states that he is urinating well.  Patient has no clots and is not in retention.  His postvoid is 0.  He does not need Foley at this point.  I recommend that he stay hydrated.  If he has trouble urinating or large clots he  needs to go to the [facility] long hospital and he may need to follow-up with urology.  Problems Addressed: Gross hematuria: acute illness or injury  Amount and/or Complexity of Data Reviewed Labs: ordered. Decision-making details documented in ED Course.    Final Clinical Impression(s) / ED Diagnoses Final diagnoses:  None    Rx / DC Orders ED Discharge Orders     None         Charlynne Pander, MD 05/29/23 Windell Moment

## 2023-05-31 ENCOUNTER — Other Ambulatory Visit (HOSPITAL_COMMUNITY): Payer: Self-pay

## 2023-05-31 ENCOUNTER — Other Ambulatory Visit: Payer: Self-pay

## 2023-05-31 DIAGNOSIS — Z79899 Other long term (current) drug therapy: Secondary | ICD-10-CM

## 2023-05-31 DIAGNOSIS — R899 Unspecified abnormal finding in specimens from other organs, systems and tissues: Secondary | ICD-10-CM

## 2023-05-31 NOTE — Progress Notes (Signed)
Orders for repeat labs placed per Dr. Servando Salina.

## 2023-06-01 ENCOUNTER — Telehealth (HOSPITAL_COMMUNITY): Payer: Self-pay

## 2023-06-01 ENCOUNTER — Ambulatory Visit: Payer: Medicaid Other | Attending: Cardiovascular Disease | Admitting: *Deleted

## 2023-06-01 DIAGNOSIS — I480 Paroxysmal atrial fibrillation: Secondary | ICD-10-CM

## 2023-06-01 DIAGNOSIS — Z952 Presence of prosthetic heart valve: Secondary | ICD-10-CM | POA: Diagnosis not present

## 2023-06-01 LAB — POCT INR: POC INR: 4.6

## 2023-06-01 NOTE — Patient Instructions (Addendum)
Description   Hold warfarin today and tomorrow then START taking warfarin  2 tablets daily except for 1 tablet  on Tuesday and Fridays. Recheck INR in 1 week. Stay consistent with greens each week. Coumadin Clinic [phone] On 8/9 pt STARTED taking amio 400mg  BIDX1 week and then is going to 200mg  BID.

## 2023-06-01 NOTE — Telephone Encounter (Signed)
Pt insurance is active through [place]. Ref# [id number]   Will fax over [facility] Reimbursement form to Dr. Servando Salina

## 2023-06-02 ENCOUNTER — Other Ambulatory Visit: Payer: Self-pay

## 2023-06-02 ENCOUNTER — Ambulatory Visit: Payer: Medicaid Other | Admitting: Student

## 2023-06-02 ENCOUNTER — Encounter: Payer: Self-pay | Admitting: Student

## 2023-06-02 ENCOUNTER — Other Ambulatory Visit (HOSPITAL_COMMUNITY): Payer: Self-pay

## 2023-06-02 VITALS — BP 129/86 | HR 70 | Temp 98.3°F | Ht 72.0 in | Wt 227.3 lb

## 2023-06-02 DIAGNOSIS — K5900 Constipation, unspecified: Secondary | ICD-10-CM | POA: Diagnosis not present

## 2023-06-02 DIAGNOSIS — Z952 Presence of prosthetic heart valve: Secondary | ICD-10-CM

## 2023-06-02 DIAGNOSIS — R319 Hematuria, unspecified: Secondary | ICD-10-CM | POA: Insufficient documentation

## 2023-06-02 DIAGNOSIS — R310 Gross hematuria: Secondary | ICD-10-CM

## 2023-06-02 DIAGNOSIS — E785 Hyperlipidemia, unspecified: Secondary | ICD-10-CM

## 2023-06-02 MED ORDER — POLYETHYLENE GLYCOL 3350 17 GM/SCOOP PO POWD
1.0000 | Freq: Once | ORAL | 0 refills | Status: AC
Start: 2023-06-02 — End: 2023-06-03
  Filled 2023-06-02: qty 238, 1d supply, fill #0

## 2023-06-02 NOTE — Assessment & Plan Note (Signed)
Patient endorses 3 day history of constipation. No prior history and no other symptoms. -Order Miralax

## 2023-06-02 NOTE — Assessment & Plan Note (Signed)
Patient seen in the ED 8/10 for Hematuria. Benign ED work-up aside from an asymptomatic renal calculus. Patient denies hematuria since ED visit. Patient does take Coumadin with therapeutic INR, but this should not cause hematuria regardless. Patient has seen Urology in the past for symptoms of BPH and elevated PSA but has been unable to follow-up now that he is not self-pay. -Order Urology referral to separate location

## 2023-06-02 NOTE — Assessment & Plan Note (Signed)
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 Following with Dr. Cliffton Asters who had telephone encounter 8/9. Patient endorses pounding heart beat radiating to neck. This was mentioned to Dr. Cliffton Asters who provided reassurance, but patient states it is slightly worse. Certainly a bounding pulse on exam with audible click heard without stethoscope. Vitals unremarkable. -Secure chat sent to Dr. Cliffton Asters and appreciate recommendations

## 2023-06-02 NOTE — Progress Notes (Signed)
CC: Follow-up  HPI:  [name] is a 52 y.o. male living with a history stated below and presents today for a follow-up. Please see problem based assessment and plan for additional details.  Past Medical History:  Diagnosis Date   Aortic aneurysm without rupture (HCC)    Chest pain of uncertain etiology 08/11/2021   CKD (chronic kidney disease)    GERD (gastroesophageal reflux disease)    Heart failure (HCC)    Hypertension    Obesity (BMI 30-39.9) 08/11/2021   Sleep apnea     Current Outpatient Medications on File Prior to Visit  Medication Sig Dispense Refill   acetaminophen (TYLENOL) 500 MG tablet Take 2 tablets (1,000 mg total) by mouth every 8 (eight) hours as needed for mild pain. 100 tablet 2   amiodarone (PACERONE) 200 MG tablet Amiodarone 400 mg (two tablets) twice daily for 1 week, THEN 200 twice daily until seen in office 90 tablet 0   aspirin EC 81 MG tablet Take 1 tablet (81 mg total) by mouth daily. Swallow whole. 30 tablet 12   cyclobenzaprine (FLEXERIL) 5 MG tablet Take 5 mg by mouth 3 (three) times daily as needed for muscle spasms.     diclofenac Sodium (VOLTAREN) 1 % GEL Apply 2 g topically 4 (four) times daily. 100 g 0   furosemide (LASIX) 20 MG tablet Take 1 tablet (20 mg total) by mouth daily, for 3 days then call provider for further instructions 30 tablet 0   metoprolol succinate (TOPROL-XL) 25 MG 24 hr tablet Take 1 tablet (25 mg total) by mouth every 12 (twelve) hours. 60 tablet 2   rosuvastatin (CRESTOR) 5 MG tablet Take 1 tablet (5 mg total) by mouth daily. 90 tablet 3   sildenafil (VIAGRA) 25 MG tablet Take 100 mg by mouth daily as needed for erectile dysfunction.     Vitamin D, Ergocalciferol, (DRISDOL) 1.25 MG (50000 UNIT) CAPS capsule Take 1 capsule (50,000 Units total) by mouth every 7 (seven) days. 12 capsule 0   warfarin (COUMADIN) 2.5 MG tablet Take 2 tablets (5 mg total) by mouth daily. Or as directed by the Coumadin Clinic 60 tablet 11    No current facility-administered medications on file prior to visit.    Family History  Problem Relation Age of Onset   Cancer Sister    Hearing loss Maternal Grandmother    Cancer Maternal Grandfather     Social History   Socioeconomic History   Marital status: Single    Spouse name: n/a   Number of children: 3   Years of education: Not on file   Highest education level: Not on file  Occupational History   Occupation: Education administrator    Comment: unemployed  Tobacco Use   Smoking status: Never   Smokeless tobacco: Never  Vaping Use   Vaping status: Never Used  Substance and Sexual Activity   Alcohol use: Yes    Comment: weekends, 1-2 drinks   Drug use: No   Sexual activity: Not on file  Other Topics Concern   Not on file  Social History Narrative   Not on file   Social Determinants of Health   Financial Resource Strain: Not on file  Food Insecurity: No Food Insecurity (05/06/2023)   Hunger Vital Sign    Worried About Running Out of Food in the Last Year: Never true    Ran Out of Food in the Last Year: Never true  Transportation Needs: No Transportation Needs (05/06/2023)  PRAPARE - Administrator, Civil Service (Medical): No    Lack of Transportation (Non-Medical): No  Physical Activity: Not on file  Stress: Not on file  Social Connections: Unknown (03/19/2023)   Received from [facility]   Social Network    Social Network: Not on file  Intimate Partner Violence: Not At Risk (05/06/2023)   Humiliation, Afraid, Rape, and Kick questionnaire    Fear of Current or Ex-Partner: No    Emotionally Abused: No    Physically Abused: No    Sexually Abused: No    Review of Systems: ROS negative except for what is noted on the assessment and plan.  Vitals:   06/02/23 1009 06/02/23 1054  BP: (!) 151/90 129/86  Pulse: 77 70  Temp: 98.3 F (36.8 C)   TempSrc: Oral   SpO2: 100%   Weight: 227 lb 4.8 oz (103.1 kg)   Height: 6' (1.829 m)      Physical Exam: Constitutional: well-appearing, sitting in chair, in no acute distress Cardiovascular: loud S1/S2 with audible click, regular rate and rhythm, no m/r/g Pulmonary/Chest: normal work of breathing on room air, lungs clear to auscultation bilaterally Skin: warm and dry Psych: normal mood and behavior  Assessment & Plan:     Patient seen with Dr. Cleda Daub  Hematuria Patient seen in the ED 8/10 for Hematuria. Benign ED work-up aside from an asymptomatic renal calculus. Patient denies hematuria since ED visit. Patient does take Coumadin with therapeutic INR, but this should not cause hematuria regardless. Patient has seen Urology in the past for symptoms of BPH and elevated PSA but has been unable to follow-up now that he is not self-pay. -Order Urology referral to separate location  Hyperlipidemia LDL 10 months ago was 132. Managed compliantly with Crestor 5 mg. Patient with multiple cardiac risk factors. -Order Lipid Panel  Constipation Patient endorses 3 day history of constipation. No prior history and no other symptoms. -Order Miralax  Aortic valve replaced Patient had BENTALL PROCEDURE USING SJM AORTIC VALVED GRAFT SIZE on 7/18. Following with Dr. Cliffton Asters who had telephone encounter 8/9. Patient endorses pounding heart beat radiating to neck. This was mentioned to Dr. Cliffton Asters who provided reassurance, but patient states it is slightly worse. Certainly a bounding pulse on exam with audible click heard without stethoscope. Vitals unremarkable. -Secure chat sent to Dr. Cliffton Asters and appreciate recommendations   Carmina Miller, D.O. [facility] Internal Medicine, PGY-1 Phone: [phone] Date 06/02/2023 Time 5:26 PM

## 2023-06-02 NOTE — Assessment & Plan Note (Addendum)
LDL 10 months ago was 132. Managed compliantly with Crestor 5 mg. Patient with multiple cardiac risk factors. -Order Lipid Panel

## 2023-06-02 NOTE — Patient Instructions (Addendum)
Thank you for allowing me to be a part of your care team. Today we discussed a few things:  I sent in a referral to Urology that will be requested somewhere other than [facility]. I sent in a prescription for Miralax I will call you about your Lipid panel results.

## 2023-06-04 ENCOUNTER — Encounter: Payer: Self-pay | Admitting: Cardiology

## 2023-06-04 LAB — LIPID PANEL
Chol/HDL Ratio: 2.2 ratio (ref 0.0–5.0)
Cholesterol, Total: 133 mg/dL (ref 100–199)
HDL: 61 mg/dL (ref >39)
LDL Chol Calc (NIH): 59 mg/dL (ref 0–99)
Triglycerides: 62 mg/dL (ref 0–149)
VLDL Cholesterol Cal: 13 mg/dL (ref 5–40)

## 2023-06-08 ENCOUNTER — Ambulatory Visit: Payer: Medicaid Other | Admitting: Cardiology

## 2023-06-08 ENCOUNTER — Ambulatory Visit: Payer: Medicaid Other | Attending: Thoracic Surgery (Cardiothoracic Vascular Surgery) | Admitting: *Deleted

## 2023-06-08 ENCOUNTER — Other Ambulatory Visit: Payer: Self-pay

## 2023-06-08 ENCOUNTER — Other Ambulatory Visit (HOSPITAL_COMMUNITY): Payer: Self-pay

## 2023-06-08 DIAGNOSIS — I4891 Unspecified atrial fibrillation: Secondary | ICD-10-CM | POA: Diagnosis not present

## 2023-06-08 DIAGNOSIS — I480 Paroxysmal atrial fibrillation: Secondary | ICD-10-CM | POA: Diagnosis not present

## 2023-06-08 DIAGNOSIS — Z952 Presence of prosthetic heart valve: Secondary | ICD-10-CM

## 2023-06-08 LAB — POCT INR: INR: 2.1 (ref 2.0–3.0)

## 2023-06-08 NOTE — Patient Instructions (Addendum)
Description   Today take 2 tablets of warfarin then START taking warfarin 2 tablets daily except for 1 tablet  on Tuesday. Recheck INR in 1 week. Stay consistent with greens each week-2 to 3 times per week. Coumadin Clinic [phone] On 8/9 pt STARTED taking amio 400mg  BIDX1 week and then is going to 200mg  BID.

## 2023-06-09 NOTE — Progress Notes (Signed)
Internal Medicine Clinic Attending  I was physically present during the key portions of the resident provided service and participated in the medical decision making of patient's management care. I reviewed pertinent patient test results.  The assessment, diagnosis, and plan were formulated together and I agree with the documentation in the resident's note.  Gust Rung, DO

## 2023-06-15 ENCOUNTER — Ambulatory Visit: Payer: Medicaid Other | Attending: Cardiovascular Disease

## 2023-06-15 DIAGNOSIS — Z952 Presence of prosthetic heart valve: Secondary | ICD-10-CM

## 2023-06-15 DIAGNOSIS — I480 Paroxysmal atrial fibrillation: Secondary | ICD-10-CM | POA: Diagnosis not present

## 2023-06-15 DIAGNOSIS — Z5181 Encounter for therapeutic drug level monitoring: Secondary | ICD-10-CM

## 2023-06-15 LAB — POCT INR: INR: 2.3 (ref 2.0–3.0)

## 2023-06-15 MED ORDER — METOPROLOL SUCCINATE ER 25 MG PO TB24: 12.5000 mg | ORAL_TABLET | Freq: Two times a day (BID) | ORAL | 2 refills | Status: DC

## 2023-06-15 NOTE — Patient Instructions (Signed)
INCREASE TO 2 TABLETS DAILY.  INR in 2 weeks. Stay consistent with greens each week-2 to 3 times per week. Coumadin Clinic [phone] On 8/9 pt STARTED taking amio 400mg  BIDX1 week and then is going to 200mg  BID.

## 2023-06-20 DIAGNOSIS — Z419 Encounter for procedure for purposes other than remedying health state, unspecified: Secondary | ICD-10-CM | POA: Diagnosis not present

## 2023-06-20 DIAGNOSIS — G4733 Obstructive sleep apnea (adult) (pediatric): Secondary | ICD-10-CM | POA: Diagnosis not present

## 2023-06-23 ENCOUNTER — Telehealth: Payer: Self-pay

## 2023-06-23 MED ORDER — METOPROLOL SUCCINATE ER 25 MG PO TB24: 12.5000 mg | ORAL_TABLET | Freq: Two times a day (BID) | ORAL | Status: DC

## 2023-06-23 NOTE — Telephone Encounter (Signed)
Medication list updated per Dr. Mallory Shirk message from 8/27. See chart.

## 2023-06-24 ENCOUNTER — Other Ambulatory Visit: Payer: Self-pay | Admitting: Thoracic Surgery (Cardiothoracic Vascular Surgery)

## 2023-06-24 DIAGNOSIS — Z952 Presence of prosthetic heart valve: Secondary | ICD-10-CM

## 2023-06-25 ENCOUNTER — Ambulatory Visit
Admission: RE | Admit: 2023-06-25 | Discharge: 2023-06-25 | Disposition: A | Discharge status: Home / Self Care | Payer: Medicaid Other | Source: Ambulatory Visit | Attending: Thoracic Surgery (Cardiothoracic Vascular Surgery) | Admitting: Thoracic Surgery (Cardiothoracic Vascular Surgery)

## 2023-06-25 ENCOUNTER — Ambulatory Visit (INDEPENDENT_AMBULATORY_CARE_PROVIDER_SITE_OTHER): Payer: Self-pay | Admitting: Thoracic Surgery (Cardiothoracic Vascular Surgery)

## 2023-06-25 ENCOUNTER — Encounter: Payer: Self-pay | Admitting: Thoracic Surgery (Cardiothoracic Vascular Surgery)

## 2023-06-25 ENCOUNTER — Other Ambulatory Visit: Payer: Self-pay

## 2023-06-25 VITALS — BP 150/85 | HR 75 | Resp 18 | Ht 72.0 in | Wt 231.0 lb

## 2023-06-25 DIAGNOSIS — Z952 Presence of prosthetic heart valve: Secondary | ICD-10-CM

## 2023-06-25 DIAGNOSIS — I7121 Aneurysm of the ascending aorta, without rupture: Secondary | ICD-10-CM

## 2023-06-25 NOTE — Progress Notes (Signed)
      [address]       [place] [address]             [phone]        TAIMAK PARDEE [facility] Medical Record #[id number] Date of Birth: Aug 11, 1971  Referring: Thomasene Ripple, DO Primary Care: Marrianne Mood, MD Primary Cardiologist:Kardie Tobb, DO  Reason for visit:   follow-up  History of Present Illness:     52 year old male presents for his 1 month follow-up appointment.  Overall he is doing well.  They have been titrating his blood pressure medication.  He only complains of some stiffness in his chest.  Physical Exam: BP (!) 150/85   Pulse 75   Resp 18   Ht 6' (1.829 m)   Wt 231 lb (104.8 kg)   SpO2 96% Comment: RA  BMI 31.33 kg/m   Alert NAD Incision clean.  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  He is overall doing well.  He is cleared for cardiac rehab.  He will continue to follow-up with cardiology.   Corliss Skains 06/25/2023 10:58 AM

## 2023-06-25 NOTE — Progress Notes (Signed)
Referral placed to [facility] per Dr. Lucilla Lame request.

## 2023-06-29 ENCOUNTER — Ambulatory Visit: Payer: Medicaid Other | Attending: Cardiology | Admitting: *Deleted

## 2023-06-29 DIAGNOSIS — I480 Paroxysmal atrial fibrillation: Secondary | ICD-10-CM | POA: Diagnosis not present

## 2023-06-29 DIAGNOSIS — Z952 Presence of prosthetic heart valve: Secondary | ICD-10-CM | POA: Diagnosis not present

## 2023-06-29 DIAGNOSIS — I4891 Unspecified atrial fibrillation: Secondary | ICD-10-CM | POA: Diagnosis not present

## 2023-06-29 LAB — POCT INR: INR: 2.9 (ref 2.0–3.0)

## 2023-06-29 NOTE — Patient Instructions (Signed)
Description   Continue taking warfarin  2 TABLETS DAILY.  INR in 2 weeks. Stay consistent with greens each week-2 to 3 times per week. Coumadin Clinic [phone] On 8/9 pt STARTED taking amio 400mg  BIDX1 week and then is going to 200mg  BID.

## 2023-07-04 ENCOUNTER — Emergency Department (HOSPITAL_COMMUNITY): Payer: Medicaid Other

## 2023-07-04 ENCOUNTER — Other Ambulatory Visit: Payer: Self-pay

## 2023-07-04 ENCOUNTER — Inpatient Hospital Stay (HOSPITAL_COMMUNITY)
Admission: EM | Admit: 2023-07-04 | Discharge: 2023-07-09 | DRG: 356 | Disposition: A | Discharge status: Home / Self Care | Payer: Medicaid Other | Attending: Internal Medicine | Admitting: Internal Medicine

## 2023-07-04 ENCOUNTER — Encounter (HOSPITAL_COMMUNITY): Payer: Self-pay

## 2023-07-04 DIAGNOSIS — Z7901 Long term (current) use of anticoagulants: Secondary | ICD-10-CM | POA: Diagnosis not present

## 2023-07-04 DIAGNOSIS — N189 Chronic kidney disease, unspecified: Secondary | ICD-10-CM | POA: Diagnosis not present

## 2023-07-04 DIAGNOSIS — D72829 Elevated white blood cell count, unspecified: Secondary | ICD-10-CM | POA: Diagnosis present

## 2023-07-04 DIAGNOSIS — I480 Paroxysmal atrial fibrillation: Secondary | ICD-10-CM

## 2023-07-04 DIAGNOSIS — K5791 Diverticulosis of intestine, part unspecified, without perforation or abscess with bleeding: Principal | ICD-10-CM

## 2023-07-04 DIAGNOSIS — I4891 Unspecified atrial fibrillation: Secondary | ICD-10-CM | POA: Diagnosis present

## 2023-07-04 DIAGNOSIS — Z79899 Other long term (current) drug therapy: Secondary | ICD-10-CM

## 2023-07-04 DIAGNOSIS — D62 Acute posthemorrhagic anemia: Secondary | ICD-10-CM | POA: Diagnosis not present

## 2023-07-04 DIAGNOSIS — D120 Benign neoplasm of cecum: Secondary | ICD-10-CM

## 2023-07-04 DIAGNOSIS — Z8679 Personal history of other diseases of the circulatory system: Secondary | ICD-10-CM

## 2023-07-04 DIAGNOSIS — E669 Obesity, unspecified: Secondary | ICD-10-CM | POA: Diagnosis not present

## 2023-07-04 DIAGNOSIS — Z8719 Personal history of other diseases of the digestive system: Secondary | ICD-10-CM

## 2023-07-04 DIAGNOSIS — K640 First degree hemorrhoids: Secondary | ICD-10-CM | POA: Diagnosis present

## 2023-07-04 DIAGNOSIS — I2510 Atherosclerotic heart disease of native coronary artery without angina pectoris: Secondary | ICD-10-CM | POA: Diagnosis present

## 2023-07-04 DIAGNOSIS — D125 Benign neoplasm of sigmoid colon: Secondary | ICD-10-CM

## 2023-07-04 DIAGNOSIS — R571 Hypovolemic shock: Secondary | ICD-10-CM | POA: Diagnosis not present

## 2023-07-04 DIAGNOSIS — I130 Hypertensive heart and chronic kidney disease with heart failure and stage 1 through stage 4 chronic kidney disease, or unspecified chronic kidney disease: Secondary | ICD-10-CM | POA: Diagnosis present

## 2023-07-04 DIAGNOSIS — Z9081 Acquired absence of spleen: Secondary | ICD-10-CM

## 2023-07-04 DIAGNOSIS — D6832 Hemorrhagic disorder due to extrinsic circulating anticoagulants: Secondary | ICD-10-CM | POA: Diagnosis not present

## 2023-07-04 DIAGNOSIS — N1831 Chronic kidney disease, stage 3a: Secondary | ICD-10-CM | POA: Diagnosis not present

## 2023-07-04 DIAGNOSIS — D509 Iron deficiency anemia, unspecified: Secondary | ICD-10-CM | POA: Diagnosis not present

## 2023-07-04 DIAGNOSIS — N183 Chronic kidney disease, stage 3 unspecified: Secondary | ICD-10-CM | POA: Diagnosis present

## 2023-07-04 DIAGNOSIS — T45515A Adverse effect of anticoagulants, initial encounter: Secondary | ICD-10-CM | POA: Diagnosis present

## 2023-07-04 DIAGNOSIS — I959 Hypotension, unspecified: Secondary | ICD-10-CM | POA: Diagnosis present

## 2023-07-04 DIAGNOSIS — K5731 Diverticulosis of large intestine without perforation or abscess with bleeding: Secondary | ICD-10-CM | POA: Diagnosis not present

## 2023-07-04 DIAGNOSIS — K921 Melena: Secondary | ICD-10-CM

## 2023-07-04 DIAGNOSIS — E871 Hypo-osmolality and hyponatremia: Secondary | ICD-10-CM | POA: Diagnosis not present

## 2023-07-04 DIAGNOSIS — G4733 Obstructive sleep apnea (adult) (pediatric): Secondary | ICD-10-CM | POA: Diagnosis not present

## 2023-07-04 DIAGNOSIS — K5790 Diverticulosis of intestine, part unspecified, without perforation or abscess without bleeding: Secondary | ICD-10-CM | POA: Diagnosis not present

## 2023-07-04 DIAGNOSIS — K5792 Diverticulitis of intestine, part unspecified, without perforation or abscess without bleeding: Secondary | ICD-10-CM | POA: Diagnosis not present

## 2023-07-04 DIAGNOSIS — K633 Ulcer of intestine: Secondary | ICD-10-CM

## 2023-07-04 DIAGNOSIS — E785 Hyperlipidemia, unspecified: Secondary | ICD-10-CM | POA: Diagnosis not present

## 2023-07-04 DIAGNOSIS — K635 Polyp of colon: Secondary | ICD-10-CM | POA: Diagnosis not present

## 2023-07-04 DIAGNOSIS — R933 Abnormal findings on diagnostic imaging of other parts of digestive tract: Secondary | ICD-10-CM | POA: Diagnosis not present

## 2023-07-04 DIAGNOSIS — Z6831 Body mass index (BMI) 31.0-31.9, adult: Secondary | ICD-10-CM | POA: Diagnosis not present

## 2023-07-04 DIAGNOSIS — Z5181 Encounter for therapeutic drug level monitoring: Secondary | ICD-10-CM

## 2023-07-04 DIAGNOSIS — K922 Gastrointestinal hemorrhage, unspecified: Secondary | ICD-10-CM | POA: Diagnosis not present

## 2023-07-04 DIAGNOSIS — K5730 Diverticulosis of large intestine without perforation or abscess without bleeding: Secondary | ICD-10-CM | POA: Diagnosis not present

## 2023-07-04 DIAGNOSIS — K625 Hemorrhage of anus and rectum: Secondary | ICD-10-CM | POA: Diagnosis not present

## 2023-07-04 DIAGNOSIS — Z7982 Long term (current) use of aspirin: Secondary | ICD-10-CM

## 2023-07-04 DIAGNOSIS — I5032 Chronic diastolic (congestive) heart failure: Secondary | ICD-10-CM | POA: Diagnosis not present

## 2023-07-04 DIAGNOSIS — K7689 Other specified diseases of liver: Secondary | ICD-10-CM | POA: Diagnosis not present

## 2023-07-04 DIAGNOSIS — Z952 Presence of prosthetic heart valve: Secondary | ICD-10-CM

## 2023-07-04 DIAGNOSIS — K6389 Other specified diseases of intestine: Secondary | ICD-10-CM | POA: Diagnosis not present

## 2023-07-04 DIAGNOSIS — R509 Fever, unspecified: Secondary | ICD-10-CM | POA: Diagnosis not present

## 2023-07-04 DIAGNOSIS — D124 Benign neoplasm of descending colon: Secondary | ICD-10-CM

## 2023-07-04 DIAGNOSIS — K219 Gastro-esophageal reflux disease without esophagitis: Secondary | ICD-10-CM | POA: Diagnosis present

## 2023-07-04 DIAGNOSIS — I5022 Chronic systolic (congestive) heart failure: Secondary | ICD-10-CM | POA: Diagnosis not present

## 2023-07-04 DIAGNOSIS — I10 Essential (primary) hypertension: Secondary | ICD-10-CM | POA: Diagnosis not present

## 2023-07-04 HISTORY — DX: Personal history of other diseases of the digestive system: Z87.19

## 2023-07-04 HISTORY — PX: IR US GUIDE VASC ACCESS RIGHT: IMG2390

## 2023-07-04 HISTORY — PX: IR ANGIOGRAM SELECTIVE EACH ADDITIONAL VESSEL: IMG667

## 2023-07-04 HISTORY — PX: IR EMBO ART  VEN HEMORR LYMPH EXTRAV  INC GUIDE ROADMAPPING: IMG5450

## 2023-07-04 HISTORY — PX: IR ANGIOGRAM VISCERAL SELECTIVE: IMG657

## 2023-07-04 LAB — CBC
HCT: 41.2 % (ref 39.0–52.0)
Hemoglobin: 13.3 g/dL (ref 13.0–17.0)
MCH: 30.4 pg (ref 26.0–34.0)
MCHC: 32.3 g/dL (ref 30.0–36.0)
MCV: 94.3 fL (ref 80.0–100.0)
Platelets: 319 10*3/uL (ref 150–400)
RBC: 4.37 MIL/uL (ref 4.22–5.81)
RDW: 14.5 % (ref 11.5–15.5)
WBC: 4.3 10*3/uL (ref 4.0–10.5)
nRBC: 0 % (ref 0.0–0.2)

## 2023-07-04 LAB — COMPREHENSIVE METABOLIC PANEL
ALT: 24 U/L (ref 0–44)
AST: 17 U/L (ref 15–41)
Albumin: 3.6 g/dL (ref 3.5–5.0)
Alkaline Phosphatase: 56 U/L (ref 38–126)
Anion gap: 7 (ref 5–15)
BUN: 20 mg/dL (ref 6–20)
CO2: 23 mmol/L (ref 22–32)
Calcium: 8.4 mg/dL — ABNORMAL LOW (ref 8.9–10.3)
Chloride: 104 mmol/L (ref 98–111)
Creatinine, Ser: 1.53 mg/dL — ABNORMAL HIGH (ref 0.61–1.24)
GFR, Estimated: 55 mL/min — ABNORMAL LOW (ref >60)
Glucose, Bld: 126 mg/dL — ABNORMAL HIGH (ref 70–99)
Potassium: 3.7 mmol/L (ref 3.5–5.1)
Sodium: 134 mmol/L — ABNORMAL LOW (ref 135–145)
Total Bilirubin: 0.7 mg/dL (ref 0.3–1.2)
Total Protein: 6.6 g/dL (ref 6.5–8.1)

## 2023-07-04 LAB — CBG MONITORING, ED: Glucose-Capillary: 126 mg/dL — ABNORMAL HIGH (ref 70–99)

## 2023-07-04 LAB — HEMATOCRIT: HCT: 36.6 % — ABNORMAL LOW (ref 39.0–52.0)

## 2023-07-04 LAB — HEMOGLOBIN: Hemoglobin: 11.9 g/dL — ABNORMAL LOW (ref 13.0–17.0)

## 2023-07-04 LAB — PROTIME-INR
INR: 2.5 — ABNORMAL HIGH (ref 0.8–1.2)
Prothrombin Time: 27 s — ABNORMAL HIGH (ref 11.4–15.2)

## 2023-07-04 LAB — PREPARE RBC (CROSSMATCH)

## 2023-07-04 LAB — MRSA NEXT GEN BY PCR, NASAL: MRSA by PCR Next Gen: NOT DETECTED

## 2023-07-04 MED ORDER — MIDAZOLAM HCL 2 MG/2ML IJ SOLN
INTRAMUSCULAR | Status: AC
  Filled 2023-07-04: qty 2

## 2023-07-04 MED ORDER — MUPIROCIN 2 % EX OINT
1.0000 | TOPICAL_OINTMENT | Freq: Two times a day (BID) | CUTANEOUS | Status: DC
  Filled 2023-07-04: qty 22

## 2023-07-04 MED ORDER — LACTATED RINGERS IV BOLUS: 1000.0000 mL | Freq: Once | INTRAVENOUS | Status: AC

## 2023-07-04 MED ORDER — SODIUM CHLORIDE 0.9% FLUSH
3.0000 mL | Freq: Two times a day (BID) | INTRAVENOUS | Status: DC
  Administered 2023-07-04 – 2023-07-09 (×9): 3 mL via INTRAVENOUS

## 2023-07-04 MED ORDER — FENTANYL CITRATE PF 50 MCG/ML IJ SOSY
25.0000 ug | PREFILLED_SYRINGE | INTRAMUSCULAR | Status: DC | PRN
  Administered 2023-07-04: 50 ug via INTRAVENOUS
  Filled 2023-07-04: qty 1

## 2023-07-04 MED ORDER — NITROGLYCERIN IN D5W 100-5 MCG/ML-% IV SOLN
INTRAVENOUS | Status: AC
  Filled 2023-07-04: qty 250

## 2023-07-04 MED ORDER — LACTATED RINGERS IV BOLUS
1000.0000 mL | Freq: Once | INTRAVENOUS | Status: AC
  Administered 2023-07-04: 1000 mL via INTRAVENOUS

## 2023-07-04 MED ORDER — CHLORHEXIDINE GLUCONATE CLOTH 2 % EX PADS
6.0000 | MEDICATED_PAD | Freq: Every day | CUTANEOUS | Status: DC
  Administered 2023-07-04 – 2023-07-09 (×5): 6 via TOPICAL

## 2023-07-04 MED ORDER — AMIODARONE HCL 200 MG PO TABS
200.0000 mg | ORAL_TABLET | Freq: Two times a day (BID) | ORAL | Status: DC
  Administered 2023-07-04 – 2023-07-09 (×10): 200 mg via ORAL
  Filled 2023-07-04 (×11): qty 1

## 2023-07-04 MED ORDER — GELATIN ABSORBABLE 12-7 MM EX MISC
CUTANEOUS | Status: AC
  Filled 2023-07-04: qty 1

## 2023-07-04 MED ORDER — ACETAMINOPHEN 650 MG RE SUPP: 650.0000 mg | Freq: Four times a day (QID) | RECTAL | Status: DC | PRN

## 2023-07-04 MED ORDER — SODIUM CHLORIDE 0.9% IV SOLUTION: Freq: Once | INTRAVENOUS | Status: AC

## 2023-07-04 MED ORDER — LIDOCAINE HCL 1 % IJ SOLN
INTRAMUSCULAR | Status: AC
  Filled 2023-07-04: qty 20

## 2023-07-04 MED ORDER — MIDAZOLAM HCL 2 MG/2ML IJ SOLN
INTRAMUSCULAR | Status: AC | PRN
Start: 2023-07-04 — End: 2023-07-04
  Administered 2023-07-04: 2 mg via INTRAVENOUS

## 2023-07-04 MED ORDER — ONDANSETRON HCL 4 MG/2ML IJ SOLN: 4.0000 mg | Freq: Four times a day (QID) | INTRAMUSCULAR | Status: DC | PRN

## 2023-07-04 MED ORDER — CHLORHEXIDINE GLUCONATE CLOTH 2 % EX PADS: 6.0000 | MEDICATED_PAD | Freq: Every day | CUTANEOUS | Status: DC

## 2023-07-04 MED ORDER — IOHEXOL 300 MG/ML  SOLN
100.0000 mL | Freq: Once | INTRAMUSCULAR | Status: AC | PRN
  Administered 2023-07-04: 20 mL via INTRA_ARTERIAL

## 2023-07-04 MED ORDER — FENTANYL CITRATE (PF) 100 MCG/2ML IJ SOLN
INTRAMUSCULAR | Status: AC | PRN
  Administered 2023-07-04: 50 ug via INTRAVENOUS

## 2023-07-04 MED ORDER — ACETAMINOPHEN 325 MG PO TABS
650.0000 mg | ORAL_TABLET | Freq: Four times a day (QID) | ORAL | Status: DC | PRN
  Administered 2023-07-05 – 2023-07-07 (×2): 650 mg via ORAL
  Filled 2023-07-04 (×2): qty 2

## 2023-07-04 MED ORDER — ONDANSETRON HCL 4 MG PO TABS: 4.0000 mg | ORAL_TABLET | Freq: Four times a day (QID) | ORAL | Status: DC | PRN

## 2023-07-04 MED ORDER — PANTOPRAZOLE 80MG IVPB - SIMPLE MED
80.0000 mg | Freq: Once | INTRAVENOUS | Status: AC
  Administered 2023-07-04: 80 mg via INTRAVENOUS
  Filled 2023-07-04: qty 80

## 2023-07-04 MED ORDER — LACTATED RINGERS IV SOLN: INTRAVENOUS | Status: AC

## 2023-07-04 MED ORDER — IOHEXOL 350 MG/ML SOLN
100.0000 mL | Freq: Once | INTRAVENOUS | Status: AC | PRN
  Administered 2023-07-04: 100 mL via INTRAVENOUS

## 2023-07-04 MED ORDER — IOHEXOL 300 MG/ML  SOLN
100.0000 mL | Freq: Once | INTRAMUSCULAR | Status: AC | PRN
  Administered 2023-07-04: 60 mL via INTRA_ARTERIAL

## 2023-07-04 MED ORDER — FENTANYL CITRATE (PF) 100 MCG/2ML IJ SOLN
INTRAMUSCULAR | Status: AC
  Filled 2023-07-04: qty 2

## 2023-07-04 MED ORDER — PANTOPRAZOLE INFUSION (NEW) - SIMPLE MED
8.0000 mg/h | INTRAVENOUS | Status: DC
  Filled 2023-07-04: qty 100

## 2023-07-04 NOTE — ED Notes (Signed)
Pt remains in IR

## 2023-07-04 NOTE — Plan of Care (Deleted)
Discussed with patient plan of care for the evening, admission questions and insulin pump with some teach back displayed.  Patient has signed the insulin pump contract.  Charge RN did state that doctor has ordered him to be on an insulin drip and he is refusing to use insulin drip and detach insulin pump.  He was informed by CN that he would have to have that discussion with the MD admitting him.    Problem: Education: Goal: Knowledge of General Education information will improve Description: Including pain rating scale, medication(s)/side effects and non-pharmacologic comfort measures Outcome: Not Progressing   Problem: Health Behavior/Discharge Planning: Goal: Ability to manage health-related needs will improve Outcome: Not Progressing

## 2023-07-04 NOTE — ED Notes (Addendum)
After blood draw, patient stated that he felt dizzy, had blurred vision. BP was checked and found to be 80/61.  Patient was also diaphoretic.  Patient placed in room and provider was at the bedside.  After putting patient in a bed, his BP rose to 94/78. Pt states a slight improvement in symptoms and diaphoresis is no longer present. IV placed and verbal order given by Lorin, PA to start LR bolus.

## 2023-07-04 NOTE — Procedures (Signed)
Interventional Radiology Procedure Note  Procedure: Successful localization and embolization of ascending colonic diverticular bleed.   Complications: None  Estimated Blood Loss: None  Recommendations: - Right femoral access closed with CELT ACD.  - Trend H&H, transfuse as needed - Monitor for any signs of colonic ischemia   Signed,  Sterling Big, MD

## 2023-07-04 NOTE — ED Provider Notes (Addendum)
[place] [facility] Provider Note   CSN: [id number] Arrival date & time: 07/04/23  1102     History  Chief Complaint  Patient presents with   Rectal Bleeding    Timothy Browning is a 52 y.o. male with history of aortic aneurysm s/p Bentall procedure by Dr Cliffton Asters, mechanical aortic valve replacement, post operative afib converted to sinus now on amiodarone, on warfarin, stage 3 CKD, CHF, OSA, HLD, who presents to the ER with rectal bleeding. Has been having bloody stools since this AM.  He has had about 5 episodes of dark red and black liquid filling up the toilet bowl. Last took warfarin last night at 7 pm. After triage patient had episode of feeling very faint and sweaty. Not complaining of any pain.   Rectal Bleeding      Home Medications Prior to Admission medications   Medication Sig Start Date End Date Taking? Authorizing Provider  acetaminophen (TYLENOL) 500 MG tablet Take 2 tablets (1,000 mg total) by mouth every 8 (eight) hours as needed for mild pain. 03/25/23 03/24/24  Morene Crocker, MD  amiodarone (PACERONE) 200 MG tablet Amiodarone 400 mg (two tablets) twice daily for 1 week, THEN 200 twice daily until seen in office 05/28/23   Tobb, Kardie, DO  aspirin EC 81 MG tablet Take 1 tablet (81 mg total) by mouth daily. Swallow whole. 05/14/23   Leary Roca, PA-C  cyclobenzaprine (FLEXERIL) 5 MG tablet Take 5 mg by mouth 3 (three) times daily as needed for muscle spasms.    [provider]  diclofenac Sodium (VOLTAREN) 1 % GEL Apply 2 g topically 4 (four) times daily. 12/23/22   Evlyn Kanner, MD  furosemide (LASIX) 20 MG tablet Take 1 tablet (20 mg total) by mouth daily, for 3 days then call provider for further instructions 05/17/23   Tobb, Kardie, DO  metoprolol succinate (TOPROL-XL) 25 MG 24 hr tablet Take 0.5 tablets (12.5 mg total) by mouth in the morning and at bedtime. 06/23/23   Tobb, Kardie, DO  rosuvastatin  (CRESTOR) 5 MG tablet Take 1 tablet (5 mg total) by mouth daily. 09/23/22   Masters, Katie, DO  sildenafil (VIAGRA) 25 MG tablet Take 100 mg by mouth daily as needed for erectile dysfunction.    [provider]  Vitamin D, Ergocalciferol, (DRISDOL) 1.25 MG (50000 UNIT) CAPS capsule Take 1 capsule (50,000 Units total) by mouth every 7 (seven) days. 11/19/22   Tobb, Kardie, DO  warfarin (COUMADIN) 2.5 MG tablet Take 2 tablets (5 mg total) by mouth daily. Or as directed by the [facility] 05/14/23 05/13/24  Leary Roca, PA-C      Allergies    Ibuprofen-acetaminophen    Review of Systems   Review of Systems  Constitutional:  Positive for diaphoresis and fatigue.  Gastrointestinal:  Positive for blood in stool and hematochezia.  All other systems reviewed and are negative.   Physical Exam Updated Vital Signs BP 110/73 (BP Location: Left Arm)   Pulse 73   Temp 98.2 F (36.8 C) (Oral)   Resp 18   Ht 6' (1.829 m)   Wt 104.8 kg   SpO2 100%   BMI 31.33 kg/m  Physical Exam Vitals and nursing note reviewed.  Constitutional:      Appearance: Normal appearance. He is ill-appearing.  HENT:     Head: Normocephalic and atraumatic.  Eyes:     Conjunctiva/sclera: Conjunctivae normal.  Cardiovascular:     Rate and  Rhythm: Normal rate and regular rhythm.  Pulmonary:     Effort: Pulmonary effort is normal. No respiratory distress.     Breath sounds: Normal breath sounds.  Abdominal:     General: There is no distension.     Palpations: Abdomen is soft.     Tenderness: There is no abdominal tenderness.  Skin:    General: Skin is cool and moist.  Neurological:     General: No focal deficit present.     Mental Status: He is alert.     ED Results / Procedures / Treatments   Labs (all labs ordered are listed, but only abnormal results are displayed) Labs Reviewed  COMPREHENSIVE METABOLIC PANEL - Abnormal; Notable for the following components:      Result Value   Sodium  134 (*)    Glucose, Bld 126 (*)    Creatinine, Ser 1.53 (*)    Calcium 8.4 (*)    GFR, Estimated 55 (*)    All other components within normal limits  PROTIME-INR - Abnormal; Notable for the following components:   Prothrombin Time 27.0 (*)    INR 2.5 (*)    All other components within normal limits  CBG MONITORING, ED - Abnormal; Notable for the following components:   Glucose-Capillary 126 (*)    All other components within normal limits  CBC  POC OCCULT BLOOD, ED  TYPE AND SCREEN    EKG None  Radiology CT ANGIO GI BLEED  Result Date: 07/04/2023 CLINICAL DATA:  Bloody stools since this morning. Patient on Eliquis. Abdominal aortic aneurysm, postop. EXAM: CTA ABDOMEN AND PELVIS WITHOUT AND WITH CONTRAST TECHNIQUE: Multidetector CT imaging of the abdomen and pelvis was performed using the standard protocol during bolus administration of intravenous contrast. Multiplanar reconstructed images and MIPs were obtained and reviewed to evaluate the vascular anatomy. RADIATION DOSE REDUCTION: This exam was performed according to the departmental dose-optimization program which includes automated exposure control, adjustment of the mA and/or kV according to patient size and/or use of iterative reconstruction technique. CONTRAST:  OMNIPAQUE IOHEXOL 350 MG/ML SOLN COMPARISON:  03/21/2023 FINDINGS: VASCULAR Aorta: Normal caliber aorta without aneurysm, dissection, vasculitis or significant stenosis. Celiac: Patent without evidence of aneurysm, dissection, vasculitis or significant stenosis. SMA: Patent without evidence of aneurysm, dissection, vasculitis or significant stenosis. Renals: Both renal arteries are patent without evidence of aneurysm, dissection, vasculitis, fibromuscular dysplasia or significant stenosis. IMA: Patent without evidence of aneurysm, dissection, vasculitis or significant stenosis. Inflow: Patent without evidence of aneurysm, dissection, vasculitis or significant stenosis.  Proximal Outflow: Bilateral common femoral and visualized portions of the superficial and profunda femoral arteries are patent without evidence of aneurysm, dissection, vasculitis or significant stenosis. Veins: No obvious venous abnormality within the limitations of this arterial phase study. Review of the MIP images confirms the above findings. NON-VASCULAR Lower chest: Minimal linear atelectasis/scarring in the lung bases. Median sternotomy wires are present. Partially visualized hardware over the aortic root. Hepatobiliary: Previous cholecystectomy. Subcentimeter cyst over the dome of the liver. Biliary tree is normal. Pancreas: Normal. Spleen: Previous splenectomy. Several small splenic remnants over the left lateral abdomen unchanged. Adrenals/Urinary Tract: Adrenal glands are normal. Kidneys are normal in size without hydronephrosis or nephrolithiasis. Ureters and bladder are unremarkable. Stomach/Bowel: Surgical clips over the proximal stomach. Small bowel is within normal. Appendix is normal. There is significant diverticulosis over the ascending colon as well as the distal descending and sigmoid colon. No definite diverticulitis. The arterial phase images demonstrate hyperdense material within the midportion  of the ascending colon with mild pooling within the dependent portion of the ascending colon as this appears to be originating from a posterior diverticula compatible with acute bleed. The venous phase images again demonstrate this hyperdense material which is more dense and concentrated along the anterior aspect of the ascending colon at the base of several additional diverticula. No evidence of perforation/free peritoneal air. Lymphatic: No adenopathy. Reproductive: Normal. Other: Postsurgical change of the anterior abdominal wall likely from previous hernia repair. Musculoskeletal: Old pelvic fractures. Mild degenerative change of the hips and spine. IMPRESSION: 1. Acute bleed within the midportion  of the ascending colon likely originating from a posterior diverticula, although more concentrated adjacent the base of an anterior diverticula in this same region on the delayed images. No evidence of perforation/free peritoneal air. 2. Significant diverticulosis over the ascending colon as well as the distal descending and sigmoid colon. No acute diverticulitis. 3. Previous cholecystectomy and splenectomy. Subcentimeter hepatic cyst. Critical Value/emergent results were called by telephone at the time of interpretation on 07/04/2023 at 1:07 pm to provider Lauren Roemildt, who verbally acknowledged these results. Electronically Signed   By: Elberta Fortis M.D.   On: 07/04/2023 13:09    Procedures .Critical Care  Performed by: Su Monks, PA-C Authorized by: Su Monks, PA-C   Critical care provider statement:    Critical care time (minutes):  30   Critical care was necessary to treat or prevent imminent or life-threatening deterioration of the following conditions:  Shock   Critical care was time spent personally by me on the following activities:  Development of treatment plan with patient or surrogate, discussions with consultants, evaluation of patient's response to treatment, examination of patient, ordering and review of laboratory studies, ordering and review of radiographic studies, ordering and performing treatments and interventions, pulse oximetry, re-evaluation of patient's condition and review of old charts   Care discussed with: admitting provider   Comments:     Lower GI bleed requiring emergent IR embolization     Medications Ordered in ED Medications  pantoprozole (PROTONIX) 80 mg /NS 100 mL infusion (has no administration in time range)  lactated ringers bolus 1,000 mL (0 mLs Intravenous Stopped 07/04/23 1301)  iohexol (OMNIPAQUE) 350 MG/ML injection 100 mL (100 mLs Intravenous Contrast Given 07/04/23 1219)  pantoprazole (PROTONIX) 80 mg /NS 100 mL IVPB (80 mg  Intravenous New Bag/Given 07/04/23 1253)  midazolam (VERSED) injection (2 mg Intravenous Given 07/04/23 1358)  fentaNYL (SUBLIMAZE) injection (50 mcg Intravenous Given 07/04/23 1358)  iohexol (OMNIPAQUE) 300 MG/ML solution 100 mL (20 mLs Intra-arterial Contrast Given 07/04/23 1506)  iohexol (OMNIPAQUE) 300 MG/ML solution 100 mL (20 mLs Intra-arterial Contrast Given 07/04/23 1505)  iohexol (OMNIPAQUE) 300 MG/ML solution 100 mL (60 mLs Intra-arterial Contrast Given 07/04/23 1505)    ED Course/ Medical Decision Making/ A&P                                 Medical Decision Making Amount and/or Complexity of Data Reviewed Labs: ordered. Radiology: ordered.  Risk Prescription drug management. Decision regarding hospitalization.  This patient is a 52 y.o. male who presents to the ED for concern of rectal bleeding, this involves an extensive number of treatment options, and is a complaint that carries with it a high risk of complications and morbidity. The emergent differential diagnosis prior to evaluation includes, but is not limited to,  Diverticulitis, IBD, colitis, mesenteric ischemia, colorectal cancer /  polyps, hemorrhoids, rectal foreign body, anal fissure. This is not an exhaustive differential.   Past Medical History / Co-morbidities / Social History: aortic aneurysm s/p Bentall procedure by Dr Cliffton Asters, mechanical aortic valve replacement, post operative afib converted to sinus now on amiodarone, on warfarin, stage 3 CKD, CHF, OSA, HLD  Additional history: Chart reviewed. Pertinent results include: Patient recently underwent aortic aneurysm repair and aortic valve replacement with cardiothoracic surgeon Dr. Cliffton Asters on 7/18.    Physical Exam: Physical exam performed. The pertinent findings include: On my initial evaluation patient is diaphoretic, pale, and ill-appearing.  Initial blood pressure was 80/61.  Patient recommended any pain, abdomen soft and nontender.  Heart regular rate and  rhythm.  Lab Tests: I ordered, and personally interpreted labs.  The pertinent results include: No leukocytosis, normal hemoglobin.  CMP with mildly elevated kidney function, creatinine 1.53, GFR 55.  INR 2.5.  Type and screen resulted.   Imaging Studies: I ordered imaging studies including CT angio GI bleed protocol. I independently visualized and interpreted imaging which showed active bleeding in the ascending colon, likely originating from a posterior diverticula.. I agree with the radiologist interpretation.  I was called by radiology with these emergent results.   Cardiac Monitoring:  The patient was maintained on a cardiac monitor.  My attending physician viewed and interpreted the cardiac monitored which showed an underlying rhythm of: Sinus rhythm   Medications: I ordered medication including IV fluids, Protonix for GI bleed and hypotension. Reevaluation of the patient after these medicines showed that the patient improved. I have reviewed the patients home medicines and have made adjustments as needed.  Discussed possibility of starting warfarin reversal. As patient is now hemodynamically stable, is not actively hemorrhaging, feel we can hold off on reversal as he is imminently undergoing embolization with IR. If patient status changes will reconsider.  Consultations Obtained: I consulted with cardiothoracic surgeon Dr Leafy Ro who agreed with warfarin reversal if necessary, did not have other recommendations at this time.   I consulted with interventional radiologist Dr Archer Asa who reviewed patient's imaging and recommends IR embolization. He will mobilize team and get patient consented for procedure.   I consulted with hospitalist Dr Antionette Char who will admit after IR procedure.   Disposition: After consideration of the diagnostic results and the patients response to treatment, I feel that patient is requiring medical admission after IR embolization for diverticular bleeding on  warfarin.   I discussed this case with my attending physician Dr. Eloise Harman who cosigned this note including patient's presenting symptoms, physical exam, and planned diagnostics and interventions. Attending physician stated agreement with plan or made changes to plan which were implemented.    Final Clinical Impression(s) / ED Diagnoses Final diagnoses:  Rectal bleeding  Gastrointestinal hemorrhage associated with intestinal diverticulosis    Rx / DC Orders ED Discharge Orders     None      Portions of this report may have been transcribed using voice recognition software. Every effort was made to ensure accuracy; however, inadvertent computerized transcription errors may be present.    Flara Storti T, PA-C 07/04/23 1526  ADDENDUM 1552 -- patient returned from IR and found to be hypotensive. Feeling faint, BP 76/45. Ordered 1 unit emergency release blood and IVF bolus. Case discussed with attending physician Dr Eloise Harman and hospitalist Dr Antionette Char.    Jeanella Flattery 07/04/23 1706    Rondel Baton, MD 07/04/23 Serena Croissant

## 2023-07-04 NOTE — H&P (Signed)
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 injection confirms active hemorrhage. Coil embolization was performed utilizing a series of 1 and 2 mm low-profile penumbra detachable coils. The microcatheter was brought back. Contrast injection was performed. The embolized vessel is successfully embolized. However, additional bleeding is occurring from a more cephalad branch. The microcatheter was successfully navigated into this branch. Contrast injection was performed confirming active bleeding. Coil embolization was then performed. During the coil embolization there was some extravasation of contrast likely from a coil protruding through the very small arterial wall. Additional coil embolization was performed with successful cessation of hemorrhage. Follow-up contrast injection again performed there is a third tiny distal branch artery still contributing 2 bleeding at the diverticular site. The microcatheter was advanced into this third artery and additional coil embolization was performed. As before, a combination of low-profile penumbra detachable coils in 2 and 3 mm diameters were deployed. Final arteriography  demonstrates cessation bleeding. The catheter system was removed. Hemostasis was attained with the assistance of a Celt arterial closure device. IMPRESSION: Successful embolization of distal branches of the right colic artery which were providing blood flow to the hemorrhaging ascending colonic diverticulum. No evidence of continued bleeding at the end of the procedure. Electronically Signed   By: Malachy Moan M.D.   On: 07/04/2023 15:34   CT ANGIO GI BLEED  Result Date: 07/04/2023 CLINICAL DATA:  Bloody stools since this morning. Patient on Eliquis. Abdominal aortic aneurysm, postop. EXAM: CTA ABDOMEN AND PELVIS WITHOUT AND WITH CONTRAST TECHNIQUE: Multidetector CT imaging of the abdomen and pelvis was performed using the standard protocol during bolus administration of intravenous contrast. Multiplanar reconstructed images and MIPs were obtained and reviewed to evaluate the vascular anatomy. RADIATION DOSE REDUCTION: This exam was performed according to the departmental dose-optimization program which includes automated exposure control, adjustment of the mA and/or kV according to patient size and/or use of iterative reconstruction technique. CONTRAST:  OMNIPAQUE IOHEXOL 350 MG/ML SOLN COMPARISON:  03/21/2023 FINDINGS: VASCULAR Aorta: Normal caliber aorta without aneurysm, dissection, vasculitis or significant stenosis. Celiac: Patent without evidence of aneurysm, dissection, vasculitis or significant stenosis. SMA: Patent without evidence of aneurysm, dissection, vasculitis or significant stenosis. Renals: Both renal arteries  are patent without evidence of aneurysm, dissection, vasculitis, fibromuscular dysplasia or significant stenosis. IMA: Patent without evidence of aneurysm, dissection, vasculitis or significant stenosis. Inflow: Patent without evidence of aneurysm, dissection, vasculitis or significant stenosis. Proximal Outflow: Bilateral common femoral and visualized portions of the  superficial and profunda femoral arteries are patent without evidence of aneurysm, dissection, vasculitis or significant stenosis. Veins: No obvious venous abnormality within the limitations of this arterial phase study. Review of the MIP images confirms the above findings. NON-VASCULAR Lower chest: Minimal linear atelectasis/scarring in the lung bases. Median sternotomy wires are present. Partially visualized hardware over the aortic root. Hepatobiliary: Previous cholecystectomy. Subcentimeter cyst over the dome of the liver. Biliary tree is normal. Pancreas: Normal. Spleen: Previous splenectomy. Several small splenic remnants over the left lateral abdomen unchanged. Adrenals/Urinary Tract: Adrenal glands are normal. Kidneys are normal in size without hydronephrosis or nephrolithiasis. Ureters and bladder are unremarkable. Stomach/Bowel: Surgical clips over the proximal stomach. Small bowel is within normal. Appendix is normal. There is significant diverticulosis over the ascending colon as well as the distal descending and sigmoid colon. No definite diverticulitis. The arterial phase images demonstrate hyperdense material within the midportion of the ascending colon with mild pooling within the dependent portion of the ascending colon as this appears to be originating from a posterior diverticula compatible with acute bleed. The venous phase images again demonstrate this hyperdense material which is more dense and concentrated along the anterior aspect of the ascending colon at the base of several additional diverticula. No evidence of perforation/free peritoneal air. Lymphatic: No adenopathy. Reproductive: Normal. Other: Postsurgical change of the anterior abdominal wall likely from previous hernia repair. Musculoskeletal: Old pelvic fractures. Mild degenerative change of the hips and spine. IMPRESSION: 1. Acute bleed within the midportion of the ascending colon likely originating from a posterior diverticula,  although more concentrated adjacent the base of an anterior diverticula in this same region on the delayed images. No evidence of perforation/free peritoneal air. 2. Significant diverticulosis over the ascending colon as well as the distal descending and sigmoid colon. No acute diverticulitis. 3. Previous cholecystectomy and splenectomy. Subcentimeter hepatic cyst. Critical Value/emergent results were called by telephone at the time of interpretation on 07/04/2023 at 1:07 pm to provider Lauren Roemildt, who verbally acknowledged these results. Electronically Signed   By: Elberta Fortis M.D.   On: 07/04/2023 13:09    EKG: Independently reviewed. Sinus rhythm.   Assessment/Plan   1. Acute diverticular bleeding  - Presents with acute LGIB and near-syncope, now s/p successful embolization of ascending colonic diverticular bleed by IR  - Follow serial H&H, transfuse if needed, monitor for signs of colonic ischemia    2. PAF; mechanical aortic valve  - INR 2.5 on admission  - Hold warfarin for now while following H&H and monitoring for bleeding overnight, repeat INR in am    3. CKD 3A  - SCr is 1.53 on admission, appears to be a little higher than baseline  - Continue IVF hydration, renally-dose medications, repeat chem panel in am    4. OSA  - CPAP while sleeping    DVT prophylaxis: SCDs  Code Status: Full   Level of Care: Level of care: Progressive Family Communication: Wife at bedside   Disposition Plan:  Patient is from: home  Anticipated d/c is to: home  Anticipated d/c date is: 07/06/23  Patient currently: Pending stable H&H Consults called: IR  Admission status: Inpatient     Briscoe Deutscher, MD [facility]  07/04/2023,  3:40 PM

## 2023-07-04 NOTE — Progress Notes (Signed)
   07/04/23 2341  BiPAP/CPAP/SIPAP  BiPAP/CPAP/SIPAP Pt Type Adult  Reason BIPAP/CPAP not in use Non-compliant (PT does use at home, but elects not to use it tonight.)

## 2023-07-04 NOTE — ED Notes (Signed)
Pt with large bloody bowel movement. Large amounts of blood clots noted.
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
## 2023-07-04 NOTE — ED Triage Notes (Signed)
Pt reports bloody stools since this AM, pt has had 5 episodes and states it is liquid filling up the toilet, dark red and black. Pt is on eliquis.

## 2023-07-04 NOTE — ED Notes (Signed)
Blood bank has blood ready for this patient.  Notified Crystal, Charity fundraiser.

## 2023-07-05 DIAGNOSIS — I480 Paroxysmal atrial fibrillation: Secondary | ICD-10-CM | POA: Diagnosis not present

## 2023-07-05 DIAGNOSIS — D62 Acute posthemorrhagic anemia: Secondary | ICD-10-CM

## 2023-07-05 DIAGNOSIS — Z952 Presence of prosthetic heart valve: Secondary | ICD-10-CM

## 2023-07-05 DIAGNOSIS — K5790 Diverticulosis of intestine, part unspecified, without perforation or abscess without bleeding: Secondary | ICD-10-CM | POA: Diagnosis not present

## 2023-07-05 DIAGNOSIS — N1831 Chronic kidney disease, stage 3a: Secondary | ICD-10-CM | POA: Diagnosis not present

## 2023-07-05 DIAGNOSIS — K922 Gastrointestinal hemorrhage, unspecified: Secondary | ICD-10-CM | POA: Diagnosis not present

## 2023-07-05 DIAGNOSIS — G4733 Obstructive sleep apnea (adult) (pediatric): Secondary | ICD-10-CM | POA: Diagnosis not present

## 2023-07-05 DIAGNOSIS — D509 Iron deficiency anemia, unspecified: Secondary | ICD-10-CM | POA: Diagnosis not present

## 2023-07-05 DIAGNOSIS — R933 Abnormal findings on diagnostic imaging of other parts of digestive tract: Secondary | ICD-10-CM | POA: Diagnosis not present

## 2023-07-05 LAB — TYPE AND SCREEN
ABO/RH(D): O POS
Antibody Screen: NEGATIVE
Unit division: 0

## 2023-07-05 LAB — CBC
HCT: 31.6 % — ABNORMAL LOW (ref 39.0–52.0)
Hemoglobin: 10.4 g/dL — ABNORMAL LOW (ref 13.0–17.0)
MCH: 30.3 pg (ref 26.0–34.0)
MCHC: 32.9 g/dL (ref 30.0–36.0)
MCV: 92.1 fL (ref 80.0–100.0)
Platelets: 224 10*3/uL (ref 150–400)
RBC: 3.43 MIL/uL — ABNORMAL LOW (ref 4.22–5.81)
RDW: 14.7 % (ref 11.5–15.5)
WBC: 12.3 10*3/uL — ABNORMAL HIGH (ref 4.0–10.5)
nRBC: 0 % (ref 0.0–0.2)

## 2023-07-05 LAB — BASIC METABOLIC PANEL
Anion gap: 7 (ref 5–15)
Anion gap: 6 (ref 5–15)
BUN: 18 mg/dL (ref 6–20)
BUN: 20 mg/dL (ref 6–20)
CO2: 22 mmol/L (ref 22–32)
CO2: 22 mmol/L (ref 22–32)
Calcium: 7.9 mg/dL — ABNORMAL LOW (ref 8.9–10.3)
Calcium: 7.8 mg/dL — ABNORMAL LOW (ref 8.9–10.3)
Chloride: 105 mmol/L (ref 98–111)
Chloride: 105 mmol/L (ref 98–111)
Creatinine, Ser: 1.31 mg/dL — ABNORMAL HIGH (ref 0.61–1.24)
Creatinine, Ser: 1.48 mg/dL — ABNORMAL HIGH (ref 0.61–1.24)
GFR, Estimated: >60 mL/min (ref >60)
GFR, Estimated: 57 mL/min — ABNORMAL LOW (ref >60)
Glucose, Bld: 127 mg/dL — ABNORMAL HIGH (ref 70–99)
Glucose, Bld: 113 mg/dL — ABNORMAL HIGH (ref 70–99)
Potassium: 4.2 mmol/L (ref 3.5–5.1)
Potassium: 4 mmol/L (ref 3.5–5.1)
Sodium: 134 mmol/L — ABNORMAL LOW (ref 135–145)
Sodium: 133 mmol/L — ABNORMAL LOW (ref 135–145)

## 2023-07-05 LAB — HEMATOCRIT
HCT: 32.8 % — ABNORMAL LOW (ref 39.0–52.0)
HCT: 30.8 % — ABNORMAL LOW (ref 39.0–52.0)

## 2023-07-05 LAB — PROTIME-INR
INR: 2.7 — ABNORMAL HIGH (ref 0.8–1.2)
INR: 2.6 — ABNORMAL HIGH (ref 0.8–1.2)
INR: 2.6 — ABNORMAL HIGH (ref 0.8–1.2)
Prothrombin Time: 28.8 s — ABNORMAL HIGH (ref 11.4–15.2)
Prothrombin Time: 28.3 s — ABNORMAL HIGH (ref 11.4–15.2)
Prothrombin Time: 28.4 seconds — ABNORMAL HIGH (ref 11.4–15.2)

## 2023-07-05 LAB — BPAM RBC
Blood Product Expiration Date: 202410102359
ISSUE DATE / TIME: 202409151725
Unit Type and Rh: 5100

## 2023-07-05 LAB — HEMOGLOBIN
Hemoglobin: 11 g/dL — ABNORMAL LOW (ref 13.0–17.0)
Hemoglobin: 10.3 g/dL — ABNORMAL LOW (ref 13.0–17.0)

## 2023-07-05 MED ORDER — ORAL CARE MOUTH RINSE: 15.0000 mL | OROMUCOSAL | Status: DC | PRN

## 2023-07-05 NOTE — Hospital Course (Addendum)
Timothy Browning is a 52 y.o. male with a history of hypertension, CKD stage IIIa, paroxysmal atrial fibrillation, aortic aneurysm status post Bentall procedure and status post aortic valve replacement with mechanical valve on Coumadin.  Patient presented secondary to rectal bleeding and lightheadedness and was found to have evidence of hematochezia with hypotension  CT angio GI bleed protocol>>showed acute bleed located in the ascending colon secondary to diverticular bleeding. IR performed emergent angiogram with successful localization and embolization of ascending colonic diverticula GI is following.  Since embolization had 1 episode of dark red to stool 9/16 and dark black formed stool 9/17 with a streak of blood. Received 1 unit PRBC 9/15 and Coumadin has been held since 9/15.  Heparin bridging started 9/18 as INR subtherapeutic seen by cardiology, who advised okay to hold off heparin up to 1 week in post aortic valve patient, and during inpatient hospitalization can hold off for procedure. 9/19 colonoscopy showed single ulcer in the proximal ascending colon for 6 to 7 mm polyps in sigmoid colon, descending colon and rectum removed with cold snare moderate diverticulosis in the entire colon, internal hemorrhoids> recommended for repeat colonoscopy for surveillance based on pathology and okay to resume Coumadin 9/20, okay to resume previous diet follow-up pathology result and okay for discharge from surgical standpoint on 9/20.  Patient is tolerating diet denies any rectal bleeding in the stool. He feels comfortable going home today

## 2023-07-05 NOTE — Progress Notes (Signed)
Pharmacy - Warfarin  Assessment:    Please see note from Corrie Dandy , PharmD earlier today for full details.  Briefly, 52 y.o. male on warfarin PTA for mechanical aortic valve  Warfarin on hold for diverticular bleeding Pharmacy consulted to start IV heparin when INR < 2.5 PM INR recheck still therapeutic at 2.6 despite holding warfarin x 2 days  Plan:  Continue to hold warfarin F/u AM INR and begin IV heparin if appropriate  Bernadene Person, PharmD, BCPS [phone] 07/05/2023, 7:52 PM

## 2023-07-05 NOTE — Progress Notes (Signed)
PROGRESS NOTE    Timothy Browning  ZOX:[id number] DOB: [birth year] DOA: 07/04/2023 PCP: Marrianne Mood, MD   Brief Narrative: Timothy Browning is a 52 y.o. male with a history of hypertension, CKD stage IIIa, paroxysmal atrial fibrillation, aortic aneurysm status post Bentall procedure and status post aortic valve replacement with mechanical valve on Coumadin.  Patient presented secondary to rectal bleeding and lightheadedness and was found to have evidence of hematochezia with hypotension with concern for lower GI bleeding.  CT angio GI bleed protocol was performed and was significant for an acute bleed located in the ascending colon secondary to diverticular bleeding.  IR was consulted and performed emergent angiogram with successful localization and embolization of ascending colonic diverticula.  GI consulted.   Assessment and Plan:  Acute diverticular bleeding Significant bleeding noted on admission. CT angio GI bleed protocol performed and was significant for identification of acute bleed within the midportion of the ascending colon likely originating from a posterior diverticula. IR was consulted and performed emergent angiogram with successful localization and embolization of ascending colonic diverticular bleed. -GI consult -Monitor for signs of colonic ischemia  Acute blood loss anemia Secondary to diverticular bleeding. Hemoglobin of 13.3 d/dL on admission. Patient was transfused a total of 1 unit of PRBC secondary to hematochezia with associated hypotension. Hemoglobin of 10.3 g/dL today. -CBC daily  Hypotension Likely related to acute blood/fluid losses. Patient was given fluid and blood for resuscitation. -Orthostatic vitals  Paroxysmal atrial fibrillation Currently in sinus rhythm. Patient is on Toprol-XL and coumadin. Toprol XL held secondary to hypotension. -Resume home Toprol XL once blood pressure remains stable.  S/p mechanical aortic valve Valve recently placed in  04/2023. Patient is on coumadin with goal INR of 2.5 to 3.5. Coumadin held secondary to GI bleeding. -Heparin IV once INR reaches below 2.5 -Resume coumadin once ongoing GI bleeding unlikely  CKD stage IIIa Stable.  OSA -Continue CPAP qhs  DVT prophylaxis: Heparin IV once INR less than 2.5 Code Status:   Code Status: Full Code Family Communication: Wife at bedside Disposition Plan: Discharge home likely in 2-3 days pending stable hemoglobin and blood pressure, in addition to stable hemoglobin,  transition to Coumadin and GI recommendations   Consultants:  Gastroenterology ([place])  Procedures:  9/16: Successful localization and embolization of ascending colonic diverticular bleed  Antimicrobials: None    Subjective: No bleeding since late last night. Hungry. Worried about lightheadedness episodes.  Objective: BP 128/77   Pulse 99   Temp 99.3 F (37.4 C) (Oral)   Resp 20   Ht 6' (1.829 m)   Wt 105.9 kg   SpO2 99%   BMI 31.66 kg/m   Examination:  General exam: Appears calm and comfortable Respiratory system: Clear to auscultation. Respiratory effort normal. Cardiovascular system: S1 & S2 heard, RRR. Mechanical click Gastrointestinal system: Abdomen is nondistended, soft and nontender. Normal bowel sounds heard. Central nervous system: Alert and oriented. No focal neurological deficits. Musculoskeletal: No edema. No calf tenderness Skin: No cyanosis. No rashes Psychiatry: Judgement and insight appear normal. Mood & affect appropriate.    Data Reviewed: I have personally reviewed following labs and imaging studies  CBC Lab Results  Component Value Date   WBC 12.3 (H) 07/05/2023   RBC 3.43 (L) 07/05/2023   HGB 10.4 (L) 07/05/2023   HCT 31.6 (L) 07/05/2023   MCV 92.1 07/05/2023   MCH 30.3 07/05/2023   PLT 224 07/05/2023   MCHC 32.9 07/05/2023   RDW 14.7 07/05/2023  LYMPHSABS 1.4 03/21/2023   MONOABS 0.7 03/21/2023   EOSABS 0.0 03/21/2023   BASOSABS  0.0 03/21/2023     Last metabolic panel Lab Results  Component Value Date   NA 133 (L) 07/05/2023   K 4.0 07/05/2023   CL 105 07/05/2023   CO2 22 07/05/2023   BUN 20 07/05/2023   CREATININE 1.48 (H) 07/05/2023   GLUCOSE 113 (H) 07/05/2023   GFRNONAA 57 (L) 07/05/2023   GFRAA 68 02/06/2020   CALCIUM 7.8 (L) 07/05/2023   PHOS 2.2 (L) 05/11/2023   PROT 6.6 07/04/2023   ALBUMIN 3.6 07/04/2023   LABGLOB 3.0 05/28/2023   AGRATIO 1.5 02/06/2020   BILITOT 0.7 07/04/2023   ALKPHOS 56 07/04/2023   AST 17 07/04/2023   ALT 24 07/04/2023   ANIONGAP 6 07/05/2023    GFR: Estimated Creatinine Clearance: 74.3 mL/min (A) (by C-G formula based on SCr of 1.48 mg/dL (H)).  Recent Results (from the past 240 hour(s))  MRSA Next Gen by PCR, Nasal     Status: None   Collection Time: 07/04/23  4:46 PM   Specimen: Nasal Mucosa; Nasal Swab  Result Value Ref Range Status   MRSA by PCR Next Gen NOT DETECTED NOT DETECTED Final    Comment: (NOTE) The GeneXpert MRSA Assay (FDA approved for NASAL specimens only), is one component of a comprehensive MRSA colonization surveillance program. It is not intended to diagnose MRSA infection nor to guide or monitor treatment for MRSA infections. Test performance is not FDA approved in patients less than 98 years old. Performed at [facility], [address], [place], [place] [address]       Radiology Studies: IR EMBO ART  VEN HEMORR LYMPH EXTRAV  INC GUIDE ROADMAPPING  Result Date: 07/04/2023 INDICATION: 52 year old male on Coumadin with for a prior aortic valve replacement. 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 EXAM: IR EMBO ART VEN HEMORR LYMPH EXTRAV INC GUIDE ROADMAPPING; ADDITIONAL ARTERIOGRAPHY MEDICATIONS: None. ANESTHESIA/SEDATION: Moderate (conscious) sedation was employed during this procedure. A total of Versed 2 mg and Fentanyl 50 mcg was administered  intravenously by radiology nursing. Moderate Sedation Time: 70 minutes. The patient's level of consciousness and vital signs were monitored continuously by radiology nursing throughout the procedure under my direct supervision. CONTRAST:  100 mL Omnipaque 300 FLUOROSCOPY: Radiation Exposure Index (as provided by the fluoroscopic device): 2,922 mGy Kerma COMPLICATIONS: None immediate. PROCEDURE: Informed consent was obtained from the patient following explanation of the procedure, risks, benefits and alternatives. The patient understands, agrees and consents for the procedure. All questions were addressed. A time out was performed prior to the initiation of the procedure. Maximal barrier sterile technique utilized including caps, mask, sterile gowns, sterile gloves, large sterile drape, hand hygiene, and Betadine prep. The right common femoral artery was interrogated with ultrasound and found to be widely patent. An image was obtained and stored for the medical record. Local anesthesia was attained by infiltration with 1% lidocaine. A small dermatotomy was made. Under real-time sonographic guidance, the vessel was punctured with a 21 gauge micropuncture needle. Using standard technique, the initial micro needle was exchanged over a 0.018 micro wire for a transitional 4 [place] micro sheath. The micro sheath was then exchanged over a 0.035 wire for a 5 French vascular sheath. A C2 cobra catheter was advanced over a Bentson wire and used to select the celiac artery. A celiac arteriogram was performed. No evidence of active bleeding.  No evidence of replaced colic artery. The C2 cobra catheter was next advanced into the superior mesenteric artery. A arteriogram was performed. There is evidence of active bleeding arising from the terminal branch in the ascending colon as expected from the prior CTA. A Cook can tot a microcatheter was advanced into the ileocolic. An arteriogram was performed. The active bleeding branch does  not appear to arise from the distal ileo colic artery. The microcatheter was next advanced into the right colic artery. Arteriography was performed. The hemorrhage is again visualized. Have appears to arise at the anti mesenteric border of the colon interval region of potentially several feeding distal branch arteries. The microcatheter was advanced into the first potential arterial feeder. Contrast injection confirms active hemorrhage. Coil embolization was performed utilizing a series of 1 and 2 mm low-profile penumbra detachable coils. The microcatheter was brought back. Contrast injection was performed. The embolized vessel is successfully embolized. However, additional bleeding is occurring from a more cephalad branch. The microcatheter was successfully navigated into this branch. Contrast injection was performed confirming active bleeding. Coil embolization was then performed. During the coil embolization there was some extravasation of contrast likely from a coil protruding through the very small arterial wall. Additional coil embolization was performed with successful cessation of hemorrhage. Follow-up contrast injection again performed there is a third tiny distal branch artery still contributing 2 bleeding at the diverticular site. The microcatheter was advanced into this third artery and additional coil embolization was performed. As before, a combination of low-profile penumbra detachable coils in 2 and 3 mm diameters were deployed. Final arteriography demonstrates cessation bleeding. The catheter system was removed. Hemostasis was attained with the assistance of a Celt arterial closure device. IMPRESSION: Successful embolization of distal branches of the right colic artery which were providing blood flow to the hemorrhaging ascending colonic diverticulum. No evidence of continued bleeding at the end of the procedure. Electronically Signed   By: Malachy Moan M.D.   On: 07/04/2023 15:34   IR  Angiogram Selective Each Additional Vessel  Result Date: 07/04/2023 INDICATION: 52 year old male on Coumadin with for a prior aortic valve replacement. 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 EXAM: IR EMBO ART VEN HEMORR LYMPH EXTRAV INC GUIDE ROADMAPPING; ADDITIONAL ARTERIOGRAPHY MEDICATIONS: None. ANESTHESIA/SEDATION: Moderate (conscious) sedation was employed during this procedure. A total of Versed 2 mg and Fentanyl 50 mcg was administered intravenously by radiology nursing. Moderate Sedation Time: 70 minutes. The patient's level of consciousness and vital signs were monitored continuously by radiology nursing throughout the procedure under my direct supervision. CONTRAST:  100 mL Omnipaque 300 FLUOROSCOPY: Radiation Exposure Index (as provided by the fluoroscopic device): 2,922 mGy Kerma COMPLICATIONS: None immediate. PROCEDURE: Informed consent was obtained from the patient following explanation of the procedure, risks, benefits and alternatives. The patient understands, agrees and consents for the procedure. All questions were addressed. A time out was performed prior to the initiation of the procedure. Maximal barrier sterile technique utilized including caps, mask, sterile gowns, sterile gloves, large sterile drape, hand hygiene, and Betadine prep. The right common femoral artery was interrogated with ultrasound and found to be widely patent. An image was obtained and stored for the medical record. Local anesthesia was attained by infiltration with 1% lidocaine. A small dermatotomy was made. Under real-time sonographic guidance, the vessel was punctured with a 21 gauge micropuncture needle. Using standard technique, the initial micro needle was exchanged over a 0.018 micro wire for  a transitional 4 [place] micro sheath. The micro sheath was then exchanged over a 0.035 wire for a 5 French vascular sheath. A C2 cobra catheter was advanced over a  Bentson wire and used to select the celiac artery. A celiac arteriogram was performed. No evidence of active bleeding. No evidence of replaced colic artery. The C2 cobra catheter was next advanced into the superior mesenteric artery. A arteriogram was performed. There is evidence of active bleeding arising from the terminal branch in the ascending colon as expected from the prior CTA. A Cook can tot a microcatheter was advanced into the ileocolic. An arteriogram was performed. The active bleeding branch does not appear to arise from the distal ileo colic artery. The microcatheter was next advanced into the right colic artery. Arteriography was performed. The hemorrhage is again visualized. Have appears to arise at the anti mesenteric border of the colon interval region of potentially several feeding distal branch arteries. The microcatheter was advanced into the first potential arterial feeder. Contrast injection confirms active hemorrhage. Coil embolization was performed utilizing a series of 1 and 2 mm low-profile penumbra detachable coils. The microcatheter was brought back. Contrast injection was performed. The embolized vessel is successfully embolized. However, additional bleeding is occurring from a more cephalad branch. The microcatheter was successfully navigated into this branch. Contrast injection was performed confirming active bleeding. Coil embolization was then performed. During the coil embolization there was some extravasation of contrast likely from a coil protruding through the very small arterial wall. Additional coil embolization was performed with successful cessation of hemorrhage. Follow-up contrast injection again performed there is a third tiny distal branch artery still contributing 2 bleeding at the diverticular site. The microcatheter was advanced into this third artery and additional coil embolization was performed. As before, a combination of low-profile penumbra detachable coils in 2  and 3 mm diameters were deployed. Final arteriography demonstrates cessation bleeding. The catheter system was removed. Hemostasis was attained with the assistance of a Celt arterial closure device. IMPRESSION: Successful embolization of distal branches of the right colic artery which were providing blood flow to the hemorrhaging ascending colonic diverticulum. No evidence of continued bleeding at the end of the procedure. Electronically Signed   By: Malachy Moan M.D.   On: 07/04/2023 15:34   IR Angiogram Selective Each Additional Vessel  Result Date: 07/04/2023 INDICATION: 52 year old male on Coumadin with for a prior aortic valve replacement. 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 EXAM: IR EMBO ART VEN HEMORR LYMPH EXTRAV INC GUIDE ROADMAPPING; ADDITIONAL ARTERIOGRAPHY MEDICATIONS: None. ANESTHESIA/SEDATION: Moderate (conscious) sedation was employed during this procedure. A total of Versed 2 mg and Fentanyl 50 mcg was administered intravenously by radiology nursing. Moderate Sedation Time: 70 minutes. The patient's level of consciousness and vital signs were monitored continuously by radiology nursing throughout the procedure under my direct supervision. CONTRAST:  100 mL Omnipaque 300 FLUOROSCOPY: Radiation Exposure Index (as provided by the fluoroscopic device): 2,922 mGy Kerma COMPLICATIONS: None immediate. PROCEDURE: Informed consent was obtained from the patient following explanation of the procedure, risks, benefits and alternatives. The patient understands, agrees and consents for the procedure. All questions were addressed. A time out was performed prior to the initiation of the procedure. Maximal barrier sterile technique utilized including caps, mask, sterile gowns, sterile gloves, large sterile drape, hand hygiene, and Betadine prep. The right common femoral artery was interrogated with ultrasound and found to be widely patent.  An image was  obtained and stored for the medical record. Local anesthesia was attained by infiltration with 1% lidocaine. A small dermatotomy was made. Under real-time sonographic guidance, the vessel was punctured with a 21 gauge micropuncture needle. Using standard technique, the initial micro needle was exchanged over a 0.018 micro wire for a transitional 4 [place] micro sheath. The micro sheath was then exchanged over a 0.035 wire for a 5 French vascular sheath. A C2 cobra catheter was advanced over a Bentson wire and used to select the celiac artery. A celiac arteriogram was performed. No evidence of active bleeding. No evidence of replaced colic artery. The C2 cobra catheter was next advanced into the superior mesenteric artery. A arteriogram was performed. There is evidence of active bleeding arising from the terminal branch in the ascending colon as expected from the prior CTA. A Cook can tot a microcatheter was advanced into the ileocolic. An arteriogram was performed. The active bleeding branch does not appear to arise from the distal ileo colic artery. The microcatheter was next advanced into the right colic artery. Arteriography was performed. The hemorrhage is again visualized. Have appears to arise at the anti mesenteric border of the colon interval region of potentially several feeding distal branch arteries. The microcatheter was advanced into the first potential arterial feeder. Contrast injection confirms active hemorrhage. Coil embolization was performed utilizing a series of 1 and 2 mm low-profile penumbra detachable coils. The microcatheter was brought back. Contrast injection was performed. The embolized vessel is successfully embolized. However, additional bleeding is occurring from a more cephalad branch. The microcatheter was successfully navigated into this branch. Contrast injection was performed confirming active bleeding. Coil embolization was then performed. During the coil embolization  there was some extravasation of contrast likely from a coil protruding through the very small arterial wall. Additional coil embolization was performed with successful cessation of hemorrhage. Follow-up contrast injection again performed there is a third tiny distal branch artery still contributing 2 bleeding at the diverticular site. The microcatheter was advanced into this third artery and additional coil embolization was performed. As before, a combination of low-profile penumbra detachable coils in 2 and 3 mm diameters were deployed. Final arteriography demonstrates cessation bleeding. The catheter system was removed. Hemostasis was attained with the assistance of a Celt arterial closure device. IMPRESSION: Successful embolization of distal branches of the right colic artery which were providing blood flow to the hemorrhaging ascending colonic diverticulum. No evidence of continued bleeding at the end of the procedure. Electronically Signed   By: Malachy Moan M.D.   On: 07/04/2023 15:34   IR Angiogram Selective Each Additional Vessel  Result Date: 07/04/2023 INDICATION: 52 year old male on Coumadin with for a prior aortic valve replacement. 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 EXAM: IR EMBO ART VEN HEMORR LYMPH EXTRAV INC GUIDE ROADMAPPING; ADDITIONAL ARTERIOGRAPHY MEDICATIONS: None. ANESTHESIA/SEDATION: Moderate (conscious) sedation was employed during this procedure. A total of Versed 2 mg and Fentanyl 50 mcg was administered intravenously by radiology nursing. Moderate Sedation Time: 70 minutes. The patient's level of consciousness and vital signs were monitored continuously by radiology nursing throughout the procedure under my direct supervision. CONTRAST:  100 mL Omnipaque 300 FLUOROSCOPY: Radiation Exposure Index (as provided by the fluoroscopic device): 2,922 mGy Kerma COMPLICATIONS: None immediate. PROCEDURE: Informed consent  was obtained from the patient following explanation of the procedure, risks, benefits and alternatives. The patient understands, agrees and consents for the procedure. All questions were addressed. A  time out was performed prior to the initiation of the procedure. Maximal barrier sterile technique utilized including caps, mask, sterile gowns, sterile gloves, large sterile drape, hand hygiene, and Betadine prep. The right common femoral artery was interrogated with ultrasound and found to be widely patent. An image was obtained and stored for the medical record. Local anesthesia was attained by infiltration with 1% lidocaine. A small dermatotomy was made. Under real-time sonographic guidance, the vessel was punctured with a 21 gauge micropuncture needle. Using standard technique, the initial micro needle was exchanged over a 0.018 micro wire for a transitional 4 [place] micro sheath. The micro sheath was then exchanged over a 0.035 wire for a 5 French vascular sheath. A C2 cobra catheter was advanced over a Bentson wire and used to select the celiac artery. A celiac arteriogram was performed. No evidence of active bleeding. No evidence of replaced colic artery. The C2 cobra catheter was next advanced into the superior mesenteric artery. A arteriogram was performed. There is evidence of active bleeding arising from the terminal branch in the ascending colon as expected from the prior CTA. A Cook can tot a microcatheter was advanced into the ileocolic. An arteriogram was performed. The active bleeding branch does not appear to arise from the distal ileo colic artery. The microcatheter was next advanced into the right colic artery. Arteriography was performed. The hemorrhage is again visualized. Have appears to arise at the anti mesenteric border of the colon interval region of potentially several feeding distal branch arteries. The microcatheter was advanced into the first potential arterial feeder. Contrast injection  confirms active hemorrhage. Coil embolization was performed utilizing a series of 1 and 2 mm low-profile penumbra detachable coils. The microcatheter was brought back. Contrast injection was performed. The embolized vessel is successfully embolized. However, additional bleeding is occurring from a more cephalad branch. The microcatheter was successfully navigated into this branch. Contrast injection was performed confirming active bleeding. Coil embolization was then performed. During the coil embolization there was some extravasation of contrast likely from a coil protruding through the very small arterial wall. Additional coil embolization was performed with successful cessation of hemorrhage. Follow-up contrast injection again performed there is a third tiny distal branch artery still contributing 2 bleeding at the diverticular site. The microcatheter was advanced into this third artery and additional coil embolization was performed. As before, a combination of low-profile penumbra detachable coils in 2 and 3 mm diameters were deployed. Final arteriography demonstrates cessation bleeding. The catheter system was removed. Hemostasis was attained with the assistance of a Celt arterial closure device. IMPRESSION: Successful embolization of distal branches of the right colic artery which were providing blood flow to the hemorrhaging ascending colonic diverticulum. No evidence of continued bleeding at the end of the procedure. Electronically Signed   By: Malachy Moan M.D.   On: 07/04/2023 15:34   CT ANGIO GI BLEED  Result Date: 07/04/2023 CLINICAL DATA:  Bloody stools since this morning. Patient on Eliquis. Abdominal aortic aneurysm, postop. EXAM: CTA ABDOMEN AND PELVIS WITHOUT AND WITH CONTRAST TECHNIQUE: Multidetector CT imaging of the abdomen and pelvis was performed using the standard protocol during bolus administration of intravenous contrast. Multiplanar reconstructed images and MIPs were obtained and  reviewed to evaluate the vascular anatomy. RADIATION DOSE REDUCTION: This exam was performed according to the departmental dose-optimization program which includes automated exposure control, adjustment of the mA and/or kV according to patient size and/or use of iterative reconstruction technique. CONTRAST:  OMNIPAQUE IOHEXOL 350 MG/ML  SOLN COMPARISON:  03/21/2023 FINDINGS: VASCULAR Aorta: Normal caliber aorta without aneurysm, dissection, vasculitis or significant stenosis. Celiac: Patent without evidence of aneurysm, dissection, vasculitis or significant stenosis. SMA: Patent without evidence of aneurysm, dissection, vasculitis or significant stenosis. Renals: Both renal arteries are patent without evidence of aneurysm, dissection, vasculitis, fibromuscular dysplasia or significant stenosis. IMA: Patent without evidence of aneurysm, dissection, vasculitis or significant stenosis. Inflow: Patent without evidence of aneurysm, dissection, vasculitis or significant stenosis. Proximal Outflow: Bilateral common femoral and visualized portions of the superficial and profunda femoral arteries are patent without evidence of aneurysm, dissection, vasculitis or significant stenosis. Veins: No obvious venous abnormality within the limitations of this arterial phase study. Review of the MIP images confirms the above findings. NON-VASCULAR Lower chest: Minimal linear atelectasis/scarring in the lung bases. Median sternotomy wires are present. Partially visualized hardware over the aortic root. Hepatobiliary: Previous cholecystectomy. Subcentimeter cyst over the dome of the liver. Biliary tree is normal. Pancreas: Normal. Spleen: Previous splenectomy. Several small splenic remnants over the left lateral abdomen unchanged. Adrenals/Urinary Tract: Adrenal glands are normal. Kidneys are normal in size without hydronephrosis or nephrolithiasis. Ureters and bladder are unremarkable. Stomach/Bowel: Surgical clips over the  proximal stomach. Small bowel is within normal. Appendix is normal. There is significant diverticulosis over the ascending colon as well as the distal descending and sigmoid colon. No definite diverticulitis. The arterial phase images demonstrate hyperdense material within the midportion of the ascending colon with mild pooling within the dependent portion of the ascending colon as this appears to be originating from a posterior diverticula compatible with acute bleed. The venous phase images again demonstrate this hyperdense material which is more dense and concentrated along the anterior aspect of the ascending colon at the base of several additional diverticula. No evidence of perforation/free peritoneal air. Lymphatic: No adenopathy. Reproductive: Normal. Other: Postsurgical change of the anterior abdominal wall likely from previous hernia repair. Musculoskeletal: Old pelvic fractures. Mild degenerative change of the hips and spine. IMPRESSION: 1. Acute bleed within the midportion of the ascending colon likely originating from a posterior diverticula, although more concentrated adjacent the base of an anterior diverticula in this same region on the delayed images. No evidence of perforation/free peritoneal air. 2. Significant diverticulosis over the ascending colon as well as the distal descending and sigmoid colon. No acute diverticulitis. 3. Previous cholecystectomy and splenectomy. Subcentimeter hepatic cyst. Critical Value/emergent results were called by telephone at the time of interpretation on 07/04/2023 at 1:07 pm to provider Lauren Roemildt, who verbally acknowledged these results. Electronically Signed   By: Elberta Fortis M.D.   On: 07/04/2023 13:09      LOS: 1 day    Jacquelin Hawking, MD [facility] 07/05/2023, 9:25 AM   If 7PM-7AM, please contact night-coverage [email or web address]

## 2023-07-05 NOTE — Plan of Care (Signed)

## 2023-07-05 NOTE — Consult Note (Incomplete)
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 punctured with a 21 gauge micropuncture needle. Using standard technique, the initial micro needle was exchanged over a 0.018 micro wire for a  transitional 4 French micro sheath. The micro sheath was then exchanged over a 0.035 wire for a 5 French vascular sheath. A C2 cobra catheter was advanced over a Bentson wire and used to select the celiac artery. A celiac arteriogram was performed. No evidence of active bleeding. No evidence of replaced colic artery. The C2 cobra catheter was next advanced into the superior mesenteric artery. A arteriogram was performed. There is evidence of active bleeding arising from the  terminal branch in the ascending colon as expected from the prior CTA. A Cook can tot a microcatheter was advanced into the ileocolic. An arteriogram was performed. The active bleeding branch does not appear to arise from the distal ileo colic artery. The microcatheter was next advanced into the right colic artery. Arteriography was performed. The hemorrhage is again visualized. Have appears to arise at the anti mesenteric border of the colon interval region of potentially several feeding distal branch arteries. The microcatheter was advanced into the first potential arterial feeder. Contrast injection confirms active hemorrhage. Coil embolization was performed utilizing a series of 1 and 2 mm low-profile penumbra detachable coils. The microcatheter was brought back. Contrast injection was performed. The embolized vessel is successfully embolized. However, additional bleeding is occurring from a more cephalad branch. The microcatheter was successfully navigated into this branch. Contrast injection was performed confirming active bleeding. Coil embolization was then performed. During the coil embolization there was some extravasation of contrast likely from a coil protruding through the very small arterial wall. Additional coil embolization was performed with successful cessation of hemorrhage. Follow-up contrast injection again performed there is a third tiny distal branch artery still contributing 2 bleeding at the diverticular site. The microcatheter was advanced into this third artery and additional coil embolization was performed. As before, a combination of low-profile penumbra detachable coils in 2 and 3 mm diameters were deployed. Final arteriography demonstrates cessation bleeding. The catheter system was removed. Hemostasis was attained with the assistance of a Celt arterial closure device. IMPRESSION: Successful embolization of distal branches of the right colic artery which were providing blood flow to  the hemorrhaging ascending colonic diverticulum. No evidence of continued bleeding at the end of the procedure. Electronically Signed   By: Malachy Moan M.D.   On: 07/05/2023 12:26   IR EMBO ART  VEN HEMORR LYMPH EXTRAV  INC GUIDE ROADMAPPING  Result Date: 07/04/2023 INDICATION: 52 year old male on Coumadin with for a prior aortic valve replacement. 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 EXAM: IR EMBO ART VEN HEMORR LYMPH EXTRAV INC GUIDE ROADMAPPING; ADDITIONAL ARTERIOGRAPHY MEDICATIONS: None. ANESTHESIA/SEDATION: Moderate (conscious) sedation was employed during this procedure. A total of Versed 2 mg and Fentanyl 50 mcg was administered intravenously by radiology nursing. Moderate Sedation Time: 70 minutes. The patient's level of consciousness and vital signs were monitored continuously by radiology nursing throughout the procedure under my direct supervision. CONTRAST:  100 mL Omnipaque 300 FLUOROSCOPY: Radiation Exposure Index (as provided by the fluoroscopic device): 2,922 mGy Kerma COMPLICATIONS: None immediate. PROCEDURE: Informed consent was obtained from the patient following explanation of the procedure, risks, benefits and alternatives. The patient understands, agrees and consents for the procedure. All questions were addressed. A time out was performed prior to the initiation of the procedure. Maximal barrier sterile technique utilized including caps, mask, sterile gowns, sterile gloves, large sterile drape, hand hygiene, and Betadine prep. The right common femoral artery was interrogated with ultrasound and found to be widely  patent. An image was obtained and stored for the medical record. Local anesthesia was attained by infiltration with 1% lidocaine. A small dermatotomy was made. Under real-time sonographic guidance, the vessel was punctured with a 21 gauge micropuncture needle. Using standard technique, the initial micro  needle was exchanged over a 0.018 micro wire for a transitional 4 [place] micro sheath. The micro sheath was then exchanged over a 0.035 wire for a 5 French vascular sheath. A C2 cobra catheter was advanced over a Bentson wire and used to select the celiac artery. A celiac arteriogram was performed. No evidence of active bleeding. No evidence of replaced colic artery. The C2 cobra catheter was next advanced into the superior mesenteric artery. A arteriogram was performed. There is evidence of active bleeding arising from the terminal branch in the ascending colon as expected from the prior CTA. A Cook can tot a microcatheter was advanced into the ileocolic. An arteriogram was performed. The active bleeding branch does not appear to arise from the distal ileo colic artery. The microcatheter was next advanced into the right colic artery. Arteriography was performed. The hemorrhage is again visualized. Have appears to arise at the anti mesenteric border of the colon interval region of potentially several feeding distal branch arteries. The microcatheter was advanced into the first potential arterial feeder. Contrast injection confirms active hemorrhage. Coil embolization was performed utilizing a series of 1 and 2 mm low-profile penumbra detachable coils. The microcatheter was brought back. Contrast injection was performed. The embolized vessel is successfully embolized. However, additional bleeding is occurring from a more cephalad branch. The microcatheter was successfully navigated into this branch. Contrast injection was performed confirming active bleeding. Coil embolization was then performed. During the coil embolization there was some extravasation of contrast likely from a coil protruding through the very small arterial wall. Additional coil embolization was performed with successful cessation of hemorrhage. Follow-up contrast injection again performed there is a third tiny distal branch artery still  contributing 2 bleeding at the diverticular site. The microcatheter was advanced into this third artery and additional coil embolization was performed. As before, a combination of low-profile penumbra detachable coils in 2 and 3 mm diameters were deployed. Final arteriography demonstrates cessation bleeding. The catheter system was removed. Hemostasis was attained with the assistance of a Celt arterial closure device. IMPRESSION: Successful embolization of distal branches of the right colic artery which were providing blood flow to the hemorrhaging ascending colonic diverticulum. No evidence of continued bleeding at the end of the procedure. Electronically Signed   By: Malachy Moan M.D.   On: 07/04/2023 15:34   IR Angiogram Selective Each Additional Vessel  Result Date: 07/04/2023 INDICATION: 52 year old male on Coumadin with for a prior aortic valve replacement. 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 EXAM: IR EMBO ART VEN HEMORR LYMPH EXTRAV INC GUIDE ROADMAPPING; ADDITIONAL ARTERIOGRAPHY MEDICATIONS: None. ANESTHESIA/SEDATION: Moderate (conscious) sedation was employed during this procedure. A total of Versed 2 mg and Fentanyl 50 mcg was administered intravenously by radiology nursing. Moderate Sedation Time: 70 minutes. The patient's level of consciousness and vital signs were monitored continuously by radiology nursing throughout the procedure under my direct supervision. CONTRAST:  100 mL Omnipaque 300 FLUOROSCOPY: Radiation Exposure Index (as provided by the fluoroscopic device): 2,922 mGy Kerma COMPLICATIONS: None immediate. PROCEDURE: Informed consent was obtained from the patient following explanation of the procedure, risks, benefits and alternatives. The patient understands, agrees and consents for the procedure. All  questions were addressed. A time out was performed prior to the initiation of the procedure. Maximal barrier sterile  technique utilized including caps, mask, sterile gowns, sterile gloves, large sterile drape, hand hygiene, and Betadine prep. The right common femoral artery was interrogated with ultrasound and found to be widely patent. An image was obtained and stored for the medical record. Local anesthesia was attained by infiltration with 1% lidocaine. A small dermatotomy was made. Under real-time sonographic guidance, the vessel was punctured with a 21 gauge micropuncture needle. Using standard technique, the initial micro needle was exchanged over a 0.018 micro wire for a transitional 4 [place] micro sheath. The micro sheath was then exchanged over a 0.035 wire for a 5 French vascular sheath. A C2 cobra catheter was advanced over a Bentson wire and used to select the celiac artery. A celiac arteriogram was performed. No evidence of active bleeding. No evidence of replaced colic artery. The C2 cobra catheter was next advanced into the superior mesenteric artery. A arteriogram was performed. There is evidence of active bleeding arising from the terminal branch in the ascending colon as expected from the prior CTA. A Cook can tot a microcatheter was advanced into the ileocolic. An arteriogram was performed. The active bleeding branch does not appear to arise from the distal ileo colic artery. The microcatheter was next advanced into the right colic artery. Arteriography was performed. The hemorrhage is again visualized. Have appears to arise at the anti mesenteric border of the colon interval region of potentially several feeding distal branch arteries. The microcatheter was advanced into the first potential arterial feeder. Contrast injection confirms active hemorrhage. Coil embolization was performed utilizing a series of 1 and 2 mm low-profile penumbra detachable coils. The microcatheter was brought back. Contrast injection was performed. The embolized vessel is successfully embolized. However, additional bleeding is  occurring from a more cephalad branch. The microcatheter was successfully navigated into this branch. Contrast injection was performed confirming active bleeding. Coil embolization was then performed. During the coil embolization there was some extravasation of contrast likely from a coil protruding through the very small arterial wall. Additional coil embolization was performed with successful cessation of hemorrhage. Follow-up contrast injection again performed there is a third tiny distal branch artery still contributing 2 bleeding at the diverticular site. The microcatheter was advanced into this third artery and additional coil embolization was performed. As before, a combination of low-profile penumbra detachable coils in 2 and 3 mm diameters were deployed. Final arteriography demonstrates cessation bleeding. The catheter system was removed. Hemostasis was attained with the assistance of a Celt arterial closure device. IMPRESSION: Successful embolization of distal branches of the right colic artery which were providing blood flow to the hemorrhaging ascending colonic diverticulum. No evidence of continued bleeding at the end of the procedure. Electronically Signed   By: Malachy Moan M.D.   On: 07/04/2023 15:34   IR Angiogram Selective Each Additional Vessel  Result Date: 07/04/2023 INDICATION: 52 year old male on Coumadin with for a prior aortic valve replacement. 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 EXAM: IR EMBO ART VEN HEMORR LYMPH EXTRAV INC GUIDE ROADMAPPING; ADDITIONAL ARTERIOGRAPHY MEDICATIONS: None. ANESTHESIA/SEDATION: Moderate (conscious) sedation was employed during this procedure. A total of Versed 2 mg and Fentanyl 50 mcg was administered intravenously by radiology nursing. Moderate Sedation Time: 70 minutes. The patient's level of consciousness and vital signs were monitored continuously by radiology nursing throughout  the procedure under my direct  supervision. CONTRAST:  100 mL Omnipaque 300 FLUOROSCOPY: Radiation Exposure Index (as provided by the fluoroscopic device): 2,922 mGy Kerma COMPLICATIONS: None immediate. PROCEDURE: Informed consent was obtained from the patient following explanation of the procedure, risks, benefits and alternatives. The patient understands, agrees and consents for the procedure. All questions were addressed. A time out was performed prior to the initiation of the procedure. Maximal barrier sterile technique utilized including caps, mask, sterile gowns, sterile gloves, large sterile drape, hand hygiene, and Betadine prep. The right common femoral artery was interrogated with ultrasound and found to be widely patent. An image was obtained and stored for the medical record. Local anesthesia was attained by infiltration with 1% lidocaine. A small dermatotomy was made. Under real-time sonographic guidance, the vessel was punctured with a 21 gauge micropuncture needle. Using standard technique, the initial micro needle was exchanged over a 0.018 micro wire for a transitional 4 [place] micro sheath. The micro sheath was then exchanged over a 0.035 wire for a 5 French vascular sheath. A C2 cobra catheter was advanced over a Bentson wire and used to select the celiac artery. A celiac arteriogram was performed. No evidence of active bleeding. No evidence of replaced colic artery. The C2 cobra catheter was next advanced into the superior mesenteric artery. A arteriogram was performed. There is evidence of active bleeding arising from the terminal branch in the ascending colon as expected from the prior CTA. A Cook can tot a microcatheter was advanced into the ileocolic. An arteriogram was performed. The active bleeding branch does not appear to arise from the distal ileo colic artery. The microcatheter was next advanced into the right colic artery. Arteriography was performed. The hemorrhage is again visualized.  Have appears to arise at the anti mesenteric border of the colon interval region of potentially several feeding distal branch arteries. The microcatheter was advanced into the first potential arterial feeder. Contrast injection confirms active hemorrhage. Coil embolization was performed utilizing a series of 1 and 2 mm low-profile penumbra detachable coils. The microcatheter was brought back. Contrast injection was performed. The embolized vessel is successfully embolized. However, additional bleeding is occurring from a more cephalad branch. The microcatheter was successfully navigated into this branch. Contrast injection was performed confirming active bleeding. Coil embolization was then performed. During the coil embolization there was some extravasation of contrast likely from a coil protruding through the very small arterial wall. Additional coil embolization was performed with successful cessation of hemorrhage. Follow-up contrast injection again performed there is a third tiny distal branch artery still contributing 2 bleeding at the diverticular site. The microcatheter was advanced into this third artery and additional coil embolization was performed. As before, a combination of low-profile penumbra detachable coils in 2 and 3 mm diameters were deployed. Final arteriography demonstrates cessation bleeding. The catheter system was removed. Hemostasis was attained with the assistance of a Celt arterial closure device. IMPRESSION: Successful embolization of distal branches of the right colic artery which were providing blood flow to the hemorrhaging ascending colonic diverticulum. No evidence of continued bleeding at the end of the procedure. Electronically Signed   By: Malachy Moan M.D.   On: 07/04/2023 15:34   IR Angiogram Selective Each Additional Vessel  Result Date: 07/04/2023 INDICATION: 52 year old male on Coumadin with for a prior aortic valve replacement. He has active bleeding arising from a  diverticulum in the ascending colon with evidence of hypotension. EXAM: IR EMBO ART VEN HEMORR LYMPH EXTRAV INC GUIDE ROADMAPPING; ADDITIONAL ARTERIOGRAPHY MEDICATIONS: None. ANESTHESIA/SEDATION:  Moderate (conscious) sedation was employed during this procedure. A total of Versed 2 mg and Fentanyl 50 mcg was administered intravenously by radiology nursing. Moderate Sedation Time: 70 minutes. The patient's level of consciousness and vital signs were monitored continuously by radiology nursing throughout the procedure under my direct supervision. CONTRAST:  100 mL Omnipaque 300 FLUOROSCOPY: Radiation Exposure Index (as provided by the fluoroscopic device): 2,922 mGy Kerma COMPLICATIONS: None immediate. PROCEDURE: Informed consent was obtained from the patient following explanation of the procedure, risks, benefits and alternatives. The patient understands, agrees and consents for the procedure. All questions were addressed. A time out was performed prior to the initiation of the procedure. Maximal barrier sterile technique utilized including caps, mask, sterile gowns, sterile gloves, large sterile drape, hand hygiene, and Betadine prep. The right common femoral artery was interrogated with ultrasound and found to be widely patent. An image was obtained and stored for the medical record. Local anesthesia was attained by infiltration with 1% lidocaine. A small dermatotomy was made. Under real-time sonographic guidance, the vessel was punctured with a 21 gauge micropuncture needle. Using standard technique, the initial micro needle was exchanged over a 0.018 micro wire for a transitional 4 [place] micro sheath. The micro sheath was then exchanged over a 0.035 wire for a 5 French vascular sheath. A C2 cobra catheter was advanced over a Bentson wire and used to select the celiac artery. A celiac arteriogram was performed. No evidence of active bleeding. No evidence of replaced colic artery. The C2 cobra catheter was next  advanced into the superior mesenteric artery. A arteriogram was performed. There is evidence of active bleeding arising from the terminal branch in the ascending colon as expected from the prior CTA. A Cook can tot a microcatheter was advanced into the ileocolic. An arteriogram was performed. The active bleeding branch does not appear to arise from the distal ileo colic artery. The microcatheter was next advanced into the right colic artery. Arteriography was performed. The hemorrhage is again visualized. Have appears to arise at the anti mesenteric border of the colon interval region of potentially several feeding distal branch arteries. The microcatheter was advanced into the first potential arterial feeder. Contrast injection confirms active hemorrhage. Coil embolization was performed utilizing a series of 1 and 2 mm low-profile penumbra detachable coils. The microcatheter was brought back. Contrast injection was performed. The embolized vessel is successfully embolized. However, additional bleeding is occurring from a more cephalad branch. The microcatheter was successfully navigated into this branch. Contrast injection was performed confirming active bleeding. Coil embolization was then performed. During the coil embolization there was some extravasation of contrast likely from a coil protruding through the very small arterial wall. Additional coil embolization was performed with successful cessation of hemorrhage. Follow-up contrast injection again performed there is a third tiny distal branch artery still contributing 2 bleeding at the diverticular site. The microcatheter was advanced into this third artery and additional coil embolization was performed. As before, a combination of low-profile penumbra detachable coils in 2 and 3 mm diameters were deployed. Final arteriography demonstrates cessation bleeding. The catheter system was removed. Hemostasis was attained with the assistance of a Celt arterial  closure device. IMPRESSION: Successful embolization of distal branches of the right colic artery which were providing blood flow to the hemorrhaging ascending colonic diverticulum. No evidence of continued bleeding at the end of the procedure. Electronically Signed   By: Malachy Moan M.D.   On: 07/04/2023 15:34   CT ANGIO GI BLEED  Result Date: 07/04/2023 CLINICAL DATA:  Bloody stools since this morning. Patient on Eliquis. Abdominal aortic aneurysm, postop. EXAM: CTA ABDOMEN AND PELVIS WITHOUT AND WITH CONTRAST TECHNIQUE: Multidetector CT imaging of the abdomen and pelvis was performed using the standard protocol during bolus administration of intravenous contrast. Multiplanar reconstructed images and MIPs were obtained and reviewed to evaluate the vascular anatomy. RADIATION DOSE REDUCTION: This exam was performed according to the departmental dose-optimization program which includes automated exposure control, adjustment of the mA and/or kV according to patient size and/or use of iterative reconstruction technique. CONTRAST:  OMNIPAQUE IOHEXOL 350 MG/ML SOLN COMPARISON:  03/21/2023 FINDINGS: VASCULAR Aorta: Normal caliber aorta without aneurysm, dissection, vasculitis or significant stenosis. Celiac: Patent without evidence of aneurysm, dissection, vasculitis or significant stenosis. SMA: Patent without evidence of aneurysm, dissection, vasculitis or significant stenosis. Renals: Both renal arteries are patent without evidence of aneurysm, dissection, vasculitis, fibromuscular dysplasia or significant stenosis. IMA: Patent without evidence of aneurysm, dissection, vasculitis or significant stenosis. Inflow: Patent without evidence of aneurysm, dissection, vasculitis or significant stenosis. Proximal Outflow: Bilateral common femoral and visualized portions of the superficial and profunda femoral arteries are patent without evidence of aneurysm, dissection, vasculitis or significant stenosis. Veins:  No obvious venous abnormality within the limitations of this arterial phase study. Review of the MIP images confirms the above findings. NON-VASCULAR Lower chest: Minimal linear atelectasis/scarring in the lung bases. Median sternotomy wires are present. Partially visualized hardware over the aortic root. Hepatobiliary: Previous cholecystectomy. Subcentimeter cyst over the dome of the liver. Biliary tree is normal. Pancreas: Normal. Spleen: Previous splenectomy. Several small splenic remnants over the left lateral abdomen unchanged. Adrenals/Urinary Tract: Adrenal glands are normal. Kidneys are normal in size without hydronephrosis or nephrolithiasis. Ureters and bladder are unremarkable. Stomach/Bowel: Surgical clips over the proximal stomach. Small bowel is within normal. Appendix is normal. There is significant diverticulosis over the ascending colon as well as the distal descending and sigmoid colon. No definite diverticulitis. The arterial phase images demonstrate hyperdense material within the midportion of the ascending colon with mild pooling within the dependent portion of the ascending colon as this appears to be originating from a posterior diverticula compatible with acute bleed. The venous phase images again demonstrate this hyperdense material which is more dense and concentrated along the anterior aspect of the ascending colon at the base of several additional diverticula. No evidence of perforation/free peritoneal air. Lymphatic: No adenopathy. Reproductive: Normal. Other: Postsurgical change of the anterior abdominal wall likely from previous hernia repair. Musculoskeletal: Old pelvic fractures. Mild degenerative change of the hips and spine. IMPRESSION: 1. Acute bleed within the midportion of the ascending colon likely originating from a posterior diverticula, although more concentrated adjacent the base of an anterior diverticula in this same region on the delayed images. No evidence of  perforation/free peritoneal air. 2. Significant diverticulosis over the ascending colon as well as the distal descending and sigmoid colon. No acute diverticulitis. 3. Previous cholecystectomy and splenectomy. Subcentimeter hepatic cyst. Critical Value/emergent results were called by telephone at the time of interpretation on 07/04/2023 at 1:07 pm to provider Lauren Roemildt, who verbally acknowledged these results. Electronically Signed   By: Elberta Fortis M.D.   On: 07/04/2023 13:09    Review of Systems Blood pressure 125/78, pulse 99, temperature 98.7 F (37.1 C), temperature source Oral, resp. rate 18, height 6' (1.829 m), weight 105.9 kg, SpO2 98%. Physical Exam  Assessment/Plan: ***  Charna Elizabeth 07/05/2023, 2:18 PM

## 2023-07-05 NOTE — Progress Notes (Signed)
Patient continues to have large bloody stools, he's had a total of 6 since the beginning of this nurses shift, on call provide was made aware and chart was reviewed, H&H was obtained and resulted, currently is 11.9/36.6, LR infusing at 126ml/hr, vitals are within normal limits at this time, will continue to monitor overnight, NP paged to come speak with patient and his wife due to patient and his wife feels that no one is taking his condition seriously and they have yet to speak with a doctor. Will also inform charge nurse of situation.

## 2023-07-05 NOTE — Progress Notes (Signed)
ANTICOAGULATION CONSULT NOTE - Initial Consult  Pharmacy Consult for heparin Indication:  Warfarin (for afib, mechanical aortic valve) on hold. Heparin bridge for subtherapeutic INR <2.5.   Allergies  Allergen Reactions   Ibuprofen-Acetaminophen Other (See Comments)    Advise by doctor, Due to kidney issues    Patient Measurements: Height: 6' (182.9 cm) Weight: 105.9 kg (233 lb 7.5 oz) IBW/kg (Calculated) : 77.6 Heparin Dosing Weight: 100 kg  Vital Signs: Temp: 99.3 F (37.4 C) (09/16 0750) Temp Source: Oral (09/16 0750) BP: 123/75 (09/16 1100) Pulse Rate: 90 (09/16 1100)  Labs: Recent Labs    07/04/23 1130 07/04/23 2152 07/05/23 0317 07/05/23 0739 07/05/23 1031  HGB 13.3   < > 11.0* 10.4* 10.3*  HCT 41.2   < > 32.8* 31.6* 30.8*  PLT 319  --   --  224  --   LABPROT 27.0*  --  28.8* 28.3*  --   INR 2.5*  --  2.7* 2.6*  --   CREATININE 1.53*  --  1.31* 1.48*  --    < > = values in this interval not displayed.    Estimated Creatinine Clearance: 74.3 mL/min (A) (by C-G formula based on SCr of 1.48 mg/dL (H)).   Medical History: Past Medical History:  Diagnosis Date   Aortic aneurysm without rupture (HCC)    Chest pain of uncertain etiology 08/11/2021   CKD (chronic kidney disease)    GERD (gastroesophageal reflux disease)    Heart failure (HCC)    Hypertension    Obesity (BMI 30-39.9) 08/11/2021   Sleep apnea     Medications: Warfarin PTA for afib, mechanical aortic valve  Information obtained from [facility] clinic note on 06/29/23: -INR goal: 2.5 - 3.5 -Home dose: warfarin 5 mg PO daily -Note initiation of amiodarone in August  Assessment: Pt is a 67 yoM admitted with rectal bleeding with associated dizziness/lightheadedness. CTA revealed acute bleeding in the ascending colon, diverticular. Pt underwent transfusion and embolization by IR on 9/15. No reversal agents given.   Pt chronically anticoagulated with warfarin for afib, mechanical aortic valve placed in  July 2024. Pharmacy consulted to initiate heparin for bridge therapy once INR is subtherapeutic <2.5.   Today, 07/05/23 INR = 2.6 this morning, remains therapeutic CBC: Hgb low, Plt WNL. H/H q6h ordered through this afternoon  Goal of Therapy:  INR 2.5 - 3.5 Heparin level 0.3 - 0.7 Monitor platelets by anticoagulation protocol: Yes   Plan:  Recheck INR this evening CBC, INR daily Once INR <2.5, initiate heparin infusion  Follow for ability to restart warfarin  Cindi Carbon, PharmD 07/05/2023,11:23 AM

## 2023-07-05 NOTE — Progress Notes (Signed)
   07/05/23 2321  BiPAP/CPAP/SIPAP  BiPAP/CPAP/SIPAP Pt Type Adult  Reason BIPAP/CPAP not in use Non-compliant

## 2023-07-06 DIAGNOSIS — D62 Acute posthemorrhagic anemia: Secondary | ICD-10-CM | POA: Diagnosis not present

## 2023-07-06 DIAGNOSIS — D120 Benign neoplasm of cecum: Secondary | ICD-10-CM | POA: Diagnosis not present

## 2023-07-06 DIAGNOSIS — D124 Benign neoplasm of descending colon: Secondary | ICD-10-CM | POA: Diagnosis not present

## 2023-07-06 DIAGNOSIS — K633 Ulcer of intestine: Secondary | ICD-10-CM | POA: Diagnosis not present

## 2023-07-06 DIAGNOSIS — K922 Gastrointestinal hemorrhage, unspecified: Secondary | ICD-10-CM | POA: Diagnosis not present

## 2023-07-06 DIAGNOSIS — Z7901 Long term (current) use of anticoagulants: Secondary | ICD-10-CM | POA: Diagnosis not present

## 2023-07-06 DIAGNOSIS — D125 Benign neoplasm of sigmoid colon: Secondary | ICD-10-CM | POA: Diagnosis not present

## 2023-07-06 LAB — BASIC METABOLIC PANEL
Anion gap: 7 (ref 5–15)
BUN: 15 mg/dL (ref 6–20)
CO2: 25 mmol/L (ref 22–32)
Calcium: 7.9 mg/dL — ABNORMAL LOW (ref 8.9–10.3)
Chloride: 101 mmol/L (ref 98–111)
Creatinine, Ser: 1.38 mg/dL — ABNORMAL HIGH (ref 0.61–1.24)
GFR, Estimated: >60 mL/min (ref >60)
Glucose, Bld: 103 mg/dL — ABNORMAL HIGH (ref 70–99)
Potassium: 3.9 mmol/L (ref 3.5–5.1)
Sodium: 133 mmol/L — ABNORMAL LOW (ref 135–145)

## 2023-07-06 LAB — PROCALCITONIN: Procalcitonin: <0.1 ng/mL

## 2023-07-06 LAB — PROTIME-INR
INR: 2.7 — ABNORMAL HIGH (ref 0.8–1.2)
Prothrombin Time: 28.8 s — ABNORMAL HIGH (ref 11.4–15.2)

## 2023-07-06 LAB — HEMOGLOBIN AND HEMATOCRIT, BLOOD
HCT: 28.5 % — ABNORMAL LOW (ref 39.0–52.0)
Hemoglobin: 9.2 g/dL — ABNORMAL LOW (ref 13.0–17.0)

## 2023-07-06 LAB — CBC
HCT: 29.5 % — ABNORMAL LOW (ref 39.0–52.0)
Hemoglobin: 9.7 g/dL — ABNORMAL LOW (ref 13.0–17.0)
MCH: 31 pg (ref 26.0–34.0)
MCHC: 32.9 g/dL (ref 30.0–36.0)
MCV: 94.2 fL (ref 80.0–100.0)
Platelets: 234 10*3/uL (ref 150–400)
RBC: 3.13 MIL/uL — ABNORMAL LOW (ref 4.22–5.81)
RDW: 14.7 % (ref 11.5–15.5)
WBC: 13.9 10*3/uL — ABNORMAL HIGH (ref 4.0–10.5)
nRBC: 0 % (ref 0.0–0.2)

## 2023-07-06 NOTE — Plan of Care (Signed)
  Problem: Education: Goal: Will demonstrate proper wound care and an understanding of methods to prevent future damage Outcome: Progressing Goal: Knowledge of disease or condition will improve Outcome: Progressing   Problem: Cardiac: Goal: Will achieve and/or maintain hemodynamic stability Outcome: Progressing   Problem: Clinical Measurements: Goal: Postoperative complications will be avoided or minimized Outcome: Progressing   Problem: Respiratory: Goal: Respiratory status will improve Outcome: Progressing

## 2023-07-06 NOTE — Plan of Care (Signed)
  Problem: Education: Goal: Will demonstrate proper wound care and an understanding of methods to prevent future damage Outcome: Progressing Goal: Knowledge of disease or condition will improve Outcome: Progressing Goal: Knowledge of the prescribed therapeutic regimen will improve Outcome: Progressing Goal: Individualized Educational Video(s) Outcome: Progressing   Problem: Activity: Goal: Risk for activity intolerance will decrease Outcome: Progressing   Problem: Cardiac: Goal: Will achieve and/or maintain hemodynamic stability Outcome: Progressing   Problem: Clinical Measurements: Goal: Postoperative complications will be avoided or minimized Outcome: Progressing   Problem: Respiratory: Goal: Respiratory status will improve Outcome: Progressing   Problem: Skin Integrity: Goal: Wound healing without signs and symptoms of infection Outcome: Progressing Goal: Risk for impaired skin integrity will decrease Outcome: Progressing   Problem: Urinary Elimination: Goal: Ability to achieve and maintain adequate renal perfusion and functioning will improve Outcome: Progressing   Problem: Education: Goal: Understanding of CV disease, CV risk reduction, and recovery process will improve Outcome: Progressing Goal: Individualized Educational Video(s) Outcome: Progressing   Problem: Activity: Goal: Ability to return to baseline activity level will improve Outcome: Progressing   Problem: Cardiovascular: Goal: Ability to achieve and maintain adequate cardiovascular perfusion will improve Outcome: Progressing Goal: Vascular access site(s) Level 0-1 will be maintained Outcome: Progressing   Problem: Health Behavior/Discharge Planning: Goal: Ability to safely manage health-related needs after discharge will improve Outcome: Progressing   Problem: Education: Goal: Knowledge of General Education information will improve Description: Including pain rating scale, medication(s)/side  effects and non-pharmacologic comfort measures Outcome: Progressing   Problem: Health Behavior/Discharge Planning: Goal: Ability to manage health-related needs will improve Outcome: Progressing   Problem: Clinical Measurements: Goal: Ability to maintain clinical measurements within normal limits will improve Outcome: Progressing Goal: Will remain free from infection Outcome: Progressing Goal: Diagnostic test results will improve Outcome: Progressing Goal: Respiratory complications will improve Outcome: Progressing Goal: Cardiovascular complication will be avoided Outcome: Progressing   Problem: Activity: Goal: Risk for activity intolerance will decrease Outcome: Progressing   Problem: Nutrition: Goal: Adequate nutrition will be maintained Outcome: Progressing   Problem: Coping: Goal: Level of anxiety will decrease Outcome: Progressing   Problem: Elimination: Goal: Will not experience complications related to bowel motility Outcome: Progressing Goal: Will not experience complications related to urinary retention Outcome: Progressing   Problem: Pain Managment: Goal: General experience of comfort will improve Outcome: Progressing   Problem: Safety: Goal: Ability to remain free from injury will improve Outcome: Progressing   Problem: Skin Integrity: Goal: Risk for impaired skin integrity will decrease Outcome: Progressing   Problem: Education: Goal: Understanding of CV disease, CV risk reduction, and recovery process will improve Outcome: Progressing Goal: Individualized Educational Video(s) Outcome: Progressing   Problem: Activity: Goal: Ability to return to baseline activity level will improve Outcome: Progressing   Problem: Cardiovascular: Goal: Ability to achieve and maintain adequate cardiovascular perfusion will improve Outcome: Progressing Goal: Vascular access site(s) Level 0-1 will be maintained Outcome: Progressing   Problem: Health  Behavior/Discharge Planning: Goal: Ability to safely manage health-related needs after discharge will improve Outcome: Progressing

## 2023-07-06 NOTE — Consult Note (Addendum)
Consultation  Referring Provider:  [facility]  Primary Care Physician:  Marrianne Mood, MD Primary Gastroenterologist:  Dr. Marina Goodell       Reason for Consultation:   Lower GI bleed    LOS: 2 days          HPI:   Timothy Browning is a 52 y.o. 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  Initially presented 07/04/2023 with rectal bleeding and lightheadedness.  9/15 a.m. patient woke up and had a bowel movement that was dark red mixed with stool and had additional episodes of passing blood clots.  Upon arrival patient was hypotensive with BP 68/49.  CTA showed acute bleed within the midportion of the ascending colon likely originating from posterior diverticula.  Normal BUN, normal hemoglobin (13.3).  Patient was given IV Protonix, IVF, and taken by IR for emergent embolization which was successful.  Since embolization patient had 1 episode of dark red stool 9/16.  Today (9/17) has had a dark black formed stool with streaks of red. He feels his bowel movements have improved since admission. Denies nausea/vomiting. Denies weight loss. No previous episodes of rectal bleeding similar to this. He reports history of scant blood on tissue paper rarely after wiping too vigorously.  Reports some mild RLQ tenderness that started after IR embolization. Nothing makes it better or worse. Pain is intermittent without identifiable triggers.  Hemoglobin today 9.7 > 10.3 > 10.4 > 11.0. was given 1 unit PRBCs 9/15. INR 2.7 and has been off Coumadin since 9/15.  PREVIOUS GI WORKUP   No history EGD/colonoscopy  Past Medical History:  Diagnosis Date   Aortic aneurysm without rupture (HCC)    Chest pain of uncertain etiology 08/11/2021   CKD (chronic kidney disease)    GERD (gastroesophageal reflux disease)    Heart failure (HCC)    Hypertension    Obesity (BMI 30-39.9)  08/11/2021   Sleep apnea     Surgical History:  He  has a past surgical history that includes Femoral bypass; Splenectomy, total; Cholecystectomy (N/A, 12/30/2016); LEFT HEART CATH AND CORONARY ANGIOGRAPHY (N/A, 07/15/2022); Replacement ascending aorta (N/A, 05/06/2023); TEE without cardioversion (N/A, 05/06/2023); IR EMBO ART  VEN HEMORR LYMPH EXTRAV  INC GUIDE ROADMAPPING (07/04/2023); IR Angiogram Selective Each Additional Vessel (07/04/2023); IR Angiogram Selective Each Additional Vessel (07/04/2023); IR Angiogram Selective Each Additional Vessel (07/04/2023); IR Angiogram Visceral Selective (07/04/2023); IR Angiogram Selective Each Additional Vessel (07/04/2023); IR US Guide Vasc Access Right (07/04/2023); and IR Angiogram Visceral Selective (07/04/2023). Family History:  His family history includes Cancer in his maternal grandfather and sister; Hearing loss in his maternal grandmother. Social History:   reports that he has never smoked. He has never used smokeless tobacco. He reports current alcohol use. He reports that he does not use drugs.  Prior to Admission medications   Medication Sig Start Date End Date Taking? Authorizing Provider  amiodarone (PACERONE) 200 MG tablet Amiodarone 400 mg (two tablets) twice daily for 1 week, THEN 200 twice daily until seen in office Patient taking differently: Take 200 mg by mouth 2 (two) times daily. 05/28/23  Yes Tobb, Kardie, DO  aspirin EC 81 MG tablet Take 1 tablet (81 mg total) by mouth daily. Swallow whole. 05/14/23  Yes Roddenberry, Cecille [place], PA-C  cyclobenzaprine (FLEXERIL) 5 MG tablet Take 5 mg by mouth 3 (three) times daily as needed for  muscle spasms.   Yes [provider]  metoprolol succinate (TOPROL-XL) 25 MG 24 hr tablet Take 0.5 tablets (12.5 mg total) by mouth in the morning and at bedtime. 06/23/23  Yes Tobb, Kardie, DO  rosuvastatin (CRESTOR) 5 MG tablet Take 1 tablet (5 mg total) by mouth daily. Patient taking differently: Take 5 mg by mouth  at bedtime. 09/23/22  Yes Masters, Katie, DO  sildenafil (VIAGRA) 25 MG tablet Take 100 mg by mouth daily as needed for erectile dysfunction.   Yes [provider]  warfarin (COUMADIN) 2.5 MG tablet Take 2 tablets (5 mg total) by mouth daily. Or as directed by the [facility] 05/14/23 05/13/24 Yes Roddenberry, Myron G, PA-C  furosemide (LASIX) 20 MG tablet Take 1 tablet (20 mg total) by mouth daily, for 3 days then call provider for further instructions Patient not taking: Reported on 07/04/2023 05/17/23   Tobb, Lavona Mound, DO    Current Facility-Administered Medications  Medication Dose Route Frequency Provider Last Rate Last Admin   acetaminophen (TYLENOL) tablet 650 mg  650 mg Oral Q6H PRN Opyd, Lavone Neri, MD   650 mg at 07/05/23 2329   Or   acetaminophen (TYLENOL) suppository 650 mg  650 mg Rectal Q6H PRN Opyd, Lavone Neri, MD       amiodarone (PACERONE) tablet 200 mg  200 mg Oral BID Opyd, Lavone Neri, MD   200 mg at 07/06/23 0900   Chlorhexidine Gluconate Cloth 2 % PADS 6 each  6 each Topical Daily Opyd, Lavone Neri, MD   6 each at 07/06/23 1402   fentaNYL (SUBLIMAZE) injection 25-50 mcg  25-50 mcg Intravenous Q2H PRN Opyd, Lavone Neri, MD   50 mcg at 07/04/23 1713   ondansetron (ZOFRAN) tablet 4 mg  4 mg Oral Q6H PRN Opyd, Lavone Neri, MD       Or   ondansetron (ZOFRAN) injection 4 mg  4 mg Intravenous Q6H PRN Opyd, Lavone Neri, MD       Oral care mouth rinse  15 mL Mouth Rinse PRN Narda Bonds, MD       sodium chloride flush (NS) 0.9 % injection 3 mL  3 mL Intravenous Q12H Opyd, Lavone Neri, MD   3 mL at 07/06/23 0900    Allergies as of 07/04/2023 - Review Complete 07/04/2023  Allergen Reaction Noted   Ibuprofen-acetaminophen Other (See Comments) 03/19/2023    Review of Systems  Constitutional:  Negative for chills, fever and weight loss.  HENT:  Negative for hearing loss and tinnitus.   Eyes:  Negative for blurred vision and double vision.  Respiratory:  Negative for cough and  hemoptysis.   Cardiovascular:  Negative for chest pain and palpitations.  Gastrointestinal:  Positive for abdominal pain, blood in stool and melena. Negative for constipation, diarrhea, heartburn, nausea and vomiting.  Genitourinary:  Negative for dysuria and urgency.  Musculoskeletal:  Negative for myalgias and neck pain.  Skin:  Negative for itching and rash.  Neurological:  Negative for seizures and loss of consciousness.  Psychiatric/Behavioral:  Negative for depression and suicidal ideas.        Physical Exam:  Vital signs in last 24 hours: Temp:  [98.4 F (36.9 C)-100.4 F (38 C)] 98.4 F (36.9 C) (09/17 1139) Pulse Rate:  [85-101] 87 (09/17 1400) Resp:  [15-26] 23 (09/17 1400) BP: (115-139)/(55-80) 130/75 (09/17 1200) SpO2:  [94 %-100 %] 94 % (09/17 1400) Weight:  [107.9 kg] 107.9 kg (09/17 0424) Last BM Date : 07/05/23 Last BM  recorded by nurses in past 5 days Stool Type: Type 7 (Liquid consistency with no solid pieces) (07/05/2023  1:54 PM)  Physical Exam Constitutional:      Appearance: Normal appearance.  HENT:     Head: Normocephalic and atraumatic.     Nose: No congestion.     Mouth/Throat:     Mouth: Mucous membranes are moist.     Pharynx: Oropharynx is clear.  Eyes:     Extraocular Movements: Extraocular movements intact.     Conjunctiva/sclera: Conjunctivae normal.  Cardiovascular:     Rate and Rhythm: Normal rate and regular rhythm.  Pulmonary:     Effort: Pulmonary effort is normal. No respiratory distress.  Abdominal:     General: Bowel sounds are normal.     Palpations: Abdomen is soft.     Tenderness: There is abdominal tenderness (RLQ).  Musculoskeletal:     Cervical back: Normal range of motion and neck supple.  Skin:    General: Skin is warm and dry.     Coloration: Skin is not jaundiced.  Neurological:     General: No focal deficit present.     Mental Status: He is alert and oriented to person, place, and time.  Psychiatric:        Mood  and Affect: Mood normal.        Behavior: Behavior normal.        Thought Content: Thought content normal.        Judgment: Judgment normal.      LAB RESULTS: Recent Labs    07/04/23 1130 07/04/23 2152 07/05/23 0739 07/05/23 1031 07/06/23 0310  WBC 4.3  --  12.3*  --  13.9*  HGB 13.3   < > 10.4* 10.3* 9.7*  HCT 41.2   < > 31.6* 30.8* 29.5*  PLT 319  --  224  --  234   < > = values in this interval not displayed.   BMET Recent Labs    07/05/23 0317 07/05/23 0739 07/06/23 0310  NA 134* 133* 133*  K 4.2 4.0 3.9  CL 105 105 101  CO2 22 22 25   GLUCOSE 127* 113* 103*  BUN 18 20 15   CREATININE 1.31* 1.48* 1.38*  CALCIUM 7.9* 7.8* 7.9*   LFT Recent Labs    07/04/23 1130  PROT 6.6  ALBUMIN 3.6  AST 17  ALT 24  ALKPHOS 56  BILITOT 0.7   PT/INR Recent Labs    07/05/23 1844 07/06/23 0310  LABPROT 28.4* 28.8*  INR 2.6* 2.7*    STUDIES: IR US Guide Vasc Access Right  Result Date: 07/05/2023 INDICATION: 52 year old male on Coumadin with for a prior aortic valve replacement. 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 EXAM: IR EMBO ART VEN HEMORR LYMPH EXTRAV INC GUIDE ROADMAPPING; ADDITIONAL ARTERIOGRAPHY MEDICATIONS: None. ANESTHESIA/SEDATION: Moderate (conscious) sedation was employed during this procedure. A total of Versed 2 mg and Fentanyl 50 mcg was administered intravenously by radiology nursing. Moderate Sedation Time: 70 minutes. The patient's level of consciousness and vital signs were monitored continuously by radiology nursing throughout the procedure under my direct supervision. CONTRAST:  100 mL Omnipaque 300 FLUOROSCOPY: Radiation Exposure Index (as provided by the fluoroscopic device): 2,922 mGy Kerma COMPLICATIONS: None immediate. PROCEDURE: Informed consent was obtained from the patient following explanation of the procedure, risks, benefits and alternatives. The patient understands, agrees and  consents for the procedure. All questions were addressed. A time out was performed prior  to the initiation of the procedure. Maximal barrier sterile technique utilized including caps, mask, sterile gowns, sterile gloves, large sterile drape, hand hygiene, and Betadine prep. The right common femoral artery was interrogated with ultrasound and found to be widely patent. An image was obtained and stored for the medical record. Local anesthesia was attained by infiltration with 1% lidocaine. A small dermatotomy was made. Under real-time sonographic guidance, the vessel was punctured with a 21 gauge micropuncture needle. Using standard technique, the initial micro needle was exchanged over a 0.018 micro wire for a transitional 4 [place] micro sheath. The micro sheath was then exchanged over a 0.035 wire for a 5 French vascular sheath. A C2 cobra catheter was advanced over a Bentson wire and used to select the celiac artery. A celiac arteriogram was performed. No evidence of active bleeding. No evidence of replaced colic artery. The C2 cobra catheter was next advanced into the superior mesenteric artery. A arteriogram was performed. There is evidence of active bleeding arising from the terminal branch in the ascending colon as expected from the prior CTA. A Cook can tot a microcatheter was advanced into the ileocolic. An arteriogram was performed. The active bleeding branch does not appear to arise from the distal ileo colic artery. The microcatheter was next advanced into the right colic artery. Arteriography was performed. The hemorrhage is again visualized. Have appears to arise at the anti mesenteric border of the colon interval region of potentially several feeding distal branch arteries. The microcatheter was advanced into the first potential arterial feeder. Contrast injection confirms active hemorrhage. Coil embolization was performed utilizing a series of 1 and 2 mm low-profile penumbra detachable coils. The  microcatheter was brought back. Contrast injection was performed. The embolized vessel is successfully embolized. However, additional bleeding is occurring from a more cephalad branch. The microcatheter was successfully navigated into this branch. Contrast injection was performed confirming active bleeding. Coil embolization was then performed. During the coil embolization there was some extravasation of contrast likely from a coil protruding through the very small arterial wall. Additional coil embolization was performed with successful cessation of hemorrhage. Follow-up contrast injection again performed there is a third tiny distal branch artery still contributing 2 bleeding at the diverticular site. The microcatheter was advanced into this third artery and additional coil embolization was performed. As before, a combination of low-profile penumbra detachable coils in 2 and 3 mm diameters were deployed. Final arteriography demonstrates cessation bleeding. The catheter system was removed. Hemostasis was attained with the assistance of a Celt arterial closure device. IMPRESSION: Successful embolization of distal branches of the right colic artery which were providing blood flow to the hemorrhaging ascending colonic diverticulum. No evidence of continued bleeding at the end of the procedure. Electronically Signed   By: Malachy Moan M.D.   On: 07/05/2023 12:26   IR Angiogram Visceral Selective  Result Date: 07/05/2023 INDICATION: 52 year old male on Coumadin with for a prior aortic valve replacement. 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 EXAM: IR EMBO ART VEN HEMORR LYMPH EXTRAV INC GUIDE ROADMAPPING; ADDITIONAL ARTERIOGRAPHY MEDICATIONS: None. ANESTHESIA/SEDATION: Moderate (conscious) sedation was employed during this procedure. A total of Versed 2 mg and Fentanyl 50 mcg was administered intravenously by radiology nursing. Moderate Sedation  Time: 70 minutes. The patient's level of consciousness and vital signs were monitored continuously by radiology nursing throughout the procedure under my direct supervision. CONTRAST:  100 mL Omnipaque 300 FLUOROSCOPY: Radiation Exposure Index (as  provided by the fluoroscopic device): 2,922 mGy Kerma COMPLICATIONS: None immediate. PROCEDURE: Informed consent was obtained from the patient following explanation of the procedure, risks, benefits and alternatives. The patient understands, agrees and consents for the procedure. All questions were addressed. A time out was performed prior to the initiation of the procedure. Maximal barrier sterile technique utilized including caps, mask, sterile gowns, sterile gloves, large sterile drape, hand hygiene, and Betadine prep. The right common femoral artery was interrogated with ultrasound and found to be widely patent. An image was obtained and stored for the medical record. Local anesthesia was attained by infiltration with 1% lidocaine. A small dermatotomy was made. Under real-time sonographic guidance, the vessel was punctured with a 21 gauge micropuncture needle. Using standard technique, the initial micro needle was exchanged over a 0.018 micro wire for a transitional 4 [place] micro sheath. The micro sheath was then exchanged over a 0.035 wire for a 5 French vascular sheath. A C2 cobra catheter was advanced over a Bentson wire and used to select the celiac artery. A celiac arteriogram was performed. No evidence of active bleeding. No evidence of replaced colic artery. The C2 cobra catheter was next advanced into the superior mesenteric artery. A arteriogram was performed. There is evidence of active bleeding arising from the terminal branch in the ascending colon as expected from the prior CTA. A Cook can tot a microcatheter was advanced into the ileocolic. An arteriogram was performed. The active bleeding branch does not appear to arise from the distal ileo colic  artery. The microcatheter was next advanced into the right colic artery. Arteriography was performed. The hemorrhage is again visualized. Have appears to arise at the anti mesenteric border of the colon interval region of potentially several feeding distal branch arteries. The microcatheter was advanced into the first potential arterial feeder. Contrast injection confirms active hemorrhage. Coil embolization was performed utilizing a series of 1 and 2 mm low-profile penumbra detachable coils. The microcatheter was brought back. Contrast injection was performed. The embolized vessel is successfully embolized. However, additional bleeding is occurring from a more cephalad branch. The microcatheter was successfully navigated into this branch. Contrast injection was performed confirming active bleeding. Coil embolization was then performed. During the coil embolization there was some extravasation of contrast likely from a coil protruding through the very small arterial wall. Additional coil embolization was performed with successful cessation of hemorrhage. Follow-up contrast injection again performed there is a third tiny distal branch artery still contributing 2 bleeding at the diverticular site. The microcatheter was advanced into this third artery and additional coil embolization was performed. As before, a combination of low-profile penumbra detachable coils in 2 and 3 mm diameters were deployed. Final arteriography demonstrates cessation bleeding. The catheter system was removed. Hemostasis was attained with the assistance of a Celt arterial closure device. IMPRESSION: Successful embolization of distal branches of the right colic artery which were providing blood flow to the hemorrhaging ascending colonic diverticulum. No evidence of continued bleeding at the end of the procedure. Electronically Signed   By: Malachy Moan M.D.   On: 07/05/2023 12:26   IR Angiogram Visceral Selective  Result Date:  07/05/2023 INDICATION: 52 year old male on Coumadin with for a prior aortic valve replacement. 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 EXAM: IR EMBO ART VEN HEMORR LYMPH EXTRAV INC GUIDE ROADMAPPING; ADDITIONAL ARTERIOGRAPHY MEDICATIONS: None. ANESTHESIA/SEDATION: Moderate (conscious) sedation was employed during this procedure. A total of Versed 2 mg  and Fentanyl 50 mcg was administered intravenously by radiology nursing. Moderate Sedation Time: 70 minutes. The patient's level of consciousness and vital signs were monitored continuously by radiology nursing throughout the procedure under my direct supervision. CONTRAST:  100 mL Omnipaque 300 FLUOROSCOPY: Radiation Exposure Index (as provided by the fluoroscopic device): 2,922 mGy Kerma COMPLICATIONS: None immediate. PROCEDURE: Informed consent was obtained from the patient following explanation of the procedure, risks, benefits and alternatives. The patient understands, agrees and consents for the procedure. All questions were addressed. A time out was performed prior to the initiation of the procedure. Maximal barrier sterile technique utilized including caps, mask, sterile gowns, sterile gloves, large sterile drape, hand hygiene, and Betadine prep. The right common femoral artery was interrogated with ultrasound and found to be widely patent. An image was obtained and stored for the medical record. Local anesthesia was attained by infiltration with 1% lidocaine. A small dermatotomy was made. Under real-time sonographic guidance, the vessel was punctured with a 21 gauge micropuncture needle. Using standard technique, the initial micro needle was exchanged over a 0.018 micro wire for a transitional 4 [place] micro sheath. The micro sheath was then exchanged over a 0.035 wire for a 5 French vascular sheath. A C2 cobra catheter was advanced over a Bentson wire and used to select the celiac artery. A  celiac arteriogram was performed. No evidence of active bleeding. No evidence of replaced colic artery. The C2 cobra catheter was next advanced into the superior mesenteric artery. A arteriogram was performed. There is evidence of active bleeding arising from the terminal branch in the ascending colon as expected from the prior CTA. A Cook can tot a microcatheter was advanced into the ileocolic. An arteriogram was performed. The active bleeding branch does not appear to arise from the distal ileo colic artery. The microcatheter was next advanced into the right colic artery. Arteriography was performed. The hemorrhage is again visualized. Have appears to arise at the anti mesenteric border of the colon interval region of potentially several feeding distal branch arteries. The microcatheter was advanced into the first potential arterial feeder. Contrast injection confirms active hemorrhage. Coil embolization was performed utilizing a series of 1 and 2 mm low-profile penumbra detachable coils. The microcatheter was brought back. Contrast injection was performed. The embolized vessel is successfully embolized. However, additional bleeding is occurring from a more cephalad branch. The microcatheter was successfully navigated into this branch. Contrast injection was performed confirming active bleeding. Coil embolization was then performed. During the coil embolization there was some extravasation of contrast likely from a coil protruding through the very small arterial wall. Additional coil embolization was performed with successful cessation of hemorrhage. Follow-up contrast injection again performed there is a third tiny distal branch artery still contributing 2 bleeding at the diverticular site. The microcatheter was advanced into this third artery and additional coil embolization was performed. As before, a combination of low-profile penumbra detachable coils in 2 and 3 mm diameters were deployed. Final arteriography  demonstrates cessation bleeding. The catheter system was removed. Hemostasis was attained with the assistance of a Celt arterial closure device. IMPRESSION: Successful embolization of distal branches of the right colic artery which were providing blood flow to the hemorrhaging ascending colonic diverticulum. No evidence of continued bleeding at the end of the procedure. Electronically Signed   By: Malachy Moan M.D.   On: 07/05/2023 12:26   IR Angiogram Selective Each Additional Vessel  Result Date: 07/05/2023 INDICATION: 52 year old male on Coumadin with for a prior  aortic valve replacement. 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 EXAM: IR EMBO ART VEN HEMORR LYMPH EXTRAV INC GUIDE ROADMAPPING; ADDITIONAL ARTERIOGRAPHY MEDICATIONS: None. ANESTHESIA/SEDATION: Moderate (conscious) sedation was employed during this procedure. A total of Versed 2 mg and Fentanyl 50 mcg was administered intravenously by radiology nursing. Moderate Sedation Time: 70 minutes. The patient's level of consciousness and vital signs were monitored continuously by radiology nursing throughout the procedure under my direct supervision. CONTRAST:  100 mL Omnipaque 300 FLUOROSCOPY: Radiation Exposure Index (as provided by the fluoroscopic device): 2,922 mGy Kerma COMPLICATIONS: None immediate. PROCEDURE: Informed consent was obtained from the patient following explanation of the procedure, risks, benefits and alternatives. The patient understands, agrees and consents for the procedure. All questions were addressed. A time out was performed prior to the initiation of the procedure. Maximal barrier sterile technique utilized including caps, mask, sterile gowns, sterile gloves, large sterile drape, hand hygiene, and Betadine prep. The right common femoral artery was interrogated with ultrasound and found to be widely patent. An image was obtained and stored for the medical  record. Local anesthesia was attained by infiltration with 1% lidocaine. A small dermatotomy was made. Under real-time sonographic guidance, the vessel was punctured with a 21 gauge micropuncture needle. Using standard technique, the initial micro needle was exchanged over a 0.018 micro wire for a transitional 4 [place] micro sheath. The micro sheath was then exchanged over a 0.035 wire for a 5 French vascular sheath. A C2 cobra catheter was advanced over a Bentson wire and used to select the celiac artery. A celiac arteriogram was performed. No evidence of active bleeding. No evidence of replaced colic artery. The C2 cobra catheter was next advanced into the superior mesenteric artery. A arteriogram was performed. There is evidence of active bleeding arising from the terminal branch in the ascending colon as expected from the prior CTA. A Cook can tot a microcatheter was advanced into the ileocolic. An arteriogram was performed. The active bleeding branch does not appear to arise from the distal ileo colic artery. The microcatheter was next advanced into the right colic artery. Arteriography was performed. The hemorrhage is again visualized. Have appears to arise at the anti mesenteric border of the colon interval region of potentially several feeding distal branch arteries. The microcatheter was advanced into the first potential arterial feeder. Contrast injection confirms active hemorrhage. Coil embolization was performed utilizing a series of 1 and 2 mm low-profile penumbra detachable coils. The microcatheter was brought back. Contrast injection was performed. The embolized vessel is successfully embolized. However, additional bleeding is occurring from a more cephalad branch. The microcatheter was successfully navigated into this branch. Contrast injection was performed confirming active bleeding. Coil embolization was then performed. During the coil embolization there was some extravasation of contrast likely  from a coil protruding through the very small arterial wall. Additional coil embolization was performed with successful cessation of hemorrhage. Follow-up contrast injection again performed there is a third tiny distal branch artery still contributing 2 bleeding at the diverticular site. The microcatheter was advanced into this third artery and additional coil embolization was performed. As before, a combination of low-profile penumbra detachable coils in 2 and 3 mm diameters were deployed. Final arteriography demonstrates cessation bleeding. The catheter system was removed. Hemostasis was attained with the assistance of a Celt arterial closure device. IMPRESSION: Successful embolization of distal branches of the right colic artery which were providing blood flow to the hemorrhaging  ascending colonic diverticulum. No evidence of continued bleeding at the end of the procedure. Electronically Signed   By: Malachy Moan M.D.   On: 07/05/2023 12:26   IR EMBO ART  VEN HEMORR LYMPH EXTRAV  INC GUIDE ROADMAPPING  Result Date: 07/04/2023 INDICATION: 52 year old male on Coumadin with for a prior aortic valve replacement. 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 EXAM: IR EMBO ART VEN HEMORR LYMPH EXTRAV INC GUIDE ROADMAPPING; ADDITIONAL ARTERIOGRAPHY MEDICATIONS: None. ANESTHESIA/SEDATION: Moderate (conscious) sedation was employed during this procedure. A total of Versed 2 mg and Fentanyl 50 mcg was administered intravenously by radiology nursing. Moderate Sedation Time: 70 minutes. The patient's level of consciousness and vital signs were monitored continuously by radiology nursing throughout the procedure under my direct supervision. CONTRAST:  100 mL Omnipaque 300 FLUOROSCOPY: Radiation Exposure Index (as provided by the fluoroscopic device): 2,922 mGy Kerma COMPLICATIONS: None immediate. PROCEDURE: Informed consent was obtained from the patient  following explanation of the procedure, risks, benefits and alternatives. The patient understands, agrees and consents for the procedure. All questions were addressed. A time out was performed prior to the initiation of the procedure. Maximal barrier sterile technique utilized including caps, mask, sterile gowns, sterile gloves, large sterile drape, hand hygiene, and Betadine prep. The right common femoral artery was interrogated with ultrasound and found to be widely patent. An image was obtained and stored for the medical record. Local anesthesia was attained by infiltration with 1% lidocaine. A small dermatotomy was made. Under real-time sonographic guidance, the vessel was punctured with a 21 gauge micropuncture needle. Using standard technique, the initial micro needle was exchanged over a 0.018 micro wire for a transitional 4 [place] micro sheath. The micro sheath was then exchanged over a 0.035 wire for a 5 French vascular sheath. A C2 cobra catheter was advanced over a Bentson wire and used to select the celiac artery. A celiac arteriogram was performed. No evidence of active bleeding. No evidence of replaced colic artery. The C2 cobra catheter was next advanced into the superior mesenteric artery. A arteriogram was performed. There is evidence of active bleeding arising from the terminal branch in the ascending colon as expected from the prior CTA. A Cook can tot a microcatheter was advanced into the ileocolic. An arteriogram was performed. The active bleeding branch does not appear to arise from the distal ileo colic artery. The microcatheter was next advanced into the right colic artery. Arteriography was performed. The hemorrhage is again visualized. Have appears to arise at the anti mesenteric border of the colon interval region of potentially several feeding distal branch arteries. The microcatheter was advanced into the first potential arterial feeder. Contrast injection confirms active hemorrhage.  Coil embolization was performed utilizing a series of 1 and 2 mm low-profile penumbra detachable coils. The microcatheter was brought back. Contrast injection was performed. The embolized vessel is successfully embolized. However, additional bleeding is occurring from a more cephalad branch. The microcatheter was successfully navigated into this branch. Contrast injection was performed confirming active bleeding. Coil embolization was then performed. During the coil embolization there was some extravasation of contrast likely from a coil protruding through the very small arterial wall. Additional coil embolization was performed with successful cessation of hemorrhage. Follow-up contrast injection again performed there is a third tiny distal branch artery still contributing 2 bleeding at the diverticular site. The microcatheter was advanced into this third artery and additional coil embolization was performed. As before, a  combination of low-profile penumbra detachable coils in 2 and 3 mm diameters were deployed. Final arteriography demonstrates cessation bleeding. The catheter system was removed. Hemostasis was attained with the assistance of a Celt arterial closure device. IMPRESSION: Successful embolization of distal branches of the right colic artery which were providing blood flow to the hemorrhaging ascending colonic diverticulum. No evidence of continued bleeding at the end of the procedure. Electronically Signed   By: Malachy Moan M.D.   On: 07/04/2023 15:34   IR Angiogram Selective Each Additional Vessel  Result Date: 07/04/2023 INDICATION: 52 year old male on Coumadin with for a prior aortic valve replacement. 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 EXAM: IR EMBO ART VEN HEMORR LYMPH EXTRAV INC GUIDE ROADMAPPING; ADDITIONAL ARTERIOGRAPHY MEDICATIONS: None. ANESTHESIA/SEDATION: Moderate (conscious) sedation was employed during this  procedure. A total of Versed 2 mg and Fentanyl 50 mcg was administered intravenously by radiology nursing. Moderate Sedation Time: 70 minutes. The patient's level of consciousness and vital signs were monitored continuously by radiology nursing throughout the procedure under my direct supervision. CONTRAST:  100 mL Omnipaque 300 FLUOROSCOPY: Radiation Exposure Index (as provided by the fluoroscopic device): 2,922 mGy Kerma COMPLICATIONS: None immediate. PROCEDURE: Informed consent was obtained from the patient following explanation of the procedure, risks, benefits and alternatives. The patient understands, agrees and consents for the procedure. All questions were addressed. A time out was performed prior to the initiation of the procedure. Maximal barrier sterile technique utilized including caps, mask, sterile gowns, sterile gloves, large sterile drape, hand hygiene, and Betadine prep. The right common femoral artery was interrogated with ultrasound and found to be widely patent. An image was obtained and stored for the medical record. Local anesthesia was attained by infiltration with 1% lidocaine. A small dermatotomy was made. Under real-time sonographic guidance, the vessel was punctured with a 21 gauge micropuncture needle. Using standard technique, the initial micro needle was exchanged over a 0.018 micro wire for a transitional 4 [place] micro sheath. The micro sheath was then exchanged over a 0.035 wire for a 5 French vascular sheath. A C2 cobra catheter was advanced over a Bentson wire and used to select the celiac artery. A celiac arteriogram was performed. No evidence of active bleeding. No evidence of replaced colic artery. The C2 cobra catheter was next advanced into the superior mesenteric artery. A arteriogram was performed. There is evidence of active bleeding arising from the terminal branch in the ascending colon as expected from the prior CTA. A Cook can tot a microcatheter was advanced into the  ileocolic. An arteriogram was performed. The active bleeding branch does not appear to arise from the distal ileo colic artery. The microcatheter was next advanced into the right colic artery. Arteriography was performed. The hemorrhage is again visualized. Have appears to arise at the anti mesenteric border of the colon interval region of potentially several feeding distal branch arteries. The microcatheter was advanced into the first potential arterial feeder. Contrast injection confirms active hemorrhage. Coil embolization was performed utilizing a series of 1 and 2 mm low-profile penumbra detachable coils. The microcatheter was brought back. Contrast injection was performed. The embolized vessel is successfully embolized. However, additional bleeding is occurring from a more cephalad branch. The microcatheter was successfully navigated into this branch. Contrast injection was performed confirming active bleeding. Coil embolization was then performed. During the coil embolization there was some extravasation of contrast likely from a coil protruding through the very small arterial  wall. Additional coil embolization was performed with successful cessation of hemorrhage. Follow-up contrast injection again performed there is a third tiny distal branch artery still contributing 2 bleeding at the diverticular site. The microcatheter was advanced into this third artery and additional coil embolization was performed. As before, a combination of low-profile penumbra detachable coils in 2 and 3 mm diameters were deployed. Final arteriography demonstrates cessation bleeding. The catheter system was removed. Hemostasis was attained with the assistance of a Celt arterial closure device. IMPRESSION: Successful embolization of distal branches of the right colic artery which were providing blood flow to the hemorrhaging ascending colonic diverticulum. No evidence of continued bleeding at the end of the procedure. Electronically  Signed   By: Malachy Moan M.D.   On: 07/04/2023 15:34   IR Angiogram Selective Each Additional Vessel  Result Date: 07/04/2023 INDICATION: 52 year old male on Coumadin with for a prior aortic valve replacement. 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 EXAM: IR EMBO ART VEN HEMORR LYMPH EXTRAV INC GUIDE ROADMAPPING; ADDITIONAL ARTERIOGRAPHY MEDICATIONS: None. ANESTHESIA/SEDATION: Moderate (conscious) sedation was employed during this procedure. A total of Versed 2 mg and Fentanyl 50 mcg was administered intravenously by radiology nursing. Moderate Sedation Time: 70 minutes. The patient's level of consciousness and vital signs were monitored continuously by radiology nursing throughout the procedure under my direct supervision. CONTRAST:  100 mL Omnipaque 300 FLUOROSCOPY: Radiation Exposure Index (as provided by the fluoroscopic device): 2,922 mGy Kerma COMPLICATIONS: None immediate. PROCEDURE: Informed consent was obtained from the patient following explanation of the procedure, risks, benefits and alternatives. The patient understands, agrees and consents for the procedure. All questions were addressed. A time out was performed prior to the initiation of the procedure. Maximal barrier sterile technique utilized including caps, mask, sterile gowns, sterile gloves, large sterile drape, hand hygiene, and Betadine prep. The right common femoral artery was interrogated with ultrasound and found to be widely patent. An image was obtained and stored for the medical record. Local anesthesia was attained by infiltration with 1% lidocaine. A small dermatotomy was made. Under real-time sonographic guidance, the vessel was punctured with a 21 gauge micropuncture needle. Using standard technique, the initial micro needle was exchanged over a 0.018 micro wire for a transitional 4 [place] micro sheath. The micro sheath was then exchanged over a 0.035 wire for a  5 French vascular sheath. A C2 cobra catheter was advanced over a Bentson wire and used to select the celiac artery. A celiac arteriogram was performed. No evidence of active bleeding. No evidence of replaced colic artery. The C2 cobra catheter was next advanced into the superior mesenteric artery. A arteriogram was performed. There is evidence of active bleeding arising from the terminal branch in the ascending colon as expected from the prior CTA. A Cook can tot a microcatheter was advanced into the ileocolic. An arteriogram was performed. The active bleeding branch does not appear to arise from the distal ileo colic artery. The microcatheter was next advanced into the right colic artery. Arteriography was performed. The hemorrhage is again visualized. Have appears to arise at the anti mesenteric border of the colon interval region of potentially several feeding distal branch arteries. The microcatheter was advanced into the first potential arterial feeder. Contrast injection confirms active hemorrhage. Coil embolization was performed utilizing a series of 1 and 2 mm low-profile penumbra detachable coils. The microcatheter was brought back. Contrast injection was performed. The embolized vessel is successfully embolized. However,  additional bleeding is occurring from a more cephalad branch. The microcatheter was successfully navigated into this branch. Contrast injection was performed confirming active bleeding. Coil embolization was then performed. During the coil embolization there was some extravasation of contrast likely from a coil protruding through the very small arterial wall. Additional coil embolization was performed with successful cessation of hemorrhage. Follow-up contrast injection again performed there is a third tiny distal branch artery still contributing 2 bleeding at the diverticular site. The microcatheter was advanced into this third artery and additional coil embolization was performed. As  before, a combination of low-profile penumbra detachable coils in 2 and 3 mm diameters were deployed. Final arteriography demonstrates cessation bleeding. The catheter system was removed. Hemostasis was attained with the assistance of a Celt arterial closure device. IMPRESSION: Successful embolization of distal branches of the right colic artery which were providing blood flow to the hemorrhaging ascending colonic diverticulum. No evidence of continued bleeding at the end of the procedure. Electronically Signed   By: Malachy Moan M.D.   On: 07/04/2023 15:34   IR Angiogram Selective Each Additional Vessel  Result Date: 07/04/2023 INDICATION: 52 year old male on Coumadin with for a prior aortic valve replacement. 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 EXAM: IR EMBO ART VEN HEMORR LYMPH EXTRAV INC GUIDE ROADMAPPING; ADDITIONAL ARTERIOGRAPHY MEDICATIONS: None. ANESTHESIA/SEDATION: Moderate (conscious) sedation was employed during this procedure. A total of Versed 2 mg and Fentanyl 50 mcg was administered intravenously by radiology nursing. Moderate Sedation Time: 70 minutes. The patient's level of consciousness and vital signs were monitored continuously by radiology nursing throughout the procedure under my direct supervision. CONTRAST:  100 mL Omnipaque 300 FLUOROSCOPY: Radiation Exposure Index (as provided by the fluoroscopic device): 2,922 mGy Kerma COMPLICATIONS: None immediate. PROCEDURE: Informed consent was obtained from the patient following explanation of the procedure, risks, benefits and alternatives. The patient understands, agrees and consents for the procedure. All questions were addressed. A time out was performed prior to the initiation of the procedure. Maximal barrier sterile technique utilized including caps, mask, sterile gowns, sterile gloves, large sterile drape, hand hygiene, and Betadine prep. The right common femoral  artery was interrogated with ultrasound and found to be widely patent. An image was obtained and stored for the medical record. Local anesthesia was attained by infiltration with 1% lidocaine. A small dermatotomy was made. Under real-time sonographic guidance, the vessel was punctured with a 21 gauge micropuncture needle. Using standard technique, the initial micro needle was exchanged over a 0.018 micro wire for a transitional 4 [place] micro sheath. The micro sheath was then exchanged over a 0.035 wire for a 5 French vascular sheath. A C2 cobra catheter was advanced over a Bentson wire and used to select the celiac artery. A celiac arteriogram was performed. No evidence of active bleeding. No evidence of replaced colic artery. The C2 cobra catheter was next advanced into the superior mesenteric artery. A arteriogram was performed. There is evidence of active bleeding arising from the terminal branch in the ascending colon as expected from the prior CTA. A Cook can tot a microcatheter was advanced into the ileocolic. An arteriogram was performed. The active bleeding branch does not appear to arise from the distal ileo colic artery. The microcatheter was next advanced into the right colic artery. Arteriography was performed. The hemorrhage is again visualized. Have appears to arise at the anti mesenteric border of the colon interval region of potentially several feeding distal  branch arteries. The microcatheter was advanced into the first potential arterial feeder. Contrast injection confirms active hemorrhage. Coil embolization was performed utilizing a series of 1 and 2 mm low-profile penumbra detachable coils. The microcatheter was brought back. Contrast injection was performed. The embolized vessel is successfully embolized. However, additional bleeding is occurring from a more cephalad branch. The microcatheter was successfully navigated into this branch. Contrast injection was performed confirming active  bleeding. Coil embolization was then performed. During the coil embolization there was some extravasation of contrast likely from a coil protruding through the very small arterial wall. Additional coil embolization was performed with successful cessation of hemorrhage. Follow-up contrast injection again performed there is a third tiny distal branch artery still contributing 2 bleeding at the diverticular site. The microcatheter was advanced into this third artery and additional coil embolization was performed. As before, a combination of low-profile penumbra detachable coils in 2 and 3 mm diameters were deployed. Final arteriography demonstrates cessation bleeding. The catheter system was removed. Hemostasis was attained with the assistance of a Celt arterial closure device. IMPRESSION: Successful embolization of distal branches of the right colic artery which were providing blood flow to the hemorrhaging ascending colonic diverticulum. No evidence of continued bleeding at the end of the procedure. Electronically Signed   By: Malachy Moan M.D.   On: 07/04/2023 15:34      Impression    52 year old male history of paroxysmal A-fib, aortic valve replacement July 2024 (on Coumadin), presented with new onset lower GI bleed and CTA positive for diverticular bleed s/p successful embolization with IR 9/15  Acute lower GI bleed s/p successful embolization with IR CTA 9/15 with acute bleed with and midportion of ascending colon from posterior diverticula Hgb 9.7 (10.3) BUN 15, creatinine 1.38 Vital signs stable No previous colonoscopy. Suspect his bleeding is improving with presence of old blood with todays dark bowel movement. However, patient will need colonoscopy at some point. With history of recent valve replacement on coumadin it may be safest to do a colonoscopy while he is inpatient so we can utilize a heparin bridge. INR would have to be below 2.0 for polypectomy. Or we can do colonoscopy for quick  evaluation and plan for possible polypectomy (at a later date) if necessary as an outpatient. Patient would like to think about it and decide  Aortic valve replacement July 2024 on Coumadin INR 2.7/PT 28.8 Holding in the setting of GI bleed, not on heparin    Plan   - Tentatively plan for colonoscopy Thursday 9/19 pending patient and cardiology recommendations - Continue daily PT/INR - Continue daily CBC and transfuse as needed to maintain HGB > 7   - Clear liquids at midnight - Cardiology consult for recommendation with recent aortic valve replacement  Thank you for your kind consultation, we will continue to follow.   Bayley Leanna Sato  07/06/2023, 2:52 PM    Attending Physician Note   I have taken a history, reviewed the chart and examined the patient. I performed a substantive portion of this encounter, including complete performance of at least one of the key components, in conjunction with the APP. I agree with the APP's note, impression and recommendations with my edits. My additional impressions and recommendations are as follows.   Acute ascending colon bleed resolving S/P IR coil embolization. Ascending colon, descending colon and sigmoid colon diverticulosis on CTA. No prior colonoscopy. Suspected diverticular bleed. R/O neoplasm, AVM, etc. Passing darker stools this afternoon. Notes mild RLQ tenderness to palpation,  possible related to embolization.  Colonoscopy (diagnostic if INR remains > 2.0) on Thursday as inpatient with IV heparin or in a few months as outpatient with anticipated need for anticoagulant bridging off Coumadin. I recommended inpatient colonoscopy Thursday and he favors this approach however he requests Cardiology input prior to his decision.  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 INR holding at 2.7. Coumadin on hold since 9/15.  Request Cardiology evaluation given recent aortic valve.    ABL  anemia. Hgb 9.7 today. Trend CBC.   Claudette Head, MD [facility] See [facility], [employer] [facility], for our on call provider

## 2023-07-06 NOTE — Progress Notes (Signed)
Supervising Physician: Marliss Coots  Patient Status:  [facility] - In-pt  Chief Complaint:  GI Bleed s/p embolization 07/04/23  Subjective:  Patient sitting upright in bed, comfortable at time of exam. He reports one mildly bloody bowel movement yesterday but states he has not had one today.  Allergies: Ibuprofen-acetaminophen  Medications: Prior to Admission medications   Medication Sig Start Date End Date Taking? Authorizing Provider  amiodarone (PACERONE) 200 MG tablet Amiodarone 400 mg (two tablets) twice daily for 1 week, THEN 200 twice daily until seen in office Patient taking differently: Take 200 mg by mouth 2 (two) times daily. 05/28/23  Yes Tobb, Kardie, DO  aspirin EC 81 MG tablet Take 1 tablet (81 mg total) by mouth daily. Swallow whole. 05/14/23  Yes Roddenberry, Cecille [place], PA-C  cyclobenzaprine (FLEXERIL) 5 MG tablet Take 5 mg by mouth 3 (three) times daily as needed for muscle spasms.   Yes [provider]  metoprolol succinate (TOPROL-XL) 25 MG 24 hr tablet Take 0.5 tablets (12.5 mg total) by mouth in the morning and at bedtime. 06/23/23  Yes Tobb, Kardie, DO  rosuvastatin (CRESTOR) 5 MG tablet Take 1 tablet (5 mg total) by mouth daily. Patient taking differently: Take 5 mg by mouth at bedtime. 09/23/22  Yes Masters, Katie, DO  sildenafil (VIAGRA) 25 MG tablet Take 100 mg by mouth daily as needed for erectile dysfunction.   Yes [provider]  warfarin (COUMADIN) 2.5 MG tablet Take 2 tablets (5 mg total) by mouth daily. Or as directed by the [facility] 05/14/23 05/13/24 Yes Roddenberry, Myron G, PA-C  furosemide (LASIX) 20 MG tablet Take 1 tablet (20 mg total) by mouth daily, for 3 days then call provider for further instructions Patient not taking: Reported on 07/04/2023 05/17/23   Tobb, Lavona Mound, DO     Vital Signs: BP 130/75 (BP Location: Left Arm)   Pulse 94   Temp 98.4 F (36.9 C) (Oral)   Resp 17   Ht 6' (1.829 m)   Wt 237 lb 14 oz (107.9 kg)    SpO2 99%   BMI 32.26 kg/m   Physical Exam Vitals reviewed.  Pulmonary:     Effort: Pulmonary effort is normal.  Musculoskeletal:     Comments: Right groin site soft, non-tender, no concern for hematoma or pseudoaneurysm  Neurological:     Mental Status: He is alert and oriented to person, place, and time.  Psychiatric:        Mood and Affect: Mood normal.        Behavior: Behavior normal.        Thought Content: Thought content normal.        Judgment: Judgment normal.     Imaging: IR US Guide Vasc Access Right  Result Date: 07/05/2023 INDICATION: 52 year old male on Coumadin with for a prior aortic valve replacement. 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 EXAM: IR EMBO ART VEN HEMORR LYMPH EXTRAV INC GUIDE ROADMAPPING; ADDITIONAL ARTERIOGRAPHY MEDICATIONS: None. ANESTHESIA/SEDATION: Moderate (conscious) sedation was employed during this procedure. A total of Versed 2 mg and Fentanyl 50 mcg was administered intravenously by radiology nursing. Moderate Sedation Time: 70 minutes. The patient's level of consciousness and vital signs were monitored continuously by radiology nursing throughout the procedure under my direct supervision. CONTRAST:  100 mL Omnipaque 300 FLUOROSCOPY: Radiation Exposure Index (as provided by the fluoroscopic device): 2,922 mGy Kerma COMPLICATIONS: None immediate. PROCEDURE: Informed consent was obtained  from the patient following explanation of the procedure, risks, benefits and alternatives. The patient understands, agrees and consents for the procedure. All questions were addressed. A time out was performed prior to the initiation of the procedure. Maximal barrier sterile technique utilized including caps, mask, sterile gowns, sterile gloves, large sterile drape, hand hygiene, and Betadine prep. The right common femoral artery was interrogated with ultrasound and found to be widely patent. An image was  obtained and stored for the medical record. Local anesthesia was attained by infiltration with 1% lidocaine. A small dermatotomy was made. Under real-time sonographic guidance, the vessel was punctured with a 21 gauge micropuncture needle. Using standard technique, the initial micro needle was exchanged over a 0.018 micro wire for a transitional 4 [place] micro sheath. The micro sheath was then exchanged over a 0.035 wire for a 5 French vascular sheath. A C2 cobra catheter was advanced over a Bentson wire and used to select the celiac artery. A celiac arteriogram was performed. No evidence of active bleeding. No evidence of replaced colic artery. The C2 cobra catheter was next advanced into the superior mesenteric artery. A arteriogram was performed. There is evidence of active bleeding arising from the terminal branch in the ascending colon as expected from the prior CTA. A Cook can tot a microcatheter was advanced into the ileocolic. An arteriogram was performed. The active bleeding branch does not appear to arise from the distal ileo colic artery. The microcatheter was next advanced into the right colic artery. Arteriography was performed. The hemorrhage is again visualized. Have appears to arise at the anti mesenteric border of the colon interval region of potentially several feeding distal branch arteries. The microcatheter was advanced into the first potential arterial feeder. Contrast injection confirms active hemorrhage. Coil embolization was performed utilizing a series of 1 and 2 mm low-profile penumbra detachable coils. The microcatheter was brought back. Contrast injection was performed. The embolized vessel is successfully embolized. However, additional bleeding is occurring from a more cephalad branch. The microcatheter was successfully navigated into this branch. Contrast injection was performed confirming active bleeding. Coil embolization was then performed. During the coil embolization there was  some extravasation of contrast likely from a coil protruding through the very small arterial wall. Additional coil embolization was performed with successful cessation of hemorrhage. Follow-up contrast injection again performed there is a third tiny distal branch artery still contributing 2 bleeding at the diverticular site. The microcatheter was advanced into this third artery and additional coil embolization was performed. As before, a combination of low-profile penumbra detachable coils in 2 and 3 mm diameters were deployed. Final arteriography demonstrates cessation bleeding. The catheter system was removed. Hemostasis was attained with the assistance of a Celt arterial closure device. IMPRESSION: Successful embolization of distal branches of the right colic artery which were providing blood flow to the hemorrhaging ascending colonic diverticulum. No evidence of continued bleeding at the end of the procedure. Electronically Signed   By: Malachy Moan M.D.   On: 07/05/2023 12:26   IR Angiogram Visceral Selective  Result Date: 07/05/2023 INDICATION: 52 year old male on Coumadin with for a prior aortic valve replacement. 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 EXAM: IR EMBO ART VEN HEMORR LYMPH EXTRAV INC GUIDE ROADMAPPING; ADDITIONAL ARTERIOGRAPHY MEDICATIONS: None. ANESTHESIA/SEDATION: Moderate (conscious) sedation was employed during this procedure. A total of Versed 2 mg and Fentanyl 50 mcg was administered intravenously by radiology nursing. Moderate Sedation Time: 70 minutes. The  patient's level of consciousness and vital signs were monitored continuously by radiology nursing throughout the procedure under my direct supervision. CONTRAST:  100 mL Omnipaque 300 FLUOROSCOPY: Radiation Exposure Index (as provided by the fluoroscopic device): 2,922 mGy Kerma COMPLICATIONS: None immediate. PROCEDURE: Informed consent was obtained from the  patient following explanation of the procedure, risks, benefits and alternatives. The patient understands, agrees and consents for the procedure. All questions were addressed. A time out was performed prior to the initiation of the procedure. Maximal barrier sterile technique utilized including caps, mask, sterile gowns, sterile gloves, large sterile drape, hand hygiene, and Betadine prep. The right common femoral artery was interrogated with ultrasound and found to be widely patent. An image was obtained and stored for the medical record. Local anesthesia was attained by infiltration with 1% lidocaine. A small dermatotomy was made. Under real-time sonographic guidance, the vessel was punctured with a 21 gauge micropuncture needle. Using standard technique, the initial micro needle was exchanged over a 0.018 micro wire for a transitional 4 [place] micro sheath. The micro sheath was then exchanged over a 0.035 wire for a 5 French vascular sheath. A C2 cobra catheter was advanced over a Bentson wire and used to select the celiac artery. A celiac arteriogram was performed. No evidence of active bleeding. No evidence of replaced colic artery. The C2 cobra catheter was next advanced into the superior mesenteric artery. A arteriogram was performed. There is evidence of active bleeding arising from the terminal branch in the ascending colon as expected from the prior CTA. A Cook can tot a microcatheter was advanced into the ileocolic. An arteriogram was performed. The active bleeding branch does not appear to arise from the distal ileo colic artery. The microcatheter was next advanced into the right colic artery. Arteriography was performed. The hemorrhage is again visualized. Have appears to arise at the anti mesenteric border of the colon interval region of potentially several feeding distal branch arteries. The microcatheter was advanced into the first potential arterial feeder. Contrast injection confirms active  hemorrhage. Coil embolization was performed utilizing a series of 1 and 2 mm low-profile penumbra detachable coils. The microcatheter was brought back. Contrast injection was performed. The embolized vessel is successfully embolized. However, additional bleeding is occurring from a more cephalad branch. The microcatheter was successfully navigated into this branch. Contrast injection was performed confirming active bleeding. Coil embolization was then performed. During the coil embolization there was some extravasation of contrast likely from a coil protruding through the very small arterial wall. Additional coil embolization was performed with successful cessation of hemorrhage. Follow-up contrast injection again performed there is a third tiny distal branch artery still contributing 2 bleeding at the diverticular site. The microcatheter was advanced into this third artery and additional coil embolization was performed. As before, a combination of low-profile penumbra detachable coils in 2 and 3 mm diameters were deployed. Final arteriography demonstrates cessation bleeding. The catheter system was removed. Hemostasis was attained with the assistance of a Celt arterial closure device. IMPRESSION: Successful embolization of distal branches of the right colic artery which were providing blood flow to the hemorrhaging ascending colonic diverticulum. No evidence of continued bleeding at the end of the procedure. Electronically Signed   By: Malachy Moan M.D.   On: 07/05/2023 12:26   IR Angiogram Visceral Selective  Result Date: 07/05/2023 INDICATION: 52 year old male on Coumadin with for a prior aortic valve replacement. 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 EXAM: IR EMBO ART VEN HEMORR LYMPH EXTRAV INC GUIDE ROADMAPPING; ADDITIONAL ARTERIOGRAPHY MEDICATIONS: None. ANESTHESIA/SEDATION: Moderate (conscious) sedation was employed during this  procedure. A total of Versed 2 mg and Fentanyl 50 mcg was administered intravenously by radiology nursing. Moderate Sedation Time: 70 minutes. The patient's level of consciousness and vital signs were monitored continuously by radiology nursing throughout the procedure under my direct supervision. CONTRAST:  100 mL Omnipaque 300 FLUOROSCOPY: Radiation Exposure Index (as provided by the fluoroscopic device): 2,922 mGy Kerma COMPLICATIONS: None immediate. PROCEDURE: Informed consent was obtained from the patient following explanation of the procedure, risks, benefits and alternatives. The patient understands, agrees and consents for the procedure. All questions were addressed. A time out was performed prior to the initiation of the procedure. Maximal barrier sterile technique utilized including caps, mask, sterile gowns, sterile gloves, large sterile drape, hand hygiene, and Betadine prep. The right common femoral artery was interrogated with ultrasound and found to be widely patent. An image was obtained and stored for the medical record. Local anesthesia was attained by infiltration with 1% lidocaine. A small dermatotomy was made. Under real-time sonographic guidance, the vessel was punctured with a 21 gauge micropuncture needle. Using standard technique, the initial micro needle was exchanged over a 0.018 micro wire for a transitional 4 [place] micro sheath. The micro sheath was then exchanged over a 0.035 wire for a 5 French vascular sheath. A C2 cobra catheter was advanced over a Bentson wire and used to select the celiac artery. A celiac arteriogram was performed. No evidence of active bleeding. No evidence of replaced colic artery. The C2 cobra catheter was next advanced into the superior mesenteric artery. A arteriogram was performed. There is evidence of active bleeding arising from the terminal branch in the ascending colon as expected from the prior CTA. A Cook can tot a microcatheter was advanced into the  ileocolic. An arteriogram was performed. The active bleeding branch does not appear to arise from the distal ileo colic artery. The microcatheter was next advanced into the right colic artery. Arteriography was performed. The hemorrhage is again visualized. Have appears to arise at the anti mesenteric border of the colon interval region of potentially several feeding distal branch arteries. The microcatheter was advanced into the first potential arterial feeder. Contrast injection confirms active hemorrhage. Coil embolization was performed utilizing a series of 1 and 2 mm low-profile penumbra detachable coils. The microcatheter was brought back. Contrast injection was performed. The embolized vessel is successfully embolized. However, additional bleeding is occurring from a more cephalad branch. The microcatheter was successfully navigated into this branch. Contrast injection was performed confirming active bleeding. Coil embolization was then performed. During the coil embolization there was some extravasation of contrast likely from a coil protruding through the very small arterial wall. Additional coil embolization was performed with successful cessation of hemorrhage. Follow-up contrast injection again performed there is a third tiny distal branch artery still contributing 2 bleeding at the diverticular site. The microcatheter was advanced into this third artery and additional coil embolization was performed. As before, a combination of low-profile penumbra detachable coils in 2 and 3 mm diameters were deployed. Final arteriography demonstrates cessation bleeding. The catheter system was removed. Hemostasis was attained with the assistance of a Celt arterial closure device. IMPRESSION: Successful embolization of distal branches of the right colic artery which were providing blood flow to the hemorrhaging ascending colonic diverticulum. No evidence of continued bleeding at the end of the procedure. Electronically  Signed  By: Malachy Moan M.D.   On: 07/05/2023 12:26   IR Angiogram Selective Each Additional Vessel  Result Date: 07/05/2023 INDICATION: 52 year old male on Coumadin with for a prior aortic valve replacement. 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 EXAM: IR EMBO ART VEN HEMORR LYMPH EXTRAV INC GUIDE ROADMAPPING; ADDITIONAL ARTERIOGRAPHY MEDICATIONS: None. ANESTHESIA/SEDATION: Moderate (conscious) sedation was employed during this procedure. A total of Versed 2 mg and Fentanyl 50 mcg was administered intravenously by radiology nursing. Moderate Sedation Time: 70 minutes. The patient's level of consciousness and vital signs were monitored continuously by radiology nursing throughout the procedure under my direct supervision. CONTRAST:  100 mL Omnipaque 300 FLUOROSCOPY: Radiation Exposure Index (as provided by the fluoroscopic device): 2,922 mGy Kerma COMPLICATIONS: None immediate. PROCEDURE: Informed consent was obtained from the patient following explanation of the procedure, risks, benefits and alternatives. The patient understands, agrees and consents for the procedure. All questions were addressed. A time out was performed prior to the initiation of the procedure. Maximal barrier sterile technique utilized including caps, mask, sterile gowns, sterile gloves, large sterile drape, hand hygiene, and Betadine prep. The right common femoral artery was interrogated with ultrasound and found to be widely patent. An image was obtained and stored for the medical record. Local anesthesia was attained by infiltration with 1% lidocaine. A small dermatotomy was made. Under real-time sonographic guidance, the vessel was punctured with a 21 gauge micropuncture needle. Using standard technique, the initial micro needle was exchanged over a 0.018 micro wire for a transitional 4 [place] micro sheath. The micro sheath was then exchanged over a 0.035 wire for a  5 French vascular sheath. A C2 cobra catheter was advanced over a Bentson wire and used to select the celiac artery. A celiac arteriogram was performed. No evidence of active bleeding. No evidence of replaced colic artery. The C2 cobra catheter was next advanced into the superior mesenteric artery. A arteriogram was performed. There is evidence of active bleeding arising from the terminal branch in the ascending colon as expected from the prior CTA. A Cook can tot a microcatheter was advanced into the ileocolic. An arteriogram was performed. The active bleeding branch does not appear to arise from the distal ileo colic artery. The microcatheter was next advanced into the right colic artery. Arteriography was performed. The hemorrhage is again visualized. Have appears to arise at the anti mesenteric border of the colon interval region of potentially several feeding distal branch arteries. The microcatheter was advanced into the first potential arterial feeder. Contrast injection confirms active hemorrhage. Coil embolization was performed utilizing a series of 1 and 2 mm low-profile penumbra detachable coils. The microcatheter was brought back. Contrast injection was performed. The embolized vessel is successfully embolized. However, additional bleeding is occurring from a more cephalad branch. The microcatheter was successfully navigated into this branch. Contrast injection was performed confirming active bleeding. Coil embolization was then performed. During the coil embolization there was some extravasation of contrast likely from a coil protruding through the very small arterial wall. Additional coil embolization was performed with successful cessation of hemorrhage. Follow-up contrast injection again performed there is a third tiny distal branch artery still contributing 2 bleeding at the diverticular site. The microcatheter was advanced into this third artery and additional coil embolization was performed. As  before, a combination of low-profile penumbra detachable coils in 2 and 3 mm diameters were deployed. Final arteriography demonstrates cessation bleeding. The catheter system was removed.  Hemostasis was attained with the assistance of a Celt arterial closure device. IMPRESSION: Successful embolization of distal branches of the right colic artery which were providing blood flow to the hemorrhaging ascending colonic diverticulum. No evidence of continued bleeding at the end of the procedure. Electronically Signed   By: Malachy Moan M.D.   On: 07/05/2023 12:26   IR EMBO ART  VEN HEMORR LYMPH EXTRAV  INC GUIDE ROADMAPPING  Result Date: 07/04/2023 INDICATION: 52 year old male on Coumadin with for a prior aortic valve replacement. 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 EXAM: IR EMBO ART VEN HEMORR LYMPH EXTRAV INC GUIDE ROADMAPPING; ADDITIONAL ARTERIOGRAPHY MEDICATIONS: None. ANESTHESIA/SEDATION: Moderate (conscious) sedation was employed during this procedure. A total of Versed 2 mg and Fentanyl 50 mcg was administered intravenously by radiology nursing. Moderate Sedation Time: 70 minutes. The patient's level of consciousness and vital signs were monitored continuously by radiology nursing throughout the procedure under my direct supervision. CONTRAST:  100 mL Omnipaque 300 FLUOROSCOPY: Radiation Exposure Index (as provided by the fluoroscopic device): 2,922 mGy Kerma COMPLICATIONS: None immediate. PROCEDURE: Informed consent was obtained from the patient following explanation of the procedure, risks, benefits and alternatives. The patient understands, agrees and consents for the procedure. All questions were addressed. A time out was performed prior to the initiation of the procedure. Maximal barrier sterile technique utilized including caps, mask, sterile gowns, sterile gloves, large sterile drape, hand hygiene, and Betadine prep. The right  common femoral artery was interrogated with ultrasound and found to be widely patent. An image was obtained and stored for the medical record. Local anesthesia was attained by infiltration with 1% lidocaine. A small dermatotomy was made. Under real-time sonographic guidance, the vessel was punctured with a 21 gauge micropuncture needle. Using standard technique, the initial micro needle was exchanged over a 0.018 micro wire for a transitional 4 [place] micro sheath. The micro sheath was then exchanged over a 0.035 wire for a 5 French vascular sheath. A C2 cobra catheter was advanced over a Bentson wire and used to select the celiac artery. A celiac arteriogram was performed. No evidence of active bleeding. No evidence of replaced colic artery. The C2 cobra catheter was next advanced into the superior mesenteric artery. A arteriogram was performed. There is evidence of active bleeding arising from the terminal branch in the ascending colon as expected from the prior CTA. A Cook can tot a microcatheter was advanced into the ileocolic. An arteriogram was performed. The active bleeding branch does not appear to arise from the distal ileo colic artery. The microcatheter was next advanced into the right colic artery. Arteriography was performed. The hemorrhage is again visualized. Have appears to arise at the anti mesenteric border of the colon interval region of potentially several feeding distal branch arteries. The microcatheter was advanced into the first potential arterial feeder. Contrast injection confirms active hemorrhage. Coil embolization was performed utilizing a series of 1 and 2 mm low-profile penumbra detachable coils. The microcatheter was brought back. Contrast injection was performed. The embolized vessel is successfully embolized. However, additional bleeding is occurring from a more cephalad branch. The microcatheter was successfully navigated into this branch. Contrast injection was performed  confirming active bleeding. Coil embolization was then performed. During the coil embolization there was some extravasation of contrast likely from a coil protruding through the very small arterial wall. Additional coil embolization was performed with successful cessation of hemorrhage. Follow-up contrast injection again performed there is  a third tiny distal branch artery still contributing 2 bleeding at the diverticular site. The microcatheter was advanced into this third artery and additional coil embolization was performed. As before, a combination of low-profile penumbra detachable coils in 2 and 3 mm diameters were deployed. Final arteriography demonstrates cessation bleeding. The catheter system was removed. Hemostasis was attained with the assistance of a Celt arterial closure device. IMPRESSION: Successful embolization of distal branches of the right colic artery which were providing blood flow to the hemorrhaging ascending colonic diverticulum. No evidence of continued bleeding at the end of the procedure. Electronically Signed   By: Malachy Moan M.D.   On: 07/04/2023 15:34   IR Angiogram Selective Each Additional Vessel  Result Date: 07/04/2023 INDICATION: 52 year old male on Coumadin with for a prior aortic valve replacement. 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 EXAM: IR EMBO ART VEN HEMORR LYMPH EXTRAV INC GUIDE ROADMAPPING; ADDITIONAL ARTERIOGRAPHY MEDICATIONS: None. ANESTHESIA/SEDATION: Moderate (conscious) sedation was employed during this procedure. A total of Versed 2 mg and Fentanyl 50 mcg was administered intravenously by radiology nursing. Moderate Sedation Time: 70 minutes. The patient's level of consciousness and vital signs were monitored continuously by radiology nursing throughout the procedure under my direct supervision. CONTRAST:  100 mL Omnipaque 300 FLUOROSCOPY: Radiation Exposure Index (as provided by the  fluoroscopic device): 2,922 mGy Kerma COMPLICATIONS: None immediate. PROCEDURE: Informed consent was obtained from the patient following explanation of the procedure, risks, benefits and alternatives. The patient understands, agrees and consents for the procedure. All questions were addressed. A time out was performed prior to the initiation of the procedure. Maximal barrier sterile technique utilized including caps, mask, sterile gowns, sterile gloves, large sterile drape, hand hygiene, and Betadine prep. The right common femoral artery was interrogated with ultrasound and found to be widely patent. An image was obtained and stored for the medical record. Local anesthesia was attained by infiltration with 1% lidocaine. A small dermatotomy was made. Under real-time sonographic guidance, the vessel was punctured with a 21 gauge micropuncture needle. Using standard technique, the initial micro needle was exchanged over a 0.018 micro wire for a transitional 4 [place] micro sheath. The micro sheath was then exchanged over a 0.035 wire for a 5 French vascular sheath. A C2 cobra catheter was advanced over a Bentson wire and used to select the celiac artery. A celiac arteriogram was performed. No evidence of active bleeding. No evidence of replaced colic artery. The C2 cobra catheter was next advanced into the superior mesenteric artery. A arteriogram was performed. There is evidence of active bleeding arising from the terminal branch in the ascending colon as expected from the prior CTA. A Cook can tot a microcatheter was advanced into the ileocolic. An arteriogram was performed. The active bleeding branch does not appear to arise from the distal ileo colic artery. The microcatheter was next advanced into the right colic artery. Arteriography was performed. The hemorrhage is again visualized. Have appears to arise at the anti mesenteric border of the colon interval region of potentially several feeding distal branch  arteries. The microcatheter was advanced into the first potential arterial feeder. Contrast injection confirms active hemorrhage. Coil embolization was performed utilizing a series of 1 and 2 mm low-profile penumbra detachable coils. The microcatheter was brought back. Contrast injection was performed. The embolized vessel is successfully embolized. However, additional bleeding is occurring from a more cephalad branch. The microcatheter was successfully navigated into this branch. Contrast  injection was performed confirming active bleeding. Coil embolization was then performed. During the coil embolization there was some extravasation of contrast likely from a coil protruding through the very small arterial wall. Additional coil embolization was performed with successful cessation of hemorrhage. Follow-up contrast injection again performed there is a third tiny distal branch artery still contributing 2 bleeding at the diverticular site. The microcatheter was advanced into this third artery and additional coil embolization was performed. As before, a combination of low-profile penumbra detachable coils in 2 and 3 mm diameters were deployed. Final arteriography demonstrates cessation bleeding. The catheter system was removed. Hemostasis was attained with the assistance of a Celt arterial closure device. IMPRESSION: Successful embolization of distal branches of the right colic artery which were providing blood flow to the hemorrhaging ascending colonic diverticulum. No evidence of continued bleeding at the end of the procedure. Electronically Signed   By: Malachy Moan M.D.   On: 07/04/2023 15:34   IR Angiogram Selective Each Additional Vessel  Result Date: 07/04/2023 INDICATION: 52 year old male on Coumadin with for a prior aortic valve replacement. 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 EXAM: IR EMBO ART VEN HEMORR LYMPH EXTRAV INC GUIDE  ROADMAPPING; ADDITIONAL ARTERIOGRAPHY MEDICATIONS: None. ANESTHESIA/SEDATION: Moderate (conscious) sedation was employed during this procedure. A total of Versed 2 mg and Fentanyl 50 mcg was administered intravenously by radiology nursing. Moderate Sedation Time: 70 minutes. The patient's level of consciousness and vital signs were monitored continuously by radiology nursing throughout the procedure under my direct supervision. CONTRAST:  100 mL Omnipaque 300 FLUOROSCOPY: Radiation Exposure Index (as provided by the fluoroscopic device): 2,922 mGy Kerma COMPLICATIONS: None immediate. PROCEDURE: Informed consent was obtained from the patient following explanation of the procedure, risks, benefits and alternatives. The patient understands, agrees and consents for the procedure. All questions were addressed. A time out was performed prior to the initiation of the procedure. Maximal barrier sterile technique utilized including caps, mask, sterile gowns, sterile gloves, large sterile drape, hand hygiene, and Betadine prep. The right common femoral artery was interrogated with ultrasound and found to be widely patent. An image was obtained and stored for the medical record. Local anesthesia was attained by infiltration with 1% lidocaine. A small dermatotomy was made. Under real-time sonographic guidance, the vessel was punctured with a 21 gauge micropuncture needle. Using standard technique, the initial micro needle was exchanged over a 0.018 micro wire for a transitional 4 [place] micro sheath. The micro sheath was then exchanged over a 0.035 wire for a 5 French vascular sheath. A C2 cobra catheter was advanced over a Bentson wire and used to select the celiac artery. A celiac arteriogram was performed. No evidence of active bleeding. No evidence of replaced colic artery. The C2 cobra catheter was next advanced into the superior mesenteric artery. A arteriogram was performed. There is evidence of active bleeding arising  from the terminal branch in the ascending colon as expected from the prior CTA. A Cook can tot a microcatheter was advanced into the ileocolic. An arteriogram was performed. The active bleeding branch does not appear to arise from the distal ileo colic artery. The microcatheter was next advanced into the right colic artery. Arteriography was performed. The hemorrhage is again visualized. Have appears to arise at the anti mesenteric border of the colon interval region of potentially several feeding distal branch arteries. The microcatheter was advanced into the first potential arterial feeder. Contrast injection confirms active hemorrhage. Coil  embolization was performed utilizing a series of 1 and 2 mm low-profile penumbra detachable coils. The microcatheter was brought back. Contrast injection was performed. The embolized vessel is successfully embolized. However, additional bleeding is occurring from a more cephalad branch. The microcatheter was successfully navigated into this branch. Contrast injection was performed confirming active bleeding. Coil embolization was then performed. During the coil embolization there was some extravasation of contrast likely from a coil protruding through the very small arterial wall. Additional coil embolization was performed with successful cessation of hemorrhage. Follow-up contrast injection again performed there is a third tiny distal branch artery still contributing 2 bleeding at the diverticular site. The microcatheter was advanced into this third artery and additional coil embolization was performed. As before, a combination of low-profile penumbra detachable coils in 2 and 3 mm diameters were deployed. Final arteriography demonstrates cessation bleeding. The catheter system was removed. Hemostasis was attained with the assistance of a Celt arterial closure device. IMPRESSION: Successful embolization of distal branches of the right colic artery which were providing blood  flow to the hemorrhaging ascending colonic diverticulum. No evidence of continued bleeding at the end of the procedure. Electronically Signed   By: Malachy Moan M.D.   On: 07/04/2023 15:34   IR Angiogram Selective Each Additional Vessel  Result Date: 07/04/2023 INDICATION: 52 year old male on Coumadin with for a prior aortic valve replacement. 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 EXAM: IR EMBO ART VEN HEMORR LYMPH EXTRAV INC GUIDE ROADMAPPING; ADDITIONAL ARTERIOGRAPHY MEDICATIONS: None. ANESTHESIA/SEDATION: Moderate (conscious) sedation was employed during this procedure. A total of Versed 2 mg and Fentanyl 50 mcg was administered intravenously by radiology nursing. Moderate Sedation Time: 70 minutes. The patient's level of consciousness and vital signs were monitored continuously by radiology nursing throughout the procedure under my direct supervision. CONTRAST:  100 mL Omnipaque 300 FLUOROSCOPY: Radiation Exposure Index (as provided by the fluoroscopic device): 2,922 mGy Kerma COMPLICATIONS: None immediate. PROCEDURE: Informed consent was obtained from the patient following explanation of the procedure, risks, benefits and alternatives. The patient understands, agrees and consents for the procedure. All questions were addressed. A time out was performed prior to the initiation of the procedure. Maximal barrier sterile technique utilized including caps, mask, sterile gowns, sterile gloves, large sterile drape, hand hygiene, and Betadine prep. The right common femoral artery was interrogated with ultrasound and found to be widely patent. An image was obtained and stored for the medical record. Local anesthesia was attained by infiltration with 1% lidocaine. A small dermatotomy was made. Under real-time sonographic guidance, the vessel was punctured with a 21 gauge micropuncture needle. Using standard technique, the initial micro needle was  exchanged over a 0.018 micro wire for a transitional 4 [place] micro sheath. The micro sheath was then exchanged over a 0.035 wire for a 5 French vascular sheath. A C2 cobra catheter was advanced over a Bentson wire and used to select the celiac artery. A celiac arteriogram was performed. No evidence of active bleeding. No evidence of replaced colic artery. The C2 cobra catheter was next advanced into the superior mesenteric artery. A arteriogram was performed. There is evidence of active bleeding arising from the terminal branch in the ascending colon as expected from the prior CTA. A Cook can tot a microcatheter was advanced into the ileocolic. An arteriogram was performed. The active bleeding branch does not appear to arise from the distal ileo colic artery. The microcatheter was next advanced into  the right colic artery. Arteriography was performed. The hemorrhage is again visualized. Have appears to arise at the anti mesenteric border of the colon interval region of potentially several feeding distal branch arteries. The microcatheter was advanced into the first potential arterial feeder. Contrast injection confirms active hemorrhage. Coil embolization was performed utilizing a series of 1 and 2 mm low-profile penumbra detachable coils. The microcatheter was brought back. Contrast injection was performed. The embolized vessel is successfully embolized. However, additional bleeding is occurring from a more cephalad branch. The microcatheter was successfully navigated into this branch. Contrast injection was performed confirming active bleeding. Coil embolization was then performed. During the coil embolization there was some extravasation of contrast likely from a coil protruding through the very small arterial wall. Additional coil embolization was performed with successful cessation of hemorrhage. Follow-up contrast injection again performed there is a third tiny distal branch artery still contributing 2  bleeding at the diverticular site. The microcatheter was advanced into this third artery and additional coil embolization was performed. As before, a combination of low-profile penumbra detachable coils in 2 and 3 mm diameters were deployed. Final arteriography demonstrates cessation bleeding. The catheter system was removed. Hemostasis was attained with the assistance of a Celt arterial closure device. IMPRESSION: Successful embolization of distal branches of the right colic artery which were providing blood flow to the hemorrhaging ascending colonic diverticulum. No evidence of continued bleeding at the end of the procedure. Electronically Signed   By: Malachy Moan M.D.   On: 07/04/2023 15:34   CT ANGIO GI BLEED  Result Date: 07/04/2023 CLINICAL DATA:  Bloody stools since this morning. Patient on Eliquis. Abdominal aortic aneurysm, postop. EXAM: CTA ABDOMEN AND PELVIS WITHOUT AND WITH CONTRAST TECHNIQUE: Multidetector CT imaging of the abdomen and pelvis was performed using the standard protocol during bolus administration of intravenous contrast. Multiplanar reconstructed images and MIPs were obtained and reviewed to evaluate the vascular anatomy. RADIATION DOSE REDUCTION: This exam was performed according to the departmental dose-optimization program which includes automated exposure control, adjustment of the mA and/or kV according to patient size and/or use of iterative reconstruction technique. CONTRAST:  OMNIPAQUE IOHEXOL 350 MG/ML SOLN COMPARISON:  03/21/2023 FINDINGS: VASCULAR Aorta: Normal caliber aorta without aneurysm, dissection, vasculitis or significant stenosis. Celiac: Patent without evidence of aneurysm, dissection, vasculitis or significant stenosis. SMA: Patent without evidence of aneurysm, dissection, vasculitis or significant stenosis. Renals: Both renal arteries are patent without evidence of aneurysm, dissection, vasculitis, fibromuscular dysplasia or significant stenosis.  IMA: Patent without evidence of aneurysm, dissection, vasculitis or significant stenosis. Inflow: Patent without evidence of aneurysm, dissection, vasculitis or significant stenosis. Proximal Outflow: Bilateral common femoral and visualized portions of the superficial and profunda femoral arteries are patent without evidence of aneurysm, dissection, vasculitis or significant stenosis. Veins: No obvious venous abnormality within the limitations of this arterial phase study. Review of the MIP images confirms the above findings. NON-VASCULAR Lower chest: Minimal linear atelectasis/scarring in the lung bases. Median sternotomy wires are present. Partially visualized hardware over the aortic root. Hepatobiliary: Previous cholecystectomy. Subcentimeter cyst over the dome of the liver. Biliary tree is normal. Pancreas: Normal. Spleen: Previous splenectomy. Several small splenic remnants over the left lateral abdomen unchanged. Adrenals/Urinary Tract: Adrenal glands are normal. Kidneys are normal in size without hydronephrosis or nephrolithiasis. Ureters and bladder are unremarkable. Stomach/Bowel: Surgical clips over the proximal stomach. Small bowel is within normal. Appendix is normal. There is significant diverticulosis over the ascending colon as well as the distal descending  and sigmoid colon. No definite diverticulitis. The arterial phase images demonstrate hyperdense material within the midportion of the ascending colon with mild pooling within the dependent portion of the ascending colon as this appears to be originating from a posterior diverticula compatible with acute bleed. The venous phase images again demonstrate this hyperdense material which is more dense and concentrated along the anterior aspect of the ascending colon at the base of several additional diverticula. No evidence of perforation/free peritoneal air. Lymphatic: No adenopathy. Reproductive: Normal. Other: Postsurgical change of the anterior  abdominal wall likely from previous hernia repair. Musculoskeletal: Old pelvic fractures. Mild degenerative change of the hips and spine. IMPRESSION: 1. Acute bleed within the midportion of the ascending colon likely originating from a posterior diverticula, although more concentrated adjacent the base of an anterior diverticula in this same region on the delayed images. No evidence of perforation/free peritoneal air. 2. Significant diverticulosis over the ascending colon as well as the distal descending and sigmoid colon. No acute diverticulitis. 3. Previous cholecystectomy and splenectomy. Subcentimeter hepatic cyst. Critical Value/emergent results were called by telephone at the time of interpretation on 07/04/2023 at 1:07 pm to provider Lauren Roemildt, who verbally acknowledged these results. Electronically Signed   By: Elberta Fortis M.D.   On: 07/04/2023 13:09    Labs:  CBC: Recent Labs    05/29/23 1655 07/04/23 1130 07/04/23 2152 07/05/23 0317 07/05/23 0739 07/05/23 1031 07/06/23 0310  WBC 6.1 4.3  --   --  12.3*  --  13.9*  HGB 13.2 13.3   < > 11.0* 10.4* 10.3* 9.7*  HCT 40.5 41.2   < > 32.8* 31.6* 30.8* 29.5*  PLT 540* 319  --   --  224  --  234   < > = values in this interval not displayed.    COAGS: Recent Labs    05/04/23 1115 05/06/23 1315 05/07/23 1450 07/05/23 0317 07/05/23 0739 07/05/23 1844 07/06/23 0310  INR 1.0 1.6*   < > 2.7* 2.6* 2.6* 2.7*  APTT 28 34  --   --   --   --   --    < > = values in this interval not displayed.    BMP: Recent Labs    07/04/23 1130 07/05/23 0317 07/05/23 0739 07/06/23 0310  NA 134* 134* 133* 133*  K 3.7 4.2 4.0 3.9  CL 104 105 105 101  CO2 23 22 22 25   GLUCOSE 126* 127* 113* 103*  BUN 20 18 20 15   CALCIUM 8.4* 7.9* 7.8* 7.9*  CREATININE 1.53* 1.31* 1.48* 1.38*  GFRNONAA 55* >60 57* >60    LIVER FUNCTION TESTS: Recent Labs    05/04/23 1115 05/07/23 0155 05/28/23 1135 07/04/23 1130  BILITOT 0.7 1.2 0.4 0.7  AST  20 52* 27 17  ALT 39 26 35 24  ALKPHOS 56 37* 101 56  PROT 7.1 5.4* 6.8 6.6  ALBUMIN 3.7 3.3* 3.8 3.6    Assessment and Plan:  Acute GI bleed s/p embolization by Dr Archer Asa 07/04/23 -patient tolerated procedure well -Hgb 9.7 (10.3). Single bloody bowel movement reported since embolization -No concern for pseudoaneurysm or hematoma at right groin puncture site -No need for additional IR intervention at this time -IR to follow remotely. Please contact IR team with any questions or concerns  Electronically Signed: Kennieth Francois, PA-C 07/06/2023, 1:47 PM   I spent a total of 15 Minutes at the the patient's bedside AND on the patient's hospital floor or unit, greater than 50%  of which was counseling/coordinating care for GI bleed s/p embolization

## 2023-07-06 NOTE — Progress Notes (Signed)
PROGRESS NOTE    Timothy Browning  ZOX:[id number] DOB: 1971-05-07 DOA: 07/04/2023 PCP: Marrianne Mood, MD   Brief Narrative: ARSHAN VILLAREAL is a 52 y.o. male with a history of hypertension, CKD stage IIIa, paroxysmal atrial fibrillation, aortic aneurysm status post Bentall procedure and status post aortic valve replacement with mechanical valve on Coumadin.  Patient presented secondary to rectal bleeding and lightheadedness and was found to have evidence of hematochezia with hypotension with concern for lower GI bleeding.  CT angio GI bleed protocol was performed and was significant for an acute bleed located in the ascending colon secondary to diverticular bleeding.  IR was consulted and performed emergent angiogram with successful localization and embolization of ascending colonic diverticula.  GI consulted.   Assessment and Plan:  Acute diverticular bleeding Significant bleeding noted on admission. CT angio GI bleed protocol performed and was significant for identification of acute bleed within the midportion of the ascending colon likely originating from a posterior diverticula. IR was consulted and performed emergent angiogram with successful localization and embolization of ascending colonic diverticular bleed. One episode of hematochezia on 9/16. -GI consulted -Monitor for signs of colonic ischemia  Acute blood loss anemia Secondary to diverticular bleeding. Hemoglobin of 13.3 d/dL on admission. Patient was transfused a total of 1 unit of PRBC secondary to hematochezia with associated hypotension. Hemoglobin of 9.7 g/dL today. INR stable. -CBC daily  Hypotension Likely related to acute blood/fluid losses. Patient was given fluid and blood for resuscitation. -Orthostatic vitals  Fever Unclear etiology. Asymptomatic. Associated elevated WBC. -Check procalcitonin -Monitor for symptoms  Paroxysmal atrial fibrillation Currently in sinus rhythm. Patient is on Toprol-XL and  coumadin. Toprol XL held secondary to hypotension. -Resume home Toprol XL once blood pressure remains stable.  S/p mechanical aortic valve Valve recently placed in 04/2023. Patient is on coumadin with goal INR of 2.5 to 3.5. Coumadin held secondary to GI bleeding. -Heparin IV once INR reaches below 2.5 -Resume coumadin once ongoing GI bleeding unlikely  CKD stage IIIa Stable.  OSA -Continue CPAP qhs  DVT prophylaxis: Heparin IV once INR less than 2.5, SCDs Code Status:   Code Status: Full Code Family Communication: Wife at bedside Disposition Plan: Transfer to progressive level of care. Discharge home likely in 2-3 days pending stable hemoglobin and blood pressure, in addition to stable hemoglobin,  transition to Coumadin and GI recommendations   Consultants:  Gastroenterology (Drs. Nicholes Mango)  Procedures:  9/16: Successful localization and embolization of ascending colonic diverticular bleed  Antimicrobials: None    Subjective: One episode of blood stool yesterday afternoon. Passing gas but no recurrent bowel movement. Fever overnight without symptoms.  Objective: BP 139/74 (BP Location: Left Arm)   Pulse 93   Temp 98.4 F (36.9 C) (Oral)   Resp (!) 24   Ht 6' (1.829 m)   Wt 107.9 kg   SpO2 99%   BMI 32.26 kg/m   Examination:  General exam: Appears calm and comfortable Respiratory system: Clear to auscultation. Respiratory effort normal. Cardiovascular system: S1 & S2 heard, RRR. Mechanical click Gastrointestinal system: Abdomen is nondistended, soft and nontender. Normal bowel sounds heard. Central nervous system: Alert and oriented. No focal neurological deficits. Musculoskeletal: No edema. No calf tenderness Skin: No cyanosis. No rashes Psychiatry: Judgement and insight appear normal. Mood & affect appropriate.     Data Reviewed: I have personally reviewed following labs and imaging studies  CBC Lab Results  Component Value Date   WBC 13.9 (H)  07/06/2023  RBC 3.13 (L) 07/06/2023   HGB 9.7 (L) 07/06/2023   HCT 29.5 (L) 07/06/2023   MCV 94.2 07/06/2023   MCH 31.0 07/06/2023   PLT 234 07/06/2023   MCHC 32.9 07/06/2023   RDW 14.7 07/06/2023   LYMPHSABS 1.4 03/21/2023   MONOABS 0.7 03/21/2023   EOSABS 0.0 03/21/2023   BASOSABS 0.0 03/21/2023     Last metabolic panel Lab Results  Component Value Date   NA 133 (L) 07/06/2023   K 3.9 07/06/2023   CL 101 07/06/2023   CO2 25 07/06/2023   BUN 15 07/06/2023   CREATININE 1.38 (H) 07/06/2023   GLUCOSE 103 (H) 07/06/2023   GFRNONAA >60 07/06/2023   GFRAA 68 02/06/2020   CALCIUM 7.9 (L) 07/06/2023   PHOS 2.2 (L) 05/11/2023   PROT 6.6 07/04/2023   ALBUMIN 3.6 07/04/2023   LABGLOB 3.0 05/28/2023   AGRATIO 1.5 02/06/2020   BILITOT 0.7 07/04/2023   ALKPHOS 56 07/04/2023   AST 17 07/04/2023   ALT 24 07/04/2023   ANIONGAP 7 07/06/2023    GFR: Estimated Creatinine Clearance: 80.3 mL/min (A) (by C-G formula based on SCr of 1.38 mg/dL (H)).  Recent Results (from the past 240 hour(s))  MRSA Next Gen by PCR, Nasal     Status: None   Collection Time: 07/04/23  4:46 PM   Specimen: Nasal Mucosa; Nasal Swab  Result Value Ref Range Status   MRSA by PCR Next Gen NOT DETECTED NOT DETECTED Final    Comment: (NOTE) The GeneXpert MRSA Assay (FDA approved for NASAL specimens only), is one component of a comprehensive MRSA colonization surveillance program. It is not intended to diagnose MRSA infection nor to guide or monitor treatment for MRSA infections. Test performance is not FDA approved in patients less than 66 years old. Performed at [facility], [address] [place], [place] [address]       Radiology Studies: IR US Guide Vasc Access Right  Result Date: 07/05/2023 INDICATION: 52 year old male on Coumadin with for a prior aortic valve replacement. 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  EXAM: IR EMBO ART VEN HEMORR LYMPH EXTRAV INC GUIDE ROADMAPPING; ADDITIONAL ARTERIOGRAPHY MEDICATIONS: None. ANESTHESIA/SEDATION: Moderate (conscious) sedation was employed during this procedure. A total of Versed 2 mg and Fentanyl 50 mcg was administered intravenously by radiology nursing. Moderate Sedation Time: 70 minutes. The patient's level of consciousness and vital signs were monitored continuously by radiology nursing throughout the procedure under my direct supervision. CONTRAST:  100 mL Omnipaque 300 FLUOROSCOPY: Radiation Exposure Index (as provided by the fluoroscopic device): 2,922 mGy Kerma COMPLICATIONS: None immediate. PROCEDURE: Informed consent was obtained from the patient following explanation of the procedure, risks, benefits and alternatives. The patient understands, agrees and consents for the procedure. All questions were addressed. A time out was performed prior to the initiation of the procedure. Maximal barrier sterile technique utilized including caps, mask, sterile gowns, sterile gloves, large sterile drape, hand hygiene, and Betadine prep. The right common femoral artery was interrogated with ultrasound and found to be widely patent. An image was obtained and stored for the medical record. Local anesthesia was attained by infiltration with 1% lidocaine. A small dermatotomy was made. Under real-time sonographic guidance, the vessel was punctured with a 21 gauge micropuncture needle. Using standard technique, the initial micro needle was exchanged over a 0.018 micro wire for a transitional 4 [place] micro sheath. The micro sheath was then exchanged over a  0.035 wire for a 5 [place] vascular sheath. A C2 cobra catheter was advanced over a Bentson wire and used to select the celiac artery. A celiac arteriogram was performed. No evidence of active bleeding. No evidence of replaced colic artery. The C2 cobra catheter was next advanced into the superior mesenteric artery. A arteriogram was  performed. There is evidence of active bleeding arising from the terminal branch in the ascending colon as expected from the prior CTA. A Cook can tot a microcatheter was advanced into the ileocolic. An arteriogram was performed. The active bleeding branch does not appear to arise from the distal ileo colic artery. The microcatheter was next advanced into the right colic artery. Arteriography was performed. The hemorrhage is again visualized. Have appears to arise at the anti mesenteric border of the colon interval region of potentially several feeding distal branch arteries. The microcatheter was advanced into the first potential arterial feeder. Contrast injection confirms active hemorrhage. Coil embolization was performed utilizing a series of 1 and 2 mm low-profile penumbra detachable coils. The microcatheter was brought back. Contrast injection was performed. The embolized vessel is successfully embolized. However, additional bleeding is occurring from a more cephalad branch. The microcatheter was successfully navigated into this branch. Contrast injection was performed confirming active bleeding. Coil embolization was then performed. During the coil embolization there was some extravasation of contrast likely from a coil protruding through the very small arterial wall. Additional coil embolization was performed with successful cessation of hemorrhage. Follow-up contrast injection again performed there is a third tiny distal branch artery still contributing 2 bleeding at the diverticular site. The microcatheter was advanced into this third artery and additional coil embolization was performed. As before, a combination of low-profile penumbra detachable coils in 2 and 3 mm diameters were deployed. Final arteriography demonstrates cessation bleeding. The catheter system was removed. Hemostasis was attained with the assistance of a Celt arterial closure device. IMPRESSION: Successful embolization of distal  branches of the right colic artery which were providing blood flow to the hemorrhaging ascending colonic diverticulum. No evidence of continued bleeding at the end of the procedure. Electronically Signed   By: Malachy Moan M.D.   On: 07/05/2023 12:26   IR Angiogram Visceral Selective  Result Date: 07/05/2023 INDICATION: 52 year old male on Coumadin with for a prior aortic valve replacement. 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 EXAM: IR EMBO ART VEN HEMORR LYMPH EXTRAV INC GUIDE ROADMAPPING; ADDITIONAL ARTERIOGRAPHY MEDICATIONS: None. ANESTHESIA/SEDATION: Moderate (conscious) sedation was employed during this procedure. A total of Versed 2 mg and Fentanyl 50 mcg was administered intravenously by radiology nursing. Moderate Sedation Time: 70 minutes. The patient's level of consciousness and vital signs were monitored continuously by radiology nursing throughout the procedure under my direct supervision. CONTRAST:  100 mL Omnipaque 300 FLUOROSCOPY: Radiation Exposure Index (as provided by the fluoroscopic device): 2,922 mGy Kerma COMPLICATIONS: None immediate. PROCEDURE: Informed consent was obtained from the patient following explanation of the procedure, risks, benefits and alternatives. The patient understands, agrees and consents for the procedure. All questions were addressed. A time out was performed prior to the initiation of the procedure. Maximal barrier sterile technique utilized including caps, mask, sterile gowns, sterile gloves, large sterile drape, hand hygiene, and Betadine prep. The right common femoral artery was interrogated with ultrasound and found to be widely patent. An image was obtained and stored for the medical record. Local anesthesia was attained by infiltration with 1% lidocaine. A  small dermatotomy was made. Under real-time sonographic guidance, the vessel was punctured with a 21 gauge micropuncture needle. Using  standard technique, the initial micro needle was exchanged over a 0.018 micro wire for a transitional 4 [place] micro sheath. The micro sheath was then exchanged over a 0.035 wire for a 5 French vascular sheath. A C2 cobra catheter was advanced over a Bentson wire and used to select the celiac artery. A celiac arteriogram was performed. No evidence of active bleeding. No evidence of replaced colic artery. The C2 cobra catheter was next advanced into the superior mesenteric artery. A arteriogram was performed. There is evidence of active bleeding arising from the terminal branch in the ascending colon as expected from the prior CTA. A Cook can tot a microcatheter was advanced into the ileocolic. An arteriogram was performed. The active bleeding branch does not appear to arise from the distal ileo colic artery. The microcatheter was next advanced into the right colic artery. Arteriography was performed. The hemorrhage is again visualized. Have appears to arise at the anti mesenteric border of the colon interval region of potentially several feeding distal branch arteries. The microcatheter was advanced into the first potential arterial feeder. Contrast injection confirms active hemorrhage. Coil embolization was performed utilizing a series of 1 and 2 mm low-profile penumbra detachable coils. The microcatheter was brought back. Contrast injection was performed. The embolized vessel is successfully embolized. However, additional bleeding is occurring from a more cephalad branch. The microcatheter was successfully navigated into this branch. Contrast injection was performed confirming active bleeding. Coil embolization was then performed. During the coil embolization there was some extravasation of contrast likely from a coil protruding through the very small arterial wall. Additional coil embolization was performed with successful cessation of hemorrhage. Follow-up contrast injection again performed there is a third tiny  distal branch artery still contributing 2 bleeding at the diverticular site. The microcatheter was advanced into this third artery and additional coil embolization was performed. As before, a combination of low-profile penumbra detachable coils in 2 and 3 mm diameters were deployed. Final arteriography demonstrates cessation bleeding. The catheter system was removed. Hemostasis was attained with the assistance of a Celt arterial closure device. IMPRESSION: Successful embolization of distal branches of the right colic artery which were providing blood flow to the hemorrhaging ascending colonic diverticulum. No evidence of continued bleeding at the end of the procedure. Electronically Signed   By: Malachy Moan M.D.   On: 07/05/2023 12:26   IR Angiogram Visceral Selective  Result Date: 07/05/2023 INDICATION: 52 year old male on Coumadin with for a prior aortic valve replacement. 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 EXAM: IR EMBO ART VEN HEMORR LYMPH EXTRAV INC GUIDE ROADMAPPING; ADDITIONAL ARTERIOGRAPHY MEDICATIONS: None. ANESTHESIA/SEDATION: Moderate (conscious) sedation was employed during this procedure. A total of Versed 2 mg and Fentanyl 50 mcg was administered intravenously by radiology nursing. Moderate Sedation Time: 70 minutes. The patient's level of consciousness and vital signs were monitored continuously by radiology nursing throughout the procedure under my direct supervision. CONTRAST:  100 mL Omnipaque 300 FLUOROSCOPY: Radiation Exposure Index (as provided by the fluoroscopic device): 2,922 mGy Kerma COMPLICATIONS: None immediate. PROCEDURE: Informed consent was obtained from the patient following explanation of the procedure, risks, benefits and alternatives. The patient understands, agrees and consents for the procedure. All questions were addressed. A time out was performed prior to the initiation of the procedure. Maximal  barrier sterile technique utilized including caps,  mask, sterile gowns, sterile gloves, large sterile drape, hand hygiene, and Betadine prep. The right common femoral artery was interrogated with ultrasound and found to be widely patent. An image was obtained and stored for the medical record. Local anesthesia was attained by infiltration with 1% lidocaine. A small dermatotomy was made. Under real-time sonographic guidance, the vessel was punctured with a 21 gauge micropuncture needle. Using standard technique, the initial micro needle was exchanged over a 0.018 micro wire for a transitional 4 [place] micro sheath. The micro sheath was then exchanged over a 0.035 wire for a 5 French vascular sheath. A C2 cobra catheter was advanced over a Bentson wire and used to select the celiac artery. A celiac arteriogram was performed. No evidence of active bleeding. No evidence of replaced colic artery. The C2 cobra catheter was next advanced into the superior mesenteric artery. A arteriogram was performed. There is evidence of active bleeding arising from the terminal branch in the ascending colon as expected from the prior CTA. A Cook can tot a microcatheter was advanced into the ileocolic. An arteriogram was performed. The active bleeding branch does not appear to arise from the distal ileo colic artery. The microcatheter was next advanced into the right colic artery. Arteriography was performed. The hemorrhage is again visualized. Have appears to arise at the anti mesenteric border of the colon interval region of potentially several feeding distal branch arteries. The microcatheter was advanced into the first potential arterial feeder. Contrast injection confirms active hemorrhage. Coil embolization was performed utilizing a series of 1 and 2 mm low-profile penumbra detachable coils. The microcatheter was brought back. Contrast injection was performed. The embolized vessel is successfully embolized. However, additional  bleeding is occurring from a more cephalad branch. The microcatheter was successfully navigated into this branch. Contrast injection was performed confirming active bleeding. Coil embolization was then performed. During the coil embolization there was some extravasation of contrast likely from a coil protruding through the very small arterial wall. Additional coil embolization was performed with successful cessation of hemorrhage. Follow-up contrast injection again performed there is a third tiny distal branch artery still contributing 2 bleeding at the diverticular site. The microcatheter was advanced into this third artery and additional coil embolization was performed. As before, a combination of low-profile penumbra detachable coils in 2 and 3 mm diameters were deployed. Final arteriography demonstrates cessation bleeding. The catheter system was removed. Hemostasis was attained with the assistance of a Celt arterial closure device. IMPRESSION: Successful embolization of distal branches of the right colic artery which were providing blood flow to the hemorrhaging ascending colonic diverticulum. No evidence of continued bleeding at the end of the procedure. Electronically Signed   By: Malachy Moan M.D.   On: 07/05/2023 12:26   IR Angiogram Selective Each Additional Vessel  Result Date: 07/05/2023 INDICATION: 52 year old male on Coumadin with for a prior aortic valve replacement. 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 EXAM: IR EMBO ART VEN HEMORR LYMPH EXTRAV INC GUIDE ROADMAPPING; ADDITIONAL ARTERIOGRAPHY MEDICATIONS: None. ANESTHESIA/SEDATION: Moderate (conscious) sedation was employed during this procedure. A total of Versed 2 mg and Fentanyl 50 mcg was administered intravenously by radiology nursing. Moderate Sedation Time: 70 minutes. The patient's level of consciousness and vital signs were monitored continuously by radiology nursing  throughout the procedure under my direct supervision. CONTRAST:  100 mL Omnipaque 300 FLUOROSCOPY: Radiation Exposure Index (as provided by the fluoroscopic device): 2,922 mGy Kerma COMPLICATIONS: None immediate.  PROCEDURE: Informed consent was obtained from the patient following explanation of the procedure, risks, benefits and alternatives. The patient understands, agrees and consents for the procedure. All questions were addressed. A time out was performed prior to the initiation of the procedure. Maximal barrier sterile technique utilized including caps, mask, sterile gowns, sterile gloves, large sterile drape, hand hygiene, and Betadine prep. The right common femoral artery was interrogated with ultrasound and found to be widely patent. An image was obtained and stored for the medical record. Local anesthesia was attained by infiltration with 1% lidocaine. A small dermatotomy was made. Under real-time sonographic guidance, the vessel was punctured with a 21 gauge micropuncture needle. Using standard technique, the initial micro needle was exchanged over a 0.018 micro wire for a transitional 4 [place] micro sheath. The micro sheath was then exchanged over a 0.035 wire for a 5 French vascular sheath. A C2 cobra catheter was advanced over a Bentson wire and used to select the celiac artery. A celiac arteriogram was performed. No evidence of active bleeding. No evidence of replaced colic artery. The C2 cobra catheter was next advanced into the superior mesenteric artery. A arteriogram was performed. There is evidence of active bleeding arising from the terminal branch in the ascending colon as expected from the prior CTA. A Cook can tot a microcatheter was advanced into the ileocolic. An arteriogram was performed. The active bleeding branch does not appear to arise from the distal ileo colic artery. The microcatheter was next advanced into the right colic artery. Arteriography was performed. The hemorrhage is again  visualized. Have appears to arise at the anti mesenteric border of the colon interval region of potentially several feeding distal branch arteries. The microcatheter was advanced into the first potential arterial feeder. Contrast injection confirms active hemorrhage. Coil embolization was performed utilizing a series of 1 and 2 mm low-profile penumbra detachable coils. The microcatheter was brought back. Contrast injection was performed. The embolized vessel is successfully embolized. However, additional bleeding is occurring from a more cephalad branch. The microcatheter was successfully navigated into this branch. Contrast injection was performed confirming active bleeding. Coil embolization was then performed. During the coil embolization there was some extravasation of contrast likely from a coil protruding through the very small arterial wall. Additional coil embolization was performed with successful cessation of hemorrhage. Follow-up contrast injection again performed there is a third tiny distal branch artery still contributing 2 bleeding at the diverticular site. The microcatheter was advanced into this third artery and additional coil embolization was performed. As before, a combination of low-profile penumbra detachable coils in 2 and 3 mm diameters were deployed. Final arteriography demonstrates cessation bleeding. The catheter system was removed. Hemostasis was attained with the assistance of a Celt arterial closure device. IMPRESSION: Successful embolization of distal branches of the right colic artery which were providing blood flow to the hemorrhaging ascending colonic diverticulum. No evidence of continued bleeding at the end of the procedure. Electronically Signed   By: Malachy Moan M.D.   On: 07/05/2023 12:26   IR EMBO ART  VEN HEMORR LYMPH EXTRAV  INC GUIDE ROADMAPPING  Result Date: 07/04/2023 INDICATION: 52 year old male on Coumadin with for a prior aortic valve replacement. He has  active bleeding arising from a diverticulum in the ascending colon with evidence of hypotension. EXAM: IR EMBO ART VEN HEMORR LYMPH EXTRAV INC GUIDE ROADMAPPING; ADDITIONAL ARTERIOGRAPHY MEDICATIONS: None. ANESTHESIA/SEDATION: Moderate (conscious) sedation was employed during this procedure. A total of Versed 2 mg and Fentanyl 50  mcg was administered intravenously by radiology nursing. Moderate Sedation Time: 70 minutes. The patient's level of consciousness and vital signs were monitored continuously by radiology nursing throughout the procedure under my direct supervision. CONTRAST:  100 mL Omnipaque 300 FLUOROSCOPY: Radiation Exposure Index (as provided by the fluoroscopic device): 2,922 mGy Kerma COMPLICATIONS: None immediate. PROCEDURE: Informed consent was obtained from the patient following explanation of the procedure, risks, benefits and alternatives. The patient understands, agrees and consents for the procedure. All questions were addressed. A time out was performed prior to the initiation of the procedure. Maximal barrier sterile technique utilized including caps, mask, sterile gowns, sterile gloves, large sterile drape, hand hygiene, and Betadine prep. The right common femoral artery was interrogated with ultrasound and found to be widely patent. An image was obtained and stored for the medical record. Local anesthesia was attained by infiltration with 1% lidocaine. A small dermatotomy was made. Under real-time sonographic guidance, the vessel was punctured with a 21 gauge micropuncture needle. Using standard technique, the initial micro needle was exchanged over a 0.018 micro wire for a transitional 4 [place] micro sheath. The micro sheath was then exchanged over a 0.035 wire for a 5 French vascular sheath. A C2 cobra catheter was advanced over a Bentson wire and used to select the celiac artery. A celiac arteriogram was performed. No evidence of active bleeding. No evidence of replaced colic artery. The  C2 cobra catheter was next advanced into the superior mesenteric artery. A arteriogram was performed. There is evidence of active bleeding arising from the terminal branch in the ascending colon as expected from the prior CTA. A Cook can tot a microcatheter was advanced into the ileocolic. An arteriogram was performed. The active bleeding branch does not appear to arise from the distal ileo colic artery. The microcatheter was next advanced into the right colic artery. Arteriography was performed. The hemorrhage is again visualized. Have appears to arise at the anti mesenteric border of the colon interval region of potentially several feeding distal branch arteries. The microcatheter was advanced into the first potential arterial feeder. Contrast injection confirms active hemorrhage. Coil embolization was performed utilizing a series of 1 and 2 mm low-profile penumbra detachable coils. The microcatheter was brought back. Contrast injection was performed. The embolized vessel is successfully embolized. However, additional bleeding is occurring from a more cephalad branch. The microcatheter was successfully navigated into this branch. Contrast injection was performed confirming active bleeding. Coil embolization was then performed. During the coil embolization there was some extravasation of contrast likely from a coil protruding through the very small arterial wall. Additional coil embolization was performed with successful cessation of hemorrhage. Follow-up contrast injection again performed there is a third tiny distal branch artery still contributing 2 bleeding at the diverticular site. The microcatheter was advanced into this third artery and additional coil embolization was performed. As before, a combination of low-profile penumbra detachable coils in 2 and 3 mm diameters were deployed. Final arteriography demonstrates cessation bleeding. The catheter system was removed. Hemostasis was attained with the  assistance of a Celt arterial closure device. IMPRESSION: Successful embolization of distal branches of the right colic artery which were providing blood flow to the hemorrhaging ascending colonic diverticulum. No evidence of continued bleeding at the end of the procedure. Electronically Signed   By: Malachy Moan M.D.   On: 07/04/2023 15:34   IR Angiogram Selective Each Additional Vessel  Result Date: 07/04/2023 INDICATION: 52 year old male on Coumadin with for a prior aortic valve replacement.  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 EXAM: IR EMBO ART VEN HEMORR LYMPH EXTRAV INC GUIDE ROADMAPPING; ADDITIONAL ARTERIOGRAPHY MEDICATIONS: None. ANESTHESIA/SEDATION: Moderate (conscious) sedation was employed during this procedure. A total of Versed 2 mg and Fentanyl 50 mcg was administered intravenously by radiology nursing. Moderate Sedation Time: 70 minutes. The patient's level of consciousness and vital signs were monitored continuously by radiology nursing throughout the procedure under my direct supervision. CONTRAST:  100 mL Omnipaque 300 FLUOROSCOPY: Radiation Exposure Index (as provided by the fluoroscopic device): 2,922 mGy Kerma COMPLICATIONS: None immediate. PROCEDURE: Informed consent was obtained from the patient following explanation of the procedure, risks, benefits and alternatives. The patient understands, agrees and consents for the procedure. All questions were addressed. A time out was performed prior to the initiation of the procedure. Maximal barrier sterile technique utilized including caps, mask, sterile gowns, sterile gloves, large sterile drape, hand hygiene, and Betadine prep. The right common femoral artery was interrogated with ultrasound and found to be widely patent. An image was obtained and stored for the medical record. Local anesthesia was attained by infiltration with 1% lidocaine. A small dermatotomy was made.  Under real-time sonographic guidance, the vessel was punctured with a 21 gauge micropuncture needle. Using standard technique, the initial micro needle was exchanged over a 0.018 micro wire for a transitional 4 [place] micro sheath. The micro sheath was then exchanged over a 0.035 wire for a 5 French vascular sheath. A C2 cobra catheter was advanced over a Bentson wire and used to select the celiac artery. A celiac arteriogram was performed. No evidence of active bleeding. No evidence of replaced colic artery. The C2 cobra catheter was next advanced into the superior mesenteric artery. A arteriogram was performed. There is evidence of active bleeding arising from the terminal branch in the ascending colon as expected from the prior CTA. A Cook can tot a microcatheter was advanced into the ileocolic. An arteriogram was performed. The active bleeding branch does not appear to arise from the distal ileo colic artery. The microcatheter was next advanced into the right colic artery. Arteriography was performed. The hemorrhage is again visualized. Have appears to arise at the anti mesenteric border of the colon interval region of potentially several feeding distal branch arteries. The microcatheter was advanced into the first potential arterial feeder. Contrast injection confirms active hemorrhage. Coil embolization was performed utilizing a series of 1 and 2 mm low-profile penumbra detachable coils. The microcatheter was brought back. Contrast injection was performed. The embolized vessel is successfully embolized. However, additional bleeding is occurring from a more cephalad branch. The microcatheter was successfully navigated into this branch. Contrast injection was performed confirming active bleeding. Coil embolization was then performed. During the coil embolization there was some extravasation of contrast likely from a coil protruding through the very small arterial wall. Additional coil embolization was performed  with successful cessation of hemorrhage. Follow-up contrast injection again performed there is a third tiny distal branch artery still contributing 2 bleeding at the diverticular site. The microcatheter was advanced into this third artery and additional coil embolization was performed. As before, a combination of low-profile penumbra detachable coils in 2 and 3 mm diameters were deployed. Final arteriography demonstrates cessation bleeding. The catheter system was removed. Hemostasis was attained with the assistance of a Celt arterial closure device. IMPRESSION: Successful embolization of distal branches of the right colic artery which were providing blood flow to the hemorrhaging ascending colonic diverticulum.  No evidence of continued bleeding at the end of the procedure. Electronically Signed   By: Malachy Moan M.D.   On: 07/04/2023 15:34   IR Angiogram Selective Each Additional Vessel  Result Date: 07/04/2023 INDICATION: 52 year old male on Coumadin with for a prior aortic valve replacement. 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 EXAM: IR EMBO ART VEN HEMORR LYMPH EXTRAV INC GUIDE ROADMAPPING; ADDITIONAL ARTERIOGRAPHY MEDICATIONS: None. ANESTHESIA/SEDATION: Moderate (conscious) sedation was employed during this procedure. A total of Versed 2 mg and Fentanyl 50 mcg was administered intravenously by radiology nursing. Moderate Sedation Time: 70 minutes. The patient's level of consciousness and vital signs were monitored continuously by radiology nursing throughout the procedure under my direct supervision. CONTRAST:  100 mL Omnipaque 300 FLUOROSCOPY: Radiation Exposure Index (as provided by the fluoroscopic device): 2,922 mGy Kerma COMPLICATIONS: None immediate. PROCEDURE: Informed consent was obtained from the patient following explanation of the procedure, risks, benefits and alternatives. The patient understands, agrees and consents for  the procedure. All questions were addressed. A time out was performed prior to the initiation of the procedure. Maximal barrier sterile technique utilized including caps, mask, sterile gowns, sterile gloves, large sterile drape, hand hygiene, and Betadine prep. The right common femoral artery was interrogated with ultrasound and found to be widely patent. An image was obtained and stored for the medical record. Local anesthesia was attained by infiltration with 1% lidocaine. A small dermatotomy was made. Under real-time sonographic guidance, the vessel was punctured with a 21 gauge micropuncture needle. Using standard technique, the initial micro needle was exchanged over a 0.018 micro wire for a transitional 4 [place] micro sheath. The micro sheath was then exchanged over a 0.035 wire for a 5 French vascular sheath. A C2 cobra catheter was advanced over a Bentson wire and used to select the celiac artery. A celiac arteriogram was performed. No evidence of active bleeding. No evidence of replaced colic artery. The C2 cobra catheter was next advanced into the superior mesenteric artery. A arteriogram was performed. There is evidence of active bleeding arising from the terminal branch in the ascending colon as expected from the prior CTA. A Cook can tot a microcatheter was advanced into the ileocolic. An arteriogram was performed. The active bleeding branch does not appear to arise from the distal ileo colic artery. The microcatheter was next advanced into the right colic artery. Arteriography was performed. The hemorrhage is again visualized. Have appears to arise at the anti mesenteric border of the colon interval region of potentially several feeding distal branch arteries. The microcatheter was advanced into the first potential arterial feeder. Contrast injection confirms active hemorrhage. Coil embolization was performed utilizing a series of 1 and 2 mm low-profile penumbra detachable coils. The microcatheter was  brought back. Contrast injection was performed. The embolized vessel is successfully embolized. However, additional bleeding is occurring from a more cephalad branch. The microcatheter was successfully navigated into this branch. Contrast injection was performed confirming active bleeding. Coil embolization was then performed. During the coil embolization there was some extravasation of contrast likely from a coil protruding through the very small arterial wall. Additional coil embolization was performed with successful cessation of hemorrhage. Follow-up contrast injection again performed there is a third tiny distal branch artery still contributing 2 bleeding at the diverticular site. The microcatheter was advanced into this third artery and additional coil embolization was performed. As before, a combination of low-profile penumbra detachable coils in 2 and  3 mm diameters were deployed. Final arteriography demonstrates cessation bleeding. The catheter system was removed. Hemostasis was attained with the assistance of a Celt arterial closure device. IMPRESSION: Successful embolization of distal branches of the right colic artery which were providing blood flow to the hemorrhaging ascending colonic diverticulum. No evidence of continued bleeding at the end of the procedure. Electronically Signed   By: Malachy Moan M.D.   On: 07/04/2023 15:34   IR Angiogram Selective Each Additional Vessel  Result Date: 07/04/2023 INDICATION: 52 year old male on Coumadin with for a prior aortic valve replacement. 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 EXAM: IR EMBO ART VEN HEMORR LYMPH EXTRAV INC GUIDE ROADMAPPING; ADDITIONAL ARTERIOGRAPHY MEDICATIONS: None. ANESTHESIA/SEDATION: Moderate (conscious) sedation was employed during this procedure. A total of Versed 2 mg and Fentanyl 50 mcg was administered intravenously by radiology nursing. Moderate Sedation Time:  70 minutes. The patient's level of consciousness and vital signs were monitored continuously by radiology nursing throughout the procedure under my direct supervision. CONTRAST:  100 mL Omnipaque 300 FLUOROSCOPY: Radiation Exposure Index (as provided by the fluoroscopic device): 2,922 mGy Kerma COMPLICATIONS: None immediate. PROCEDURE: Informed consent was obtained from the patient following explanation of the procedure, risks, benefits and alternatives. The patient understands, agrees and consents for the procedure. All questions were addressed. A time out was performed prior to the initiation of the procedure. Maximal barrier sterile technique utilized including caps, mask, sterile gowns, sterile gloves, large sterile drape, hand hygiene, and Betadine prep. The right common femoral artery was interrogated with ultrasound and found to be widely patent. An image was obtained and stored for the medical record. Local anesthesia was attained by infiltration with 1% lidocaine. A small dermatotomy was made. Under real-time sonographic guidance, the vessel was punctured with a 21 gauge micropuncture needle. Using standard technique, the initial micro needle was exchanged over a 0.018 micro wire for a transitional 4 [place] micro sheath. The micro sheath was then exchanged over a 0.035 wire for a 5 French vascular sheath. A C2 cobra catheter was advanced over a Bentson wire and used to select the celiac artery. A celiac arteriogram was performed. No evidence of active bleeding. No evidence of replaced colic artery. The C2 cobra catheter was next advanced into the superior mesenteric artery. A arteriogram was performed. There is evidence of active bleeding arising from the terminal branch in the ascending colon as expected from the prior CTA. A Cook can tot a microcatheter was advanced into the ileocolic. An arteriogram was performed. The active bleeding branch does not appear to arise from the distal ileo colic artery. The  microcatheter was next advanced into the right colic artery. Arteriography was performed. The hemorrhage is again visualized. Have appears to arise at the anti mesenteric border of the colon interval region of potentially several feeding distal branch arteries. The microcatheter was advanced into the first potential arterial feeder. Contrast injection confirms active hemorrhage. Coil embolization was performed utilizing a series of 1 and 2 mm low-profile penumbra detachable coils. The microcatheter was brought back. Contrast injection was performed. The embolized vessel is successfully embolized. However, additional bleeding is occurring from a more cephalad branch. The microcatheter was successfully navigated into this branch. Contrast injection was performed confirming active bleeding. Coil embolization was then performed. During the coil embolization there was some extravasation of contrast likely from a coil protruding through the very small arterial wall. Additional coil embolization was performed with successful cessation  of hemorrhage. Follow-up contrast injection again performed there is a third tiny distal branch artery still contributing 2 bleeding at the diverticular site. The microcatheter was advanced into this third artery and additional coil embolization was performed. As before, a combination of low-profile penumbra detachable coils in 2 and 3 mm diameters were deployed. Final arteriography demonstrates cessation bleeding. The catheter system was removed. Hemostasis was attained with the assistance of a Celt arterial closure device. IMPRESSION: Successful embolization of distal branches of the right colic artery which were providing blood flow to the hemorrhaging ascending colonic diverticulum. No evidence of continued bleeding at the end of the procedure. Electronically Signed   By: Malachy Moan M.D.   On: 07/04/2023 15:34   CT ANGIO GI BLEED  Result Date: 07/04/2023 CLINICAL DATA:  Bloody  stools since this morning. Patient on Eliquis. Abdominal aortic aneurysm, postop. EXAM: CTA ABDOMEN AND PELVIS WITHOUT AND WITH CONTRAST TECHNIQUE: Multidetector CT imaging of the abdomen and pelvis was performed using the standard protocol during bolus administration of intravenous contrast. Multiplanar reconstructed images and MIPs were obtained and reviewed to evaluate the vascular anatomy. RADIATION DOSE REDUCTION: This exam was performed according to the departmental dose-optimization program which includes automated exposure control, adjustment of the mA and/or kV according to patient size and/or use of iterative reconstruction technique. CONTRAST:  OMNIPAQUE IOHEXOL 350 MG/ML SOLN COMPARISON:  03/21/2023 FINDINGS: VASCULAR Aorta: Normal caliber aorta without aneurysm, dissection, vasculitis or significant stenosis. Celiac: Patent without evidence of aneurysm, dissection, vasculitis or significant stenosis. SMA: Patent without evidence of aneurysm, dissection, vasculitis or significant stenosis. Renals: Both renal arteries are patent without evidence of aneurysm, dissection, vasculitis, fibromuscular dysplasia or significant stenosis. IMA: Patent without evidence of aneurysm, dissection, vasculitis or significant stenosis. Inflow: Patent without evidence of aneurysm, dissection, vasculitis or significant stenosis. Proximal Outflow: Bilateral common femoral and visualized portions of the superficial and profunda femoral arteries are patent without evidence of aneurysm, dissection, vasculitis or significant stenosis. Veins: No obvious venous abnormality within the limitations of this arterial phase study. Review of the MIP images confirms the above findings. NON-VASCULAR Lower chest: Minimal linear atelectasis/scarring in the lung bases. Median sternotomy wires are present. Partially visualized hardware over the aortic root. Hepatobiliary: Previous cholecystectomy. Subcentimeter cyst over the dome of the  liver. Biliary tree is normal. Pancreas: Normal. Spleen: Previous splenectomy. Several small splenic remnants over the left lateral abdomen unchanged. Adrenals/Urinary Tract: Adrenal glands are normal. Kidneys are normal in size without hydronephrosis or nephrolithiasis. Ureters and bladder are unremarkable. Stomach/Bowel: Surgical clips over the proximal stomach. Small bowel is within normal. Appendix is normal. There is significant diverticulosis over the ascending colon as well as the distal descending and sigmoid colon. No definite diverticulitis. The arterial phase images demonstrate hyperdense material within the midportion of the ascending colon with mild pooling within the dependent portion of the ascending colon as this appears to be originating from a posterior diverticula compatible with acute bleed. The venous phase images again demonstrate this hyperdense material which is more dense and concentrated along the anterior aspect of the ascending colon at the base of several additional diverticula. No evidence of perforation/free peritoneal air. Lymphatic: No adenopathy. Reproductive: Normal. Other: Postsurgical change of the anterior abdominal wall likely from previous hernia repair. Musculoskeletal: Old pelvic fractures. Mild degenerative change of the hips and spine. IMPRESSION: 1. Acute bleed within the midportion of the ascending colon likely originating from a posterior diverticula, although more concentrated adjacent the base of an anterior diverticula  in this same region on the delayed images. No evidence of perforation/free peritoneal air. 2. Significant diverticulosis over the ascending colon as well as the distal descending and sigmoid colon. No acute diverticulitis. 3. Previous cholecystectomy and splenectomy. Subcentimeter hepatic cyst. Critical Value/emergent results were called by telephone at the time of interpretation on 07/04/2023 at 1:07 pm to provider Lauren Roemildt, who verbally  acknowledged these results. Electronically Signed   By: Elberta Fortis M.D.   On: 07/04/2023 13:09      LOS: 2 days    Jacquelin Hawking, MD [facility] 07/06/2023, 8:21 AM   If 7PM-7AM, please contact night-coverage [email or web address]

## 2023-07-06 NOTE — Progress Notes (Signed)
ANTICOAGULATION CONSULT NOTE - Follow Up  Pharmacy Consult for heparin Indication:  Warfarin (for afib, mechanical aortic valve) on hold. Heparin bridge for subtherapeutic INR <2.5.   Allergies  Allergen Reactions   Ibuprofen-Acetaminophen Other (See Comments)    Advise by doctor, Due to kidney issues    Patient Measurements: Height: 6' (182.9 cm) Weight: 107.9 kg (237 lb 14 oz) IBW/kg (Calculated) : 77.6 Heparin Dosing Weight: 100 kg  Vital Signs: Temp: 98.4 F (36.9 C) (09/17 0800) Temp Source: Oral (09/17 0800) BP: 132/73 (09/17 0800) Pulse Rate: 88 (09/17 0800)  Labs: Recent Labs    07/04/23 1130 07/04/23 2152 07/05/23 0317 07/05/23 0739 07/05/23 1031 07/05/23 1844 07/06/23 0310  HGB 13.3   < > 11.0* 10.4* 10.3*  --  9.7*  HCT 41.2   < > 32.8* 31.6* 30.8*  --  29.5*  PLT 319  --   --  224  --   --  234  LABPROT 27.0*  --  28.8* 28.3*  --  28.4* 28.8*  INR 2.5*  --  2.7* 2.6*  --  2.6* 2.7*  CREATININE 1.53*  --  1.31* 1.48*  --   --  1.38*   < > = values in this interval not displayed.    Estimated Creatinine Clearance: 80.3 mL/min (A) (by C-G formula based on SCr of 1.38 mg/dL (H)).   Medical History: Past Medical History:  Diagnosis Date   Aortic aneurysm without rupture (HCC)    Chest pain of uncertain etiology 08/11/2021   CKD (chronic kidney disease)    GERD (gastroesophageal reflux disease)    Heart failure (HCC)    Hypertension    Obesity (BMI 30-39.9) 08/11/2021   Sleep apnea     Medications: Warfarin PTA for afib, mechanical aortic valve -Last dose: 9/14  Information obtained from [facility] clinic note on 06/29/23: -INR goal: 2.5 - 3.5 -Home dose: warfarin 5 mg PO daily -Note initiation of amiodarone in August  Assessment: Pt is a 62 yoM admitted with rectal bleeding with associated dizziness/lightheadedness. CTA revealed acute bleeding in the ascending colon, diverticular. Pt underwent transfusion and embolization by IR on 9/15. No reversal  agents given.   Pt chronically anticoagulated with warfarin for afib, mechanical aortic valve placed in July 2024. Pharmacy consulted to initiate heparin for bridge therapy once INR is subtherapeutic <2.5.   Today, 07/06/23 INR = 2.7 this morning, remains therapeutic despite holding warfarin CBC: Hgb low/decreased, Plt WNL Hematochezia x1 on 9/16  Goal of Therapy:  INR 2.5 - 3.5 Heparin level 0.3 - 0.7 Monitor platelets by anticoagulation protocol: Yes   Plan:  CBC, INR daily Once INR <2.5, initiate heparin infusion  Follow for ability to restart warfarin Monitor for signs of bleeding  Cindi Carbon, PharmD 07/06/2023,10:30 AM

## 2023-07-07 DIAGNOSIS — I480 Paroxysmal atrial fibrillation: Secondary | ICD-10-CM | POA: Diagnosis not present

## 2023-07-07 DIAGNOSIS — D120 Benign neoplasm of cecum: Secondary | ICD-10-CM | POA: Diagnosis not present

## 2023-07-07 DIAGNOSIS — D62 Acute posthemorrhagic anemia: Secondary | ICD-10-CM

## 2023-07-07 DIAGNOSIS — K922 Gastrointestinal hemorrhage, unspecified: Secondary | ICD-10-CM | POA: Diagnosis not present

## 2023-07-07 DIAGNOSIS — Z7901 Long term (current) use of anticoagulants: Secondary | ICD-10-CM | POA: Diagnosis not present

## 2023-07-07 DIAGNOSIS — D125 Benign neoplasm of sigmoid colon: Secondary | ICD-10-CM | POA: Diagnosis not present

## 2023-07-07 DIAGNOSIS — K633 Ulcer of intestine: Secondary | ICD-10-CM | POA: Diagnosis not present

## 2023-07-07 DIAGNOSIS — D124 Benign neoplasm of descending colon: Secondary | ICD-10-CM | POA: Diagnosis not present

## 2023-07-07 DIAGNOSIS — Z5181 Encounter for therapeutic drug level monitoring: Secondary | ICD-10-CM

## 2023-07-07 LAB — HEPARIN LEVEL (UNFRACTIONATED)
Heparin Unfractionated: 0.52 [IU]/mL (ref 0.30–0.70)
Heparin Unfractionated: 0.77 [IU]/mL — ABNORMAL HIGH (ref 0.30–0.70)

## 2023-07-07 MED ORDER — PEG-KCL-NACL-NASULF-NA ASC-C 100 G PO SOLR
0.5000 | Freq: Two times a day (BID) | ORAL | Status: AC
  Administered 2023-07-07 (×2): 100 g via ORAL
  Filled 2023-07-07 (×2): qty 1

## 2023-07-07 MED ORDER — SODIUM CHLORIDE 0.9 % IV SOLN: INTRAVENOUS | Status: DC

## 2023-07-07 MED ORDER — HEPARIN (PORCINE) 25000 UT/250ML-% IV SOLN
1200.0000 [IU]/h | INTRAVENOUS | Status: AC
  Administered 2023-07-07: 1600 [IU]/h via INTRAVENOUS
  Administered 2023-07-07: 1450 [IU]/h via INTRAVENOUS
  Filled 2023-07-07 (×2): qty 250

## 2023-07-07 MED ORDER — ROSUVASTATIN CALCIUM 5 MG PO TABS
5.0000 mg | ORAL_TABLET | Freq: Every day | ORAL | Status: DC
  Administered 2023-07-07 – 2023-07-08 (×2): 5 mg via ORAL
  Filled 2023-07-07 (×2): qty 1

## 2023-07-07 NOTE — Consult Note (Signed)
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 None. ANESTHESIA/SEDATION: Moderate (conscious) sedation was employed during this procedure. A total of Versed 2 mg and Fentanyl 50 mcg was administered intravenously by radiology nursing. Moderate Sedation Time: 70 minutes. The patient's level of consciousness and vital signs were monitored continuously by radiology nursing throughout the procedure under my direct supervision. CONTRAST:  100 mL Omnipaque 300 FLUOROSCOPY: Radiation Exposure Index (as provided by the fluoroscopic device): 2,922 mGy Kerma COMPLICATIONS: None immediate. PROCEDURE: Informed consent was obtained from the patient following explanation of the procedure, risks, benefits and alternatives. The patient understands, agrees and consents for the procedure. All questions were addressed. A time out was performed prior to the initiation of the procedure. Maximal barrier sterile technique utilized including caps, mask, sterile gowns, sterile gloves, large sterile drape, hand hygiene, and Betadine prep. The right common femoral artery was interrogated with ultrasound and found  to be widely patent. An image was obtained and stored for the medical record. Local anesthesia was attained by infiltration with 1% lidocaine. A small dermatotomy was made. Under real-time sonographic guidance, the vessel was punctured with a 21 gauge micropuncture needle. Using standard technique, the initial micro needle was exchanged over a 0.018 micro wire for a transitional 4 [place] micro sheath. The micro sheath was then exchanged over a 0.035 wire for a 5 French vascular sheath. A C2 cobra  catheter was advanced over a Bentson wire and used to select the celiac artery. A celiac arteriogram was performed. No evidence of active bleeding. No evidence of replaced colic artery. The C2 cobra catheter was next advanced into the superior mesenteric artery. A arteriogram was performed. There is evidence of active bleeding arising from the terminal branch in the ascending colon as expected from the prior CTA. A Cook can tot a microcatheter was advanced into the ileocolic. An arteriogram was performed. The active bleeding branch does not appear to arise from the distal ileo colic artery. The microcatheter was next advanced into the right colic artery. Arteriography was performed. The hemorrhage is again visualized. Have appears to arise at the anti mesenteric border of the colon interval region of potentially several feeding distal branch arteries. The microcatheter was advanced into the first potential arterial feeder. Contrast injection confirms active hemorrhage. Coil embolization was performed utilizing a series of 1 and 2 mm low-profile penumbra detachable coils. The microcatheter was brought back. Contrast injection was performed. The embolized vessel is successfully embolized. However, additional bleeding is occurring from a more cephalad branch. The microcatheter was successfully navigated into this branch. Contrast injection was performed confirming active bleeding. Coil embolization was then performed. During  the coil embolization there was some extravasation of contrast likely from a coil protruding through the very small arterial wall. Additional coil embolization was performed with successful cessation of hemorrhage. Follow-up contrast injection again performed there is a third tiny distal branch artery still contributing 2 bleeding at the diverticular site. The microcatheter was advanced into this third artery and additional coil embolization was performed. As before, a combination of low-profile penumbra detachable coils in 2 and 3 mm diameters were deployed. Final arteriography demonstrates cessation bleeding. The catheter system was removed. Hemostasis was attained with the assistance of a Celt arterial closure device. IMPRESSION: Successful embolization of distal branches of the right colic artery which were providing blood flow to the hemorrhaging ascending colonic diverticulum. No evidence of continued bleeding at the end of the procedure. Electronically Signed   By: Malachy Moan M.D.   On: 07/05/2023 12:26   IR EMBO ART  VEN HEMORR LYMPH EXTRAV  INC GUIDE ROADMAPPING  Result Date: 07/04/2023 INDICATION: 52 year old male on Coumadin with for a prior aortic valve replacement. 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 EXAM: IR EMBO ART VEN HEMORR LYMPH EXTRAV INC GUIDE ROADMAPPING; ADDITIONAL ARTERIOGRAPHY MEDICATIONS: None. ANESTHESIA/SEDATION: Moderate (conscious) sedation was employed during this procedure. A total of Versed 2 mg and Fentanyl 50 mcg was administered intravenously by radiology nursing. Moderate Sedation Time: 70 minutes. The patient's level of consciousness and vital signs were monitored continuously by radiology nursing throughout the procedure under my direct supervision. CONTRAST:  100 mL Omnipaque 300 FLUOROSCOPY: Radiation Exposure Index (as provided by the fluoroscopic device): 2,922 mGy Kerma COMPLICATIONS: None  immediate. PROCEDURE: Informed consent was obtained from the patient following explanation of the procedure, risks, benefits and alternatives. The patient understands, agrees and consents for the procedure. All questions were addressed. A time out was performed prior to the initiation of the procedure. Maximal barrier sterile technique utilized including caps, mask, sterile gowns, sterile gloves, large sterile drape, hand hygiene, and Betadine prep. The right common femoral artery was interrogated with ultrasound and found to be widely patent. An image was obtained and stored for the medical record. Local anesthesia was attained by infiltration with 1% lidocaine. A small dermatotomy was  made. Under real-time sonographic guidance, the vessel was punctured with a 21 gauge micropuncture needle. Using standard technique, the initial micro needle was exchanged over a 0.018 micro wire for a transitional 4 [place] micro sheath. The micro sheath was then exchanged over a 0.035 wire for a 5 French vascular sheath. A C2 cobra catheter was advanced over a Bentson wire and used to select the celiac artery. A celiac arteriogram was performed. No evidence of active bleeding. No evidence of replaced colic artery. The C2 cobra catheter was next advanced into the superior mesenteric artery. A arteriogram was performed. There is evidence of active bleeding arising from the terminal branch in the ascending colon as expected from the prior CTA. A Cook can tot a microcatheter was advanced into the ileocolic. An arteriogram was performed. The active bleeding branch does not appear to arise from the distal ileo colic artery. The microcatheter was next advanced into the right colic artery. Arteriography was performed. The hemorrhage is again visualized. Have appears to arise at the anti mesenteric border of the colon interval region of potentially several feeding distal branch arteries. The microcatheter was advanced into the first  potential arterial feeder. Contrast injection confirms active hemorrhage. Coil embolization was performed utilizing a series of 1 and 2 mm low-profile penumbra detachable coils. The microcatheter was brought back. Contrast injection was performed. The embolized vessel is successfully embolized. However, additional bleeding is occurring from a more cephalad branch. The microcatheter was successfully navigated into this branch. Contrast injection was performed confirming active bleeding. Coil embolization was then performed. During the coil embolization there was some extravasation of contrast likely from a coil protruding through the very small arterial wall. Additional coil embolization was performed with successful cessation of hemorrhage. Follow-up contrast injection again performed there is a third tiny distal branch artery still contributing 2 bleeding at the diverticular site. The microcatheter was advanced into this third artery and additional coil embolization was performed. As before, a combination of low-profile penumbra detachable coils in 2 and 3 mm diameters were deployed. Final arteriography demonstrates cessation bleeding. The catheter system was removed. Hemostasis was attained with the assistance of a Celt arterial closure device. IMPRESSION: Successful embolization of distal branches of the right colic artery which were providing blood flow to the hemorrhaging ascending colonic diverticulum. No evidence of continued bleeding at the end of the procedure. Electronically Signed   By: Malachy Moan M.D.   On: 07/04/2023 15:34   IR Angiogram Selective Each Additional Vessel  Result Date: 07/04/2023 INDICATION: 52 year old male on Coumadin with for a prior aortic valve replacement. 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 EXAM: IR EMBO ART VEN HEMORR LYMPH EXTRAV INC GUIDE ROADMAPPING; ADDITIONAL ARTERIOGRAPHY MEDICATIONS: None.  ANESTHESIA/SEDATION: Moderate (conscious) sedation was employed during this procedure. A total of Versed 2 mg and Fentanyl 50 mcg was administered intravenously by radiology nursing. Moderate Sedation Time: 70 minutes. The patient's level of consciousness and vital signs were monitored continuously by radiology nursing throughout the procedure under my direct supervision. CONTRAST:  100 mL Omnipaque 300 FLUOROSCOPY: Radiation Exposure Index (as provided by the fluoroscopic device): 2,922 mGy Kerma COMPLICATIONS: None immediate. PROCEDURE: Informed consent was obtained from the patient following explanation of the procedure, risks, benefits and alternatives. The patient understands, agrees and consents for the procedure. All questions were addressed. A time out was performed prior to the initiation of the procedure. Maximal barrier sterile technique utilized including caps, mask, sterile  gowns, sterile gloves, large sterile drape, hand hygiene, and Betadine prep. The right common femoral artery was interrogated with ultrasound and found to be widely patent. An image was obtained and stored for the medical record. Local anesthesia was attained by infiltration with 1% lidocaine. A small dermatotomy was made. Under real-time sonographic guidance, the vessel was punctured with a 21 gauge micropuncture needle. Using standard technique, the initial micro needle was exchanged over a 0.018 micro wire for a transitional 4 [place] micro sheath. The micro sheath was then exchanged over a 0.035 wire for a 5 French vascular sheath. A C2 cobra catheter was advanced over a Bentson wire and used to select the celiac artery. A celiac arteriogram was performed. No evidence of active bleeding. No evidence of replaced colic artery. The C2 cobra catheter was next advanced into the superior mesenteric artery. A arteriogram was performed. There is evidence of active bleeding arising from the terminal branch in the ascending colon as  expected from the prior CTA. A Cook can tot a microcatheter was advanced into the ileocolic. An arteriogram was performed. The active bleeding branch does not appear to arise from the distal ileo colic artery. The microcatheter was next advanced into the right colic artery. Arteriography was performed. The hemorrhage is again visualized. Have appears to arise at the anti mesenteric border of the colon interval region of potentially several feeding distal branch arteries. The microcatheter was advanced into the first potential arterial feeder. Contrast injection confirms active hemorrhage. Coil embolization was performed utilizing a series of 1 and 2 mm low-profile penumbra detachable coils. The microcatheter was brought back. Contrast injection was performed. The embolized vessel is successfully embolized. However, additional bleeding is occurring from a more cephalad branch. The microcatheter was successfully navigated into this branch. Contrast injection was performed confirming active bleeding. Coil embolization was then performed. During the coil embolization there was some extravasation of contrast likely from a coil protruding through the very small arterial wall. Additional coil embolization was performed with successful cessation of hemorrhage. Follow-up contrast injection again performed there is a third tiny distal branch artery still contributing 2 bleeding at the diverticular site. The microcatheter was advanced into this third artery and additional coil embolization was performed. As before, a combination of low-profile penumbra detachable coils in 2 and 3 mm diameters were deployed. Final arteriography demonstrates cessation bleeding. The catheter system was removed. Hemostasis was attained with the assistance of a Celt arterial closure device. IMPRESSION: Successful embolization of distal branches of the right colic artery which were providing blood flow to the hemorrhaging ascending colonic  diverticulum. No evidence of continued bleeding at the end of the procedure. Electronically Signed   By: Malachy Moan M.D.   On: 07/04/2023 15:34   IR Angiogram Selective Each Additional Vessel  Result Date: 07/04/2023 INDICATION: 52 year old male on Coumadin with for a prior aortic valve replacement. 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 EXAM: IR EMBO ART VEN HEMORR LYMPH EXTRAV INC GUIDE ROADMAPPING; ADDITIONAL ARTERIOGRAPHY MEDICATIONS: None. ANESTHESIA/SEDATION: Moderate (conscious) sedation was employed during this procedure. A total of Versed 2 mg and Fentanyl 50 mcg was administered intravenously by radiology nursing. Moderate Sedation Time: 70 minutes. The patient's level of consciousness and vital signs were monitored continuously by radiology nursing throughout the procedure under my direct supervision. CONTRAST:  100 mL Omnipaque 300 FLUOROSCOPY: Radiation Exposure Index (as provided by the fluoroscopic device): 2,922 mGy Kerma COMPLICATIONS: None immediate. PROCEDURE: Informed  consent was obtained from the patient following explanation of the procedure, risks, benefits and alternatives. The patient understands, agrees and consents for the procedure. All questions were addressed. A time out was performed prior to the initiation of the procedure. Maximal barrier sterile technique utilized including caps, mask, sterile gowns, sterile gloves, large sterile drape, hand hygiene, and Betadine prep. The right common femoral artery was interrogated with ultrasound and found to be widely patent. An image was obtained and stored for the medical record. Local anesthesia was attained by infiltration with 1% lidocaine. A small dermatotomy was made. Under real-time sonographic guidance, the vessel was punctured with a 21 gauge micropuncture needle. Using standard technique, the initial micro needle was exchanged over a 0.018 micro wire for a  transitional 4 [place] micro sheath. The micro sheath was then exchanged over a 0.035 wire for a 5 French vascular sheath. A C2 cobra catheter was advanced over a Bentson wire and used to select the celiac artery. A celiac arteriogram was performed. No evidence of active bleeding. No evidence of replaced colic artery. The C2 cobra catheter was next advanced into the superior mesenteric artery. A arteriogram was performed. There is evidence of active bleeding arising from the terminal branch in the ascending colon as expected from the prior CTA. A Cook can tot a microcatheter was advanced into the ileocolic. An arteriogram was performed. The active bleeding branch does not appear to arise from the distal ileo colic artery. The microcatheter was next advanced into the right colic artery. Arteriography was performed. The hemorrhage is again visualized. Have appears to arise at the anti mesenteric border of the colon interval region of potentially several feeding distal branch arteries. The microcatheter was advanced into the first potential arterial feeder. Contrast injection confirms active hemorrhage. Coil embolization was performed utilizing a series of 1 and 2 mm low-profile penumbra detachable coils. The microcatheter was brought back. Contrast injection was performed. The embolized vessel is successfully embolized. However, additional bleeding is occurring from a more cephalad branch. The microcatheter was successfully navigated into this branch. Contrast injection was performed confirming active bleeding. Coil embolization was then performed. During the coil embolization there was some extravasation of contrast likely from a coil protruding through the very small arterial wall. Additional coil embolization was performed with successful cessation of hemorrhage. Follow-up contrast injection again performed there is a third tiny distal branch artery still contributing 2 bleeding at the diverticular site. The  microcatheter was advanced into this third artery and additional coil embolization was performed. As before, a combination of low-profile penumbra detachable coils in 2 and 3 mm diameters were deployed. Final arteriography demonstrates cessation bleeding. The catheter system was removed. Hemostasis was attained with the assistance of a Celt arterial closure device. IMPRESSION: Successful embolization of distal branches of the right colic artery which were providing blood flow to the hemorrhaging ascending colonic diverticulum. No evidence of continued bleeding at the end of the procedure. Electronically Signed   By: Malachy Moan M.D.   On: 07/04/2023 15:34   IR Angiogram Selective Each Additional Vessel  Result Date: 07/04/2023 INDICATION: 52 year old male on Coumadin with for a prior aortic valve replacement. 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 EXAM: IR EMBO ART VEN HEMORR LYMPH EXTRAV INC GUIDE ROADMAPPING; ADDITIONAL ARTERIOGRAPHY MEDICATIONS: None. ANESTHESIA/SEDATION: Moderate (conscious) sedation was employed during this procedure. A total of Versed 2 mg and Fentanyl 50 mcg was administered intravenously by radiology nursing. Moderate  Sedation Time: 70 minutes. The patient's level of consciousness and vital signs were monitored continuously by radiology nursing throughout the procedure under my direct supervision. CONTRAST:  100 mL Omnipaque 300 FLUOROSCOPY: Radiation Exposure Index (as provided by the fluoroscopic device): 2,922 mGy Kerma COMPLICATIONS: None immediate. PROCEDURE: Informed consent was obtained from the patient following explanation of the procedure, risks, benefits and alternatives. The patient understands, agrees and consents for the procedure. All questions were addressed. A time out was performed prior to the initiation of the procedure. Maximal barrier sterile technique utilized including caps, mask, sterile  gowns, sterile gloves, large sterile drape, hand hygiene, and Betadine prep. The right common femoral artery was interrogated with ultrasound and found to be widely patent. An image was obtained and stored for the medical record. Local anesthesia was attained by infiltration with 1% lidocaine. A small dermatotomy was made. Under real-time sonographic guidance, the vessel was punctured with a 21 gauge micropuncture needle. Using standard technique, the initial micro needle was exchanged over a 0.018 micro wire for a transitional 4 [place] micro sheath. The micro sheath was then exchanged over a 0.035 wire for a 5 French vascular sheath. A C2 cobra catheter was advanced over a Bentson wire and used to select the celiac artery. A celiac arteriogram was performed. No evidence of active bleeding. No evidence of replaced colic artery. The C2 cobra catheter was next advanced into the superior mesenteric artery. A arteriogram was performed. There is evidence of active bleeding arising from the terminal branch in the ascending colon as expected from the prior CTA. A Cook can tot a microcatheter was advanced into the ileocolic. An arteriogram was performed. The active bleeding branch does not appear to arise from the distal ileo colic artery. The microcatheter was next advanced into the right colic artery. Arteriography was performed. The hemorrhage is again visualized. Have appears to arise at the anti mesenteric border of the colon interval region of potentially several feeding distal branch arteries. The microcatheter was advanced into the first potential arterial feeder. Contrast injection confirms active hemorrhage. Coil embolization was performed utilizing a series of 1 and 2 mm low-profile penumbra detachable coils. The microcatheter was brought back. Contrast injection was performed. The embolized vessel is successfully embolized. However, additional bleeding is occurring from a more cephalad branch. The microcatheter  was successfully navigated into this branch. Contrast injection was performed confirming active bleeding. Coil embolization was then performed. During the coil embolization there was some extravasation of contrast likely from a coil protruding through the very small arterial wall. Additional coil embolization was performed with successful cessation of hemorrhage. Follow-up contrast injection again performed there is a third tiny distal branch artery still contributing 2 bleeding at the diverticular site. The microcatheter was advanced into this third artery and additional coil embolization was performed. As before, a combination of low-profile penumbra detachable coils in 2 and 3 mm diameters were deployed. Final arteriography demonstrates cessation bleeding. The catheter system was removed. Hemostasis was attained with the assistance of a Celt arterial closure device. IMPRESSION: Successful embolization of distal branches of the right colic artery which were providing blood flow to the hemorrhaging ascending colonic diverticulum. No evidence of continued bleeding at the end of the procedure. Electronically Signed   By: Malachy Moan M.D.   On: 07/04/2023 15:34   CT ANGIO GI BLEED  Result Date: 07/04/2023 CLINICAL DATA:  Bloody stools since this morning. Patient on Eliquis. Abdominal aortic aneurysm, postop. EXAM: CTA ABDOMEN AND PELVIS WITHOUT AND  WITH CONTRAST TECHNIQUE: Multidetector CT imaging of the abdomen and pelvis was performed using the standard protocol during bolus administration of intravenous contrast. Multiplanar reconstructed images and MIPs were obtained and reviewed to evaluate the vascular anatomy. RADIATION DOSE REDUCTION: This exam was performed according to the departmental dose-optimization program which includes automated exposure control, adjustment of the mA and/or kV according to patient size and/or use of iterative reconstruction technique. CONTRAST:  OMNIPAQUE IOHEXOL 350  MG/ML SOLN COMPARISON:  03/21/2023 FINDINGS: VASCULAR Aorta: Normal caliber aorta without aneurysm, dissection, vasculitis or significant stenosis. Celiac: Patent without evidence of aneurysm, dissection, vasculitis or significant stenosis. SMA: Patent without evidence of aneurysm, dissection, vasculitis or significant stenosis. Renals: Both renal arteries are patent without evidence of aneurysm, dissection, vasculitis, fibromuscular dysplasia or significant stenosis. IMA: Patent without evidence of aneurysm, dissection, vasculitis or significant stenosis. Inflow: Patent without evidence of aneurysm, dissection, vasculitis or significant stenosis. Proximal Outflow: Bilateral common femoral and visualized portions of the superficial and profunda femoral arteries are patent without evidence of aneurysm, dissection, vasculitis or significant stenosis. Veins: No obvious venous abnormality within the limitations of this arterial phase study. Review of the MIP images confirms the above findings. NON-VASCULAR Lower chest: Minimal linear atelectasis/scarring in the lung bases. Median sternotomy wires are present. Partially visualized hardware over the aortic root. Hepatobiliary: Previous cholecystectomy. Subcentimeter cyst over the dome of the liver. Biliary tree is normal. Pancreas: Normal. Spleen: Previous splenectomy. Several small splenic remnants over the left lateral abdomen unchanged. Adrenals/Urinary Tract: Adrenal glands are normal. Kidneys are normal in size without hydronephrosis or nephrolithiasis. Ureters and bladder are unremarkable. Stomach/Bowel: Surgical clips over the proximal stomach. Small bowel is within normal. Appendix is normal. There is significant diverticulosis over the ascending colon as well as the distal descending and sigmoid colon. No definite diverticulitis. The arterial phase images demonstrate hyperdense material within the midportion of the ascending colon with mild pooling within the  dependent portion of the ascending colon as this appears to be originating from a posterior diverticula compatible with acute bleed. The venous phase images again demonstrate this hyperdense material which is more dense and concentrated along the anterior aspect of the ascending colon at the base of several additional diverticula. No evidence of perforation/free peritoneal air. Lymphatic: No adenopathy. Reproductive: Normal. Other: Postsurgical change of the anterior abdominal wall likely from previous hernia repair. Musculoskeletal: Old pelvic fractures. Mild degenerative change of the hips and spine. IMPRESSION: 1. Acute bleed within the midportion of the ascending colon likely originating from a posterior diverticula, although more concentrated adjacent the base of an anterior diverticula in this same region on the delayed images. No evidence of perforation/free peritoneal air. 2. Significant diverticulosis over the ascending colon as well as the distal descending and sigmoid colon. No acute diverticulitis. 3. Previous cholecystectomy and splenectomy. Subcentimeter hepatic cyst. Critical Value/emergent results were called by telephone at the time of interpretation on 07/04/2023 at 1:07 pm to provider Lauren Roemildt, who verbally acknowledged these results. Electronically Signed   By: Elberta Fortis M.D.   On: 07/04/2023 13:09     Assessment and Plan:   Acute lower GI bleeding status post embolization Aortic valve replacement July 2020.  On Coumadin Had embolization on 07/04/2023 and overall seems stable.  Currently on heparin bridge for plans of colonoscopy tomorrow.  I think this is reasonable and do not see any contraindications as long as levels are therapeutic and no signs of acute GI bleed.  Will discuss with MD about any  further recommendations or INR goals.  Postoperative atrial fibrillation Maintaining normal sinus rhythm right now.  Continue p.o. amiodarone 20 mg twice daily until he sees [place] in  follow-up.   Risk Assessment/Risk Scores:   CHA2DS2-VASc Score = 2   This indicates a 2.2% annual risk of stroke. The patient's score is based upon: CHF History: 1 HTN History: 1 Diabetes History: 0 Stroke History: 0 Vascular Disease History: 0 Age Score: 0 Gender Score: 0    For questions or updates, please contact [facility] Please consult [email or web address] for contact info under    Signed, Abagail Kitchens, PA-C  07/07/2023 9:53 AM

## 2023-07-07 NOTE — Progress Notes (Addendum)
Progress Note  Primary GI: Dr. Marina Goodell  LOS: 3 days   Chief Complaint: lower GI bleed   Subjective  Patient lying in bed. Reports no events over night. He has not had bowel movement since yesterday afternoon which was dark black with streaks of red. No abdominal pain or blood per rectum over night or today. No nausea, vomiting. No chest pain. Intermittent SOB and dizziness reported. We discussed plan for colonoscopy tomorrow, all patients questions answered.  No family was present at the time of my evaluation.   Objective   Vital signs in last 24 hours: Temp:  [98.3 F (36.8 C)-99.6 F (37.6 C)] 98.5 F (36.9 C) (09/18 0750) Pulse Rate:  [85-101] 93 (09/18 0200) Resp:  [10-26] 17 (09/18 0200) BP: (122-152)/(66-90) 137/66 (09/18 0000) SpO2:  [94 %-99 %] 97 % (09/18 0200) Weight:  [104.4 kg] 104.4 kg (09/18 0500) Last BM Date : 07/06/23 Last BM recorded by nurses in past 5 days Stool Type: Type 6 (Mushy consistency with ragged edges) (07/06/2023  3:39 PM)  General:   male in no acute distress  Heart:  Regular rate and rhythm; no murmurs Pulm: Clear anteriorly; no wheezing, on Room air Abdomen: soft, nondistended, normal bowel sounds in all quadrants. Nontender without guarding. No organomegaly appreciated. Extremities:  No edema Neurologic:  Alert and  oriented x4;  No focal deficits.  Psych:  Cooperative. Normal mood and affect.  Intake/Output from previous day: 09/17 0701 - 09/18 0700 In: 1 [I.V.:1] Out: 3550 [Urine:3550] Intake/Output this shift: No intake/output data recorded.  Studies/Results: No results found.  Lab Results: Recent Labs    07/05/23 0739 07/05/23 1031 07/06/23 0310 07/06/23 1747 07/07/23 0313  WBC 12.3*  --  13.9*  --  11.9*  HGB 10.4*   < > 9.7* 9.2* 9.2*  HCT 31.6*   < > 29.5* 28.5* 28.0*  PLT 224  --  234  --  239   < > = values in this interval not displayed.   BMET Recent Labs    07/05/23 0739 07/06/23 0310 07/07/23 0313  NA  133* 133* 131*  K 4.0 3.9 3.8  CL 105 101 101  CO2 22 25 24   GLUCOSE 113* 103* 93  BUN 20 15 14   CREATININE 1.48* 1.38* 1.41*  CALCIUM 7.8* 7.9* 8.0*   LFT Recent Labs    07/04/23 1130  PROT 6.6  ALBUMIN 3.6  AST 17  ALT 24  ALKPHOS 56  BILITOT 0.7   PT/INR Recent Labs    07/06/23 0310 07/07/23 0313  LABPROT 28.8* 23.2*  INR 2.7* 2.0*     Scheduled Meds:  amiodarone  200 mg Oral BID   Chlorhexidine Gluconate Cloth  6 each Topical Daily   sodium chloride flush  3 mL Intravenous Q12H   Continuous Infusions:  heparin 1,600 Units/hr (07/07/23 0612)    Patient narrative:   Timothy Browning is a 52 y.o. 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    Impression/Plan:   52 year old male history of paroxysmal A-fib, aortic valve replacement July 2024 (on Coumadin), presented with new onset lower GI bleed and CTA positive for diverticular bleed s/p successful embolization with IR 9/15.   Acute lower GI bleed s/p successful embolization with IR. CTA 9/15 with acute bleed with and midportion of ascending colon from posterior diverticula. No prior colonoscopy. Suspected diverticular bleed. Received  1 PRBCs (9/15).Vital signs stable. No bleeding overnight. Hgb stable 10.3>9.7 >9.2         INR 2.0 BUN 14, creatinine 1.41 -Trend CBC -Daily INR/PT -Clear liquids today, NPO after MN  -Schedule colonoscopy (diagnostic if INR remains > 2.0) on Thursday as inpatient with IV heparin bridge (started this am). Will discontinue heparin 4 hours prior to procedure time. I thoroughly discussed the procedure with the patient (at bedside) to include nature of the procedure, alternatives, benefits, and risks (including but not limited to bleeding, infection, perforation, anesthesia/cardiac pulmonary complications). Pt verbalizes understanding. -Requested Cardiology  evaluation given recent aortic valve. Yvonna Alanis PA from cardiology here to assist patient this am. Informed him of our plan.  ADDENDUM: Spoke with pharmacist Keturah Barre and will hold heparin at 0800 tomorrow 9/19 for procedure at 1215.  Aortic valve replacement July 2024 on Coumadin INR 2.7>2.0/PT 28.8>23.2 -coumadin on hold -heparin bridge therapy started this am  Timothy Browning  07/07/2023, 8:52 AM   Attending Physician Note   I have taken an interval history, reviewed the chart and examined the patient. I performed a substantive portion of this encounter, including complete performance of at least one of the key components, in conjunction with the APP. I agree with the APP's note, impression and recommendations with my edits. My additional impressions and recommendations are as follows.   Acute ascending colon bleed S/P IR coil embolization.  Suspected diverticular bleed.  R/O neoplasm, AVM, etc.  INR has drifted to 2.0.  Trend CBC and INR.  Colonoscopy tomorrow. Bowel prep tonight. IV heparin to be held at least 4 hours prior to the procedure.  Timothy Head, MD [facility] See AMION, [facility], for our on call provider

## 2023-07-07 NOTE — TOC Initial Note (Addendum)
Transition of Care [facility]) - Initial/Assessment Note    Patient Details  Name: Timothy Browning MRN: [id number] Date of Birth: 1970/11/02  Transition of Care [facility]) CM/SW Contact:    Lavenia Atlas, RN Phone Number: 07/07/2023, 2:45 PM  Clinical Narrative:   Per chart review patient currently in [facility] ICU for acute lower GI bleeding, on Eliquis. This RNCM spoke with patient who reports prior to admission patient has CPAP, rollator, AFO leg brace.   Transportation at discharge: family (daughter)  TOC will continue to follow for needs.                  Expected Discharge Plan: Home/Self Care Barriers to Discharge: Continued Medical Work up   Patient Goals and CMS Choice Patient states their goals for this hospitalization and ongoing recovery are:: to return home feeling better CMS Medicare.gov Compare Post Acute Care list provided to:: Patient Choice offered to / list presented to : Patient [place] ownership interest in [facility] [place].provided to:: Patient    Expected Discharge Plan and Services In-house Referral: NA Discharge Planning Services: CM Consult Post Acute Care Choice: NA Living arrangements for the past 2 months: Single Family Home                 DME Arranged: N/A DME Agency: NA       HH Arranged: NA HH Agency: NA        Prior Living Arrangements/Services Living arrangements for the past 2 months: Single Family Home Lives with:: Relatives Patient language and need for interpreter reviewed:: Yes Do you feel safe going back to the place where you live?: Yes      Need for Family Participation in Patient Care: No (Comment) Care giver support system in place?: Yes (comment) Current home services: DME (CPAP, rollator, AFO leg brace) Criminal Activity/Legal Involvement Pertinent to Current Situation/Hospitalization: No - Comment as needed  Activities of Daily Living Home Assistive Devices/Equipment: Brace (specify type) (R AFO for foot drop) ADL  Screening (condition at time of admission) Patient's cognitive ability adequate to safely complete daily activities?: Yes Is the patient deaf or have difficulty hearing?: No Does the patient have difficulty seeing, even when wearing glasses/contacts?: No Does the patient have difficulty concentrating, remembering, or making decisions?: Yes Patient able to express need for assistance with ADLs?: Yes Does the patient have difficulty dressing or bathing?: No Independently performs ADLs?: Yes (appropriate for developmental age) Does the patient have difficulty walking or climbing stairs?: Yes Weakness of Legs: Both Weakness of Arms/Hands: None  Permission Sought/Granted Permission sought to share information with : Case Manager Permission granted to share information with : Yes, Verbal Permission Granted  Share Information with NAME: Case manager           Emotional Assessment Appearance:: Appears stated age Attitude/Demeanor/Rapport: Gracious Affect (typically observed): Accepting Orientation: : Oriented to Self, Oriented to Place, Oriented to  Time, Oriented to Situation Alcohol / Substance Use: Not Applicable Psych Involvement: No (comment)  Admission diagnosis:  Rectal bleeding [K62.5] Acute lower GI bleeding [K92.2] Gastrointestinal hemorrhage associated with intestinal diverticulosis [K57.91] Patient Active Problem List   Diagnosis Date Noted   Anticoagulation management encounter 07/07/2023   Acute lower GI bleeding 07/04/2023   Hematuria 06/02/2023   Hyperlipidemia 06/02/2023   Constipation 06/02/2023   Atrial fibrillation (HCC) 05/17/2023   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   Paroxysmal atrial fibrillation (HCC) 05/12/2023   Acute respiratory failure with hypoxia (HCC) 05/07/2023   Ventilator dependence (HCC)  05/06/2023   Anxiety about health 04/21/2023   Left renal stone 03/25/2023   OSA (obstructive sleep apnea) 01/27/2023   Bilateral ACL tear 12/25/2022   Chronic  back pain 09/24/2022   Fatigue 08/27/2022   Chronic systolic congestive heart failure (HCC) 07/16/2022   Aortic aneurysm without rupture (HCC) 07/14/2022   Dizziness 07/03/2022   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 [place] [address] Phone: [phone] Fax: [phone]  [place] [facility] [address] [place] [address] Phone: [phone] Fax: [phone]  [id number] - [place], [place] - [address] AT [facility] OF [address] [place] [address] Phone: [phone] Fax: [phone]     Social Determinants of Health (SDOH) Social History: SDOH Screenings   Food Insecurity: No Food Insecurity (07/05/2023)  Housing: Patient Declined (07/05/2023)  Transportation Needs: No Transportation Needs (07/05/2023)  Utilities: Not At Risk (07/05/2023)  Depression (PHQ2-9): Low Risk  (06/02/2023)  Social Connections: Unknown (03/19/2023)   Received from [facility]  Tobacco Use: Low Risk  (07/04/2023)   SDOH Interventions:     Readmission Risk Interventions    07/07/2023    2:20 PM  Readmission Risk Prevention Plan  Transportation Screening Complete  PCP or Specialist Appt within 5-7 Days Complete  Home Care Screening Complete  Medication Review (RN CM) Complete

## 2023-07-07 NOTE — Progress Notes (Signed)
ANTICOAGULATION CONSULT NOTE - Follow Up  Pharmacy Consult for heparin Indication:  Warfarin (for afib, mechanical aortic valve) on hold. Heparin bridge for subtherapeutic INR <2.5.   Allergies  Allergen Reactions   Ibuprofen-Acetaminophen Other (See Comments)    Advise by doctor, Due to kidney issues    Patient Measurements: Height: 6' (182.9 cm) Weight: 107.9 kg (237 lb 14 oz) IBW/kg (Calculated) : 77.6 Heparin Dosing Weight: 100 kg  Vital Signs: Temp: 98.3 F (36.8 C) (09/18 0315) Temp Source: Oral (09/18 0315) BP: 137/66 (09/18 0000) Pulse Rate: 93 (09/18 0200)  Labs: Recent Labs    07/05/23 0739 07/05/23 1031 07/05/23 1844 07/06/23 0310 07/06/23 1747 07/07/23 0313  HGB 10.4*   < >  --  9.7* 9.2* 9.2*  HCT 31.6*   < >  --  29.5* 28.5* 28.0*  PLT 224  --   --  234  --  239  LABPROT 28.3*  --  28.4* 28.8*  --  23.2*  INR 2.6*  --  2.6* 2.7*  --  2.0*  CREATININE 1.48*  --   --  1.38*  --  1.41*   < > = values in this interval not displayed.    Estimated Creatinine Clearance: 78.6 mL/min (A) (by C-G formula based on SCr of 1.41 mg/dL (H)).   Medical History: Past Medical History:  Diagnosis Date   Aortic aneurysm without rupture (HCC)    Chest pain of uncertain etiology 08/11/2021   CKD (chronic kidney disease)    GERD (gastroesophageal reflux disease)    Heart failure (HCC)    Hypertension    Obesity (BMI 30-39.9) 08/11/2021   Sleep apnea     Medications: Warfarin PTA for afib, mechanical aortic valve -Last dose: 9/14  Information obtained from [facility] clinic note on 06/29/23: -INR goal: 2.5 - 3.5 -Home dose: warfarin 5 mg PO daily -Note initiation of amiodarone in August  Assessment: Pt is a 53 yoM admitted with rectal bleeding with associated dizziness/lightheadedness. CTA revealed acute bleeding in the ascending colon, diverticular. Pt underwent transfusion and embolization by IR on 9/15. No reversal agents given.   Pt chronically anticoagulated  with warfarin for afib, mechanical aortic valve placed in July 2024. Pharmacy consulted to initiate heparin for bridge therapy once INR is subtherapeutic <2.5.   Today, 07/07/23 INR = 2.0 CBC: Hgb low/decreased, Plt WNL Hematochezia x1 on 9/16  Goal of Therapy:  INR 2.5 - 3.5 Heparin level 0.3 - 0.7 Monitor platelets by anticoagulation protocol: Yes   Plan:  Start heparin drip at 1600 units/hr Heparin level in 6 hours Daily CBC Follow for ability to restart warfarin Monitor for signs of bleeding  Arley Phenix RPh 07/07/2023, 4:41 AM

## 2023-07-07 NOTE — Progress Notes (Addendum)
ANTICOAGULATION CONSULT NOTE - Follow Up  Pharmacy Consult for heparin Indication:  Warfarin (for afib, mechanical aortic valve) on hold. Heparin bridge for subtherapeutic INR <2.5.   Allergies  Allergen Reactions   Ibuprofen-Acetaminophen Other (See Comments)    Advise by doctor, Due to kidney issues    Patient Measurements: Height: 6' (182.9 cm) Weight: 104.4 kg (230 lb 2.6 oz) IBW/kg (Calculated) : 77.6 Heparin Dosing Weight: 100 kg  Vital Signs: Temp: 99.7 F (37.6 C) (09/18 2055) Temp Source: Oral (09/18 2055) BP: 128/92 (09/18 2055) Pulse Rate: 89 (09/18 2055)  Labs: Recent Labs    07/05/23 0739 07/05/23 1031 07/05/23 1844 07/06/23 0310 07/06/23 1747 07/07/23 0313 07/07/23 1213 07/07/23 1951  HGB 10.4*   < >  --  9.7* 9.2* 9.2*  --   --   HCT 31.6*   < >  --  29.5* 28.5* 28.0*  --   --   PLT 224  --   --  234  --  239  --   --   LABPROT 28.3*  --  28.4* 28.8*  --  23.2*  --   --   INR 2.6*  --  2.6* 2.7*  --  2.0*  --   --   HEPARINUNFRC  --   --   --   --   --   --  0.52 0.77*  CREATININE 1.48*  --   --  1.38*  --  1.41*  --   --    < > = values in this interval not displayed.    Estimated Creatinine Clearance: 77.4 mL/min (A) (by C-G formula based on SCr of 1.41 mg/dL (H)).   Medical History: Past Medical History:  Diagnosis Date   Aortic aneurysm without rupture (HCC)    Chest pain of uncertain etiology 08/11/2021   CKD (chronic kidney disease)    GERD (gastroesophageal reflux disease)    Heart failure (HCC)    Hypertension    Obesity (BMI 30-39.9) 08/11/2021   Sleep apnea     Medications: Warfarin PTA for afib, mechanical aortic valve -Last dose: 9/14  Information obtained from [facility] clinic note on 06/29/23: -INR goal: 2.5 - 3.5 -Home dose: warfarin 5 mg PO daily -Note initiation of amiodarone in August  Assessment: Pt is a 20 yoM admitted with rectal bleeding with associated dizziness/lightheadedness. CTA revealed acute bleeding in the  ascending colon, diverticular. Pt underwent transfusion and embolization by IR on 9/15. No reversal agents given.   Pt chronically anticoagulated with warfarin for afib, mechanical aortic valve placed in July 2024. Pharmacy consulted to initiate heparin for bridge therapy once INR is subtherapeutic <2.5.   Today, 07/07/23 INR = 2 was subtherapeutic this morning. Heparin drip initiated. At 1951 Heparin level = 0.77, supratherapeutic on heparin infusion of 1600 units/hr CBC: Hgb low but stable. Plt WNL One dark BM with red streaks reported on 9/17. On 9/18 per 3rd shift RN -- previous day shift nurse reported dark black stool with streaks of red (she is unsure when this occurred in the day).  Currently 3rd shift nurse reports BM is a yellow/brown liquid right now.  Spoke with Anthoney Harada- she wants to continue heparin drip and target lower end of range at this time.  Warfarin remains on hold for colonoscopy on 9/19.   Goal of Therapy:  INR 2.5 - 3.5 Heparin level 0.3 - 0.7 Monitor platelets by anticoagulation protocol: Yes   Plan:  Reduce heparin infusion to 1450 units/hr Obtain  heparin level in 6 hours CBC, heparin level, INR daily Monitor for signs of bleeding  Planning for colonoscopy on 9/19, currently scheduled for 12:15. Will hold heparin @ 0800 tomorrow (4 hours prior to procedure per GI instructions). If any changes to OR schedule or peri-procedural anticoagulation plan, please contact pharmacy.   Josefa Half, PharmD 07/07/2023,9:02 PM

## 2023-07-07 NOTE — H&P (View-Only) (Signed)
Progress Note  Primary GI: Dr. Marina Goodell  LOS: 3 days   Chief Complaint: lower GI bleed   Subjective  Patient lying in bed. Reports no events over night. He has not had bowel movement since yesterday afternoon which was dark black with streaks of red. No abdominal pain or blood per rectum over night or today. No nausea, vomiting. No chest pain. Intermittent SOB and dizziness reported. We discussed plan for colonoscopy tomorrow, all patients questions answered.  No family was present at the time of my evaluation.   Objective   Vital signs in last 24 hours: Temp:  [98.3 F (36.8 C)-99.6 F (37.6 C)] 98.5 F (36.9 C) (09/18 0750) Pulse Rate:  [85-101] 93 (09/18 0200) Resp:  [10-26] 17 (09/18 0200) BP: (122-152)/(66-90) 137/66 (09/18 0000) SpO2:  [94 %-99 %] 97 % (09/18 0200) Weight:  [104.4 kg] 104.4 kg (09/18 0500) Last BM Date : 07/06/23 Last BM recorded by nurses in past 5 days Stool Type: Type 6 (Mushy consistency with ragged edges) (07/06/2023  3:39 PM)  General:   male in no acute distress  Heart:  Regular rate and rhythm; no murmurs Pulm: Clear anteriorly; no wheezing, on Room air Abdomen: soft, nondistended, normal bowel sounds in all quadrants. Nontender without guarding. No organomegaly appreciated. Extremities:  No edema Neurologic:  Alert and  oriented x4;  No focal deficits.  Psych:  Cooperative. Normal mood and affect.  Intake/Output from previous day: 09/17 0701 - 09/18 0700 In: 1 [I.V.:1] Out: 3550 [Urine:3550] Intake/Output this shift: No intake/output data recorded.  Studies/Results: No results found.  Lab Results: Recent Labs    07/05/23 0739 07/05/23 1031 07/06/23 0310 07/06/23 1747 07/07/23 0313  WBC 12.3*  --  13.9*  --  11.9*  HGB 10.4*   < > 9.7* 9.2* 9.2*  HCT 31.6*   < > 29.5* 28.5* 28.0*  PLT 224  --  234  --  239   < > = values in this interval not displayed.   BMET Recent Labs    07/05/23 0739 07/06/23 0310 07/07/23 0313  NA  133* 133* 131*  K 4.0 3.9 3.8  CL 105 101 101  CO2 22 25 24   GLUCOSE 113* 103* 93  BUN 20 15 14   CREATININE 1.48* 1.38* 1.41*  CALCIUM 7.8* 7.9* 8.0*   LFT Recent Labs    07/04/23 1130  PROT 6.6  ALBUMIN 3.6  AST 17  ALT 24  ALKPHOS 56  BILITOT 0.7   PT/INR Recent Labs    07/06/23 0310 07/07/23 0313  LABPROT 28.8* 23.2*  INR 2.7* 2.0*     Scheduled Meds:  amiodarone  200 mg Oral BID   Chlorhexidine Gluconate Cloth  6 each Topical Daily   sodium chloride flush  3 mL Intravenous Q12H   Continuous Infusions:  heparin 1,600 Units/hr (07/07/23 0612)    Patient narrative:   Timothy Browning is a 52 y.o. 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    Impression/Plan:   52 year old male history of paroxysmal A-fib, aortic valve replacement July 2024 (on Coumadin), presented with new onset lower GI bleed and CTA positive for diverticular bleed s/p successful embolization with IR 9/15.   Acute lower GI bleed s/p successful embolization with IR. CTA 9/15 with acute bleed with and midportion of ascending colon from posterior diverticula. No prior colonoscopy. Suspected diverticular bleed. Received  1 PRBCs (9/15).Vital signs stable. No bleeding overnight. Hgb stable 10.3>9.7 >9.2         INR 2.0 BUN 14, creatinine 1.41 -Trend CBC -Daily INR/PT -Clear liquids today, NPO after MN  -Schedule colonoscopy (diagnostic if INR remains > 2.0) on Thursday as inpatient with IV heparin bridge (started this am). Will discontinue heparin 4 hours prior to procedure time. I thoroughly discussed the procedure with the patient (at bedside) to include nature of the procedure, alternatives, benefits, and risks (including but not limited to bleeding, infection, perforation, anesthesia/cardiac pulmonary complications). Pt verbalizes understanding. -Requested Cardiology  evaluation given recent aortic valve. Yvonna Alanis PA from cardiology here to assist patient this am. Informed him of our plan.  ADDENDUM: Spoke with pharmacist Keturah Barre and will hold heparin at 0800 tomorrow 9/19 for procedure at 1215.  Aortic valve replacement July 2024 on Coumadin INR 2.7>2.0/PT 28.8>23.2 -coumadin on hold -heparin bridge therapy started this am  Deanna J May  07/07/2023, 8:52 AM   Attending Physician Note   I have taken an interval history, reviewed the chart and examined the patient. I performed a substantive portion of this encounter, including complete performance of at least one of the key components, in conjunction with the APP. I agree with the APP's note, impression and recommendations with my edits. My additional impressions and recommendations are as follows.   Acute ascending colon bleed S/P IR coil embolization.  Suspected diverticular bleed.  R/O neoplasm, AVM, etc.  INR has drifted to 2.0.  Trend CBC and INR.  Colonoscopy tomorrow. Bowel prep tonight. IV heparin to be held at least 4 hours prior to the procedure.  Claudette Head, MD [facility] See AMION, [facility], for our on call provider

## 2023-07-07 NOTE — Progress Notes (Signed)
   07/07/23 2011  BiPAP/CPAP/SIPAP  BiPAP/CPAP/SIPAP Pt Type Adult  BiPAP/CPAP/SIPAP Resmed (from home)  Mask Type Full face mask (from home)  FiO2 (%) 21 %  Heater Temperature  (Sterile water supplied)  Patient Home Equipment Yes  Auto Titrate Yes (14-17)  Safety Check Completed by RT for Home Unit Yes, no issues noted (Patient uses bandaids at home over the bridge of his nose to prevent breakdown. Bandaids supplied from stock.)  BiPAP/CPAP /SiPAP Vitals  Pulse Rate 94  Resp 17  SpO2 98 %  Bilateral Breath Sounds Clear  MEWS Score/Color  MEWS Score 1  MEWS Score Color Green

## 2023-07-07 NOTE — Progress Notes (Signed)
   07/07/23 0100  BiPAP/CPAP/SIPAP  BiPAP/CPAP/SIPAP Pt Type Adult  Reason BIPAP/CPAP not in use Non-compliant (Patient states that his wife is bringing his CPAP in from home and he will sleep without it tonight while he waits.)

## 2023-07-07 NOTE — Plan of Care (Signed)
  Problem: Education: Goal: Will demonstrate proper wound care and an understanding of methods to prevent future damage Outcome: Progressing Goal: Knowledge of disease or condition will improve Outcome: Progressing Goal: Knowledge of the prescribed therapeutic regimen will improve Outcome: Progressing Goal: Individualized Educational Video(s) Outcome: Progressing   Problem: Activity: Goal: Risk for activity intolerance will decrease Outcome: Progressing   Problem: Cardiac: Goal: Will achieve and/or maintain hemodynamic stability Outcome: Progressing   Problem: Clinical Measurements: Goal: Postoperative complications will be avoided or minimized Outcome: Progressing   Problem: Respiratory: Goal: Respiratory status will improve Outcome: Progressing   Problem: Skin Integrity: Goal: Wound healing without signs and symptoms of infection Outcome: Progressing Goal: Risk for impaired skin integrity will decrease Outcome: Progressing   Problem: Urinary Elimination: Goal: Ability to achieve and maintain adequate renal perfusion and functioning will improve Outcome: Progressing   Problem: Education: Goal: Understanding of CV disease, CV risk reduction, and recovery process will improve Outcome: Progressing Goal: Individualized Educational Video(s) Outcome: Progressing   Problem: Activity: Goal: Ability to return to baseline activity level will improve Outcome: Progressing   Problem: Cardiovascular: Goal: Ability to achieve and maintain adequate cardiovascular perfusion will improve Outcome: Progressing Goal: Vascular access site(s) Level 0-1 will be maintained Outcome: Progressing   Problem: Health Behavior/Discharge Planning: Goal: Ability to safely manage health-related needs after discharge will improve Outcome: Progressing   Problem: Education: Goal: Knowledge of General Education information will improve Description: Including pain rating scale, medication(s)/side  effects and non-pharmacologic comfort measures Outcome: Progressing   Problem: Health Behavior/Discharge Planning: Goal: Ability to manage health-related needs will improve Outcome: Progressing   Problem: Clinical Measurements: Goal: Ability to maintain clinical measurements within normal limits will improve Outcome: Progressing Goal: Will remain free from infection Outcome: Progressing Goal: Diagnostic test results will improve Outcome: Progressing Goal: Respiratory complications will improve Outcome: Progressing Goal: Cardiovascular complication will be avoided Outcome: Progressing   Problem: Activity: Goal: Risk for activity intolerance will decrease Outcome: Progressing   Problem: Nutrition: Goal: Adequate nutrition will be maintained Outcome: Progressing   Problem: Coping: Goal: Level of anxiety will decrease Outcome: Progressing   Problem: Elimination: Goal: Will not experience complications related to bowel motility Outcome: Progressing Goal: Will not experience complications related to urinary retention Outcome: Progressing   Problem: Pain Managment: Goal: General experience of comfort will improve Outcome: Progressing   Problem: Safety: Goal: Ability to remain free from injury will improve Outcome: Progressing   Problem: Skin Integrity: Goal: Risk for impaired skin integrity will decrease Outcome: Progressing   Problem: Education: Goal: Understanding of CV disease, CV risk reduction, and recovery process will improve Outcome: Progressing Goal: Individualized Educational Video(s) Outcome: Progressing   Problem: Activity: Goal: Ability to return to baseline activity level will improve Outcome: Progressing   Problem: Cardiovascular: Goal: Ability to achieve and maintain adequate cardiovascular perfusion will improve Outcome: Progressing Goal: Vascular access site(s) Level 0-1 will be maintained Outcome: Progressing   Problem: Health  Behavior/Discharge Planning: Goal: Ability to safely manage health-related needs after discharge will improve Outcome: Progressing

## 2023-07-07 NOTE — Progress Notes (Signed)
ANTICOAGULATION CONSULT NOTE - Follow Up  Pharmacy Consult for heparin Indication:  Warfarin (for afib, mechanical aortic valve) on hold. Heparin bridge for subtherapeutic INR <2.5.   Allergies  Allergen Reactions   Ibuprofen-Acetaminophen Other (See Comments)    Advise by doctor, Due to kidney issues    Patient Measurements: Height: 6' (182.9 cm) Weight: 104.4 kg (230 lb 2.6 oz) IBW/kg (Calculated) : 77.6 Heparin Dosing Weight: 100 kg  Vital Signs: Temp: 98.5 F (36.9 C) (09/18 0750) Temp Source: Oral (09/18 0750) BP: 114/62 (09/18 0800) Pulse Rate: 84 (09/18 0900)  Labs: Recent Labs    07/05/23 0739 07/05/23 1031 07/05/23 1844 07/06/23 0310 07/06/23 1747 07/07/23 0313  HGB 10.4*   < >  --  9.7* 9.2* 9.2*  HCT 31.6*   < >  --  29.5* 28.5* 28.0*  PLT 224  --   --  234  --  239  LABPROT 28.3*  --  28.4* 28.8*  --  23.2*  INR 2.6*  --  2.6* 2.7*  --  2.0*  CREATININE 1.48*  --   --  1.38*  --  1.41*   < > = values in this interval not displayed.    Estimated Creatinine Clearance: 77.4 mL/min (A) (by C-G formula based on SCr of 1.41 mg/dL (H)).   Medical History: Past Medical History:  Diagnosis Date   Aortic aneurysm without rupture (HCC)    Chest pain of uncertain etiology 08/11/2021   CKD (chronic kidney disease)    GERD (gastroesophageal reflux disease)    Heart failure (HCC)    Hypertension    Obesity (BMI 30-39.9) 08/11/2021   Sleep apnea     Medications: Warfarin PTA for afib, mechanical aortic valve -Last dose: 9/14  Information obtained from [facility] clinic note on 06/29/23: -INR goal: 2.5 - 3.5 -Home dose: warfarin 5 mg PO daily -Note initiation of amiodarone in August  Assessment: Pt is a 51 yoM admitted with rectal bleeding with associated dizziness/lightheadedness. CTA revealed acute bleeding in the ascending colon, diverticular. Pt underwent transfusion and embolization by IR on 9/15. No reversal agents given.   Pt chronically anticoagulated  with warfarin for afib, mechanical aortic valve placed in July 2024. Pharmacy consulted to initiate heparin for bridge therapy once INR is subtherapeutic <2.5.   Today, 07/07/23 INR = 2 was subtherapeutic this morning. Heparin drip initiated. Heparin level = 0.52 is therapeutic on heparin infusion of 1600 units/hr CBC: Hgb low but stable. Plt WNL One dark BM with red streaks reported on 9/17. Confirmed with RN - no current signs of bleeding.  Warfarin remains on hold for colonoscopy on 9/19.   Goal of Therapy:  INR 2.5 - 3.5 Heparin level 0.3 - 0.7 Monitor platelets by anticoagulation protocol: Yes   Plan:  Continue heparin infusion at 1600 units/hr Check confirmatory heparin level in 6 hours CBC, heparin level, INR daily Monitor for signs of bleeding  Planning for colonoscopy on 9/19, currently scheduled for 12:15. Will hold heparin @ 0800 tomorrow (4 hours prior to procedure per GI instructions). If any changes to OR schedule or peri-procedural anticoagulation plan, please contact pharmacy.   Cindi Carbon, PharmD 07/07/2023,10:31 AM

## 2023-07-07 NOTE — Plan of Care (Signed)
  Problem: Activity: Goal: Risk for activity intolerance will decrease Outcome: Progressing   Problem: Cardiac: Goal: Will achieve and/or maintain hemodynamic stability Outcome: Progressing   Problem: Clinical Measurements: Goal: Postoperative complications will be avoided or minimized Outcome: Progressing   Problem: Activity: Goal: Ability to return to baseline activity level will improve Outcome: Progressing   Problem: Cardiovascular: Goal: Ability to achieve and maintain adequate cardiovascular perfusion will improve Outcome: Progressing   Problem: Activity: Goal: Ability to return to baseline activity level will improve Outcome: Progressing   Problem: Respiratory: Goal: Respiratory status will improve Outcome: Completed/Met   Problem: Cardiovascular: Goal: Vascular access site(s) Level 0-1 will be maintained Outcome: Completed/Met

## 2023-07-07 NOTE — Care Management (Deleted)
Transition of Care [facility]) - Inpatient Brief Assessment   Patient Details  Name: KAMARII MONIGOLD MRN: [id number] Date of Birth: December 14, 1970  Transition of Care [facility]) CM/SW Contact:    Lavenia Atlas, RN Phone Number: 07/07/2023, 2:23 PM   Clinical Narrative: Per chart review patient currently in [facility] ICU for acute lower GI bleeding. GI following.  TOC will follow for discharge needs    Transition of Care Asessment: Insurance and Status: Insurance coverage has been reviewed Patient has primary care physician: Yes Home environment has been reviewed: from home with family Prior level of function:: independent Prior/Current Home Services: No current home services Social Determinants of Health Reivew: SDOH reviewed no interventions necessary Readmission risk has been reviewed: Yes Transition of care needs: no transition of care needs at this time

## 2023-07-07 NOTE — Progress Notes (Signed)
PROGRESS NOTE Timothy Browning  XBJ:[id number] DOB: 20-Dec-1970 DOA: 07/04/2023 PCP: Marrianne Mood, MD  Brief Narrative/Hospital Course: Timothy Browning is a 52 y.o. male with a history of hypertension, CKD stage IIIa, paroxysmal atrial fibrillation, aortic aneurysm status post Bentall procedure and status post aortic valve replacement with mechanical valve on Coumadin.  Patient presented secondary to rectal bleeding and lightheadedness and was found to have evidence of hematochezia with hypotension  CT angio GI bleed protocol>>showed acute bleed located in the ascending colon secondary to diverticular bleeding. IR performed emergent angiogram with successful localization and embolization of ascending colonic diverticula GI is following.  Since embolization had 1 episode of dark red to stool 9/16 and dark black formed stool 9/17 with a streak of blood. Received 1 unit PRBC 9/15 and Coumadin has been held since 9/15.    Subjective: Patient seen and examined this morning Denies any bowel movement no nausea vomiting chest pain fever chills   Assessment and Plan: Principal Problem:   Acute lower GI bleeding Active Problems:   Stage 3 chronic kidney disease (HCC)   OSA (obstructive sleep apnea)   Paroxysmal atrial fibrillation (HCC)   Acute lower GI bleed likely diverticular bleed: Status post successful IR coil embolization emergently.  GI following -noted possible colonoscopy being considered, INR subtherapeutic.  Patient has been started await GI input today.  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  Continue home amiodarone.Will request cardiology consult.  INR 2.0-heparin drip started.  Pharmacy managing. Recent Labs  Lab 07/05/23 0317 07/05/23 0739 07/05/23 1844 07/06/23 0310 07/07/23 0313  INR  2.7* 2.6* 2.6* 2.7* 2.0*    ABLA: So far 1 unit prbc 9/15.  Hemoglobin is stable in 9, monitor serially. Recent Labs  Lab 07/05/23 0739 07/05/23 1031 07/06/23 0310 07/06/23 1747 07/07/23 0313  HGB 10.4* 10.3* 9.7* 9.2* 9.2*  HCT 31.6* 30.8* 29.5* 28.5* 28.0*    Hypotension: Likely in the setting of ABLA, BP currently well-controlled was given IV fluids with blood initially.  Monitor  Fever up to 100.4 on 9/16: Afebrile since, had mild leukocytosis.  But procalcitonin -9/17.Monitoring without antibiotics.  CKD stage IIIa: Monitor renal function Recent Labs    05/12/23 0355 05/13/23 0049 05/14/23 0059 05/28/23 1135 05/29/23 1655 07/04/23 1130 07/05/23 0317 07/05/23 0739 07/06/23 0310 07/07/23 0313  BUN 17 15 18 15 20 20 18 20 15 14   CREATININE 1.11 1.33* 1.36* 1.57* 1.40* 1.53* 1.31* 1.48* 1.38* 1.41*  CO2 26 28 27 25 24 23 22 22 25  24  K 4.9 4.0 3.9 4.7 3.7 3.7 4.2 4.0 3.9 3.8    Mild hyponatremia: Monitor  OSA: cont CPAP nightly  Class I Obesity:Patient's Body mass index is 31.22 kg/m. : Will benefit with PCP follow-up, weight loss  healthy lifestyle   DVT prophylaxis: SCDs Start: 07/04/23 1540 Code Status:   Code Status: Full Code Family Communication: plan of care discussed with patient  at bedside. Patient status is: Inpatient because of GI bleeding Level of care: Progressive   Dispo: The patient is from: Home lives with family            Anticipated disposition: tbd  Objective: Vitals last 24 hrs: Vitals:   07/07/23 0700 07/07/23 0750 07/07/23 0800 07/07/23 0900  BP:   114/62   Pulse: 81  80  84  Resp: 19  20 13   Temp:  98.5 F (36.9 C)    TempSrc:  Oral    SpO2: 98%  96% 96%  Weight:      Height:       Weight change: -3.5 kg  Physical Examination: General exam: alert awake, older than stated age HEENT:Oral mucosa moist, Ear/Nose WNL grossly Respiratory system: bilaterally clear BS, no use of accessory muscle Cardiovascular system: S1 &  S2 +, No JVD. Gastrointestinal system: Abdomen soft,NT,ND, BS+ Nervous System:Alert, awake, moving extremities. Extremities: LE edema neg,distal peripheral pulses palpable.  Skin: No rashes,no icterus. MSK: Normal muscle bulk,tone, power  Medications reviewed:  Scheduled Meds:  amiodarone  200 mg Oral BID   Chlorhexidine Gluconate Cloth  6 each Topical Daily   sodium chloride flush  3 mL Intravenous Q12H   Continuous Infusions:  heparin 1,600 Units/hr (07/07/23 0612)    Diet Order             Diet NPO time specified Except for: Sips with Meds  Diet effective midnight           Diet clear liquid Room service appropriate? Yes; Fluid consistency: Thin  Diet effective midnight                  Intake/Output Summary (Last 24 hours) at 07/07/2023 1020 Last data filed at 07/07/2023 0924 Gross per 24 hour  Intake 1.02 ml  Output 3800 ml  Net -3798.98 ml   Net IO Since Admission: -3,693.57 mL [07/07/23 1020]  Wt Readings from Last 3 Encounters:  07/07/23 104.4 kg  06/25/23 104.8 kg  06/02/23 103.1 kg     Unresulted Labs (From admission, onward)     Start     Ordered   07/06/23 0500  Protime-INR  Daily,   R     Question:  Specimen collection method  Answer:  Lab=Lab collect   07/05/23 1138   07/05/23 0652  CBC  Daily,   R     Question:  Specimen collection method  Answer:  Lab=Lab collect   07/05/23 0651          Data Reviewed: I have personally reviewed following labs and imaging studies CBC: Recent Labs  Lab 07/04/23 1130 07/04/23 2152 07/05/23 0739 07/05/23 1031 07/06/23 0310 07/06/23 1747 07/07/23 0313  WBC 4.3  --  12.3*  --  13.9*  --  11.9*  HGB 13.3   < > 10.4* 10.3* 9.7* 9.2* 9.2*  HCT 41.2   < > 31.6* 30.8* 29.5* 28.5* 28.0*  MCV 94.3  --  92.1  --  94.2  --  92.4  PLT 319  --  224  --  234  --  239   < > = values in this interval not displayed.   Basic Metabolic Panel: Recent Labs  Lab 07/04/23 1130 07/05/23 0317 07/05/23 0739  07/06/23 0310 07/07/23 0313  NA 134* 134* 133* 133* 131*  K 3.7 4.2 4.0 3.9 3.8  CL 104 105 105 101 101  CO2 23 22 22 25 24   GLUCOSE 126* 127* 113* 103* 93  BUN 20 18 20 15 14   CREATININE 1.53* 1.31* 1.48* 1.38* 1.41*  CALCIUM 8.4* 7.9* 7.8* 7.9* 8.0*   GFR: Estimated Creatinine Clearance: 77.4 mL/min (A) (by C-G formula based on SCr of 1.41 mg/dL (H)). Liver Function Tests: Recent Labs  Lab 07/04/23 1130  AST 17  ALT 24  ALKPHOS 56  BILITOT 0.7  PROT 6.6  ALBUMIN 3.6  Recent Labs  Lab 07/05/23 0317 07/05/23 0739 07/05/23 1844 07/06/23 0310 07/07/23 0313  INR 2.7* 2.6* 2.6* 2.7* 2.0*  CBG: Recent Labs  Lab 07/04/23 1140  GLUCAP 126*   Recent Labs  Lab 07/06/23 0309  PROCALCITON <0.10   Recent Results (from the past 240 hour(s))  MRSA Next Gen by PCR, Nasal     Status: None   Collection Time: 07/04/23  4:46 PM   Specimen: Nasal Mucosa; Nasal Swab  Result Value Ref Range Status   MRSA by PCR Next Gen NOT DETECTED NOT DETECTED Final    Comment: (NOTE) The GeneXpert MRSA Assay (FDA approved for NASAL specimens only), is one component of a comprehensive MRSA colonization surveillance program. It is not intended to diagnose MRSA infection nor to guide or monitor treatment for MRSA infections. Test performance is not FDA approved in patients less than 29 years old. Performed at [facility], [address] [place], [place] [address]     Antimicrobials: Anti-infectives (From admission, onward)    None      Culture/Microbiology    Component Value Date/Time   SDES ABSCESS RIGHT BUTTOCKS 11/21/2007 1301   SPECREQUEST NONE 11/21/2007 1301   CULT  11/21/2007 1301    MODERATE METHICILLIN RESISTANT STAPHYLOCOCCUS AUREUS Note: RIFAMPIN AND GENTAMICIN SHOULD NOT BE USED AS SINGLE DRUGS FOR TREATMENT OF STAPH INFECTIONS. This organism DOES NOT demonstrate inducible Clindamycin resistance in vitro. CRITICAL RESULT CALLED TO, READ BACK BY AND  VERIFIED WITH: S FOX 11/24/07 0900  BY J CRADDOCK   REPTSTATUS 11/24/2007 FINAL 11/21/2007 1301    Radiology Studies: No results found.   LOS: 3 days   Lanae Boast, MD [facility]  07/07/2023, 10:20 AM

## 2023-07-08 ENCOUNTER — Encounter (HOSPITAL_COMMUNITY): Payer: Self-pay | Admitting: Family Medicine

## 2023-07-08 ENCOUNTER — Inpatient Hospital Stay (HOSPITAL_COMMUNITY): Payer: Medicaid Other | Admitting: Registered Nurse

## 2023-07-08 ENCOUNTER — Encounter (HOSPITAL_COMMUNITY)
Admission: EM | Disposition: A | Discharge status: Home / Self Care | Payer: Self-pay | Source: Home / Self Care | Attending: Internal Medicine

## 2023-07-08 DIAGNOSIS — E785 Hyperlipidemia, unspecified: Secondary | ICD-10-CM | POA: Diagnosis not present

## 2023-07-08 DIAGNOSIS — D120 Benign neoplasm of cecum: Secondary | ICD-10-CM

## 2023-07-08 DIAGNOSIS — I4891 Unspecified atrial fibrillation: Secondary | ICD-10-CM

## 2023-07-08 DIAGNOSIS — D125 Benign neoplasm of sigmoid colon: Secondary | ICD-10-CM

## 2023-07-08 DIAGNOSIS — K922 Gastrointestinal hemorrhage, unspecified: Secondary | ICD-10-CM | POA: Diagnosis not present

## 2023-07-08 DIAGNOSIS — K633 Ulcer of intestine: Secondary | ICD-10-CM | POA: Diagnosis not present

## 2023-07-08 DIAGNOSIS — D124 Benign neoplasm of descending colon: Secondary | ICD-10-CM | POA: Diagnosis not present

## 2023-07-08 DIAGNOSIS — K640 First degree hemorrhoids: Secondary | ICD-10-CM | POA: Diagnosis not present

## 2023-07-08 DIAGNOSIS — K921 Melena: Secondary | ICD-10-CM

## 2023-07-08 DIAGNOSIS — N189 Chronic kidney disease, unspecified: Secondary | ICD-10-CM | POA: Diagnosis not present

## 2023-07-08 DIAGNOSIS — D62 Acute posthemorrhagic anemia: Secondary | ICD-10-CM | POA: Diagnosis not present

## 2023-07-08 DIAGNOSIS — K5792 Diverticulitis of intestine, part unspecified, without perforation or abscess without bleeding: Secondary | ICD-10-CM | POA: Diagnosis not present

## 2023-07-08 DIAGNOSIS — I130 Hypertensive heart and chronic kidney disease with heart failure and stage 1 through stage 4 chronic kidney disease, or unspecified chronic kidney disease: Secondary | ICD-10-CM | POA: Diagnosis not present

## 2023-07-08 DIAGNOSIS — I5022 Chronic systolic (congestive) heart failure: Secondary | ICD-10-CM | POA: Diagnosis not present

## 2023-07-08 DIAGNOSIS — G4733 Obstructive sleep apnea (adult) (pediatric): Secondary | ICD-10-CM | POA: Diagnosis not present

## 2023-07-08 DIAGNOSIS — Z7901 Long term (current) use of anticoagulants: Secondary | ICD-10-CM | POA: Diagnosis not present

## 2023-07-08 DIAGNOSIS — K635 Polyp of colon: Secondary | ICD-10-CM | POA: Diagnosis not present

## 2023-07-08 HISTORY — DX: Benign neoplasm of cecum: D12.0

## 2023-07-08 HISTORY — PX: COLONOSCOPY WITH PROPOFOL: SHX5780

## 2023-07-08 HISTORY — PX: POLYPECTOMY: SHX5525

## 2023-07-08 HISTORY — DX: Benign neoplasm of sigmoid colon: D12.5

## 2023-07-08 HISTORY — DX: Ulcer of intestine: K63.3

## 2023-07-08 HISTORY — PX: BIOPSY: SHX5522

## 2023-07-08 HISTORY — DX: Benign neoplasm of descending colon: D12.4

## 2023-07-08 LAB — CBC
HCT: 29.2 % — ABNORMAL LOW (ref 39.0–52.0)
Hemoglobin: 9.4 g/dL — ABNORMAL LOW (ref 13.0–17.0)
MCH: 30 pg (ref 26.0–34.0)
MCHC: 32.2 g/dL (ref 30.0–36.0)
MCV: 93.3 fL (ref 80.0–100.0)
Platelets: 264 10*3/uL (ref 150–400)
RBC: 3.13 MIL/uL — ABNORMAL LOW (ref 4.22–5.81)
RDW: 14.3 % (ref 11.5–15.5)
WBC: 9.1 10*3/uL (ref 4.0–10.5)
nRBC: 0 % (ref 0.0–0.2)

## 2023-07-08 LAB — PROTIME-INR
INR: 1.7 — ABNORMAL HIGH (ref 0.8–1.2)
Prothrombin Time: 20.2 seconds — ABNORMAL HIGH (ref 11.4–15.2)

## 2023-07-08 LAB — BASIC METABOLIC PANEL
Anion gap: 7 (ref 5–15)
BUN: 10 mg/dL (ref 6–20)
CO2: 22 mmol/L (ref 22–32)
Calcium: 8 mg/dL — ABNORMAL LOW (ref 8.9–10.3)
Chloride: 100 mmol/L (ref 98–111)
Creatinine, Ser: 1.13 mg/dL (ref 0.61–1.24)
GFR, Estimated: >60 mL/min (ref >60)
Glucose, Bld: 100 mg/dL — ABNORMAL HIGH (ref 70–99)
Potassium: 3.6 mmol/L (ref 3.5–5.1)
Sodium: 129 mmol/L — ABNORMAL LOW (ref 135–145)

## 2023-07-08 LAB — HEPARIN LEVEL (UNFRACTIONATED): Heparin Unfractionated: 0.93 IU/mL — ABNORMAL HIGH (ref 0.30–0.70)

## 2023-07-08 SURGERY — COLONOSCOPY WITH PROPOFOL
Anesthesia: Monitor Anesthesia Care

## 2023-07-08 MED ORDER — PROPOFOL 1000 MG/100ML IV EMUL
INTRAVENOUS | Status: AC
  Filled 2023-07-08: qty 100

## 2023-07-08 MED ORDER — LACTATED RINGERS IV SOLN: INTRAVENOUS | Status: DC

## 2023-07-08 MED ORDER — PROPOFOL 10 MG/ML IV BOLUS
INTRAVENOUS | Status: DC | PRN
  Administered 2023-07-08: 20 mg via INTRAVENOUS

## 2023-07-08 MED ORDER — PROPOFOL 500 MG/50ML IV EMUL
INTRAVENOUS | Status: DC | PRN
  Administered 2023-07-08: 140 ug/kg/min via INTRAVENOUS

## 2023-07-08 MED ORDER — HEPARIN (PORCINE) 25000 UT/250ML-% IV SOLN
1300.0000 [IU]/h | INTRAVENOUS | Status: DC
  Administered 2023-07-09: 1300 [IU]/h via INTRAVENOUS
  Filled 2023-07-08: qty 250

## 2023-07-08 SURGICAL SUPPLY — 22 items
ELECT REM PT RETURN 9FT ADLT (ELECTROSURGICAL) IMPLANT
ELECTRODE REM PT RTRN 9FT ADLT (ELECTROSURGICAL) IMPLANT
FCP BXJMBJMB 240X2.8X (CUTTING FORCEPS)
FLOOR PAD 36X40 (MISCELLANEOUS) ×2 IMPLANT
FORCEPS BIOP RAD 4 LRG CAP 4 (CUTTING FORCEPS) IMPLANT
FORCEPS BIOP RJ4 240 W/NDL (CUTTING FORCEPS) IMPLANT
FORCEPS BXJMBJMB 240X2.8X (CUTTING FORCEPS) IMPLANT
INJECTOR/SNARE I SNARE (MISCELLANEOUS) IMPLANT
LUBRICANT JELLY 4.5OZ STERILE (MISCELLANEOUS) IMPLANT
MANIFOLD NEPTUNE II (INSTRUMENTS) IMPLANT
NDL SCLEROTHERAPY 25GX240 (NEEDLE) IMPLANT
NEEDLE SCLEROTHERAPY 25GX240 (NEEDLE) IMPLANT
PAD FLOOR 36X40 (MISCELLANEOUS) ×2 IMPLANT
PROBE APC STR FIRE (PROBE) IMPLANT
PROBE INJECTION GOLD (MISCELLANEOUS)
PROBE INJECTION GOLD 7FR (MISCELLANEOUS) IMPLANT
SNARE ROTATE MED OVAL 20MM (MISCELLANEOUS) IMPLANT
SYR 50ML LL SCALE MARK (SYRINGE) IMPLANT
TRAP SPECIMEN MUCOUS 40CC (MISCELLANEOUS) IMPLANT
TUBING ENDO SMARTCAP PENTAX (MISCELLANEOUS) IMPLANT
TUBING IRRIGATION ENDOGATOR (MISCELLANEOUS) ×2 IMPLANT
WATER STERILE IRR 1000ML POUR (IV SOLUTION) IMPLANT

## 2023-07-08 NOTE — Transfer of Care (Signed)
Immediate Anesthesia Transfer of Care Note  Patient: Timothy Browning  Procedure(s) Performed: COLONOSCOPY WITH PROPOFOL POLYPECTOMY BIOPSY  Patient Location: PACU and Endoscopy Unit  Anesthesia Type:MAC  Level of Consciousness: sedated and patient cooperative  Airway & Oxygen Therapy: Patient Spontanous Breathing and Patient connected to face mask oxygen  Post-op Assessment: Report given to RN, Post -op Vital signs reviewed and stable, and Patient moving all extremities  Post vital signs: Reviewed and stable  Last Vitals:  Vitals Value Taken Time  BP    Temp    Pulse    Resp    SpO2      Last Pain:  Vitals:   07/08/23 1140  TempSrc: Temporal  PainSc: 0-No pain      Patients Stated Pain Goal: 0 (07/05/23 0800)  Complications: No notable events documented.

## 2023-07-08 NOTE — Anesthesia Postprocedure Evaluation (Signed)
Anesthesia Post Note  Patient: Timothy Browning  Procedure(s) Performed: COLONOSCOPY WITH PROPOFOL POLYPECTOMY BIOPSY     Patient location during evaluation: PACU Anesthesia Type: MAC Level of consciousness: awake and alert Pain management: pain level controlled Vital Signs Assessment: post-procedure vital signs reviewed and stable Respiratory status: spontaneous breathing, nonlabored ventilation and respiratory function stable Cardiovascular status: stable and blood pressure returned to baseline Anesthetic complications: no   No notable events documented.  Last Vitals:  Vitals:   07/08/23 1320 07/08/23 1348  BP: (!) 148/73 132/77  Pulse: 82 73  Resp: (!) 26 18  Temp:  36.5 C  SpO2: 98% 100%    Last Pain:  Vitals:   07/08/23 1348  TempSrc: Oral  PainSc:                  [name]

## 2023-07-08 NOTE — Interval H&P Note (Signed)
History and Physical Interval Note:  07/08/2023 9:31 AM  Timothy Browning  has presented today for surgery, with the diagnosis of Diverticular bleed, anemia.  The various methods of treatment have been discussed with the patient and family. After consideration of risks, benefits and other options for treatment, the patient has consented to  Procedure(s): COLONOSCOPY WITH PROPOFOL (N/A) as a surgical intervention.  The patient's history has been reviewed, patient examined, no change in status, stable for surgery.  I have reviewed the patient's chart and labs.  Questions were answered to the patient's satisfaction.     Venita Lick. Russella Dar

## 2023-07-08 NOTE — Progress Notes (Signed)
ANTICOAGULATION CONSULT NOTE - Follow Up  Pharmacy Consult for heparin Indication:  Warfarin (for afib, mechanical aortic valve) on hold. Heparin bridge for subtherapeutic INR <2.5.   Allergies  Allergen Reactions   Ibuprofen-Acetaminophen Other (See Comments)    Advise by doctor, Due to kidney issues    Patient Measurements: Height: 6' (182.9 cm) Weight: 104.4 kg (230 lb 2.6 oz) IBW/kg (Calculated) : 77.6 Heparin Dosing Weight: 100 kg  Vital Signs: Temp: 99.7 F (37.6 C) (09/18 2055) Temp Source: Oral (09/18 2055) BP: 128/92 (09/18 2055) Pulse Rate: 89 (09/18 2055)  Labs: Recent Labs    07/05/23 0739 07/05/23 1031 07/06/23 0310 07/06/23 1747 07/07/23 0313 07/07/23 1213 07/07/23 1951 07/08/23 0410  HGB 10.4*   < > 9.7* 9.2* 9.2*  --   --  9.4*  HCT 31.6*   < > 29.5* 28.5* 28.0*  --   --  29.2*  PLT 224  --  234  --  239  --   --  264  LABPROT 28.3*   < > 28.8*  --  23.2*  --   --  20.2*  INR 2.6*   < > 2.7*  --  2.0*  --   --  1.7*  HEPARINUNFRC  --   --   --   --   --  0.52 0.77* 0.93*  CREATININE 1.48*  --  1.38*  --  1.41*  --   --   --    < > = values in this interval not displayed.    Estimated Creatinine Clearance: 77.4 mL/min (A) (by C-G formula based on SCr of 1.41 mg/dL (H)).   Medical History: Past Medical History:  Diagnosis Date   Aortic aneurysm without rupture (HCC)    Chest pain of uncertain etiology 08/11/2021   CKD (chronic kidney disease)    GERD (gastroesophageal reflux disease)    Heart failure (HCC)    Hypertension    Obesity (BMI 30-39.9) 08/11/2021   Sleep apnea     Medications: Warfarin PTA for afib, mechanical aortic valve -Last dose: 9/14  Information obtained from [facility] clinic note on 06/29/23: -INR goal: 2.5 - 3.5 -Home dose: warfarin 5 mg PO daily -Note initiation of amiodarone in August  Assessment: Pt is a 62 yoM admitted with rectal bleeding with associated dizziness/lightheadedness. CTA revealed acute bleeding in the  ascending colon, diverticular. Pt underwent transfusion and embolization by IR on 9/15. No reversal agents given.   Pt chronically anticoagulated with warfarin for afib, mechanical aortic valve placed in July 2024. Pharmacy consulted to initiate heparin for bridge therapy once INR is subtherapeutic <2.5.   Today, 07/08/23 HL 0.93 supra-therapeutic on 1450 units/hr INR 1.7 Hgb low but stable, plts WNL Per RN no bleeding noted  Goal of Therapy:  INR 2.5 - 3.5 Heparin level 0.3 - 0.7 Monitor platelets by anticoagulation protocol: Yes   Plan:  Reduce heparin infusion to 1200 units/hr CBC, heparin level, INR daily Monitor for signs of bleeding Hold heparin at 0800 this for colonoscopy   Arley Phenix RPh 07/08/2023, 4:37 AM

## 2023-07-08 NOTE — Op Note (Addendum)
[facility] Patient Name: Timothy Browning Procedure Date: 07/08/2023 MRN: [id number] Attending MD: Meryl Dare , MD, [phone] Date of Birth: 25-Feb-1971 CSN: [id number] Age: 52 Admit Type: Inpatient Procedure:                Colonoscopy Indications:              Hematochezia Providers:                Venita Lick. Russella Dar, MD, Lorenza Evangelist, RN, Marja Kays, Technician Referring MD:             [facility] Medicines:                Monitored Anesthesia Care Complications:            No immediate complications. Estimated blood loss:                            None. Estimated Blood Loss:     Estimated blood loss: none. Procedure:                Pre-Anesthesia Assessment:                           - Prior to the procedure, a History and Physical                            was performed, and patient medications and                            allergies were reviewed. The patient's tolerance of                            previous anesthesia was also reviewed. The risks                            and benefits of the procedure and the sedation                            options and risks were discussed with the patient.                            All questions were answered, and informed consent                            was obtained. Prior Anticoagulants: The patient has                            taken Coumadin (warfarin), last dose was 5 days                            prior to procedure and IV heparin stopped at 8 am                            today.  ASA Grade Assessment: III - A patient with                            severe systemic disease. After reviewing the risks                            and benefits, the patient was deemed in                            satisfactory condition to undergo the procedure.                           After obtaining informed consent, the colonoscope                            was passed under direct vision.  Throughout the                            procedure, the patient's blood pressure, pulse, and                            oxygen saturations were monitored continuously. The                            CF-HQ190L ([id number]) Olympus colonoscope was                            introduced through the anus and advanced to the the                            cecum, identified by appendiceal orifice and                            ileocecal valve. The ileocecal valve, appendiceal                            orifice, and rectum were photographed. The quality                            of the bowel preparation was good. The colonoscopy                            was performed without difficulty. The patient                            tolerated the procedure well. Scope In: 12:32:55 PM Scope Out: 12:54:42 PM Scope Withdrawal Time: 0 hours 17 minutes 23 seconds  Total Procedure Duration: 0 hours 21 minutes 47 seconds  Findings:      The perianal and digital rectal examinations were normal.      A single (solitary) fifteen mm ulcer was found in the proximal ascending       colon. No bleeding was present. No stigmata of recent bleeding were       seen. Biopsies were taken with a cold forceps  for histology.      Four sessile polyps were found in the sigmoid colon, descending colon       (2) and cecum. The polyps were 6 to 7 mm in size. These polyps were       removed with a cold snare. Resection and retrieval were complete.      Multiple medium-mouthed and small-mouthed diverticula were found in the       entire colon. There was no evidence of diverticular bleeding.      Internal hemorrhoids were found during retroflexion. The hemorrhoids       were small and Grade I (internal hemorrhoids that do not prolapse).      The exam was otherwise without abnormality on direct and retroflexion       views. Impression:               - A single (solitary) ulcer in the proximal                            ascending  colon. Biopsied.                           - Four 6 to 7 mm polyps in the sigmoid colon, in                            the descending colon and in the cecum, removed with                            a cold snare. Resected and retrieved.                           - Moderate diverticulosis in the entire examined                            colon.                           - Internal hemorrhoids.                           - The examination was otherwise normal on direct                            and retroflexion views. Moderate Sedation:      Not Applicable - Patient had care per Anesthesia. Recommendation:           - Repeat colonoscopy after studies are complete for                            surveillance based on pathology results with Dr.                            Marina Goodell.                           - OK to resume Coumadin (warfarin) tomorrow and  heparin after 5 pm today without a bolus. Refer to                            managing physician for further adjustment of                            therapy.                           - Patient has a contact number available for                            emergencies. The signs and symptoms of potential                            delayed complications were discussed with the                            patient. Return to normal activities tomorrow.                            Written discharge instructions were provided to the                            patient.                           - Resume previous diet.                           - Continue present medications.                           - Await pathology results.                           - If stable overnight OK for discharge tomorrow                            from GI standpoint.                           - Outpatient GI follow up with Dr. Marina Goodell. Procedure Code(s):        --- Professional ---                           [phone], Colonoscopy, flexible; with removal  of                            tumor(s), polyp(s), or other lesion(s) by snare                            technique                           [id number], 59, Colonoscopy, flexible; with biopsy,  single or multiple Diagnosis Code(s):        --- Professional ---                           K63.3, Ulcer of intestine                           D12.5, Benign neoplasm of sigmoid colon                           D12.4, Benign neoplasm of descending colon                           D12.0, Benign neoplasm of cecum                           K64.0, First degree hemorrhoids                           K92.1, Melena (includes Hematochezia)                           K57.30, Diverticulosis of large intestine without                            perforation or abscess without bleeding CPT copyright 2022 American [employer]. All rights reserved. The codes documented in this report are preliminary and upon coder review may  be revised to meet current compliance requirements. Meryl Dare, MD 07/08/2023 1:03:57 PM This report has been signed electronically. Number of Addenda: 0

## 2023-07-08 NOTE — Anesthesia Procedure Notes (Signed)
Procedure Name: MAC Date/Time: 07/08/2023 12:21 PM  Performed by: Elisabeth Cara, CRNAPre-anesthesia Checklist: Patient identified, Emergency Drugs available, Suction available, Patient being monitored and Timeout performed Patient Re-evaluated:Patient Re-evaluated prior to induction Oxygen Delivery Method: Simple face mask Placement Confirmation: positive ETCO2 Dental Injury: Teeth and Oropharynx as per pre-operative assessment

## 2023-07-08 NOTE — Anesthesia Preprocedure Evaluation (Addendum)
Anesthesia Evaluation  Patient identified by MRN, date of birth, ID band Patient awake    Reviewed: Allergy & Precautions, NPO status , Patient's Chart, lab work & pertinent test results, reviewed documented beta blocker date and time   History of Anesthesia Complications Negative for: history of anesthetic complications  Airway Mallampati: II  TM Distance: >3 FB Neck ROM: Full    Dental  (+) Dental Advisory Given, Teeth Intact   Pulmonary sleep apnea    Pulmonary exam normal        Cardiovascular hypertension, Pt. on home beta blockers and Pt. on medications  Rhythm:Regular Rate:Normal + Systolic Click  '24 TTE - EF 55 to 60%. There is mild left ventricular hypertrophy of the basal-septal segment. There is severe dilatation of the aortic root, measuring 52 mm. There is moderate dilatation of the ascending aorta, measuring 45 mm.   Now s/p mechanical AVR    Neuro/Psych  PSYCHIATRIC DISORDERS Anxiety     negative neurological ROS     GI/Hepatic Neg liver ROS,GERD  Controlled,,  Endo/Other   Obesity Na 129   Renal/GU CRFRenal disease     Musculoskeletal negative musculoskeletal ROS (+)    Abdominal   Peds  Hematology  (+) Blood dyscrasia, anemia  On coumadin INR 1.7    Anesthesia Other Findings   Reproductive/Obstetrics                              Anesthesia Physical Anesthesia Plan  ASA: 3  Anesthesia Plan: MAC   Post-op Pain Management: Minimal or no pain anticipated   Induction:   PONV Risk Score and Plan: 1 and Propofol infusion and Treatment may vary due to age or medical condition  Airway Management Planned: Natural Airway and Simple Face Mask  Additional Equipment: None  Intra-op Plan:   Post-operative Plan:   Informed Consent: I have reviewed the patients History and Physical, chart, labs and discussed the procedure including the risks, benefits and  alternatives for the proposed anesthesia with the patient or authorized representative who has indicated his/her understanding and acceptance.       Plan Discussed with: CRNA and Anesthesiologist  Anesthesia Plan Comments:         Anesthesia Quick Evaluation

## 2023-07-08 NOTE — Progress Notes (Signed)
   07/08/23 2004  BiPAP/CPAP/SIPAP  BiPAP/CPAP/SIPAP Pt Type Adult  BiPAP/CPAP/SIPAP Resmed (from home)  Mask Type Full face mask  FiO2 (%) 21 %  Heater Temperature  (sterile water supplied)  Patient Home Equipment Yes  Auto Titrate Yes (14-17)  BiPAP/CPAP /SiPAP Vitals  Pulse Rate 72  Resp 19  SpO2 99 %  Bilateral Breath Sounds Clear  MEWS Score/Color  MEWS Score 0  MEWS Score Color Timothy Browning

## 2023-07-08 NOTE — Progress Notes (Signed)
Brief Progress note: Went in to assess patient before procedure today.  Significant other at bedside.  Patient states he tolerated bowel prep well.  His first 2 bowel movements had streaks of blood then they turned to brown and now clear yellow liquid.  Patient denies shortness of breath, chest pain, or dizziness.  Answered all the patient's questions and concerns.  Per RN Megan Bullins Heparin drip stopped at 8:12am.  Cote Mayabb, FNP-C

## 2023-07-08 NOTE — Progress Notes (Signed)
PROGRESS NOTE Timothy Browning  AOZ:[id number] DOB: December 07, 1970 DOA: 07/04/2023 PCP: Marrianne Mood, MD  Brief Narrative/Hospital Course: Timothy Browning is a 52 y.o. male with a history of hypertension, CKD stage IIIa, paroxysmal atrial fibrillation, aortic aneurysm status post Bentall procedure and status post aortic valve replacement with mechanical valve on Coumadin.  Patient presented secondary to rectal bleeding and lightheadedness and was found to have evidence of hematochezia with hypotension  CT angio GI bleed protocol>>showed acute bleed located in the ascending colon secondary to diverticular bleeding. IR performed emergent angiogram with successful localization and embolization of ascending colonic diverticula GI is following.  Since embolization had 1 episode of dark red to stool 9/16 and dark black formed stool 9/17 with a streak of blood. Received 1 unit PRBC 9/15 and Coumadin has been held since 9/15.  Heparin bridging started 9/18 as INR subtherapeutic seen by cardiology, who advised okay to hold off heparin up to 1 week in post aortic valve patient, and during inpatient hospitalization can hold off for procedure,    Subjective: Patient seen and examined. Overnight patient is afebrile, vital.   Labs showed sodium slightly low 129, INR 1.7 hemoglobin stable at 9.4 Holding heparin drip at 8 AM per procedure note He had been drinking prep and no bleeding noticed.  No nausea vomiting  Assessment and Plan: Principal Problem:   Acute lower GI bleeding Active Problems:   Stage 3 chronic kidney disease (HCC)   OSA (obstructive sleep apnea)   Paroxysmal atrial fibrillation (HCC)   Anticoagulation management encounter   Acute blood loss anemia   Anticoagulated by anticoagulation treatment   Acute lower GI bleed likely diverticular bleed Acute blood loss anemia due to GI bleeding: S/P successful IR coil embolization emergently.  GI following and holding heparin drip this morning  for colonoscopy today afternoon. Continue to monitor hemoglobin as below.  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 Continue home amiodarone.appreciate cardiology input.   Recent Labs  Lab 07/05/23 0739 07/05/23 1844 07/06/23 0310 07/07/23 0313 07/08/23 0410  INR 2.6* 2.6* 2.7* 2.0* 1.7*    Hypotension: Likely in the setting of ABLA, BP is stable currently.Monitor  Fever up to 100.4 on 9/16: Afebrile since, had mild leukocytosis BUT resolved.procalcitonin -9/17.Monitoring without antibiotics.  CKD stage IIIa: Renal function stable and improved  Mild hyponatremia: Monitor  OSA: cont CPAP nightly.  Class I Obesity: Patient's Body mass index is 31.22 kg/m. : Will benefit with PCP follow-up, weight loss  healthy lifestyle   DVT prophylaxis: SCDs Start: 07/04/23 1540 Code Status:   Code Status: Full Code Family Communication: plan of care discussed with patient  at bedside. Patient status is: Inpatient because of GI bleeding Level of care: Progressive   Dispo: The patient  is from: Home lives with family            Anticipated disposition: tbd  Objective: Vitals last 24 hrs: Vitals:   07/07/23 2011 07/07/23 2049 07/07/23 2055 07/08/23  0518  BP:  (!) 128/92 (!) 128/92 115/84  Pulse: 94 89 89 87  Resp: 17 18  18   Temp:  99.7 F (37.6 C) 99.7 F (37.6 C) 97.7 F (36.5 C)  TempSrc:   Oral Oral  SpO2: 98% 100% 100% 100%  Weight:      Height:       Weight change:   Physical Examination: General exam: alert awake, oriented x3 HEENT:Oral mucosa moist, Ear/Nose WNL grossly Respiratory system: Bilaterally clear BS,no use of accessory muscle Cardiovascular system: S1 & S2 +, No JVD.  Mechanical aortic valve present, surgical scar present Gastrointestinal system: Abdomen soft,NT,ND, BS+ Nervous System: Alert, awake, moving all extremities,and following commands. Extremities: LE edema neg,distal peripheral pulses palpable and warm.  Skin: No rashes,no icterus. MSK: Normal muscle bulk,tone, power   Medications reviewed:  Scheduled Meds:  amiodarone  200 mg Oral BID   Chlorhexidine Gluconate Cloth  6 each Topical Daily   rosuvastatin  5 mg Oral QHS   sodium chloride flush  3 mL Intravenous Q12H  Continuous Infusions:  sodium chloride      Diet Order             Diet NPO time specified Except for: Sips with Meds  Diet effective midnight                  Intake/Output Summary (Last 24 hours) at 07/08/2023 0954 Last data filed at 07/08/2023 4010 Gross per 24 hour  Intake 717.78 ml  Output 1250 ml  Net -532.22 ml   Net IO Since Admission: -4,066.99 mL [07/08/23 0954]  Wt Readings from Last 3 Encounters:  07/07/23 104.4 kg  06/25/23 104.8 kg  06/02/23 103.1 kg     Unresulted Labs (From admission, onward)     Start     Ordered   07/08/23 0500  Basic metabolic panel  Daily,   R     Question:  Specimen collection method  Answer:  Lab=Lab collect   07/07/23 1022   07/06/23 0500  Protime-INR  Daily,   R     Question:  Specimen collection method  Answer:  Lab=Lab collect   07/05/23 1138   07/05/23 0652  CBC  Daily,   R     Question:  Specimen collection method  Answer:  Lab=Lab collect   07/05/23 0651           Data Reviewed: I have personally reviewed following labs and imaging studies CBC: Recent Labs  Lab 07/04/23 1130 07/04/23 2152 07/05/23 0739 07/05/23 1031 07/06/23 0310 07/06/23 1747 07/07/23 0313 07/08/23 0410  WBC 4.3  --  12.3*  --  13.9*  --  11.9* 9.1  HGB 13.3   < > 10.4* 10.3* 9.7* 9.2* 9.2* 9.4*  HCT 41.2   < > 31.6* 30.8* 29.5* 28.5* 28.0* 29.2*  MCV 94.3  --  92.1  --  94.2  --  92.4 93.3  PLT 319  --  224  --  234  --  239 264   < > = values in this interval not displayed.   Basic Metabolic Panel: Recent Labs  Lab 07/05/23 0317 07/05/23 0739 07/06/23 0310 07/07/23 0313 07/08/23 0410  NA 134* 133* 133* 131* 129*  K 4.2 4.0 3.9 3.8 3.6  CL 105 105 101 101 100  CO2 22 22 25 24 22   GLUCOSE 127* 113* 103* 93 100*  BUN 18 20 15 14 10   CREATININE 1.31* 1.48* 1.38* 1.41* 1.13  CALCIUM 7.9* 7.8* 7.9* 8.0* 8.0*   GFR: Estimated Creatinine Clearance: 96.6 mL/min (by C-G formula based on SCr of 1.13 mg/dL). Liver Function Tests: Recent Labs  Lab 07/04/23 1130  AST 17  ALT 24  ALKPHOS 56  BILITOT 0.7  PROT 6.6  ALBUMIN 3.6   Recent Labs  Lab 07/05/23 0739 07/05/23 1844 07/06/23 0310 07/07/23 0313 07/08/23 0410  INR 2.6* 2.6* 2.7* 2.0* 1.7*  CBG: Recent Labs  Lab 07/04/23 1140  GLUCAP 126*   Recent Labs  Lab 07/06/23 0309  PROCALCITON <0.10   Recent Results (from the past 240 hour(s))  MRSA Next Gen by PCR, Nasal     Status: None   Collection Time: 07/04/23  4:46 PM   Specimen: Nasal Mucosa; Nasal Swab  Result Value Ref Range Status   MRSA by PCR Next Gen NOT DETECTED NOT DETECTED Final    Comment: (NOTE) The GeneXpert MRSA Assay (FDA approved for NASAL specimens only), is one component of a comprehensive MRSA colonization surveillance program. It is not intended to diagnose MRSA infection nor to guide or monitor treatment for MRSA infections. Test performance is not FDA approved in patients less than 74 years old. Performed  at [facility], [address] [place], [place] [address]     Antimicrobials: Anti-infectives (From admission, onward)    None      Culture/Microbiology    Component Value Date/Time   SDES ABSCESS RIGHT BUTTOCKS 11/21/2007 1301   SPECREQUEST NONE 11/21/2007 1301   CULT  11/21/2007 1301    MODERATE METHICILLIN RESISTANT STAPHYLOCOCCUS AUREUS Note: RIFAMPIN AND GENTAMICIN SHOULD NOT BE USED AS SINGLE DRUGS FOR TREATMENT OF STAPH INFECTIONS. This organism DOES NOT demonstrate inducible Clindamycin resistance in vitro. CRITICAL RESULT CALLED TO, READ BACK BY AND VERIFIED WITH: S FOX 11/24/07 0900  BY J CRADDOCK   REPTSTATUS 11/24/2007 FINAL 11/21/2007 1301    Radiology Studies: No results found.   LOS: 4 days   Timothy Boast, MD [facility]  07/08/2023, 9:54 AM

## 2023-07-08 NOTE — Interval H&P Note (Signed)
History and Physical Interval Note:  07/08/2023 11:53 AM  Timothy Browning  has presented today for surgery, with the diagnosis of Diverticular bleed, anemia.  The various methods of treatment have been discussed with the patient and family. After consideration of risks, benefits and other options for treatment, the patient has consented to  Procedure(s): COLONOSCOPY WITH PROPOFOL (N/A) as a surgical intervention.  The patient's history has been reviewed, patient examined, no change in status, stable for surgery.  I have reviewed the patient's chart and labs.  Questions were answered to the patient's satisfaction.     Venita Lick. Russella Dar

## 2023-07-09 DIAGNOSIS — K922 Gastrointestinal hemorrhage, unspecified: Secondary | ICD-10-CM | POA: Diagnosis not present

## 2023-07-09 LAB — BASIC METABOLIC PANEL
Anion gap: 8 (ref 5–15)
BUN: 13 mg/dL (ref 6–20)
CO2: 24 mmol/L (ref 22–32)
Calcium: 8.3 mg/dL — ABNORMAL LOW (ref 8.9–10.3)
Chloride: 101 mmol/L (ref 98–111)
Creatinine, Ser: 1.41 mg/dL — ABNORMAL HIGH (ref 0.61–1.24)
GFR, Estimated: >60 mL/min (ref >60)
Glucose, Bld: 90 mg/dL (ref 70–99)
Potassium: 3.4 mmol/L — ABNORMAL LOW (ref 3.5–5.1)
Sodium: 133 mmol/L — ABNORMAL LOW (ref 135–145)

## 2023-07-09 LAB — HEPARIN LEVEL (UNFRACTIONATED)
Heparin Unfractionated: 0.21 IU/mL — ABNORMAL LOW (ref 0.30–0.70)
Heparin Unfractionated: 0.42 IU/mL (ref 0.30–0.70)

## 2023-07-09 LAB — CBC
HCT: 25.9 % — ABNORMAL LOW (ref 39.0–52.0)
Hemoglobin: 8.5 g/dL — ABNORMAL LOW (ref 13.0–17.0)
MCH: 30.7 pg (ref 26.0–34.0)
MCHC: 32.8 g/dL (ref 30.0–36.0)
MCV: 93.5 fL (ref 80.0–100.0)
Platelets: 316 10*3/uL (ref 150–400)
RBC: 2.77 MIL/uL — ABNORMAL LOW (ref 4.22–5.81)
RDW: 14.2 % (ref 11.5–15.5)
WBC: 7.5 10*3/uL (ref 4.0–10.5)
nRBC: 0 % (ref 0.0–0.2)

## 2023-07-09 LAB — SURGICAL PATHOLOGY

## 2023-07-09 LAB — PROTIME-INR
INR: 1.4 — ABNORMAL HIGH (ref 0.8–1.2)
Prothrombin Time: 17.7 seconds — ABNORMAL HIGH (ref 11.4–15.2)

## 2023-07-09 MED ORDER — WARFARIN SODIUM 5 MG PO TABS
7.5000 mg | ORAL_TABLET | Freq: Once | ORAL | Status: AC
  Administered 2023-07-09: 7.5 mg via ORAL
  Filled 2023-07-09: qty 1

## 2023-07-09 MED ORDER — WARFARIN - PHARMACIST DOSING INPATIENT: Freq: Every day | Status: DC

## 2023-07-09 NOTE — Progress Notes (Signed)
ANTICOAGULATION CONSULT NOTE - Follow Up  Pharmacy Consult for warfarin Indication:  afib, mechanical aortic valve)  Allergies  Allergen Reactions   Ibuprofen-Acetaminophen Other (See Comments)    Advise by doctor, Due to kidney issues    Patient Measurements: Height: 6' (182.9 cm) Weight: 104.6 kg (230 lb 9.6 oz) IBW/kg (Calculated) : 77.6  Vital Signs: Temp: 98.5 F (36.9 C) (09/20 0500) Temp Source: Oral (09/20 0500) BP: 131/85 (09/20 0500) Pulse Rate: 80 (09/20 0500)  Labs: Recent Labs    07/07/23 0313 07/07/23 1213 07/07/23 1951 07/08/23 0410 07/09/23 0009 07/09/23 0529  HGB 9.2*  --   --  9.4*  --  8.5*  HCT 28.0*  --   --  29.2*  --  25.9*  PLT 239  --   --  264  --  316  LABPROT 23.2*  --   --  20.2*  --  17.7*  INR 2.0*  --   --  1.7*  --  1.4*  HEPARINUNFRC  --    < > 0.77* 0.93* 0.21*  --   CREATININE 1.41*  --   --  1.13  --  1.41*   < > = values in this interval not displayed.    Estimated Creatinine Clearance: 77.5 mL/min (A) (by C-G formula based on SCr of 1.41 mg/dL (H)).   Medical History: Past Medical History:  Diagnosis Date   Aortic aneurysm without rupture (HCC)    Chest pain of uncertain etiology 08/11/2021   CKD (chronic kidney disease)    GERD (gastroesophageal reflux disease)    Heart failure (HCC)    Hypertension    Obesity (BMI 30-39.9) 08/11/2021   Sleep apnea     Medications: Warfarin PTA for afib, mechanical aortic valve -Last dose: 9/14  Information obtained from [facility] clinic note on 06/29/23: -INR goal: 2.5 - 3.5 -Home dose: warfarin 5 mg PO daily -Note initiation of amiodarone in August  Assessment: Pt is a 44 yoM admitted with rectal bleeding with associated dizziness/lightheadedness. CTA revealed acute bleeding in the ascending colon, diverticular. Pt underwent transfusion and embolization by IR on 9/15. No reversal agents given.   Pt chronically anticoagulated with warfarin for afib, mechanical aortic valve placed in  July 2024. Pharmacy initially consulted to initiate heparin for bridge therapy once INR subtherapeutic <2.5.  Now okay to resume warfarin. INR 1.4. Cardiology note unnecessary to bridge with heparin.  Today, 07/09/23 -INR subtherapeutic -Planning for discharge  Goal of Therapy:  INR 2.5 - 3.5 (Cardiology prefer target 2.5-3.0 if possible to minimize adverse bleeding risk) Monitor platelets by anticoagulation protocol: Yes   Plan:  Per discussion with MD, okay to discontinue heparin Warfarin 7.5 mg today (1.5 x home dose) Recommend continuing home dose of warfarin 5 mg daily at discharge (INR 2.9 on this dose at last anticoag visit) with close INR follow up in next week   Pricilla Riffle, PharmD, BCPS Clinical Pharmacist 07/09/2023 9:40 AM

## 2023-07-09 NOTE — Progress Notes (Signed)
PIV x 2 removed as documented. AVS reviewed w/ Marquita Palms, teach back method used. Pt verbalized an understanding. No other questions at this time. Pt dressing for d/c to home. Pt's wife enroute  to hospital - will arrive in 15 minutes and will pick pt up at [facility] entrance

## 2023-07-09 NOTE — Progress Notes (Signed)
ANTICOAGULATION CONSULT NOTE - Follow Up  Pharmacy Consult for heparin Indication:  Warfarin (for afib, mechanical aortic valve) on hold. Heparin bridge for subtherapeutic INR <2.5.   Allergies  Allergen Reactions   Ibuprofen-Acetaminophen Other (See Comments)    Advise by doctor, Due to kidney issues    Patient Measurements: Height: 6' (182.9 cm) Weight: 104.4 kg (230 lb 2.6 oz) IBW/kg (Calculated) : 77.6 Heparin Dosing Weight: 100 kg  Vital Signs: Temp: 98.8 F (37.1 C) (09/19 2004) Temp Source: Oral (09/19 2004) BP: 129/81 (09/19 2004) Pulse Rate: 93 (09/19 2004)  Labs: Recent Labs    07/06/23 0310 07/06/23 1747 07/07/23 0313 07/07/23 1213 07/07/23 1951 07/08/23 0410 07/09/23 0009  HGB 9.7* 9.2* 9.2*  --   --  9.4*  --   HCT 29.5* 28.5* 28.0*  --   --  29.2*  --   PLT 234  --  239  --   --  264  --   LABPROT 28.8*  --  23.2*  --   --  20.2*  --   INR 2.7*  --  2.0*  --   --  1.7*  --   HEPARINUNFRC  --   --   --    < > 0.77* 0.93* 0.21*  CREATININE 1.38*  --  1.41*  --   --  1.13  --    < > = values in this interval not displayed.    Estimated Creatinine Clearance: 96.6 mL/min (by C-G formula based on SCr of 1.13 mg/dL).   Medical History: Past Medical History:  Diagnosis Date   Aortic aneurysm without rupture (HCC)    Chest pain of uncertain etiology 08/11/2021   CKD (chronic kidney disease)    GERD (gastroesophageal reflux disease)    Heart failure (HCC)    Hypertension    Obesity (BMI 30-39.9) 08/11/2021   Sleep apnea     Medications: Warfarin PTA for afib, mechanical aortic valve -Last dose: 9/14  Information obtained from [facility] clinic note on 06/29/23: -INR goal: 2.5 - 3.5 -Home dose: warfarin 5 mg PO daily -Note initiation of amiodarone in August  Assessment: Pt is a 16 yoM admitted with rectal bleeding with associated dizziness/lightheadedness. CTA revealed acute bleeding in the ascending colon, diverticular. Pt underwent transfusion and  embolization by IR on 9/15. No reversal agents given.   Pt chronically anticoagulated with warfarin for afib, mechanical aortic valve placed in July 2024. Pharmacy consulted to initiate heparin for bridge therapy once INR is subtherapeutic <2.5.   Today, 07/09/23 HL 0.21 subtherapeutic on 1200 units/hr No bleeding noted  Goal of Therapy:  INR 2.5 - 3.5 Heparin level 0.3 - 0.7 Monitor platelets by anticoagulation protocol: Yes   Plan:  Increase heparin drip to 1300 units/hr Heparin level in 6 hours CBC, heparin level, INR daily Monitor for signs of bleeding   Arley Phenix RPh 07/09/2023, 12:51 AM

## 2023-07-09 NOTE — Discharge Summary (Signed)
Physician Discharge Summary  Timothy Browning NGE:[id number] DOB: Feb 09, 1971 DOA: 07/04/2023  PCP: Marrianne Mood, MD  Admit date: 07/04/2023 Discharge date: 07/09/2023 Recommendations for Outpatient Follow-up:  Follow up with PCP in 1 weeks-call for appointment Please obtain BMP/CBC in one week Follow-up with Coumadin clinic in 2 -3 days  Discharge Dispo: home Discharge Condition: Stable Code Status:   Code Status: Full Code Diet recommendation:  Diet Order             Diet Heart Room service appropriate? Yes; Fluid consistency: Thin  Diet effective now                    Brief/Interim Summary: Timothy Browning is a 52 y.o. male with a history of hypertension, CKD stage IIIa, paroxysmal atrial fibrillation, aortic aneurysm status post Bentall procedure and status post aortic valve replacement with mechanical valve on Coumadin.  Patient presented secondary to rectal bleeding and lightheadedness and was found to have evidence of hematochezia with hypotension  CT angio GI bleed protocol>>showed acute bleed located in the ascending colon secondary to diverticular bleeding. IR performed emergent angiogram with successful localization and embolization of ascending colonic diverticula GI is following.  Since embolization had 1 episode of dark red to stool 9/16 and dark black formed stool 9/17 with a streak of blood. Received 1 unit PRBC 9/15 and Coumadin has been held since 9/15.  Heparin bridging started 9/18 as INR subtherapeutic seen by cardiology, who advised okay to hold off heparin up to 1 week in post aortic valve patient, and during inpatient hospitalization can hold off for procedure. 9/19 colonoscopy showed single ulcer in the proximal ascending colon for 6 to 7 mm polyps in sigmoid colon, descending colon and rectum removed with cold snare moderate diverticulosis in the entire colon, internal hemorrhoids> recommended for repeat colonoscopy for surveillance based on pathology and  okay to resume Coumadin 9/20, okay to resume previous diet follow-up pathology result and okay for discharge from surgical standpoint on 9/20.  Patient is tolerating diet denies any rectal bleeding in the stool. He feels comfortable going home today   Discharge Diagnoses:  Principal Problem:   Acute lower GI bleeding Active Problems:   Stage 3 chronic kidney disease (HCC)   OSA (obstructive sleep apnea)   Paroxysmal atrial fibrillation (HCC)   Anticoagulation management encounter   Acute blood loss anemia   Anticoagulated by anticoagulation treatment   Hematochezia   Benign neoplasm of cecum   Benign neoplasm of descending colon   Benign neoplasm of sigmoid colon   Colon ulcer  Acute lower GI bleed likely diverticular bleed Acute blood loss anemia due to GI bleeding: S/P successful IR coil embolization emergently.9/19 colonoscopy showed single ulcer in the proximal ascending colon for 6 to 7 mm polyps in sigmoid colon, descending colon and rectum removed with cold snare moderate diverticulosis in the entire colon, internal hemorrhoids> recommended for repeat colonoscopy for surveillance based on pathology and okay to resume Coumadin 9/20. 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 Continue home amiodarone.appreciate cardiology input.  I again discussed with Dr.  Rennis Golden from cardiology he advised not to bridge and okay for home "I would be hesistant to bridge with recent bleeding and ulcer - would aggressively titrate warfarin dosing to target INR 2.5-3.0. I discussed this with some of our cardiac pharmD's at [facility] and they agree."  Patient has been informed to follow-up with Coumadin clinic  Hypotension: Likely in the setting of ABLA, BP is stable currently.Monitor   Fever up to 100.4 on 9/16: Afebrile since, had mild leukocytosis BUT resolved.procalcitonin -9/17.Monitoring without antibiotics.   CKD stage IIIa: Renal function stable and improved   Mild hyponatremia: Monitor   OSA: cont CPAP nightly.   Class I Obesity: Patient's Body mass index is 31.22 kg/m. : Will benefit with PCP follow-up, weight loss  healthy lifestyle  Consults: Gastro enterology cardiology Subjective: Alert awake resting comfortably No rectal bleeding or blood in the stool. Tolerating diet  Discharge Exam: Vitals:   07/08/23 2004 07/09/23 0500  BP: 129/81 131/85  Pulse: 93 80  Resp: 17 18  Temp: 98.8 F (37.1 C) 98.5 F (36.9 C)  SpO2: 100% 99%   General: Pt is alert, awake, not in acute distress Cardiovascular: RRR, S1/S2 +, no rubs, no gallops Respiratory: CTA bilaterally, no wheezing, no rhonchi Abdominal: Soft, NT, ND, bowel sounds + Extremities: no edema, no cyanosis  Discharge Instructions  Discharge Instructions     Discharge instructions   Complete by: As directed    Please follow-up with Coumadin clinic to aggressively uptitrate your Coumadin and monitor INR until therapeutic  Please call call MD or return to ER for similar or worsening recurring problem that brought you to hospital or if any fever,nausea/vomiting,abdominal pain, uncontrolled pain, chest pain,  shortness of breath or any other alarming symptoms.  Please follow-up your doctor as instructed in a week time and call the office for appointment.  Please avoid alcohol,  smoking, or any other illicit substance and maintain healthy habits including taking your regular medications as prescribed.  You were cared for by a hospitalist during your hospital stay. If you have any questions about your discharge medications or the care you received while you were in the hospital after you are discharged, you can call the unit and ask to speak with the hospitalist on call if the hospitalist that took care of you is not available.  Once you are discharged, your primary care physician will handle any further medical issues. Please note that NO REFILLS for any discharge medications will be authorized once you are discharged, as it is imperative that you return to your primary care physician (or establish a relationship with a primary care physician if you do not have one) for your aftercare needs so that they can reassess your need for medications and monitor your lab values   Increase activity slowly   Complete by: As directed    No wound care   Complete by: As directed       Allergies as of 07/09/2023       Reactions   Ibuprofen-acetaminophen Other (See Comments)   Advise by doctor, Due to kidney issues        Medication List     TAKE these medications    amiodarone 200 MG tablet Commonly known as: PACERONE Amiodarone 400 mg (  two tablets) twice daily for 1 week, THEN 200 twice daily until seen in office What changed:  how much to take how to take this when to take this additional instructions   aspirin EC 81 MG tablet Take 1 tablet (81 mg total) by mouth daily. Swallow whole.   cyclobenzaprine 5 MG tablet Commonly known as: FLEXERIL Take 5 mg by mouth 3 (three) times daily as needed for muscle spasms.   metoprolol succinate 25 MG 24 hr tablet Commonly known as: TOPROL-XL Take 0.5 tablets (12.5 mg total) by mouth in the morning and at bedtime.   rosuvastatin 5 MG tablet Commonly known as: CRESTOR Take 1 tablet (5 mg total) by mouth daily. What  changed: when to take this   sildenafil 25 MG tablet Commonly known as: VIAGRA Take 100 mg by mouth daily as needed for erectile dysfunction.   warfarin 2.5 MG tablet Commonly known as: COUMADIN Take as directed. If you are unsure how to take this medication, talk to your nurse or doctor. Original instructions: Take 2 tablets (5 mg total) by mouth daily. Or as directed by the Coumadin Clinic        Follow-up Information     Marrianne Mood, MD Follow up in 1 week(s).   Specialty: Internal Medicine Contact information: [address] [place] [address] [phone]                Allergies  Allergen Reactions   Ibuprofen-Acetaminophen Other (See Comments)    Advise by doctor, Due to kidney issues    The results of significant diagnostics from this hospitalization (including imaging, microbiology, ancillary and laboratory) are listed below for reference.    Microbiology: Recent Results (from the past 240 hour(s))  MRSA Next Gen by PCR, Nasal     Status: None   Collection Time: 07/04/23  4:46 PM   Specimen: Nasal Mucosa; Nasal Swab  Result Value Ref Range Status   MRSA by PCR Next Gen NOT DETECTED NOT DETECTED Final    Comment: (NOTE) The GeneXpert MRSA Assay (FDA approved for NASAL specimens only), is one component of a comprehensive MRSA colonization surveillance program. It is not intended to diagnose MRSA infection nor to guide or monitor treatment for MRSA infections. Test performance is not FDA approved in patients less than 63 years old. Performed at [facility], [address] [place], [place] [address]     Procedures/Studies: IR US Guide Vasc Access Right  Result Date: 07/05/2023 INDICATION: 52 year old male on Coumadin with for a prior aortic valve replacement. 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 EXAM: IR EMBO ART VEN HEMORR LYMPH EXTRAV INC GUIDE ROADMAPPING;  ADDITIONAL ARTERIOGRAPHY MEDICATIONS: None. ANESTHESIA/SEDATION: Moderate (conscious) sedation was employed during this procedure. A total of Versed 2 mg and Fentanyl 50 mcg was administered intravenously by radiology nursing. Moderate Sedation Time: 70 minutes. The patient's level of consciousness and vital signs were monitored continuously by radiology nursing throughout the procedure under my direct supervision. CONTRAST:  100 mL Omnipaque 300 FLUOROSCOPY: Radiation Exposure Index (as provided by the fluoroscopic device): 2,922 mGy Kerma COMPLICATIONS: None immediate. PROCEDURE: Informed consent was obtained from the patient following explanation of the procedure, risks, benefits and alternatives. The patient understands, agrees and consents for the procedure. All questions were addressed. A time out was performed prior to the initiation of the procedure. Maximal barrier sterile technique utilized including caps, mask, sterile gowns, sterile gloves, large sterile  drape, hand hygiene, and Betadine prep. The right common femoral artery was interrogated with ultrasound and found to be widely patent. An image was obtained and stored for the medical record. Local anesthesia was attained by infiltration with 1% lidocaine. A small dermatotomy was made. Under real-time sonographic guidance, the vessel was punctured with a 21 gauge micropuncture needle. Using standard technique, the initial micro needle was exchanged over a 0.018 micro wire for a transitional 4 [place] micro sheath. The micro sheath was then exchanged over a 0.035 wire for a 5 French vascular sheath. A C2 cobra catheter was advanced over a Bentson wire and used to select the celiac artery. A celiac arteriogram was performed. No evidence of active bleeding. No evidence of replaced colic artery. The C2 cobra catheter was next advanced into the superior mesenteric artery. A arteriogram was performed. There is evidence of active bleeding arising from the  terminal branch in the ascending colon as expected from the prior CTA. A Cook can tot a microcatheter was advanced into the ileocolic. An arteriogram was performed. The active bleeding branch does not appear to arise from the distal ileo colic artery. The microcatheter was next advanced into the right colic artery. Arteriography was performed. The hemorrhage is again visualized. Have appears to arise at the anti mesenteric border of the colon interval region of potentially several feeding distal branch arteries. The microcatheter was advanced into the first potential arterial feeder. Contrast injection confirms active hemorrhage. Coil embolization was performed utilizing a series of 1 and 2 mm low-profile penumbra detachable coils. The microcatheter was brought back. Contrast injection was performed. The embolized vessel is successfully embolized. However, additional bleeding is occurring from a more cephalad branch. The microcatheter was successfully navigated into this branch. Contrast injection was performed confirming active bleeding. Coil embolization was then performed. During the coil embolization there was some extravasation of contrast likely from a coil protruding through the very small arterial wall. Additional coil embolization was performed with successful cessation of hemorrhage. Follow-up contrast injection again performed there is a third tiny distal branch artery still contributing 2 bleeding at the diverticular site. The microcatheter was advanced into this third artery and additional coil embolization was performed. As before, a combination of low-profile penumbra detachable coils in 2 and 3 mm diameters were deployed. Final arteriography demonstrates cessation bleeding. The catheter system was removed. Hemostasis was attained with the assistance of a Celt arterial closure device. IMPRESSION: Successful embolization of distal branches of the right colic artery which were providing blood flow to  the hemorrhaging ascending colonic diverticulum. No evidence of continued bleeding at the end of the procedure. Electronically Signed   By: Malachy Moan M.D.   On: 07/05/2023 12:26   IR Angiogram Visceral Selective  Result Date: 07/05/2023 INDICATION: 52 year old male on Coumadin with for a prior aortic valve replacement. 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 EXAM: IR EMBO ART VEN HEMORR LYMPH EXTRAV INC GUIDE ROADMAPPING; ADDITIONAL ARTERIOGRAPHY MEDICATIONS: None. ANESTHESIA/SEDATION: Moderate (conscious) sedation was employed during this procedure. A total of Versed 2 mg and Fentanyl 50 mcg was administered intravenously by radiology nursing. Moderate Sedation Time: 70 minutes. The patient's level of consciousness and vital signs were monitored continuously by radiology nursing throughout the procedure under my direct supervision. CONTRAST:  100 mL Omnipaque 300 FLUOROSCOPY: Radiation Exposure Index (as provided by the fluoroscopic device): 2,922 mGy Kerma COMPLICATIONS: None immediate. PROCEDURE: Informed consent was obtained from the patient following  explanation of the procedure, risks, benefits and alternatives. The patient understands, agrees and consents for the procedure. All questions were addressed. A time out was performed prior to the initiation of the procedure. Maximal barrier sterile technique utilized including caps, mask, sterile gowns, sterile gloves, large sterile drape, hand hygiene, and Betadine prep. The right common femoral artery was interrogated with ultrasound and found to be widely patent. An image was obtained and stored for the medical record. Local anesthesia was attained by infiltration with 1% lidocaine. A small dermatotomy was made. Under real-time sonographic guidance, the vessel was punctured with a 21 gauge micropuncture needle. Using standard technique, the initial micro needle was exchanged over a 0.018  micro wire for a transitional 4 [place] micro sheath. The micro sheath was then exchanged over a 0.035 wire for a 5 French vascular sheath. A C2 cobra catheter was advanced over a Bentson wire and used to select the celiac artery. A celiac arteriogram was performed. No evidence of active bleeding. No evidence of replaced colic artery. The C2 cobra catheter was next advanced into the superior mesenteric artery. A arteriogram was performed. There is evidence of active bleeding arising from the terminal branch in the ascending colon as expected from the prior CTA. A Cook can tot a microcatheter was advanced into the ileocolic. An arteriogram was performed. The active bleeding branch does not appear to arise from the distal ileo colic artery. The microcatheter was next advanced into the right colic artery. Arteriography was performed. The hemorrhage is again visualized. Have appears to arise at the anti mesenteric border of the colon interval region of potentially several feeding distal branch arteries. The microcatheter was advanced into the first potential arterial feeder. Contrast injection confirms active hemorrhage. Coil embolization was performed utilizing a series of 1 and 2 mm low-profile penumbra detachable coils. The microcatheter was brought back. Contrast injection was performed. The embolized vessel is successfully embolized. However, additional bleeding is occurring from a more cephalad branch. The microcatheter was successfully navigated into this branch. Contrast injection was performed confirming active bleeding. Coil embolization was then performed. During the coil embolization there was some extravasation of contrast likely from a coil protruding through the very small arterial wall. Additional coil embolization was performed with successful cessation of hemorrhage. Follow-up contrast injection again performed there is a third tiny distal branch artery still contributing 2 bleeding at the diverticular  site. The microcatheter was advanced into this third artery and additional coil embolization was performed. As before, a combination of low-profile penumbra detachable coils in 2 and 3 mm diameters were deployed. Final arteriography demonstrates cessation bleeding. The catheter system was removed. Hemostasis was attained with the assistance of a Celt arterial closure device. IMPRESSION: Successful embolization of distal branches of the right colic artery which were providing blood flow to the hemorrhaging ascending colonic diverticulum. No evidence of continued bleeding at the end of the procedure. Electronically Signed   By: Malachy Moan M.D.   On: 07/05/2023 12:26   IR Angiogram Visceral Selective  Result Date: 07/05/2023 INDICATION: 52 year old male on Coumadin with for a prior aortic valve replacement. 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 EXAM: IR EMBO ART VEN HEMORR LYMPH EXTRAV INC GUIDE ROADMAPPING; ADDITIONAL ARTERIOGRAPHY MEDICATIONS: None. ANESTHESIA/SEDATION: Moderate (conscious) sedation was employed during this procedure. A total of Versed 2 mg and Fentanyl 50 mcg was administered intravenously by radiology nursing. Moderate Sedation Time: 70 minutes. The patient's level of consciousness  and vital signs were monitored continuously by radiology nursing throughout the procedure under my direct supervision. CONTRAST:  100 mL Omnipaque 300 FLUOROSCOPY: Radiation Exposure Index (as provided by the fluoroscopic device): 2,922 mGy Kerma COMPLICATIONS: None immediate. PROCEDURE: Informed consent was obtained from the patient following explanation of the procedure, risks, benefits and alternatives. The patient understands, agrees and consents for the procedure. All questions were addressed. A time out was performed prior to the initiation of the procedure. Maximal barrier sterile technique utilized including caps, mask, sterile gowns,  sterile gloves, large sterile drape, hand hygiene, and Betadine prep. The right common femoral artery was interrogated with ultrasound and found to be widely patent. An image was obtained and stored for the medical record. Local anesthesia was attained by infiltration with 1% lidocaine. A small dermatotomy was made. Under real-time sonographic guidance, the vessel was punctured with a 21 gauge micropuncture needle. Using standard technique, the initial micro needle was exchanged over a 0.018 micro wire for a transitional 4 [place] micro sheath. The micro sheath was then exchanged over a 0.035 wire for a 5 French vascular sheath. A C2 cobra catheter was advanced over a Bentson wire and used to select the celiac artery. A celiac arteriogram was performed. No evidence of active bleeding. No evidence of replaced colic artery. The C2 cobra catheter was next advanced into the superior mesenteric artery. A arteriogram was performed. There is evidence of active bleeding arising from the terminal branch in the ascending colon as expected from the prior CTA. A Cook can tot a microcatheter was advanced into the ileocolic. An arteriogram was performed. The active bleeding branch does not appear to arise from the distal ileo colic artery. The microcatheter was next advanced into the right colic artery. Arteriography was performed. The hemorrhage is again visualized. Have appears to arise at the anti mesenteric border of the colon interval region of potentially several feeding distal branch arteries. The microcatheter was advanced into the first potential arterial feeder. Contrast injection confirms active hemorrhage. Coil embolization was performed utilizing a series of 1 and 2 mm low-profile penumbra detachable coils. The microcatheter was brought back. Contrast injection was performed. The embolized vessel is successfully embolized. However, additional bleeding is occurring from a more cephalad branch. The microcatheter was  successfully navigated into this branch. Contrast injection was performed confirming active bleeding. Coil embolization was then performed. During the coil embolization there was some extravasation of contrast likely from a coil protruding through the very small arterial wall. Additional coil embolization was performed with successful cessation of hemorrhage. Follow-up contrast injection again performed there is a third tiny distal branch artery still contributing 2 bleeding at the diverticular site. The microcatheter was advanced into this third artery and additional coil embolization was performed. As before, a combination of low-profile penumbra detachable coils in 2 and 3 mm diameters were deployed. Final arteriography demonstrates cessation bleeding. The catheter system was removed. Hemostasis was attained with the assistance of a Celt arterial closure device. IMPRESSION: Successful embolization of distal branches of the right colic artery which were providing blood flow to the hemorrhaging ascending colonic diverticulum. No evidence of continued bleeding at the end of the procedure. Electronically Signed   By: Malachy Moan M.D.   On: 07/05/2023 12:26   IR Angiogram Selective Each Additional Vessel  Result Date: 07/05/2023 INDICATION: 52 year old male on Coumadin with for a prior aortic valve replacement. 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 EXAM:  IR EMBO ART VEN HEMORR LYMPH EXTRAV INC GUIDE ROADMAPPING; ADDITIONAL ARTERIOGRAPHY MEDICATIONS: None. ANESTHESIA/SEDATION: Moderate (conscious) sedation was employed during this procedure. A total of Versed 2 mg and Fentanyl 50 mcg was administered intravenously by radiology nursing. Moderate Sedation Time: 70 minutes. The patient's level of consciousness and vital signs were monitored continuously by radiology nursing throughout the procedure under my direct supervision. CONTRAST:  100 mL  Omnipaque 300 FLUOROSCOPY: Radiation Exposure Index (as provided by the fluoroscopic device): 2,922 mGy Kerma COMPLICATIONS: None immediate. PROCEDURE: Informed consent was obtained from the patient following explanation of the procedure, risks, benefits and alternatives. The patient understands, agrees and consents for the procedure. All questions were addressed. A time out was performed prior to the initiation of the procedure. Maximal barrier sterile technique utilized including caps, mask, sterile gowns, sterile gloves, large sterile drape, hand hygiene, and Betadine prep. The right common femoral artery was interrogated with ultrasound and found to be widely patent. An image was obtained and stored for the medical record. Local anesthesia was attained by infiltration with 1% lidocaine. A small dermatotomy was made. Under real-time sonographic guidance, the vessel was punctured with a 21 gauge micropuncture needle. Using standard technique, the initial micro needle was exchanged over a 0.018 micro wire for a transitional 4 [place] micro sheath. The micro sheath was then exchanged over a 0.035 wire for a 5 French vascular sheath. A C2 cobra catheter was advanced over a Bentson wire and used to select the celiac artery. A celiac arteriogram was performed. No evidence of active bleeding. No evidence of replaced colic artery. The C2 cobra catheter was next advanced into the superior mesenteric artery. A arteriogram was performed. There is evidence of active bleeding arising from the terminal branch in the ascending colon as expected from the prior CTA. A Cook can tot a microcatheter was advanced into the ileocolic. An arteriogram was performed. The active bleeding branch does not appear to arise from the distal ileo colic artery. The microcatheter was next advanced into the right colic artery. Arteriography was performed. The hemorrhage is again visualized. Have appears to arise at the anti mesenteric border of the  colon interval region of potentially several feeding distal branch arteries. The microcatheter was advanced into the first potential arterial feeder. Contrast injection confirms active hemorrhage. Coil embolization was performed utilizing a series of 1 and 2 mm low-profile penumbra detachable coils. The microcatheter was brought back. Contrast injection was performed. The embolized vessel is successfully embolized. However, additional bleeding is occurring from a more cephalad branch. The microcatheter was successfully navigated into this branch. Contrast injection was performed confirming active bleeding. Coil embolization was then performed. During the coil embolization there was some extravasation of contrast likely from a coil protruding through the very small arterial wall. Additional coil embolization was performed with successful cessation of hemorrhage. Follow-up contrast injection again performed there is a third tiny distal branch artery still contributing 2 bleeding at the diverticular site. The microcatheter was advanced into this third artery and additional coil embolization was performed. As before, a combination of low-profile penumbra detachable coils in 2 and 3 mm diameters were deployed. Final arteriography demonstrates cessation bleeding. The catheter system was removed. Hemostasis was attained with the assistance of a Celt arterial closure device. IMPRESSION: Successful embolization of distal branches of the right colic artery which were providing blood flow to the hemorrhaging ascending colonic diverticulum. No evidence of continued bleeding at the end of the procedure. Electronically Signed   By: Vilma Prader  Archer Asa M.D.   On: 07/05/2023 12:26   IR EMBO ART  VEN HEMORR LYMPH EXTRAV  INC GUIDE ROADMAPPING  Result Date: 07/04/2023 INDICATION: 52 year old male on Coumadin with for a prior aortic valve replacement. He has active bleeding arising from a diverticulum in the ascending colon with  evidence of hypotension. EXAM: IR EMBO ART VEN HEMORR LYMPH EXTRAV INC GUIDE ROADMAPPING; ADDITIONAL ARTERIOGRAPHY MEDICATIONS: None. ANESTHESIA/SEDATION: Moderate (conscious) sedation was employed during this procedure. A total of Versed 2 mg and Fentanyl 50 mcg was administered intravenously by radiology nursing. Moderate Sedation Time: 70 minutes. The patient's level of consciousness and vital signs were monitored continuously by radiology nursing throughout the procedure under my direct supervision. CONTRAST:  100 mL Omnipaque 300 FLUOROSCOPY: Radiation Exposure Index (as provided by the fluoroscopic device): 2,922 mGy Kerma COMPLICATIONS: None immediate. PROCEDURE: Informed consent was obtained from the patient following explanation of the procedure, risks, benefits and alternatives. The patient understands, agrees and consents for the procedure. All questions were addressed. A time out was performed prior to the initiation of the procedure. Maximal barrier sterile technique utilized including caps, mask, sterile gowns, sterile gloves, large sterile drape, hand hygiene, and Betadine prep. The right common femoral artery was interrogated with ultrasound and found to be widely patent. An image was obtained and stored for the medical record. Local anesthesia was attained by infiltration with 1% lidocaine. A small dermatotomy was made. Under real-time sonographic guidance, the vessel was punctured with a 21 gauge micropuncture needle. Using standard technique, the initial micro needle was exchanged over a 0.018 micro wire for a transitional 4 [place] micro sheath. The micro sheath was then exchanged over a 0.035 wire for a 5 French vascular sheath. A C2 cobra catheter was advanced over a Bentson wire and used to select the celiac artery. A celiac arteriogram was performed. No evidence of active bleeding. No evidence of replaced colic artery. The C2 cobra catheter was next advanced into the superior mesenteric  artery. A arteriogram was performed. There is evidence of active bleeding arising from the terminal branch in the ascending colon as expected from the prior CTA. A Cook can tot a microcatheter was advanced into the ileocolic. An arteriogram was performed. The active bleeding branch does not appear to arise from the distal ileo colic artery. The microcatheter was next advanced into the right colic artery. Arteriography was performed. The hemorrhage is again visualized. Have appears to arise at the anti mesenteric border of the colon interval region of potentially several feeding distal branch arteries. The microcatheter was advanced into the first potential arterial feeder. Contrast injection confirms active hemorrhage. Coil embolization was performed utilizing a series of 1 and 2 mm low-profile penumbra detachable coils. The microcatheter was brought back. Contrast injection was performed. The embolized vessel is successfully embolized. However, additional bleeding is occurring from a more cephalad branch. The microcatheter was successfully navigated into this branch. Contrast injection was performed confirming active bleeding. Coil embolization was then performed. During the coil embolization there was some extravasation of contrast likely from a coil protruding through the very small arterial wall. Additional coil embolization was performed with successful cessation of hemorrhage. Follow-up contrast injection again performed there is a third tiny distal branch artery still contributing 2 bleeding at the diverticular site. The microcatheter was advanced into this third artery and additional coil embolization was performed. As before, a combination of low-profile penumbra detachable coils in 2 and 3 mm diameters were deployed. Final arteriography demonstrates cessation bleeding. The catheter  system was removed. Hemostasis was attained with the assistance of a Celt arterial closure device. IMPRESSION: Successful  embolization of distal branches of the right colic artery which were providing blood flow to the hemorrhaging ascending colonic diverticulum. No evidence of continued bleeding at the end of the procedure. Electronically Signed   By: Malachy Moan M.D.   On: 07/04/2023 15:34   IR Angiogram Selective Each Additional Vessel  Result Date: 07/04/2023 INDICATION: 52 year old male on Coumadin with for a prior aortic valve replacement. 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 EXAM: IR EMBO ART VEN HEMORR LYMPH EXTRAV INC GUIDE ROADMAPPING; ADDITIONAL ARTERIOGRAPHY MEDICATIONS: None. ANESTHESIA/SEDATION: Moderate (conscious) sedation was employed during this procedure. A total of Versed 2 mg and Fentanyl 50 mcg was administered intravenously by radiology nursing. Moderate Sedation Time: 70 minutes. The patient's level of consciousness and vital signs were monitored continuously by radiology nursing throughout the procedure under my direct supervision. CONTRAST:  100 mL Omnipaque 300 FLUOROSCOPY: Radiation Exposure Index (as provided by the fluoroscopic device): 2,922 mGy Kerma COMPLICATIONS: None immediate. PROCEDURE: Informed consent was obtained from the patient following explanation of the procedure, risks, benefits and alternatives. The patient understands, agrees and consents for the procedure. All questions were addressed. A time out was performed prior to the initiation of the procedure. Maximal barrier sterile technique utilized including caps, mask, sterile gowns, sterile gloves, large sterile drape, hand hygiene, and Betadine prep. The right common femoral artery was interrogated with ultrasound and found to be widely patent. An image was obtained and stored for the medical record. Local anesthesia was attained by infiltration with 1% lidocaine. A small dermatotomy was made. Under real-time sonographic guidance, the vessel was punctured with a  21 gauge micropuncture needle. Using standard technique, the initial micro needle was exchanged over a 0.018 micro wire for a transitional 4 [place] micro sheath. The micro sheath was then exchanged over a 0.035 wire for a 5 French vascular sheath. A C2 cobra catheter was advanced over a Bentson wire and used to select the celiac artery. A celiac arteriogram was performed. No evidence of active bleeding. No evidence of replaced colic artery. The C2 cobra catheter was next advanced into the superior mesenteric artery. A arteriogram was performed. There is evidence of active bleeding arising from the terminal branch in the ascending colon as expected from the prior CTA. A Cook can tot a microcatheter was advanced into the ileocolic. An arteriogram was performed. The active bleeding branch does not appear to arise from the distal ileo colic artery. The microcatheter was next advanced into the right colic artery. Arteriography was performed. The hemorrhage is again visualized. Have appears to arise at the anti mesenteric border of the colon interval region of potentially several feeding distal branch arteries. The microcatheter was advanced into the first potential arterial feeder. Contrast injection confirms active hemorrhage. Coil embolization was performed utilizing a series of 1 and 2 mm low-profile penumbra detachable coils. The microcatheter was brought back. Contrast injection was performed. The embolized vessel is successfully embolized. However, additional bleeding is occurring from a more cephalad branch. The microcatheter was successfully navigated into this branch. Contrast injection was performed confirming active bleeding. Coil embolization was then performed. During the coil embolization there was some extravasation of contrast likely from a coil protruding through the very small arterial wall. Additional coil embolization was performed with successful cessation of hemorrhage. Follow-up contrast injection  again performed there is a third  tiny distal branch artery still contributing 2 bleeding at the diverticular site. The microcatheter was advanced into this third artery and additional coil embolization was performed. As before, a combination of low-profile penumbra detachable coils in 2 and 3 mm diameters were deployed. Final arteriography demonstrates cessation bleeding. The catheter system was removed. Hemostasis was attained with the assistance of a Celt arterial closure device. IMPRESSION: Successful embolization of distal branches of the right colic artery which were providing blood flow to the hemorrhaging ascending colonic diverticulum. No evidence of continued bleeding at the end of the procedure. Electronically Signed   By: Malachy Moan M.D.   On: 07/04/2023 15:34   IR Angiogram Selective Each Additional Vessel  Result Date: 07/04/2023 INDICATION: 52 year old male on Coumadin with for a prior aortic valve replacement. 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 EXAM: IR EMBO ART VEN HEMORR LYMPH EXTRAV INC GUIDE ROADMAPPING; ADDITIONAL ARTERIOGRAPHY MEDICATIONS: None. ANESTHESIA/SEDATION: Moderate (conscious) sedation was employed during this procedure. A total of Versed 2 mg and Fentanyl 50 mcg was administered intravenously by radiology nursing. Moderate Sedation Time: 70 minutes. The patient's level of consciousness and vital signs were monitored continuously by radiology nursing throughout the procedure under my direct supervision. CONTRAST:  100 mL Omnipaque 300 FLUOROSCOPY: Radiation Exposure Index (as provided by the fluoroscopic device): 2,922 mGy Kerma COMPLICATIONS: None immediate. PROCEDURE: Informed consent was obtained from the patient following explanation of the procedure, risks, benefits and alternatives. The patient understands, agrees and consents for the procedure. All questions were addressed. A time out was performed  prior to the initiation of the procedure. Maximal barrier sterile technique utilized including caps, mask, sterile gowns, sterile gloves, large sterile drape, hand hygiene, and Betadine prep. The right common femoral artery was interrogated with ultrasound and found to be widely patent. An image was obtained and stored for the medical record. Local anesthesia was attained by infiltration with 1% lidocaine. A small dermatotomy was made. Under real-time sonographic guidance, the vessel was punctured with a 21 gauge micropuncture needle. Using standard technique, the initial micro needle was exchanged over a 0.018 micro wire for a transitional 4 [place] micro sheath. The micro sheath was then exchanged over a 0.035 wire for a 5 French vascular sheath. A C2 cobra catheter was advanced over a Bentson wire and used to select the celiac artery. A celiac arteriogram was performed. No evidence of active bleeding. No evidence of replaced colic artery. The C2 cobra catheter was next advanced into the superior mesenteric artery. A arteriogram was performed. There is evidence of active bleeding arising from the terminal branch in the ascending colon as expected from the prior CTA. A Cook can tot a microcatheter was advanced into the ileocolic. An arteriogram was performed. The active bleeding branch does not appear to arise from the distal ileo colic artery. The microcatheter was next advanced into the right colic artery. Arteriography was performed. The hemorrhage is again visualized. Have appears to arise at the anti mesenteric border of the colon interval region of potentially several feeding distal branch arteries. The microcatheter was advanced into the first potential arterial feeder. Contrast injection confirms active hemorrhage. Coil embolization was performed utilizing a series of 1 and 2 mm low-profile penumbra detachable coils. The microcatheter was brought back. Contrast injection was performed. The embolized vessel  is successfully embolized. However, additional bleeding is occurring from a more cephalad branch. The microcatheter was successfully navigated into this branch. Contrast injection was  performed confirming active bleeding. Coil embolization was then performed. During the coil embolization there was some extravasation of contrast likely from a coil protruding through the very small arterial wall. Additional coil embolization was performed with successful cessation of hemorrhage. Follow-up contrast injection again performed there is a third tiny distal branch artery still contributing 2 bleeding at the diverticular site. The microcatheter was advanced into this third artery and additional coil embolization was performed. As before, a combination of low-profile penumbra detachable coils in 2 and 3 mm diameters were deployed. Final arteriography demonstrates cessation bleeding. The catheter system was removed. Hemostasis was attained with the assistance of a Celt arterial closure device. IMPRESSION: Successful embolization of distal branches of the right colic artery which were providing blood flow to the hemorrhaging ascending colonic diverticulum. No evidence of continued bleeding at the end of the procedure. Electronically Signed   By: Malachy Moan M.D.   On: 07/04/2023 15:34   IR Angiogram Selective Each Additional Vessel  Result Date: 07/04/2023 INDICATION: 52 year old male on Coumadin with for a prior aortic valve replacement. 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 EXAM: IR EMBO ART VEN HEMORR LYMPH EXTRAV INC GUIDE ROADMAPPING; ADDITIONAL ARTERIOGRAPHY MEDICATIONS: None. ANESTHESIA/SEDATION: Moderate (conscious) sedation was employed during this procedure. A total of Versed 2 mg and Fentanyl 50 mcg was administered intravenously by radiology nursing. Moderate Sedation Time: 70 minutes. The patient's level of consciousness and vital signs  were monitored continuously by radiology nursing throughout the procedure under my direct supervision. CONTRAST:  100 mL Omnipaque 300 FLUOROSCOPY: Radiation Exposure Index (as provided by the fluoroscopic device): 2,922 mGy Kerma COMPLICATIONS: None immediate. PROCEDURE: Informed consent was obtained from the patient following explanation of the procedure, risks, benefits and alternatives. The patient understands, agrees and consents for the procedure. All questions were addressed. A time out was performed prior to the initiation of the procedure. Maximal barrier sterile technique utilized including caps, mask, sterile gowns, sterile gloves, large sterile drape, hand hygiene, and Betadine prep. The right common femoral artery was interrogated with ultrasound and found to be widely patent. An image was obtained and stored for the medical record. Local anesthesia was attained by infiltration with 1% lidocaine. A small dermatotomy was made. Under real-time sonographic guidance, the vessel was punctured with a 21 gauge micropuncture needle. Using standard technique, the initial micro needle was exchanged over a 0.018 micro wire for a transitional 4 [place] micro sheath. The micro sheath was then exchanged over a 0.035 wire for a 5 French vascular sheath. A C2 cobra catheter was advanced over a Bentson wire and used to select the celiac artery. A celiac arteriogram was performed. No evidence of active bleeding. No evidence of replaced colic artery. The C2 cobra catheter was next advanced into the superior mesenteric artery. A arteriogram was performed. There is evidence of active bleeding arising from the terminal branch in the ascending colon as expected from the prior CTA. A Cook can tot a microcatheter was advanced into the ileocolic. An arteriogram was performed. The active bleeding branch does not appear to arise from the distal ileo colic artery. The microcatheter was next advanced into the right colic artery.  Arteriography was performed. The hemorrhage is again visualized. Have appears to arise at the anti mesenteric border of the colon interval region of potentially several feeding distal branch arteries. The microcatheter was advanced into the first potential arterial feeder. Contrast injection confirms active hemorrhage. Coil embolization was  performed utilizing a series of 1 and 2 mm low-profile penumbra detachable coils. The microcatheter was brought back. Contrast injection was performed. The embolized vessel is successfully embolized. However, additional bleeding is occurring from a more cephalad branch. The microcatheter was successfully navigated into this branch. Contrast injection was performed confirming active bleeding. Coil embolization was then performed. During the coil embolization there was some extravasation of contrast likely from a coil protruding through the very small arterial wall. Additional coil embolization was performed with successful cessation of hemorrhage. Follow-up contrast injection again performed there is a third tiny distal branch artery still contributing 2 bleeding at the diverticular site. The microcatheter was advanced into this third artery and additional coil embolization was performed. As before, a combination of low-profile penumbra detachable coils in 2 and 3 mm diameters were deployed. Final arteriography demonstrates cessation bleeding. The catheter system was removed. Hemostasis was attained with the assistance of a Celt arterial closure device. IMPRESSION: Successful embolization of distal branches of the right colic artery which were providing blood flow to the hemorrhaging ascending colonic diverticulum. No evidence of continued bleeding at the end of the procedure. Electronically Signed   By: Malachy Moan M.D.   On: 07/04/2023 15:34   CT ANGIO GI BLEED  Result Date: 07/04/2023 CLINICAL DATA:  Bloody stools since this morning. Patient on Eliquis. Abdominal  aortic aneurysm, postop. EXAM: CTA ABDOMEN AND PELVIS WITHOUT AND WITH CONTRAST TECHNIQUE: Multidetector CT imaging of the abdomen and pelvis was performed using the standard protocol during bolus administration of intravenous contrast. Multiplanar reconstructed images and MIPs were obtained and reviewed to evaluate the vascular anatomy. RADIATION DOSE REDUCTION: This exam was performed according to the departmental dose-optimization program which includes automated exposure control, adjustment of the mA and/or kV according to patient size and/or use of iterative reconstruction technique. CONTRAST:  OMNIPAQUE IOHEXOL 350 MG/ML SOLN COMPARISON:  03/21/2023 FINDINGS: VASCULAR Aorta: Normal caliber aorta without aneurysm, dissection, vasculitis or significant stenosis. Celiac: Patent without evidence of aneurysm, dissection, vasculitis or significant stenosis. SMA: Patent without evidence of aneurysm, dissection, vasculitis or significant stenosis. Renals: Both renal arteries are patent without evidence of aneurysm, dissection, vasculitis, fibromuscular dysplasia or significant stenosis. IMA: Patent without evidence of aneurysm, dissection, vasculitis or significant stenosis. Inflow: Patent without evidence of aneurysm, dissection, vasculitis or significant stenosis. Proximal Outflow: Bilateral common femoral and visualized portions of the superficial and profunda femoral arteries are patent without evidence of aneurysm, dissection, vasculitis or significant stenosis. Veins: No obvious venous abnormality within the limitations of this arterial phase study. Review of the MIP images confirms the above findings. NON-VASCULAR Lower chest: Minimal linear atelectasis/scarring in the lung bases. Median sternotomy wires are present. Partially visualized hardware over the aortic root. Hepatobiliary: Previous cholecystectomy. Subcentimeter cyst over the dome of the liver. Biliary tree is normal. Pancreas: Normal. Spleen:  Previous splenectomy. Several small splenic remnants over the left lateral abdomen unchanged. Adrenals/Urinary Tract: Adrenal glands are normal. Kidneys are normal in size without hydronephrosis or nephrolithiasis. Ureters and bladder are unremarkable. Stomach/Bowel: Surgical clips over the proximal stomach. Small bowel is within normal. Appendix is normal. There is significant diverticulosis over the ascending colon as well as the distal descending and sigmoid colon. No definite diverticulitis. The arterial phase images demonstrate hyperdense material within the midportion of the ascending colon with mild pooling within the dependent portion of the ascending colon as this appears to be originating from a posterior diverticula compatible with acute bleed. The venous phase images again demonstrate  this hyperdense material which is more dense and concentrated along the anterior aspect of the ascending colon at the base of several additional diverticula. No evidence of perforation/free peritoneal air. Lymphatic: No adenopathy. Reproductive: Normal. Other: Postsurgical change of the anterior abdominal wall likely from previous hernia repair. Musculoskeletal: Old pelvic fractures. Mild degenerative change of the hips and spine. IMPRESSION: 1. Acute bleed within the midportion of the ascending colon likely originating from a posterior diverticula, although more concentrated adjacent the base of an anterior diverticula in this same region on the delayed images. No evidence of perforation/free peritoneal air. 2. Significant diverticulosis over the ascending colon as well as the distal descending and sigmoid colon. No acute diverticulitis. 3. Previous cholecystectomy and splenectomy. Subcentimeter hepatic cyst. Critical Value/emergent results were called by telephone at the time of interpretation on 07/04/2023 at 1:07 pm to provider Lauren Roemildt, who verbally acknowledged these results. Electronically Signed   By: Elberta Fortis M.D.   On: 07/04/2023 13:09   DG Chest 2 View  Result Date: 06/25/2023 CLINICAL DATA:  Aortic valve replacement EXAM: CHEST - 2 VIEW COMPARISON:  Chest x-ray dated May 11, 2023 FINDINGS: Cardiac and mediastinal contours within normal limits status post median sternotomy and aortic valve replacement. Lungs are clear. No evidence of pleural effusion or pneumothorax. IMPRESSION: No active cardiopulmonary disease. Electronically Signed   By: Allegra Lai M.D.   On: 06/25/2023 11:04    Labs: BNP (last 3 results) No results for input(s): "BNP" in the last 8760 hours. Basic Metabolic Panel: Recent Labs  Lab 07/05/23 0739 07/06/23 0310 07/07/23 0313 07/08/23 0410 07/09/23 0529  NA 133* 133* 131* 129* 133*  K 4.0 3.9 3.8 3.6 3.4*  CL 105 101 101 100 101  CO2 22 25 24 22 24   GLUCOSE 113* 103* 93 100* 90  BUN 20 15 14 10 13   CREATININE 1.48* 1.38* 1.41* 1.13 1.41*  CALCIUM 7.8* 7.9* 8.0* 8.0* 8.3*   Liver Function Tests: Recent Labs  Lab 07/04/23 1130  AST 17  ALT 24  ALKPHOS 56  BILITOT 0.7  PROT 6.6  ALBUMIN 3.6   No results for input(s): "LIPASE", "AMYLASE" in the last 168 hours. No results for input(s): "AMMONIA" in the last 168 hours. CBC: Recent Labs  Lab 07/05/23 0739 07/05/23 1031 07/06/23 0310 07/06/23 1747 07/07/23 0313 07/08/23 0410 07/09/23 0529  WBC 12.3*  --  13.9*  --  11.9* 9.1 7.5  HGB 10.4*   < > 9.7* 9.2* 9.2* 9.4* 8.5*  HCT 31.6*   < > 29.5* 28.5* 28.0* 29.2* 25.9*  MCV 92.1  --  94.2  --  92.4 93.3 93.5  PLT 224  --  234  --  239 264 316   < > = values in this interval not displayed.    Recent Labs  Lab 07/04/23 1140  GLUCAP 126*    No results for input(s): "VITAMINB12", "FOLATE", "FERRITIN", "TIBC", "IRON", "RETICCTPCT" in the last 72 hours. Urinalysis    Component Value Date/Time   COLORURINE BROWN (A) 05/29/2023 1655   APPEARANCEUR CLOUDY (A) 05/29/2023 1655   LABSPEC 1.025 05/29/2023 1655   PHURINE 5.5 05/29/2023 1655    GLUCOSEU NEGATIVE 05/29/2023 1655   HGBUR LARGE (A) 05/29/2023 1655   BILIRUBINUR NEGATIVE 05/29/2023 1655   BILIRUBINUR negative 02/10/2017 1226   KETONESUR NEGATIVE 05/29/2023 1655   PROTEINUR 100 (A) 05/29/2023 1655   UROBILINOGEN 0.2 02/10/2017 1226   NITRITE NEGATIVE 05/29/2023 1655   LEUKOCYTESUR NEGATIVE 05/29/2023 1655  Sepsis Labs Recent Labs  Lab 07/06/23 0310 07/07/23 0313 07/08/23 0410 07/09/23 0529  WBC 13.9* 11.9* 9.1 7.5   Microbiology Recent Results (from the past 240 hour(s))  MRSA Next Gen by PCR, Nasal     Status: None   Collection Time: 07/04/23  4:46 PM   Specimen: Nasal Mucosa; Nasal Swab  Result Value Ref Range Status   MRSA by PCR Next Gen NOT DETECTED NOT DETECTED Final    Comment: (NOTE) The GeneXpert MRSA Assay (FDA approved for NASAL specimens only), is one component of a comprehensive MRSA colonization surveillance program. It is not intended to diagnose MRSA infection nor to guide or monitor treatment for MRSA infections. Test performance is not FDA approved in patients less than 18 years old. Performed at [facility], [address] [place], [place] [address]      Time coordinating discharge: 35 minutes  SIGNED: Lanae Boast, MD  [facility] 07/09/2023, 9:44 AM  If 7PM-7AM, please contact night-coverage [email or web address]

## 2023-07-09 NOTE — Progress Notes (Signed)
   Cased discussed with Dr. Jonathon Bellows - would advise warfarin only at d/c without need for bridging given recent LGIB. 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 Would target INR to 2.5-3.0 if possible to minimize adverse bleeding risk.  [facility] will sign off.   Medication Recommendations:  as above Other recommendations (labs, testing, etc):  none Follow up as an outpatient:  Nl anticoagulation clinic and Dr. Elias Else, MD, [facility], FACP  [place]  [facility]  Medical Director of the Advanced Lipid Disorders &  Cardiovascular Risk Reduction Clinic Diplomate of the American Board of Clinical Lipidology Attending Cardiologist  Direct Dial: [phone]  Fax: [phone]  Website:  [email or web address]

## 2023-07-11 ENCOUNTER — Encounter (HOSPITAL_COMMUNITY): Payer: Self-pay | Admitting: Gastroenterology

## 2023-07-11 ENCOUNTER — Encounter: Payer: Self-pay | Admitting: Gastroenterology

## 2023-07-12 ENCOUNTER — Telehealth: Payer: Self-pay | Admitting: *Deleted

## 2023-07-12 ENCOUNTER — Telehealth: Payer: Self-pay

## 2023-07-12 ENCOUNTER — Ambulatory Visit: Payer: Self-pay | Admitting: *Deleted

## 2023-07-12 ENCOUNTER — Telehealth: Payer: Self-pay | Admitting: Gastroenterology

## 2023-07-12 ENCOUNTER — Other Ambulatory Visit: Payer: Self-pay | Admitting: Physician Assistant

## 2023-07-12 ENCOUNTER — Other Ambulatory Visit: Payer: Self-pay | Admitting: Cardiology

## 2023-07-12 ENCOUNTER — Other Ambulatory Visit (HOSPITAL_COMMUNITY): Payer: Self-pay

## 2023-07-12 ENCOUNTER — Other Ambulatory Visit: Payer: Self-pay

## 2023-07-12 MED ORDER — METOPROLOL SUCCINATE ER 25 MG PO TB24: 12.5000 mg | ORAL_TABLET | Freq: Every day | ORAL | Status: DC

## 2023-07-12 NOTE — Progress Notes (Signed)
Medication list updated.

## 2023-07-12 NOTE — Telephone Encounter (Signed)
Medication list updated.

## 2023-07-12 NOTE — Telephone Encounter (Signed)
I spoke with the pt and he states he had colon while in the hospital on 9/19.  He was concerned that he has not had a BM since.  I did explain that it can take a week or so for his bowel habits to return to normal. He will increase his water intake and begin 1 dose of miralax daily until he has a BM. He knows to titrate as needed to maintain good bowel habits.  He will call back if he has any other concerns

## 2023-07-12 NOTE — Telephone Encounter (Signed)
  Chief Complaint: hemorrhoids  Symptoms: constipation, lump at anus Frequency: constipation since Thursday Pertinent Negatives: Patient denies pain, itching Disposition: [] ED /[] Urgent Care (no appt availability in office) / [] Appointment(In office/virtual)/ []  [facility]/ [] Home Care/ [] Refused Recommended Disposition /[] [facility]/ [x]  Follow-up with PCP Additional Notes: Patient advised needs to contact GI provider- may also call PCP. Go to ED if bleeding, pain- no BM after using medication provided.

## 2023-07-12 NOTE — Telephone Encounter (Signed)
Inbound call from patient stating he has not had a bowel movement in 4 day after 9/19 procedure. States he felt a lump today. Patient requesting a call back as soon as possible to discuss if there is something he can take for release. Please advise, thank you.

## 2023-07-12 NOTE — Transitions of Care (Post Inpatient/ED Visit) (Signed)
07/12/2023  Name: Timothy Browning MRN: [id number] DOB: 17-Jul-1971  Today's TOC FU Call Status: Today's TOC FU Call Status:: Successful TOC FU Call Completed TOC FU Call Complete Date: 07/12/23 Patient's Name and Date of Birth confirmed.  Transition Care Management Follow-up Telephone Call Date of Discharge: 07/09/23 Discharge Facility: [facility]) Type of Discharge: Inpatient Admission Primary Inpatient Discharge Diagnosis:: diverticulosis How have you been since you were released from the hospital?: Better Any questions or concerns?: No  Items Reviewed: Did you receive and understand the discharge instructions provided?: Yes Medications obtained,verified, and reconciled?: Yes (Medications Reviewed) Any new allergies since your discharge?: No Dietary orders reviewed?: Yes Do you have support at home?: No  Medications Reviewed Today: Medications Reviewed Today     Reviewed by Karena Addison, LPN (Licensed Practical Nurse) on 07/12/23 at 1041  Med List Status: <None>   Medication Order Taking? Sig Documenting Provider Last Dose Status Informant  amiodarone (PACERONE) 200 MG tablet [id number] No Amiodarone 400 mg (two tablets) twice daily for 1 week, THEN 200 twice daily until seen in office  Patient taking differently: Take 200 mg by mouth 2 (two) times daily.   Thomasene Ripple, DO 07/03/2023 Active Self, Pharmacy Records  aspirin EC 81 MG tablet [id number] No Take 1 tablet (81 mg total) by mouth daily. Swallow whole. Leary Roca, PA-C 07/03/2023 Active Self, Pharmacy Records  cyclobenzaprine (FLEXERIL) 5 MG tablet [id number] No Take 5 mg by mouth 3 (three) times daily as needed for muscle spasms. [provider] unk Active Self, Pharmacy Records  metoprolol succinate (TOPROL-XL) 25 MG 24 hr tablet [id number] No Take 0.5 tablets (12.5 mg total) by mouth in the morning and at bedtime. Thomasene Ripple, DO 07/03/2023 Active Self, Pharmacy Records           Med Note  Lia Hopping, Marylen Ponto   Sun Jul 04, 2023  5:27 PM) Pt verified he takes 12.5 mg bid  rosuvastatin (CRESTOR) 5 MG tablet [id number] No Take 1 tablet (5 mg total) by mouth daily.  Patient taking differently: Take 5 mg by mouth at bedtime.   Masters, Katie, DO 07/03/2023 Active Self, Pharmacy Records  sildenafil (VIAGRA) 25 MG tablet [id number] No Take 100 mg by mouth daily as needed for erectile dysfunction. [provider] unk Active Self, Pharmacy Records  warfarin (COUMADIN) 2.5 MG tablet [id number] No Take 2 tablets (5 mg total) by mouth daily. Or as directed by the Coumadin Clinic Parke Poisson 07/03/2023 1900 Active Self, Pharmacy Records           Med Note Lia Hopping, Marylen Ponto   Sun Jul 04, 2023  5:27 PM) Pt verified dosage            Home Care and Equipment/Supplies: Were Home Health Services Ordered?: NA Any new equipment or medical supplies ordered?: NA  Functional Questionnaire: Do you need assistance with bathing/showering or dressing?: No Do you need assistance with meal preparation?: No Do you need assistance with eating?: No Do you have difficulty maintaining continence: No Do you need assistance with getting out of bed/getting out of a chair/moving?: No Do you have difficulty managing or taking your medications?: No  Follow up appointments reviewed: PCP Follow-up appointment confirmed?: Yes Date of PCP follow-up appointment?: 07/14/23 Follow-up Provider: [facility] Follow-up appointment confirmed?: NA Do you need transportation to your follow-up appointment?: No Do you understand care options if your condition(s) worsen?: Yes-patient verbalized understanding    SIGNATURE Karena Addison, LPN [facility]  Nurse Health Advisor Direct Dial [phone]

## 2023-07-12 NOTE — Telephone Encounter (Signed)
Left message on machine to call back

## 2023-07-12 NOTE — Telephone Encounter (Signed)
Call from pt who stated he was in the hospital for a procedure to stop rectal bleeding then colonoscopy was done. After the colonoscopy, he was told to call his PCP if he does not have a BM. Pt stated no BM in 4 days. Stated he called GI office but has not heard back from them yet. He's on Coumadin. Also he has noticed a "soft lump" at his rectal area. He has an appt on 9/25 but he wants to know what can he take now for the constipation? Thanks

## 2023-07-12 NOTE — Telephone Encounter (Signed)
Summary: Hemmorid Advice   Pt is calling to report he was discharged on 07/09/23 GI bleed. Pt is reporting constipation. Feels a lump or sack. Read on AVS about a Hemorid. But no one discussed a Hemorid with him. Pt would like to know what to do? Please advise         Reason for Disposition  Last bowel movement (BM) > 4 days ago  Answer Assessment - Initial Assessment Questions 1. SYMPTOM:  "What's the main symptom you're concerned about?" (e.g., pain, itching, swelling, rash)     Constipation, lump next to anus 2. ONSET: "When did the constipation  start?"     Thursday- has had urges 3. RECTAL PAIN: "Do you have any pain around your rectum?" "How bad is the pain?"  (Scale 0-10; or mild, moderate, severe)   - NONE (0): no pain   - MILD (1-3): doesn't interfere with normal activities    - MODERATE (4-7): interferes with normal activities or awakens from sleep, limping    - SEVERE (8-10): excruciating pain, unable to have a bowel movement      no 4. RECTAL ITCHING: "Do you have any itching in this area?" "How bad is the itching?"  (Scale 0-10; or mild, moderate, severe)   - NONE: no itching   - MILD: doesn't interfere with normal activities    - MODERATE-SEVERE: interferes with normal activities or awakens from sleep     no 5. CONSTIPATION: "Do you have constipation?" If Yes, ask: "How bad is it?"     Yes- patient took Mira lax today 6. CAUSE: "What do you think is causing the anus symptoms?"     Possible hemorrhoids  7. OTHER SYMPTOMS: "Do you have any other symptoms?"  (e.g., abdomen pain, fever, rectal bleeding, vomiting)     no  Protocols used: Rectal Symptoms-A-AH

## 2023-07-13 ENCOUNTER — Ambulatory Visit: Payer: Medicaid Other | Attending: Cardiovascular Disease | Admitting: *Deleted

## 2023-07-13 ENCOUNTER — Other Ambulatory Visit (HOSPITAL_COMMUNITY): Payer: Self-pay

## 2023-07-13 DIAGNOSIS — Z952 Presence of prosthetic heart valve: Secondary | ICD-10-CM

## 2023-07-13 DIAGNOSIS — Z5181 Encounter for therapeutic drug level monitoring: Secondary | ICD-10-CM | POA: Insufficient documentation

## 2023-07-13 DIAGNOSIS — I480 Paroxysmal atrial fibrillation: Secondary | ICD-10-CM | POA: Diagnosis not present

## 2023-07-13 DIAGNOSIS — I4891 Unspecified atrial fibrillation: Secondary | ICD-10-CM

## 2023-07-13 LAB — POCT INR: INR: 1.5 — AB (ref 2.0–3.0)

## 2023-07-13 NOTE — Patient Instructions (Addendum)
Description   Today take 3 tablets of warfarin and take 2.5 tablets of warfarin tomorrow then continue taking warfarin 2 TABLETS DAILY.  Recheck INR in 1 week. Stay consistent with greens each week-2 to 3 times per week. Coumadin Clinic [phone] On 8/9 pt STARTED taking amio 400mg  BIDX1 week and then is going to 200mg  BID.

## 2023-07-13 NOTE — Telephone Encounter (Signed)
Pt was called and informed to try Colace which is OTC. He stated GI had called him back and informed him what to try.

## 2023-07-14 ENCOUNTER — Other Ambulatory Visit: Payer: Self-pay | Admitting: Student

## 2023-07-14 ENCOUNTER — Encounter: Payer: Self-pay | Admitting: Student

## 2023-07-14 ENCOUNTER — Ambulatory Visit: Payer: Medicaid Other | Admitting: Student

## 2023-07-14 ENCOUNTER — Other Ambulatory Visit (HOSPITAL_COMMUNITY): Payer: Self-pay

## 2023-07-14 VITALS — BP 139/78 | HR 86 | Temp 98.9°F | Ht 72.0 in | Wt 230.0 lb

## 2023-07-14 DIAGNOSIS — K922 Gastrointestinal hemorrhage, unspecified: Secondary | ICD-10-CM

## 2023-07-14 DIAGNOSIS — N529 Male erectile dysfunction, unspecified: Secondary | ICD-10-CM

## 2023-07-14 DIAGNOSIS — N1831 Chronic kidney disease, stage 3a: Secondary | ICD-10-CM | POA: Diagnosis not present

## 2023-07-14 DIAGNOSIS — Z7982 Long term (current) use of aspirin: Secondary | ICD-10-CM | POA: Diagnosis not present

## 2023-07-14 DIAGNOSIS — Z7901 Long term (current) use of anticoagulants: Secondary | ICD-10-CM

## 2023-07-14 DIAGNOSIS — I4891 Unspecified atrial fibrillation: Secondary | ICD-10-CM

## 2023-07-14 DIAGNOSIS — I129 Hypertensive chronic kidney disease with stage 1 through stage 4 chronic kidney disease, or unspecified chronic kidney disease: Secondary | ICD-10-CM | POA: Diagnosis not present

## 2023-07-14 DIAGNOSIS — I10 Essential (primary) hypertension: Secondary | ICD-10-CM

## 2023-07-14 DIAGNOSIS — E785 Hyperlipidemia, unspecified: Secondary | ICD-10-CM | POA: Diagnosis not present

## 2023-07-14 NOTE — Patient Instructions (Signed)
Thank you, Timothy.Timothy Browning for allowing [place] to provide your care today. Today we discussed :  Lower GI bleed -Please continue to take stool softeners as needed for your bowel movements -We will recheck your hemoglobin and will notify you via call. -If you have any bright red blood in stool or any dark brisk stool, please contact our clinic or go to the ED to be evaluated.  A-fib/mechanical aortic valve, on Coumadin -Please continue to go to Coumadin clinic weekly and get your INR level checked.  Please continue to take warfarin as prescribed by the Coumadin clinic.  If you note any signs or symptoms of active bleed or easy bruising please contact our clinic.  Hypertension -Please continue to take metoprolol 12.5 mg in the morning for now until you see your cardiologist on October 3.  Please continue to record blood pressure readings and the blood pressure log.  Chronic kidney disease -We will recheck your creatinine levels in office today and will call you to notify you on the results.  ED: Please continue to take sildenafil as needed  Please continue to take your Crestor for high cholesterol.  I have ordered the following labs for you:  Lab Orders         BMP8+Anion Gap         CBC no Diff      Tests ordered today:  Basic metabolic panel and CBC, will follow-up once results are available.  Referrals ordered today:   Referral Orders  No referral(s) requested today     I have ordered the following medication/changed the following medications:   Stop the following medications: There are no discontinued medications.   Start the following medications: No orders of the defined types were placed in this encounter.    Follow up:  1 month     Remember:   Should you have any questions or concerns please call the internal medicine clinic at [phone].     Jeral Pinch, DO [facility]

## 2023-07-14 NOTE — Assessment & Plan Note (Signed)
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 Patient recently went to coumadin clinic on September 24 and had INR checked at INR of 1.5, not at goal (2.5 to 3.0).  They recommended the following: take 3 tablets of warfarin 9/24 and take 2.5 tablets of warfarin 9/25, then continue taking warfarin 2 TABLETS DAILY.  Recheck INR in 1 week with coumadin clinic.  Otherwise patient denies any acute signs or symptoms of bleeding and easy bruising. - Continue Amiodarone 200 mg twice daily - Continue Aspirin 81 mg daily - Cardiac rehab referral pending - Patient has a follow-up cardiology appointment on July 22, 2023

## 2023-07-14 NOTE — Assessment & Plan Note (Signed)
Last creatinine on 9/20 upon discharge was elevated from 1.13 to 1.41, his baseline creatinine seems to be around 1.2 to 1.3; Will recheck BMP at this visit.  -Recheck creatinine and follow up on result.

## 2023-07-14 NOTE — Assessment & Plan Note (Signed)
BP Readings from Last 3 Encounters:  07/14/23 139/78  07/09/23 131/85  06/25/23 (!) 150/85    Patient brought his BP log, notes drop in BP to 110s/ 60s after taking BP meds in the morning.  He was taking metoprolol 12.5 mg once in the morning and once in the evening.  Once he noticed low BP readings he contacted cardiology who recommended taking metoprolol 12.5 mg only in the morning.  Otherwise he denies any recent headaches, chest pain, blurring of vision, shortness of breath, lower extremity edema.  He has a cardiologist appointment is coming up on October 3. -Continue metoprolol 12.5 mg only in the morning -Continue monitoring for your blood pressure at home.
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
## 2023-07-14 NOTE — Assessment & Plan Note (Signed)
Continue taking Crestor 5 mg daily.

## 2023-07-14 NOTE — Assessment & Plan Note (Signed)
Recently was hospitalized on 07/04/2023 for rectal bleeding and was found to have acute blood loss anemia and lower GI bleed. CT angio showed acute bleed within the midportion of the ascending colon originating from the posterior diverticula. IR performed emergent angiogram with success stabilization of the ascending colonic diverticula. Colonoscopy on 9/19 showed a single (solitary) ulcer in the proximal ascending colon. Biopsied. Four 6 to 7 mm polyps in the sigmoid colon, in the descending colon and in the cecum, removed with a cold snare. Moderate diverticulosis in the entire examined colon. Internal hemorrhoids. Pathology report negative for any high-grade dysplasia and carcinoma.  Last hemoglobin per chart review was 8.5 on 9/20. Patient reports taking colace that has helped with his bowel movements. Pt denies any Hematochezia, melena, brisk bleed, abdominal pain. Does note some dizziness and lightheadedness last sat and sun after discharge which has resolved in the last couple days. Pt reports he is waiting for an appointment with the GI.  -We will recheck hemoglobin at this visit.

## 2023-07-14 NOTE — Assessment & Plan Note (Signed)
Continue taking Sildenafil as needed.

## 2023-07-15 LAB — BMP8+ANION GAP
Anion Gap: 13 mmol/L (ref 10.0–18.0)
BUN/Creatinine Ratio: 9 (ref 9–20)
BUN: 12 mg/dL (ref 6–24)
CO2: 22 mmol/L (ref 20–29)
Calcium: 8.9 mg/dL (ref 8.7–10.2)
Chloride: 102 mmol/L (ref 96–106)
Creatinine, Ser: 1.32 mg/dL — ABNORMAL HIGH (ref 0.76–1.27)
Glucose: 85 mg/dL (ref 70–99)
Potassium: 4.3 mmol/L (ref 3.5–5.2)
Sodium: 137 mmol/L (ref 134–144)
eGFR: 65 mL/min/{1.73_m2} (ref >59)

## 2023-07-15 LAB — CBC
Hematocrit: 31.4 % — ABNORMAL LOW (ref 37.5–51.0)
Hemoglobin: 10 g/dL — ABNORMAL LOW (ref 13.0–17.7)
MCH: 30.6 pg (ref 26.6–33.0)
MCHC: 31.8 g/dL (ref 31.5–35.7)
MCV: 96 fL (ref 79–97)
Platelets: 577 10*3/uL — ABNORMAL HIGH (ref 150–450)
RBC: 3.27 x10E6/uL — ABNORMAL LOW (ref 4.14–5.80)
RDW: 13.6 % (ref 11.6–15.4)
WBC: 6.7 10*3/uL (ref 3.4–10.8)

## 2023-07-16 ENCOUNTER — Other Ambulatory Visit (HOSPITAL_COMMUNITY): Payer: Self-pay

## 2023-07-16 MED ORDER — METOPROLOL SUCCINATE ER 25 MG PO TB24
12.5000 mg | ORAL_TABLET | Freq: Every day | ORAL | 2 refills | Status: DC
  Filled 2023-07-16: qty 30, 60d supply, fill #0
  Filled 2023-09-10: qty 30, 60d supply, fill #1
  Filled 2023-11-01: qty 30, 60d supply, fill #2

## 2023-07-16 NOTE — Telephone Encounter (Signed)
12.5 mg daily in a.m. per note from last [facility] office visit.

## 2023-07-19 NOTE — Progress Notes (Signed)
Internal Medicine Clinic Attending  I was physically present during the key portions of the resident provided service and participated in the medical decision making of patient's management care. I reviewed pertinent patient test results.  The assessment, diagnosis, and plan were formulated together and I agree with the documentation in the resident's note.  Lau, Grace, MD  

## 2023-07-19 NOTE — Addendum Note (Signed)
Addended by: Dickie La on: 07/19/2023 10:12 AM   Modules accepted: Level of Service

## 2023-07-20 ENCOUNTER — Ambulatory Visit: Payer: Medicaid Other | Attending: Cardiology | Admitting: *Deleted

## 2023-07-20 DIAGNOSIS — I480 Paroxysmal atrial fibrillation: Secondary | ICD-10-CM

## 2023-07-20 DIAGNOSIS — I4891 Unspecified atrial fibrillation: Secondary | ICD-10-CM | POA: Diagnosis not present

## 2023-07-20 DIAGNOSIS — Z419 Encounter for procedure for purposes other than remedying health state, unspecified: Secondary | ICD-10-CM | POA: Diagnosis not present

## 2023-07-20 DIAGNOSIS — Z952 Presence of prosthetic heart valve: Secondary | ICD-10-CM

## 2023-07-20 DIAGNOSIS — Z5181 Encounter for therapeutic drug level monitoring: Secondary | ICD-10-CM | POA: Diagnosis not present

## 2023-07-20 DIAGNOSIS — G4733 Obstructive sleep apnea (adult) (pediatric): Secondary | ICD-10-CM | POA: Diagnosis not present

## 2023-07-20 LAB — POCT INR: INR: 1.9 — AB (ref 2.0–3.0)

## 2023-07-20 NOTE — Patient Instructions (Signed)
Description   Today take 1 more tablet of warfarin (total 7.5mg , 3 tablets) then START taking warfarin 2 TABLETS DAILY EXCEPT 3 TABLETS ON SATURDAYS.  Recheck INR in 1 week. Stay consistent with greens each week-2 to 3 times per week. Coumadin Clinic [phone] On 8/9 pt STARTED taking amio 400mg  BIDX1 week and then is going to 200mg  BID.

## 2023-07-21 ENCOUNTER — Other Ambulatory Visit: Payer: Self-pay | Admitting: Cardiology

## 2023-07-21 ENCOUNTER — Other Ambulatory Visit (HOSPITAL_COMMUNITY): Payer: Self-pay

## 2023-07-22 ENCOUNTER — Ambulatory Visit: Payer: Medicaid Other | Attending: Cardiology | Admitting: Cardiology

## 2023-07-22 ENCOUNTER — Encounter (HOSPITAL_COMMUNITY): Payer: Self-pay

## 2023-07-22 ENCOUNTER — Other Ambulatory Visit (HOSPITAL_COMMUNITY): Payer: Self-pay

## 2023-07-22 VITALS — BP 124/78 | HR 74 | Ht 72.0 in | Wt 232.0 lb

## 2023-07-22 DIAGNOSIS — Z9889 Other specified postprocedural states: Secondary | ICD-10-CM

## 2023-07-22 DIAGNOSIS — G4733 Obstructive sleep apnea (adult) (pediatric): Secondary | ICD-10-CM

## 2023-07-22 DIAGNOSIS — Z8679 Personal history of other diseases of the circulatory system: Secondary | ICD-10-CM

## 2023-07-22 DIAGNOSIS — I480 Paroxysmal atrial fibrillation: Secondary | ICD-10-CM | POA: Diagnosis not present
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
## 2023-07-22 NOTE — Patient Instructions (Signed)
Medication Instructions:  Your physician has recommended you make the following change in your medication:  STOP: Amiodarone *If you need a refill on your cardiac medications before your next appointment, please call your pharmacy*   Lab Work: None   Testing/Procedures: None   Follow-Up: At [facility], you and your health needs are our priority.  As part of our continuing mission to provide you with exceptional heart care, we have created designated Provider Care Teams.  These Care Teams include your primary Cardiologist (physician) and Advanced Practice Providers (APPs -  Physician Assistants and Nurse Practitioners) who all work together to provide you with the care you need, when you need it.  Your next appointment:   6 month(s)  Provider:   Thomasene Ripple, DO

## 2023-07-27 ENCOUNTER — Telehealth (HOSPITAL_COMMUNITY): Payer: Self-pay

## 2023-07-27 NOTE — Telephone Encounter (Signed)
Pt insurance is active and benefits verified through [facility]. Co-pay $4.00, DED $0.00/$0.00 met, out of pocket $0.00/$0.00 met, co-insurance 0%. No pre-authorization required. [place] [employer], 07/27/23 @ 2:14PM, GMW#[id number]   How many CR sessions are covered? 36 visits only ICR Is this a lifetime maximum or an annual maximum? Annual Has the member used any of these services to date? No Is there a time limit (weeks/months) on start of program and/or program completion? No

## 2023-07-27 NOTE — Telephone Encounter (Signed)
Called patient to see if he was interested in participating in the Cardiac Rehab Program. Patient stated yes. Patient will come in for orientation on 08/03/23 @ 10:30AM and will attend the 10:15AM exercise class.   Pensions consultant.

## 2023-07-28 ENCOUNTER — Ambulatory Visit: Payer: Medicaid Other | Attending: Cardiovascular Disease | Admitting: *Deleted

## 2023-07-28 ENCOUNTER — Ambulatory Visit (INDEPENDENT_AMBULATORY_CARE_PROVIDER_SITE_OTHER): Payer: Medicaid Other | Admitting: Licensed Clinical Social Worker

## 2023-07-28 DIAGNOSIS — Z952 Presence of prosthetic heart valve: Secondary | ICD-10-CM

## 2023-07-28 DIAGNOSIS — I480 Paroxysmal atrial fibrillation: Secondary | ICD-10-CM

## 2023-07-28 DIAGNOSIS — Z5181 Encounter for therapeutic drug level monitoring: Secondary | ICD-10-CM | POA: Diagnosis not present

## 2023-07-28 DIAGNOSIS — R4589 Other symptoms and signs involving emotional state: Secondary | ICD-10-CM

## 2023-07-28 DIAGNOSIS — I4891 Unspecified atrial fibrillation: Secondary | ICD-10-CM

## 2023-07-28 LAB — POCT INR: INR: 2.5 (ref 2.0–3.0)

## 2023-07-28 NOTE — Patient Instructions (Signed)
Description   Continue taking warfarin 2 TABLETS DAILY EXCEPT 3 TABLETS ON SATURDAYS.  Recheck INR in 1 week. Stay consistent with greens each week-2 to 3 times per week. Coumadin Clinic [phone] On 05/28/23 pt STARTED taking amio 400mg  BIDX1 week and then is going to 200mg  BID, Amio stopped on 07/22/23.

## 2023-07-28 NOTE — BH Specialist Note (Signed)
Integrated Behavioral Health via Telemedicine Visit  07/28/2023 Timothy Browning [id number]  Number of Integrated Behavioral Health Clinician visits: 1- Initial Visit  Session Start time: 1500   Session End time: 1530  Total time in minutes: 30   Referring Provider: PCP Patient/Family location: home [facility] Provider location: office All persons participating in visit: [facility] and Patient Types of Service: Introduction only  I connected with [name] via  Telephone or Video Enabled Telemedicine Application  (and verified that I am speaking with the correct person using two identifiers. Discussed confidentiality: Yes   I discussed the limitations of telemedicine and the availability of in person appointments.  Discussed there is a possibility of technology failure and discussed alternative modes of communication if that failure occurs.  I discussed that engaging in this telemedicine visit, they consent to the provision of behavioral healthcare and the services will be billed under their insurance.  Patient and/or legal guardian expressed understanding and consented to Telemedicine visit: Yes   Presenting Concerns: Patient and/or family reports the following symptoms/concerns: During the telehealth session, LCSW-A Christen Butter introduced herself as the [employer] Counselor [name] to the patient, who expressed a willingness to engage in services. The patient reported experiencing significant anxiety related to health concerns, which has been impacting daily functioning. [name] provided a supportive environment, encouraging the patient to share their feelings and thoughts about their health anxiety. She discussed potential coping strategies and reassured the patient that they would work together to address these issues in subsequent sessions. The patient left the session feeling more hopeful about managing their anxiety and agreed to schedule follow-up appointments for ongoing  support. Duration of problem: More than a year; Severity of problem: moderate  Patient and/or Family's Strengths/Protective Factors: Sense of purpose  Goals Addressed: Patient will:  Reduce symptoms of: anxiety   Increase knowledge and/or ability of: coping skills   Demonstrate ability to: Increase healthy adjustment to current life circumstances  Progress towards Goals: Ongoing  Interventions: Interventions utilized:  CBT Cognitive Behavioral Therapy Standardized Assessments completed: PHQ-SADS     08/11/2023    1:46 PM 08/03/2023   11:00 AM 06/02/2023   10:11 AM  PHQ-SADS Last 3 Score only  Total GAD-7 Score 13    PHQ Adolescent Score 9 10 0    Patient and/or Family Response: Patient agrees to continued services  Assessment: Patient currently experiencing Anxiety .   Patient may benefit from Individual therapy.  Plan: Follow up with behavioral health clinician on : Within the next 30 days   I discussed the assessment and treatment plan with the patient and/or parent/guardian. They were provided an opportunity to ask questions and all were answered. They agreed with the plan and demonstrated an understanding of the instructions.   They were advised to call back or seek an in-person evaluation if the symptoms worsen or if the condition fails to improve as anticipated.  Christen Butter, MSW, LCSW-A She/Her Behavioral Health Clinician [facility]  Internal Medicine Center Direct Dial:[phone]  Fax [phone] Main Office Phone: [phone] [address] [place] [place], [place] [address] Website: [facility] Internal Medicine [facility]  [place], [place]  

## 2023-07-30 ENCOUNTER — Telehealth (HOSPITAL_COMMUNITY): Payer: Self-pay

## 2023-07-30 NOTE — Telephone Encounter (Signed)
Called patient to confirm Cardiac Rehab appointment for Tuesday, 10/15 at 10:30. RN completed Nursing Assessment with patient. Pt denies s/s of COVID. Directions and instructions provided for the appointment. Pt understands without assistance.

## 2023-08-03 ENCOUNTER — Encounter (HOSPITAL_COMMUNITY)
Admission: RE | Admit: 2023-08-03 | Discharge: 2023-08-03 | Disposition: A | Discharge status: Home / Self Care | Payer: Medicaid Other | Source: Ambulatory Visit | Attending: Cardiology | Admitting: Cardiology

## 2023-08-03 VITALS — BP 128/80 | HR 66 | Ht 72.0 in | Wt 227.7 lb

## 2023-08-03 DIAGNOSIS — Z9889 Other specified postprocedural states: Secondary | ICD-10-CM | POA: Diagnosis not present

## 2023-08-03 DIAGNOSIS — Z952 Presence of prosthetic heart valve: Secondary | ICD-10-CM | POA: Diagnosis not present

## 2023-08-03 NOTE — Progress Notes (Signed)
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 Swallow whole., Disp: 30 tablet, Rfl: 12   cyclobenzaprine (FLEXERIL) 5 MG tablet, Take 5 mg by mouth 3 (three) times daily as needed for muscle spasms., Disp: , Rfl:    metoprolol succinate (TOPROL-XL) 25 MG 24 hr tablet, Take 0.5 tablets (12.5 mg total) by mouth daily with breakfast., Disp: 30 tablet, Rfl: 2   rosuvastatin (CRESTOR) 5 MG tablet, Take 1 tablet (5 mg total) by mouth daily. (Patient taking differently: Take 5 mg by mouth at bedtime.), Disp: 90 tablet, Rfl: 3   sildenafil (VIAGRA) 25 MG tablet, Take 100 mg by mouth daily as needed for erectile dysfunction., Disp: , Rfl:    warfarin (COUMADIN) 2.5 MG tablet, Take 2 tablets (5 mg total) by mouth daily. Or as directed by the [facility], Disp: 60 tablet, Rfl: 11  Past Medical History: Past Medical History:  Diagnosis Date   Aortic aneurysm without rupture (HCC)    Chest pain of uncertain etiology 08/11/2021   CKD (chronic kidney disease)    GERD (gastroesophageal reflux disease)    Heart failure (HCC)    Hypertension    Obesity (BMI 30-39.9) 08/11/2021   Sleep apnea     Tobacco Use: Social History   Tobacco Use  Smoking Status Never  Smokeless Tobacco Never     Labs: Review Flowsheet  More data exists      Latest Ref Rng & Units 07/14/2022 05/04/2023 05/06/2023 05/07/2023 06/02/2023  Labs for ITP Cardiac and Pulmonary Rehab  Cholestrol 100 - 199 mg/dL 409  - - - 811   LDL (calc) 0 - 99 mg/dL 914  - - - 59   HDL-C >78 mg/dL 57  - - - 61   Trlycerides 0 - 149 mg/dL 70  - - - 62   Hemoglobin A1c 4.8 - 5.6 % 5.2  5.4  - - -  PH, Arterial 7.35 - 7.45 - 7.4  7.328  7.299  7.330  7.339  7.377  7.366  7.391  7.329  7.333  -  PCO2 arterial 32 - 48 mmHg - 38  43.1  43.9  41.5  38.0  38.7  38.4  40.8  42.9  45.0  -  Bicarbonate 20.0 - 28.0 mmol/L - 23.5  22.4  21.3  21.8  20.8  22.8  22.0  24.8  22.6  23.7  -  TCO2 22 - 32 mmol/L - - 24  23  23  22  24  24  23  27  26  27  25  24  25   -  Acid-base deficit 0.0 - 2.0 mmol/L - 1.1  3.0  5.0  4.0  5.0  2.0  3.0  3.0  2.0  -  O2 Saturation % - 97.4  92  92  97  95  99  100  100  100  96  -    Details       Multiple values from one day are sorted in reverse-chronological order         Capillary Blood Glucose: Lab Results  Component Value Date   GLUCAP 126 (H) 07/04/2023   GLUCAP 88 05/12/2023   GLUCAP 133 (H) 05/11/2023   GLUCAP 113 (H) 05/11/2023   GLUCAP 114 (H) 05/10/2023     Exercise Target Goals: Exercise Program Goal: Individual exercise prescription set using results from initial 6 min walk test and THRR while considering  patient's activity barriers and safety.   Exercise Prescription Goal: Initial exercise prescription builds to 30-45 minutes a day of aerobic activity, 2-3 days per week.  Home exercise guidelines will be given to patient during program as part of exercise prescription that the participant will acknowledge.  Activity Barriers & Risk Stratification:  Activity Barriers & Cardiac Risk Stratification - 08/03/23 1040       Activity Barriers & Cardiac Risk Stratification   Activity Barriers Back Problems;Other (comment);Balance Concerns    Comments Right foot drop  (wears foot brace), chronic back pain, bilateral knee pain (needs knee replacement).    Cardiac Risk Stratification Low             6 Minute Walk:  6 Minute Walk     Row Name 08/03/23 1143         6 Minute Walk   Phase Initial     Distance 1161 feet     Walk Time 6 minutes     # of Rest Breaks 0     MPH 2.2     METS 3.42     RPE 9     Perceived Dyspnea  1     VO2 Peak 11.97     Symptoms Yes (comment)     Comments Mild shortness of breath. Back pain, chronic with walking, 3/10 on pain scale.     Resting HR 66 bpm     Resting BP 128/80     Resting Oxygen Saturation  100 %     Exercise Oxygen Saturation  during 6 min walk 99 %     Max Ex. HR 86 bpm     Max Ex. BP 132/76     2 Minute Post BP 110/80              Oxygen Initial Assessment:   Oxygen Re-Evaluation:   Oxygen Discharge (Final Oxygen Re-Evaluation):   Initial Exercise Prescription:  Initial Exercise Prescription - 08/03/23 1300       Date of Initial Exercise RX and Referring Provider   Date 08/03/23    Referring Provider Thomasene Ripple, DO    Expected Discharge Date 10/27/23      Recumbant Bike   Level 1    Watts 45    Minutes 15    METs 3.4      T5 Nustep   Level 2    SPM 85    Minutes 15    METs 3      Prescription Details   Frequency (times per week) 3    Duration Progress to 30 minutes of continuous aerobic without signs/symptoms of physical distress      Intensity   THRR 40-80% of Max Heartrate 68-135    Ratings of Perceived Exertion 11-13    Perceived Dyspnea 0-4      Progression  Progression Continue to progress workloads to maintain intensity without signs/symptoms of physical distress.      Resistance Training   Training Prescription Yes    Weight 5 lbs    Reps 10-15             Perform Capillary Blood Glucose checks as needed.  Exercise Prescription Changes:   Exercise Comments:   Exercise Goals and Review:   Exercise Goals     Row Name 08/03/23  1040             Exercise Goals   Increase Physical Activity Yes       Intervention Provide advice, education, support and counseling about physical activity/exercise needs.;Develop an individualized exercise prescription for aerobic and resistive training based on initial evaluation findings, risk stratification, comorbidities and participant's personal goals.       Expected Outcomes Short Term: Attend rehab on a regular basis to increase amount of physical activity.;Long Term: Add in home exercise to make exercise part of routine and to increase amount of physical activity.;Long Term: Exercising regularly at least 3-5 days a week.       Increase Strength and Stamina Yes       Intervention Provide advice, education, support and counseling about physical activity/exercise needs.;Develop an individualized exercise prescription for aerobic and resistive training based on initial evaluation findings, risk stratification, comorbidities and participant's personal goals.       Expected Outcomes Short Term: Increase workloads from initial exercise prescription for resistance, speed, and METs.;Short Term: Perform resistance training exercises routinely during rehab and add in resistance training at home;Long Term: Improve cardiorespiratory fitness, muscular endurance and strength as measured by increased METs and functional capacity ( )       Able to understand and use rate of perceived exertion (RPE) scale Yes       Intervention Provide education and explanation on how to use RPE scale       Expected Outcomes Short Term: Able to use RPE daily in rehab to express subjective intensity level;Long Term:  Able to use RPE to guide intensity level when exercising independently       Knowledge and understanding of Target Heart Rate Range (THRR) Yes       Intervention Provide education and explanation of THRR including how the numbers were predicted and where they are located for reference       Expected  Outcomes Short Term: Able to state/look up THRR;Long Term: Able to use THRR to govern intensity when exercising independently;Short Term: Able to use daily as guideline for intensity in rehab       Able to check pulse independently Yes       Intervention Provide education and demonstration on how to check pulse in carotid and radial arteries.;Review the importance of being able to check your own pulse for safety during independent exercise       Expected Outcomes Short Term: Able to explain why pulse checking is important during independent exercise;Long Term: Able to check pulse independently and accurately       Understanding of Exercise Prescription Yes       Intervention Provide education, explanation, and written materials on patient's individual exercise prescription       Expected Outcomes Short Term: Able to explain program exercise prescription;Long Term: Able to explain home exercise prescription to exercise independently                Exercise Goals Re-Evaluation :   Discharge Exercise Prescription (Final Exercise Prescription  Changes):   Nutrition:  Target Goals: Understanding of nutrition guidelines, daily intake of sodium 1500mg , cholesterol 200mg , calories 30% from fat and 7% or less from saturated fats, daily to have 5 or more servings of fruits and vegetables.  Biometrics:  Pre Biometrics - 08/03/23 1020       Pre Biometrics   Waist Circumference 40.25 inches    Hip Circumference 43.5 inches    Waist to Hip Ratio 0.93 %    Triceps Skinfold 13 mm    % Body Fat 27.2 %    Grip Strength 54 kg    Flexibility 0 in    Single Leg Stand 24.37 seconds              Nutrition Therapy Plan and Nutrition Goals:   Nutrition Assessments:  MEDIFICTS Score Key: >=70 Need to make dietary changes  40-70 Heart Healthy Diet <= 40 Therapeutic Level Cholesterol Diet    Picture Your Plate Scores: <40 Unhealthy dietary pattern with much room for improvement. 41-50  Dietary pattern unlikely to meet recommendations for good health and room for improvement. 51-60 More healthful dietary pattern, with some room for improvement.  >60 Healthy dietary pattern, although there may be some specific behaviors that could be improved.    Nutrition Goals Re-Evaluation:   Nutrition Goals Re-Evaluation:   Nutrition Goals Discharge (Final Nutrition Goals Re-Evaluation):   Psychosocial: Target Goals: Acknowledge presence or absence of significant depression and/or stress, maximize coping skills, provide positive support system. Participant is able to verbalize types and ability to use techniques and skills needed for reducing stress and depression.  Initial Review & Psychosocial Screening:  Initial Psych Review & Screening - 08/03/23 1101       Initial Review   Current issues with Current Stress Concerns    Source of Stress Concerns Unable to participate in former interests or hobbies    Comments Patient started therapy with a counselor at [facility] on 07/29/23.      Family Dynamics   Good Support System? Yes    Comments Patient doesn't talk to friends or family when he's stressed but recently started talking to a counselor.      Barriers   Psychosocial barriers to participate in program The patient should benefit from training in stress management and relaxation.;Psychosocial barriers identified (see note)      Screening Interventions   Interventions Encouraged to exercise;Provide feedback about the scores to participant;To provide support and resources with identified psychosocial needs    Expected Outcomes Short Term goal: Utilizing psychosocial counselor, staff and physician to assist with identification of specific Stressors or current issues interfering with healing process. Setting desired goal for each stressor or current issue identified.;Long Term Goal: Stressors or current issues are controlled or eliminated.;Short Term goal: Identification and  review with participant of any Quality of Life or Depression concerns found by scoring the questionnaire.;Long Term goal: The participant improves quality of Life and PHQ9 Scores as seen by post scores and/or verbalization of changes             Quality of Life Scores:  Quality of Life - 08/03/23 1107       Quality of Life   Select Quality of Life      Quality of Life Scores   Health/Function Pre 16.1 %    Socioeconomic Pre 20.75 %    Psych/Spiritual Pre 17 %    Family Pre 26.4 %    GLOBAL Pre 18.75 %  Scores of 19 and below usually indicate a poorer quality of life in these areas.  A difference of  2-3 points is a clinically meaningful difference.  A difference of 2-3 points in the total score of the Quality of Life Index has been associated with significant improvement in overall quality of life, self-image, physical symptoms, and general health in studies assessing change in quality of life.  PHQ-9: Review Flowsheet  More data exists      08/03/2023 06/02/2023 04/20/2023 03/25/2023 12/23/2022  Depression screen PHQ 2/9  Decreased Interest 2 0 0 0 0  Down, Depressed, Hopeless 2 0 0 0 0  PHQ - 2 Score 4 0 0 0 0  Altered sleeping 1 0 1 - -  Tired, decreased energy 2 0 1 - -  Change in appetite 0 0 0 - -  Feeling bad or failure about yourself  1 0 0 - -  Trouble concentrating 0 - - - -  Moving slowly or fidgety/restless 2 0 0 - -  Suicidal thoughts 0 0 0 - -  PHQ-9 Score 10 0 2 - -  Difficult doing work/chores Somewhat difficult Not difficult at all Not difficult at all - -    Details           Interpretation of Total Score  Total Score Depression Severity:  1-4 = Minimal depression, 5-9 = Mild depression, 10-14 = Moderate depression, 15-19 = Moderately severe depression, 20-27 = Severe depression   Psychosocial Evaluation and Intervention:   Psychosocial Re-Evaluation:   Psychosocial Discharge (Final Psychosocial Re-Evaluation):   Vocational  Rehabilitation: Provide vocational rehab assistance to qualifying candidates.   Vocational Rehab Evaluation & Intervention:  Vocational Rehab - 08/03/23 1127       Initial Vocational Rehab Evaluation & Intervention   Assessment shows need for Vocational Rehabilitation Yes    Vocational Rehab Packet given to patient 08/03/23      Vocational Rehab Re-Evaulation   Comments Anticipating knee surgery but may be interested in vocational rehab services in the future.             Education: Education Goals: Education classes will be provided on a weekly basis, covering required topics. Participant will state understanding/return demonstration of topics presented.     Core Videos: Exercise    Move It!  Clinical staff conducted group or individual video education with verbal and written material and guidebook.  Patient learns the recommended Pritikin exercise program. Exercise with the goal of living a long, healthy life. Some of the health benefits of exercise include controlled diabetes, healthier blood pressure levels, improved cholesterol levels, improved heart and lung capacity, improved sleep, and better body composition. Everyone should speak with their doctor before starting or changing an exercise routine.  Biomechanical Limitations Clinical staff conducted group or individual video education with verbal and written material and guidebook.  Patient learns how biomechanical limitations can impact exercise and how we can mitigate and possibly overcome limitations to have an impactful and balanced exercise routine.  Body Composition Clinical staff conducted group or individual video education with verbal and written material and guidebook.  Patient learns that body composition (ratio of muscle mass to fat mass) is a key component to assessing overall fitness, rather than body weight alone. Increased fat mass, especially visceral belly fat, can put [place] at increased risk for metabolic  syndrome, type 2 diabetes, heart disease, and even death. It is recommended to combine diet and exercise (cardiovascular and resistance training) to improve your  body composition. Seek guidance from your physician and exercise physiologist before implementing an exercise routine.  Exercise Action Plan Clinical staff conducted group or individual video education with verbal and written material and guidebook.  Patient learns the recommended strategies to achieve and enjoy long-term exercise adherence, including variety, self-motivation, self-efficacy, and positive decision making. Benefits of exercise include fitness, good health, weight management, more energy, better sleep, less stress, and overall well-being.  Medical   Heart Disease Risk Reduction Clinical staff conducted group or individual video education with verbal and written material and guidebook.  Patient learns our heart is our most vital organ as it circulates oxygen, nutrients, white blood cells, and hormones throughout the entire body, and carries waste away. Data supports a plant-based eating plan like the Pritikin Program for its effectiveness in slowing progression of and reversing heart disease. The video provides a number of recommendations to address heart disease.   Metabolic Syndrome and Belly Fat  Clinical staff conducted group or individual video education with verbal and written material and guidebook.  Patient learns what metabolic syndrome is, how it leads to heart disease, and how one can reverse it and keep it from coming back. You have metabolic syndrome if you have 3 of the following 5 criteria: abdominal obesity, high blood pressure, high triglycerides, low HDL cholesterol, and high blood sugar.  Hypertension and Heart Disease Clinical staff conducted group or individual video education with verbal and written material and guidebook.  Patient learns that high blood pressure, or hypertension, is very common in the  [place]. Hypertension is largely due to excessive salt intake, but other important risk factors include being overweight, physical inactivity, drinking too much alcohol, smoking, and not eating enough potassium from fruits and vegetables. High blood pressure is a leading risk factor for heart attack, stroke, congestive heart failure, dementia, kidney failure, and premature death. Long-term effects of excessive salt intake include stiffening of the arteries and thickening of heart muscle and organ damage. Recommendations include ways to reduce hypertension and the risk of heart disease.  Diseases of Our Time - Focusing on Diabetes Clinical staff conducted group or individual video education with verbal and written material and guidebook.  Patient learns why the best way to stop diseases of our time is prevention, through food and other lifestyle changes. Medicine (such as prescription pills and surgeries) is often only a Band-Aid on the problem, not a long-term solution. Most common diseases of our time include obesity, type 2 diabetes, hypertension, heart disease, and cancer. The Pritikin Program is recommended and has been proven to help reduce, reverse, and/or prevent the damaging effects of metabolic syndrome.  Nutrition   Overview of the Pritikin Eating Plan  Clinical staff conducted group or individual video education with verbal and written material and guidebook.  Patient learns about the Pritikin Eating Plan for disease risk reduction. The Pritikin Eating Plan emphasizes a wide variety of unrefined, minimally-processed carbohydrates, like fruits, vegetables, whole grains, and legumes. Go, Caution, and Stop food choices are explained. Plant-based and lean animal proteins are emphasized. Rationale provided for low sodium intake for blood pressure control, low added sugars for blood sugar stabilization, and low added fats and oils for coronary artery disease risk reduction and weight  management.  Calorie Density  Clinical staff conducted group or individual video education with verbal and written material and guidebook.  Patient learns about calorie density and how it impacts the Pritikin Eating Plan. Knowing the characteristics of the food you choose will  help you decide whether those foods will lead to weight gain or weight loss, and whether you want to consume more or less of them. Weight loss is usually a side effect of the Pritikin Eating Plan because of its focus on low calorie-dense foods.  Label Reading  Clinical staff conducted group or individual video education with verbal and written material and guidebook.  Patient learns about the Pritikin recommended label reading guidelines and corresponding recommendations regarding calorie density, added sugars, sodium content, and whole grains.  Dining Out - Part 1  Clinical staff conducted group or individual video education with verbal and written material and guidebook.  Patient learns that restaurant meals can be sabotaging because they can be so high in calories, fat, sodium, and/or sugar. Patient learns recommended strategies on how to positively address this and avoid unhealthy pitfalls.  Facts on Fats  Clinical staff conducted group or individual video education with verbal and written material and guidebook.  Patient learns that lifestyle modifications can be just as effective, if not more so, as many medications for lowering your risk of heart disease. A Pritikin lifestyle can help to reduce your risk of inflammation and atherosclerosis (cholesterol build-up, or plaque, in the artery walls). Lifestyle interventions such as dietary choices and physical activity address the cause of atherosclerosis. A review of the types of fats and their impact on blood cholesterol levels, along with dietary recommendations to reduce fat intake is also included.  Nutrition Action Plan  Clinical staff conducted group or individual  video education with verbal and written material and guidebook.  Patient learns how to incorporate Pritikin recommendations into their lifestyle. Recommendations include planning and keeping personal health goals in mind as an important part of their success.  Healthy Mind-Set    Healthy Minds, Bodies, Hearts  Clinical staff conducted group or individual video education with verbal and written material and guidebook.  Patient learns how to identify when they are stressed. Video will discuss the impact of that stress, as well as the many benefits of stress management. Patient will also be introduced to stress management techniques. The way we think, act, and feel has an impact on our hearts.  How Our Thoughts Can Heal Our Hearts  Clinical staff conducted group or individual video education with verbal and written material and guidebook.  Patient learns that negative thoughts can cause depression and anxiety. This can result in negative lifestyle behavior and serious health problems. Cognitive behavioral therapy is an effective method to help control our thoughts in order to change and improve our emotional outlook.  Additional Videos:  Exercise    Improving Performance  Clinical staff conducted group or individual video education with verbal and written material and guidebook.  Patient learns to use a non-linear approach by alternating intensity levels and lengths of time spent exercising to help burn more calories and lose more body fat. Cardiovascular exercise helps improve heart health, metabolism, hormonal balance, blood sugar control, and recovery from fatigue. Resistance training improves strength, endurance, balance, coordination, reaction time, metabolism, and muscle mass. Flexibility exercise improves circulation, posture, and balance. Seek guidance from your physician and exercise physiologist before implementing an exercise routine and learn your capabilities and proper form for all  exercise.  Introduction to Yoga  Clinical staff conducted group or individual video education with verbal and written material and guidebook.  Patient learns about yoga, a discipline of the coming together of mind, breath, and body. The benefits of yoga include improved flexibility, improved range of  motion, better posture and core strength, increased lung function, weight loss, and positive self-image. Yoga's heart health benefits include lowered blood pressure, healthier heart rate, decreased cholesterol and triglyceride levels, improved immune function, and reduced stress. Seek guidance from your physician and exercise physiologist before implementing an exercise routine and learn your capabilities and proper form for all exercise.  Medical   Aging: Enhancing Your Quality of Life  Clinical staff conducted group or individual video education with verbal and written material and guidebook.  Patient learns key strategies and recommendations to stay in good physical health and enhance quality of life, such as prevention strategies, having an advocate, securing a Health Care Proxy and Power of Attorney, and keeping a list of medications and system for tracking them. It also discusses how to avoid risk for bone loss.  Biology of Weight Control  Clinical staff conducted group or individual video education with verbal and written material and guidebook.  Patient learns that weight gain occurs because we consume more calories than we burn (eating more, moving less). Even if your body weight is normal, you may have higher ratios of fat compared to muscle mass. Too much body fat puts you at increased risk for cardiovascular disease, heart attack, stroke, type 2 diabetes, and obesity-related cancers. In addition to exercise, following the Pritikin Eating Plan can help reduce your risk.  Decoding Lab Results  Clinical staff conducted group or individual video education with verbal and written material and  guidebook.  Patient learns that lab test reflects one measurement whose values change over time and are influenced by many factors, including medication, stress, sleep, exercise, food, hydration, pre-existing medical conditions, and more. It is recommended to use the knowledge from this video to become more involved with your lab results and evaluate your numbers to speak with your doctor.   Diseases of Our Time - Overview  Clinical staff conducted group or individual video education with verbal and written material and guidebook.  Patient learns that according to the CDC, 50% to 70% of chronic diseases (such as obesity, type 2 diabetes, elevated lipids, hypertension, and heart disease) are avoidable through lifestyle improvements including healthier food choices, listening to satiety cues, and increased physical activity.  Sleep Disorders Clinical staff conducted group or individual video education with verbal and written material and guidebook.  Patient learns how good quality and duration of sleep are important to overall health and well-being. Patient also learns about sleep disorders and how they impact health along with recommendations to address them, including discussing with a physician.  Nutrition  Dining Out - Part 2 Clinical staff conducted group or individual video education with verbal and written material and guidebook.  Patient learns how to plan ahead and communicate in order to maximize their dining experience in a healthy and nutritious manner. Included are recommended food choices based on the type of restaurant the patient is visiting.   Fueling a Banker conducted group or individual video education with verbal and written material and guidebook.  There is a strong connection between our food choices and our health. Diseases like obesity and type 2 diabetes are very prevalent and are in large-part due to lifestyle choices. The Pritikin Eating Plan  provides plenty of food and hunger-curbing satisfaction. It is easy to follow, affordable, and helps reduce health risks.  Menu Workshop  Clinical staff conducted group or individual video education with verbal and written material and guidebook.  Patient learns that restaurant meals can sabotage  health goals because they are often packed with calories, fat, sodium, and sugar. Recommendations include strategies to plan ahead and to communicate with the manager, chef, or server to help order a healthier meal.  Planning Your Eating Strategy  Clinical staff conducted group or individual video education with verbal and written material and guidebook.  Patient learns about the Pritikin Eating Plan and its benefit of reducing the risk of disease. The Pritikin Eating Plan does not focus on calories. Instead, it emphasizes high-quality, nutrient-rich foods. By knowing the characteristics of the foods, we choose, we can determine their calorie density and make informed decisions.  Targeting Your Nutrition Priorities  Clinical staff conducted group or individual video education with verbal and written material and guidebook.  Patient learns that lifestyle habits have a tremendous impact on disease risk and progression. This video provides eating and physical activity recommendations based on your personal health goals, such as reducing LDL cholesterol, losing weight, preventing or controlling type 2 diabetes, and reducing high blood pressure.  Vitamins and Minerals  Clinical staff conducted group or individual video education with verbal and written material and guidebook.  Patient learns different ways to obtain key vitamins and minerals, including through a recommended healthy diet. It is important to discuss all supplements you take with your doctor.   Healthy Mind-Set    Smoking Cessation  Clinical staff conducted group or individual video education with verbal and written material and guidebook.   Patient learns that cigarette smoking and tobacco addiction pose a serious health risk which affects millions of people. Stopping smoking will significantly reduce the risk of heart disease, lung disease, and many forms of cancer. Recommended strategies for quitting are covered, including working with your doctor to develop a successful plan.  Culinary   Becoming a Set designer conducted group or individual video education with verbal and written material and guidebook.  Patient learns that cooking at home can be healthy, cost-effective, quick, and puts them in control. Keys to cooking healthy recipes will include looking at your recipe, assessing your equipment needs, planning ahead, making it simple, choosing cost-effective seasonal ingredients, and limiting the use of added fats, salts, and sugars.  Cooking - Breakfast and Snacks  Clinical staff conducted group or individual video education with verbal and written material and guidebook.  Patient learns how important breakfast is to satiety and nutrition through the entire day. Recommendations include key foods to eat during breakfast to help stabilize blood sugar levels and to prevent overeating at meals later in the day. Planning ahead is also a key component.  Cooking - Educational psychologist conducted group or individual video education with verbal and written material and guidebook.  Patient learns eating strategies to improve overall health, including an approach to cook more at home. Recommendations include thinking of animal protein as a side on your plate rather than center stage and focusing instead on lower calorie dense options like vegetables, fruits, whole grains, and plant-based proteins, such as beans. Making sauces in large quantities to freeze for later and leaving the skin on your vegetables are also recommended to maximize your experience.  Cooking - Healthy Salads and Dressing Clinical staff  conducted group or individual video education with verbal and written material and guidebook.  Patient learns that vegetables, fruits, whole grains, and legumes are the foundations of the Pritikin Eating Plan. Recommendations include how to incorporate each of these in flavorful and healthy salads, and how to create homemade  salad dressings. Proper handling of ingredients is also covered. Cooking - Soups and [employer] - Soups and Desserts Clinical staff conducted group or individual video education with verbal and written material and guidebook.  Patient learns that Pritikin soups and desserts make for easy, nutritious, and delicious snacks and meal components that are low in sodium, fat, sugar, and calorie density, while high in vitamins, minerals, and filling fiber. Recommendations include simple and healthy ideas for soups and desserts.   Overview     The Pritikin Solution Program Overview Clinical staff conducted group or individual video education with verbal and written material and guidebook.  Patient learns that the results of the Pritikin Program have been documented in more than 100 articles published in peer-reviewed journals, and the benefits include reducing risk factors for (and, in some cases, even reversing) high cholesterol, high blood pressure, type 2 diabetes, obesity, and more! An overview of the three key pillars of the Pritikin Program will be covered: eating well, doing regular exercise, and having a healthy mind-set.  WORKSHOPS  Exercise: Exercise Basics: Building Your Action Plan Clinical staff led group instruction and group discussion with PowerPoint presentation and patient guidebook. To enhance the learning environment the use of posters, models and videos may be added. At the conclusion of this workshop, patients will comprehend the difference between physical activity and exercise, as well as the benefits of incorporating both, into their routine. Patients will  understand the FITT (Frequency, Intensity, Time, and Type) principle and how to use it to build an exercise action plan. In addition, safety concerns and other considerations for exercise and cardiac rehab will be addressed by the presenter. The purpose of this lesson is to promote a comprehensive and effective weekly exercise routine in order to improve patients' overall level of fitness.   Managing Heart Disease: Your Path to a Healthier Heart Clinical staff led group instruction and group discussion with PowerPoint presentation and patient guidebook. To enhance the learning environment the use of posters, models and videos may be added.At the conclusion of this workshop, patients will understand the anatomy and physiology of the heart. Additionally, they will understand how Pritikin's three pillars impact the risk factors, the progression, and the management of heart disease.  The purpose of this lesson is to provide a high-level overview of the heart, heart disease, and how the Pritikin lifestyle positively impacts risk factors.  Exercise Biomechanics Clinical staff led group instruction and group discussion with PowerPoint presentation and patient guidebook. To enhance the learning environment the use of posters, models and videos may be added. Patients will learn how the structural parts of their bodies function and how these functions impact their daily activities, movement, and exercise. Patients will learn how to promote a neutral spine, learn how to manage pain, and identify ways to improve their physical movement in order to promote healthy living. The purpose of this lesson is to expose patients to common physical limitations that impact physical activity. Participants will learn practical ways to adapt and manage aches and pains, and to minimize their effect on regular exercise. Patients will learn how to maintain good posture while sitting, walking, and lifting.  Balance Training  and Fall Prevention  Clinical staff led group instruction and group discussion with PowerPoint presentation and patient guidebook. To enhance the learning environment the use of posters, models and videos may be added. At the conclusion of this workshop, patients will understand the importance of their sensorimotor skills (vision, proprioception, and the vestibular  system) in maintaining their ability to balance as they age. Patients will apply a variety of balancing exercises that are appropriate for their current level of function. Patients will understand the common causes for poor balance, possible solutions to these problems, and ways to modify their physical environment in order to minimize their fall risk. The purpose of this lesson is to teach patients about the importance of maintaining balance as they age and ways to minimize their risk of falling.  WORKSHOPS   Nutrition:  Fueling a Ship broker led group instruction and group discussion with PowerPoint presentation and patient guidebook. To enhance the learning environment the use of posters, models and videos may be added. Patients will review the foundational principles of the Pritikin Eating Plan and understand what constitutes a serving size in each of the food groups. Patients will also learn Pritikin-friendly foods that are better choices when away from home and review make-ahead meal and snack options. Calorie density will be reviewed and applied to three nutrition priorities: weight maintenance, weight loss, and weight gain. The purpose of this lesson is to reinforce (in a group setting) the key concepts around what patients are recommended to eat and how to apply these guidelines when away from home by planning and selecting Pritikin-friendly options. Patients will understand how calorie density may be adjusted for different weight management goals.  Mindful Eating  Clinical staff led group instruction and group  discussion with PowerPoint presentation and patient guidebook. To enhance the learning environment the use of posters, models and videos may be added. Patients will briefly review the concepts of the Pritikin Eating Plan and the importance of low-calorie dense foods. The concept of mindful eating will be introduced as well as the importance of paying attention to internal hunger signals. Triggers for non-hunger eating and techniques for dealing with triggers will be explored. The purpose of this lesson is to provide patients with the opportunity to review the basic principles of the Pritikin Eating Plan, discuss the value of eating mindfully and how to measure internal cues of hunger and fullness using the Hunger Scale. Patients will also discuss reasons for non-hunger eating and learn strategies to use for controlling emotional eating.  Targeting Your Nutrition Priorities Clinical staff led group instruction and group discussion with PowerPoint presentation and patient guidebook. To enhance the learning environment the use of posters, models and videos may be added. Patients will learn how to determine their genetic susceptibility to disease by reviewing their family history. Patients will gain insight into the importance of diet as part of an overall healthy lifestyle in mitigating the impact of genetics and other environmental insults. The purpose of this lesson is to provide patients with the opportunity to assess their personal nutrition priorities by looking at their family history, their own health history and current risk factors. Patients will also be able to discuss ways of prioritizing and modifying the Pritikin Eating Plan for their highest risk areas  Menu  Clinical staff led group instruction and group discussion with PowerPoint presentation and patient guidebook. To enhance the learning environment the use of posters, models and videos may be added. Using menus brought in from [employer],  or printed from [employer], patients will apply the Pritikin dining out guidelines that were presented in the [employer] video. Patients will also be able to practice these guidelines in a variety of provided scenarios. The purpose of this lesson is to provide patients with the opportunity to practice  hands-on learning of the [employer] guidelines with actual menus and practice scenarios.  Label Reading Clinical staff led group instruction and group discussion with PowerPoint presentation and patient guidebook. To enhance the learning environment the use of posters, models and videos may be added. Patients will review and discuss the Pritikin label reading guidelines presented in Pritikin's Label Reading Educational series video. Using fool labels brought in from local grocery stores and markets, patients will apply the label reading guidelines and determine if the packaged food meet the Pritikin guidelines. The purpose of this lesson is to provide patients with the opportunity to review, discuss, and practice hands-on learning of the Pritikin Label Reading guidelines with actual packaged food labels. Cooking School  Pritikin's [employer] are designed to teach patients ways to prepare quick, simple, and affordable recipes at home. The importance of nutrition's role in chronic disease risk reduction is reflected in its emphasis in the overall Pritikin program. By learning how to prepare essential core Pritikin Eating Plan recipes, patients will increase control over what they eat; be able to customize the flavor of foods without the use of added salt, sugar, or fat; and improve the quality of the food they consume. By learning a set of core recipes which are easily assembled, quickly prepared, and affordable, patients are more likely to prepare more healthy foods at home. These workshops focus on convenient breakfasts, simple entres, side dishes, and desserts which  can be prepared with minimal effort and are consistent with nutrition recommendations for cardiovascular risk reduction. Cooking [employer] are taught by a Armed forces logistics/support/administrative officer (RD) who has been trained by the [employer]. The chef or RD has a clear understanding of the importance of minimizing - if not completely eliminating - added fat, sugar, and sodium in recipes. Throughout the series of Cooking School Workshop sessions, patients will learn about healthy ingredients and efficient methods of cooking to build confidence in their capability to prepare    Cooking School weekly topics:  Adding Flavor- Sodium-Free  Fast and Healthy Breakfasts  Powerhouse Plant-Based Proteins  Satisfying Salads and Dressings  Simple Sides and Sauces  International Cuisine-Spotlight on the [employer] Zones  Delicious Desserts  Savory Soups  [employer] - Meals in a Astronomer Appetizers and Snacks  Comforting Weekend Breakfasts  One-Pot Wonders   Fast Evening Meals  Landscape architect Your Pritikin Plate  WORKSHOPS   Healthy Mindset (Psychosocial):  Focused Goals, Sustainable Changes Clinical staff led group instruction and group discussion with PowerPoint presentation and patient guidebook. To enhance the learning environment the use of posters, models and videos may be added. Patients will be able to apply effective goal setting strategies to establish at least one personal goal, and then take consistent, meaningful action toward that goal. They will learn to identify common barriers to achieving personal goals and develop strategies to overcome them. Patients will also gain an understanding of how our mind-set can impact our ability to achieve goals and the importance of cultivating a positive and growth-oriented mind-set. The purpose of this lesson is to provide patients with a deeper understanding of how to set and achieve personal goals, as well as the tools  and strategies needed to overcome common obstacles which may arise along the way.  From Head to Heart: The Power of a Healthy Outlook  Clinical staff led group instruction and group discussion with PowerPoint presentation and patient guidebook. To enhance the learning environment the use  of posters, models and videos may be added. Patients will be able to recognize and describe the impact of emotions and mood on physical health. They will discover the importance of self-care and explore self-care practices which may work for them. Patients will also learn how to utilize the 4 C's to cultivate a healthier outlook and better manage stress and challenges. The purpose of this lesson is to demonstrate to patients how a healthy outlook is an essential part of maintaining good health, especially as they continue their cardiac rehab journey.  Healthy Sleep for a Healthy Heart Clinical staff led group instruction and group discussion with PowerPoint presentation and patient guidebook. To enhance the learning environment the use of posters, models and videos may be added. At the conclusion of this workshop, patients will be able to demonstrate knowledge of the importance of sleep to overall health, well-being, and quality of life. They will understand the symptoms of, and treatments for, common sleep disorders. Patients will also be able to identify daytime and nighttime behaviors which impact sleep, and they will be able to apply these tools to help manage sleep-related challenges. The purpose of this lesson is to provide patients with a general overview of sleep and outline the importance of quality sleep. Patients will learn about a few of the most common sleep disorders. Patients will also be introduced to the concept of "sleep hygiene," and discover ways to self-manage certain sleeping problems through simple daily behavior changes. Finally, the workshop will motivate patients by clarifying the links between  quality sleep and their goals of heart-healthy living.   Recognizing and Reducing Stress Clinical staff led group instruction and group discussion with PowerPoint presentation and patient guidebook. To enhance the learning environment the use of posters, models and videos may be added. At the conclusion of this workshop, patients will be able to understand the types of stress reactions, differentiate between acute and chronic stress, and recognize the impact that chronic stress has on their health. They will also be able to apply different coping mechanisms, such as reframing negative self-talk. Patients will have the opportunity to practice a variety of stress management techniques, such as deep abdominal breathing, progressive muscle relaxation, and/or guided imagery.  The purpose of this lesson is to educate patients on the role of stress in their lives and to provide healthy techniques for coping with it.  Learning Barriers/Preferences:  Learning Barriers/Preferences - 08/03/23 1103       Learning Barriers/Preferences   Learning Barriers None    Learning Preferences Audio;Written Material;Pictoral             Education Topics:  Knowledge Questionnaire Score:  Knowledge Questionnaire Score - 08/03/23 1118       Knowledge Questionnaire Score   Pre Score 23/24             Core Components/Risk Factors/Patient Goals at Admission:  Personal Goals and Risk Factors at Admission - 08/03/23 1100       Core Components/Risk Factors/Patient Goals on Admission    Weight Management Yes;Obesity    Intervention Weight Management/Obesity: Establish reasonable short term and long term weight goals.;Obesity: Provide education and appropriate resources to help participant work on and attain dietary goals.    Admit Weight 227 lb 11.8 oz (103.3 kg)    Expected Outcomes Short Term: Continue to assess and modify interventions until short term weight is achieved;Long Term: Adherence to nutrition  and physical activity/exercise program aimed toward attainment of established weight goal;Weight Loss: Understanding of  general recommendations for a balanced deficit meal plan, which promotes 1-2 lb weight loss per week and includes a negative energy balance of [phone] kcal/d    Hypertension Yes    Intervention Provide education on lifestyle modifcations including regular physical activity/exercise, weight management, moderate sodium restriction and increased consumption of fresh fruit, vegetables, and low fat dairy, alcohol moderation, and smoking cessation.;Monitor prescription use compliance.    Expected Outcomes Short Term: Continued assessment and intervention until BP is < 140/44mm HG in hypertensive participants. < 130/51mm HG in hypertensive participants with diabetes, heart failure or chronic kidney disease.;Long Term: Maintenance of blood pressure at goal levels.    Lipids Yes    Intervention Provide education and support for participant on nutrition & aerobic/resistive exercise along with prescribed medications to achieve LDL 70mg , HDL >40mg .    Expected Outcomes Short Term: Participant states understanding of desired cholesterol values and is compliant with medications prescribed. Participant is following exercise prescription and nutrition guidelines.;Long Term: Cholesterol controlled with medications as prescribed, with individualized exercise RX and with personalized nutrition plan. Value goals: LDL < 70mg , HDL > 40 mg.    Stress Yes    Intervention Offer individual and/or small group education and counseling on adjustment to heart disease, stress management and health-related lifestyle change. Teach and support self-help strategies.    Expected Outcomes Short Term: Participant demonstrates changes in health-related behavior, relaxation and other stress management skills, ability to obtain effective social support, and compliance with psychotropic medications if prescribed.;Long Term:  Emotional wellbeing is indicated by absence of clinically significant psychosocial distress or social isolation.    Personal Goal Other Yes    Personal Goal Be as active as he was before. Be able to eat heart healthy foods that taste good or enjoy the foods he's used to eating. Be able to live a normal life without worrying or feeling down.    Intervention Develop an exercise action plan that he can follow to build strength and endurance, so that he can resume his previous activity level. Develop a nutrition action plan incorporating heart healthy foods that taste good that he can incorporate into his meal planning. Develop techniques he can use to help cope with stress.    Expected Outcomes Greely will be able to resume his previous activites. He will comply with exercise and nutrition recommendations to live a healthy lifestyle. He will be able to cope with stressors.             Core Components/Risk Factors/Patient Goals Review:    Core Components/Risk Factors/Patient Goals at Discharge (Final Review):    ITP Comments:  ITP Comments     Row Name 08/03/23 1020           ITP Comments Medical Director- Dr. Driscilla Moats, MD. Introduction to [facility]  / Intensive Cardiac Rehab. Reviewed initial orientation folder with participant.                Comments: Trence attended orientation for the cardiac rehabilitation program on  08/03/2023  to perform initial intake and exercise walk test. He was introduced to the [employer] education and orientation packet was reviewed. Completed 6-minute walk test, measurements, initial ITP, and exercise prescription. Vital signs stable. Telemetry-normal sinus rhythm, asymptomatic.   Service time was from 1020 to 1216.  Artist Pais, MS, ACSM CEP 08/03/2023 1329

## 2023-08-03 NOTE — Progress Notes (Signed)
Cardiac Rehab Medication Review   Does the patient  feel that his/her medications are working for him/her?  yes  Has the patient been experiencing any side effects to the medications prescribed?  no  Does the patient measure his/her own blood pressure or blood glucose at home?  yes   Does the patient have any problems obtaining medications due to transportation or finances?   no  Understanding of regimen: excellent Understanding of indications: excellent Potential of compliance: excellent    Comments: Mr. Ravenscroft checks his blood pressure daily and is compliant with his medication regimen.    Cristy Hilts 08/03/2023 10:35 AM

## 2023-08-04 ENCOUNTER — Ambulatory Visit: Payer: Medicaid Other | Attending: Thoracic Surgery (Cardiothoracic Vascular Surgery)

## 2023-08-04 DIAGNOSIS — I480 Paroxysmal atrial fibrillation: Secondary | ICD-10-CM

## 2023-08-04 DIAGNOSIS — Z952 Presence of prosthetic heart valve: Secondary | ICD-10-CM | POA: Diagnosis not present

## 2023-08-04 LAB — POCT INR: INR: 2.8 (ref 2.0–3.0)

## 2023-08-04 NOTE — Patient Instructions (Signed)
Description   Continue taking warfarin 2 TABLETS DAILY EXCEPT 3 TABLETS ON SATURDAYS.   Recheck INR in 2 weeks. Stay consistent with greens each week-2 to 3 times per week.  [facility] [phone] Amio stopped on 07/22/23.

## 2023-08-05 ENCOUNTER — Emergency Department (HOSPITAL_COMMUNITY): Payer: Medicaid Other

## 2023-08-05 ENCOUNTER — Encounter (HOSPITAL_COMMUNITY): Payer: Self-pay

## 2023-08-05 ENCOUNTER — Telehealth: Payer: Self-pay

## 2023-08-05 ENCOUNTER — Emergency Department (HOSPITAL_COMMUNITY)
Admission: EM | Admit: 2023-08-05 | Discharge: 2023-08-05 | Disposition: A | Discharge status: Home / Self Care | Payer: Medicaid Other | Attending: Emergency Medicine | Admitting: Emergency Medicine

## 2023-08-05 ENCOUNTER — Other Ambulatory Visit: Payer: Self-pay

## 2023-08-05 ENCOUNTER — Telehealth: Payer: Self-pay | Admitting: *Deleted

## 2023-08-05 DIAGNOSIS — Z7982 Long term (current) use of aspirin: Secondary | ICD-10-CM | POA: Insufficient documentation

## 2023-08-05 DIAGNOSIS — R079 Chest pain, unspecified: Secondary | ICD-10-CM | POA: Diagnosis not present

## 2023-08-05 DIAGNOSIS — R0789 Other chest pain: Secondary | ICD-10-CM | POA: Diagnosis not present

## 2023-08-05 LAB — CBC
HCT: 38.4 % — ABNORMAL LOW (ref 39.0–52.0)
Hemoglobin: 12.2 g/dL — ABNORMAL LOW (ref 13.0–17.0)
MCH: 29.7 pg (ref 26.0–34.0)
MCHC: 31.8 g/dL (ref 30.0–36.0)
MCV: 93.4 fL (ref 80.0–100.0)
Platelets: 328 10*3/uL (ref 150–400)
RBC: 4.11 MIL/uL — ABNORMAL LOW (ref 4.22–5.81)
RDW: 14.2 % (ref 11.5–15.5)
WBC: 4.2 10*3/uL (ref 4.0–10.5)
nRBC: 0 % (ref 0.0–0.2)

## 2023-08-05 LAB — TROPONIN I (HIGH SENSITIVITY)
Troponin I (High Sensitivity): 7 ng/L (ref <18)
Troponin I (High Sensitivity): 6 ng/L (ref <18)

## 2023-08-05 LAB — BASIC METABOLIC PANEL
Anion gap: 10 (ref 5–15)
BUN: 20 mg/dL (ref 6–20)
CO2: 24 mmol/L (ref 22–32)
Calcium: 9 mg/dL (ref 8.9–10.3)
Chloride: 103 mmol/L (ref 98–111)
Creatinine, Ser: 1.37 mg/dL — ABNORMAL HIGH (ref 0.61–1.24)
GFR, Estimated: >60 mL/min (ref >60)
Glucose, Bld: 88 mg/dL (ref 70–99)
Potassium: 3.9 mmol/L (ref 3.5–5.1)
Sodium: 137 mmol/L (ref 135–145)

## 2023-08-05 NOTE — Telephone Encounter (Signed)
Call from patient states is having chest pain that goes down his arm.  Had recent Cardio Thorasic surgery.  Patient was advised to call the surgeon or go to the ER for Assessment of his pain  No available appointments in the Clinics.  Patient stated did not want to wait in the ER for a long time.  Patient was again advised to either go to the ER or call the Cardiac Surgeon.   Patient was given the number to contact the Cardiac Surgeon if he chooses that option.

## 2023-08-05 NOTE — Discharge Instructions (Signed)
Your workup for chest pain all resulted normal. This is good news. Most likely your pain is related to a muscle strain or your neck. Please follow up with your outpatient provider within 1-2 weeks.

## 2023-08-05 NOTE — Telephone Encounter (Signed)
Patient contacted the office concerned about pain he has been experiencing for 2 days. He states that his left "arm pit" has been aching that radiates down along with mild chest pain. He states that his arm feels better when it is bent unlike when he has it extended out and down. He says that he was not active when this started. He states that the chest pain comes and goes and does not seem to matter if he is active or still. He states that he has seen his Cardiology office and mention this to state may be nerve issues. He states that he has also had 2 EKGs recently that were unremarkable. He also contacted his PCP whom did not have an appointment available and he was told to contact [facility], Dr. Lucilla Lame office or go to the ED.   Advised patient that he may be best served in the ED due to pain he is experiencing. He acknowledged receipt.

## 2023-08-05 NOTE — ED Provider Notes (Signed)
[place] EMERGENCY DEPARTMENT AT [facility] Provider Note   CSN: [id number] Arrival date & time: 08/05/23  1450     History  Chief Complaint  Patient presents with   Chest Pain    Timothy Browning is a 52 y.o. male.  52 y.o. male presenting with subacute chest pain that radiates down the left arm which started on Tuesday 10/15.  Patient reports he was at cardiac rehab when shortly after he began having pain in his left arm.  He describes it as tingling and radiating to his chest.  He reports the pain has improved since onset.  He denies worsening chest pain with deep inspiration, exertion.  He is currently anticoagulated for mechanical aortic valve with warfarin and most recently had a therapeutic INR 10/16.  He has a history of aortic aneurysm repair.  He was most recently hospitalized on 09/24 for an acute diverticular bleed which he continues to have residual lightheadedness and dizziness when he stands up. This has been improving over time and did not acutely worsen with the new left arm pain. Denies recent illness, fever, chills, sick contacts. He has not had any unilateral leg swelling, denies long car or air travel, no prior history of clots, and currently anticoagulated.   The history is provided by the patient. No language interpreter was used.  Chest Pain Associated symptoms: no diaphoresis, no dizziness, no fatigue, no fever, no headache, no nausea, no shortness of breath, no vomiting and no weakness   Risk factors: aortic disease        Home Medications Prior to Admission medications   Medication Sig Start Date End Date Taking? Authorizing Provider  aspirin EC 81 MG tablet Take 1 tablet (81 mg total) by mouth daily. Swallow whole. 05/14/23   Leary Roca, PA-C  cyclobenzaprine (FLEXERIL) 5 MG tablet Take 5 mg by mouth 3 (three) times daily as needed for muscle spasms.    [provider]  metoprolol succinate (TOPROL-XL) 25 MG 24 hr tablet Take  0.5 tablets (12.5 mg total) by mouth daily with breakfast. 07/16/23 01/12/24  Marrianne Mood, MD  rosuvastatin (CRESTOR) 5 MG tablet Take 1 tablet (5 mg total) by mouth daily. Patient taking differently: Take 5 mg by mouth at bedtime. 09/23/22   Masters, Katie, DO  sildenafil (VIAGRA) 25 MG tablet Take 100 mg by mouth daily as needed for erectile dysfunction.    [provider]  warfarin (COUMADIN) 2.5 MG tablet Take 2 tablets (5 mg total) by mouth daily. Or as directed by the Coumadin Clinic 05/14/23 05/13/24  Leary Roca, PA-C      Allergies    Ibuprofen    Review of Systems   Review of Systems  Constitutional:  Negative for activity change, appetite change, diaphoresis, fatigue and fever.  Respiratory:  Positive for chest tightness. Negative for apnea and shortness of breath.   Cardiovascular:  Positive for chest pain.  Gastrointestinal:  Negative for nausea and vomiting.  Musculoskeletal:  Negative for joint swelling, myalgias, neck pain and neck stiffness.  Neurological:  Negative for dizziness, syncope, weakness and headaches.    Physical Exam Updated Vital Signs BP 131/83   Pulse 67   Temp 98.1 F (36.7 C) (Oral)   Resp 14   SpO2 100%  Physical Exam Constitutional:      General: He is not in acute distress.    Appearance: He is well-developed and normal weight. He is not ill-appearing or diaphoretic.  Cardiovascular:  Rate and Rhythm: Normal rate and regular rhythm.     Heart sounds: Murmur heard.     Systolic murmur is present with a grade of 6/6.  Pulmonary:     Effort: Pulmonary effort is normal. No tachypnea.     Breath sounds: Normal breath sounds.  Chest:     Chest wall: No mass.  Abdominal:     General: Bowel sounds are normal.     Palpations: Abdomen is soft.  Musculoskeletal:     Cervical back: Normal range of motion and neck supple.     Right lower leg: No tenderness. No edema.     Left lower leg: No tenderness. No edema.  Skin:     Capillary Refill: Capillary refill takes less than 2 seconds.  Neurological:     General: No focal deficit present.     Mental Status: He is alert and oriented to person, place, and time.     Cranial Nerves: No cranial nerve deficit.     Motor: No weakness.  Psychiatric:        Mood and Affect: Mood normal.        Behavior: Behavior normal.     ED Results / Procedures / Treatments   Labs (all labs ordered are listed, but only abnormal results are displayed) Labs Reviewed  BASIC METABOLIC PANEL - Abnormal; Notable for the following components:      Result Value   Creatinine, Ser 1.37 (*)    All other components within normal limits  CBC - Abnormal; Notable for the following components:   RBC 4.11 (*)    Hemoglobin 12.2 (*)    HCT 38.4 (*)    All other components within normal limits  TROPONIN I (HIGH SENSITIVITY)  TROPONIN I (HIGH SENSITIVITY)    EKG EKG Interpretation Date/Time:  Thursday August 05 2023 14:58:22 EDT Ventricular Rate:  75 PR Interval:  178 QRS Duration:  96 QT Interval:  395 QTC Calculation: 442 R Axis:   -6  Text Interpretation: Sinus rhythm Probable left atrial enlargement ST elev, probable normal early repol pattern Confirmed by Glyn Ade [phone]) on 08/05/2023 3:08:47 PM  Radiology DG Chest 2 View  Result Date: 08/05/2023 CLINICAL DATA:  Chest pain EXAM: CHEST - 2 VIEW COMPARISON:  06/25/2023 FINDINGS: Stable cardiomediastinal silhouette. Sternotomy. AVR. No focal consolidation, pleural effusion, or pneumothorax. No displaced rib fractures. IMPRESSION: No active cardiopulmonary disease. Electronically Signed   By: Minerva Fester M.D.   On: 08/05/2023 16:40    Procedures Procedures    Medications Ordered in ED Medications - No data to display  ED Course/ Medical Decision Making/ A&P Clinical Course as of 08/05/23 1901  Thu Aug 05, 2023  1542 Stable  (Resident EM) Right arm pain to chest since Tuesday. Present since last  appointment at cardiac rehab.  Recent diverticular bleed admission.    [CC]    Clinical Course User Index [CC] Glyn Ade, MD                                 Medical Decision Making  52 y.o. male presenting with left arm pain radiating to his chest. On presentation vital signs significant for elevated BP to 135/99. On exam he is well appearing in NAD. Differential for his presentation includes ACS, mechanical valve or aneurysm repair malfunction, PE, anemia, MSK chest wall pain, thoracic outlet syndrome, cervical stenosis/DDD, infectious pneumonia. He is stable on RA without  fever making infectious etiology unlikely, will confirm with CBC and CXR. EKG obtained and on independent interpretation showed mild ST elevations which has changed from prior studies. With his cardiac history and EKG changes, his presentation is most concerning for a cardiac etiology. Will trend troponin and obtain CXR. If troponin is elevated can consider ordering CTA chest to view aortic aneurysm repair.   On reassessment, patient reports his pain is still improving. His troponin was trended x2 and was normal. With normal cardiac enzymes and improving pain this is most likely MSK related. Patient to follow up with PCP outpatient for further workup of arm pain.   Amount and/or Complexity of Data Reviewed Labs: ordered. Decision-making details documented in ED Course. Radiology: ordered. Decision-making details documented in ED Course. ECG/medicine tests: ordered and independent interpretation performed. Decision-making details documented in ED Course.          Final Clinical Impression(s) / ED Diagnoses Final diagnoses:  Chest wall pain    Rx / DC Orders ED Discharge Orders     None         Glendale Chard, DO 08/05/23 1901    Glyn Ade, MD 08/05/23 2349

## 2023-08-05 NOTE — ED Triage Notes (Signed)
Patient reports left sided chest pain x 2 days. Patient had a aortic valve replaced in July. Called cardiology and told to come here.

## 2023-08-06 ENCOUNTER — Telehealth (HOSPITAL_COMMUNITY): Payer: Self-pay | Admitting: *Deleted

## 2023-08-06 NOTE — Telephone Encounter (Signed)
Spoke with the patient. Asked to hold on starting exercise until cleared by Dr Servando Salina. Patient states understanding.Thayer Headings RN BSN

## 2023-08-06 NOTE — Telephone Encounter (Signed)
-----   Message from Nurse Eileen Stanford E sent at 08/06/2023  4:10 PM EDT ----- From Dr. Servando Salina:  Please reschedule until I can review his ED visit , I received the information but I have not has the opportunity to review to help with decision making ----- Message ----- From: Cammy Copa, RN Sent: 08/06/2023   8:34 AM EDT To: Lindell Spar, RN; Thomasene Ripple, DO  Good morning Dr Servando Salina, Mr Timothy Browning presented to the ED on 08/05/23 with chest wall pain and was discharged. Is Mr Ingrum okay to proceed with exercise at cardiac rehab on 08/09/23?  I appreciate your input!  Sincerely, Gladstone Lighter RN Cardiac Rehab

## 2023-08-09 ENCOUNTER — Encounter (HOSPITAL_COMMUNITY): Payer: Medicaid Other

## 2023-08-09 NOTE — Plan of Care (Signed)
[facility] Tonsillectomy/Adenoidectomy, Postoperative PEDS care plan entered in error.

## 2023-08-10 ENCOUNTER — Telehealth (HOSPITAL_COMMUNITY): Payer: Self-pay | Admitting: *Deleted

## 2023-08-10 NOTE — Telephone Encounter (Signed)
Spoke with Mr Timothy Browning. Mr Cypret has been cleared to start exercise per Dr Servando Salina.   Patient aware and plans to attend exercise on 08/11/23. Gladstone Lighter, RN,BSN 08/10/2023 8:56 AM

## 2023-08-11 ENCOUNTER — Encounter (HOSPITAL_COMMUNITY)
Admission: RE | Admit: 2023-08-11 | Discharge: 2023-08-11 | Disposition: A | Discharge status: Home / Self Care | Payer: Medicaid Other | Source: Ambulatory Visit | Attending: Cardiology

## 2023-08-11 ENCOUNTER — Encounter: Payer: Self-pay | Admitting: Internal Medicine

## 2023-08-11 ENCOUNTER — Ambulatory Visit: Payer: Medicaid Other | Admitting: Internal Medicine

## 2023-08-11 VITALS — BP 123/73 | HR 70 | Temp 98.5°F | Wt 229.0 lb

## 2023-08-11 DIAGNOSIS — M79602 Pain in left arm: Secondary | ICD-10-CM | POA: Diagnosis not present

## 2023-08-11 DIAGNOSIS — Z9889 Other specified postprocedural states: Secondary | ICD-10-CM

## 2023-08-11 DIAGNOSIS — Z952 Presence of prosthetic heart valve: Secondary | ICD-10-CM

## 2023-08-11 DIAGNOSIS — R310 Gross hematuria: Secondary | ICD-10-CM | POA: Diagnosis not present

## 2023-08-11 NOTE — Progress Notes (Addendum)
Subjective:  CC: left arm pain  HPI:  [name] is a 52 y.o. male with a past medical history stated below and presents today for left arm pain.  In July he underwent Bentall procedure for aortic aneurysm now with mechanical aortic valve. He had post operative atrial fibrillation and was started on amiodarone. He ws cleared to start cardiac rehab by Dr. Cliffton Asters. He went to cardiac rehab for the first time this morning. Last Thursday (10/17), he had sudden on set of pain in his left armpit that radiated into his hand. Pain was a numb/ tingling type of pain. He called cardiology office and was told to go to ED. In emergency room, EKG showed was reassuring, he did have some J-point elevation in V3. Troponin was normal. His pain resolved in ED and he was discharged. Since then he has had a milder version of symptoms in his hand when he first wakes up in the morning but this resolves on own.  He was also admitted in September in setting of diverticular bleed and he underwent embolization with IR during that admission.   Please see problem based assessment and plan for additional details.  Past Medical History:  Diagnosis Date   Aortic aneurysm without rupture (HCC)    Chest pain of uncertain etiology 08/11/2021   CKD (chronic kidney disease)    GERD (gastroesophageal reflux disease)    Heart failure (HCC)    Hypertension    Obesity (BMI 30-39.9) 08/11/2021   Sleep apnea     Current Outpatient Medications on File Prior to Visit  Medication Sig Dispense Refill   aspirin EC 81 MG tablet Take 1 tablet (81 mg total) by mouth daily. Swallow whole. 30 tablet 12   cyclobenzaprine (FLEXERIL) 5 MG tablet Take 5 mg by mouth 3 (three) times daily as needed for muscle spasms.     metoprolol succinate (TOPROL-XL) 25 MG 24 hr tablet Take 0.5 tablets (12.5 mg total) by mouth daily with breakfast. 30 tablet 2   rosuvastatin (CRESTOR) 5 MG tablet Take 1 tablet (5 mg total) by mouth daily.  (Patient taking differently: Take 5 mg by mouth at bedtime.) 90 tablet 3   sildenafil (VIAGRA) 25 MG tablet Take 100 mg by mouth daily as needed for erectile dysfunction.     warfarin (COUMADIN) 2.5 MG tablet Take 2 tablets (5 mg total) by mouth daily. Or as directed by the Coumadin Clinic 60 tablet 11   No current facility-administered medications on file prior to visit.    Family History  Problem Relation Age of Onset   Cancer Sister    Hearing loss Maternal Grandmother    Cancer Maternal Grandfather     Social History   Socioeconomic History   Marital status: Single    Spouse name: n/a   Number of children: 3   Years of education: Not on file   Highest education level: Not on file  Occupational History   Occupation: Education administrator    Comment: unemployed  Tobacco Use   Smoking status: Never   Smokeless tobacco: Never  Vaping Use   Vaping status: Never Used  Substance and Sexual Activity   Alcohol use: Yes    Comment: weekends, 1-2 drinks   Drug use: No   Sexual activity: Not on file  Other Topics Concern   Not on file  Social History Narrative   Not on file   Social Determinants of Health   Financial Resource Strain: Not on file  Food Insecurity: No Food Insecurity (07/05/2023)   Hunger Vital Sign    Worried About Running Out of Food in the Last Year: Never true    Ran Out of Food in the Last Year: Never true  Transportation Needs: No Transportation Needs (07/05/2023)   PRAPARE - Administrator, Civil Service (Medical): No    Lack of Transportation (Non-Medical): No  Physical Activity: Not on file  Stress: Not on file  Social Connections: Unknown (03/19/2023)   Received from [facility]   Social Network    Social Network: Not on file  Intimate Partner Violence: Not At Risk (07/05/2023)   Humiliation, Afraid, Rape, and Kick questionnaire    Fear of Current or Ex-Partner: No    Emotionally Abused: No    Physically Abused: No    Sexually  Abused: No    Review of Systems: ROS negative except for what is noted on the assessment and plan.  Objective:   Vitals:   08/11/23 1319 08/11/23 1347  BP: (!) 140/79 123/73  Pulse: 72 70  Temp: 98.5 F (36.9 C)   TempSrc: Oral   SpO2: 100%   Weight: 229 lb (103.9 kg)     Physical Exam: Constitutional: well-appearing  Cardiovascular: regular rate and rhythm, prominent S2 with clicking Pulmonary/Chest: normal work of breathing on room air, lungs clear to auscultation bilaterally MSK: full passive ROM of left shoulder, no pain or symptom reproducible with palpation of left axilla, no prominent lymphadenopathy, grip strength is 5/5 to bilateral hands Neurological: alert & oriented x 3  Assessment & Plan:  Hematuria Patient presents for follow-up on hematuria.  First episode of this was in August.  He had brown urine and went to the emergency room.  At that time dipstick showed large hgb. He does not smoke and no family history of bladder cancer. In the past he followed with urology for BPH and last PSA was 3.7 in 4/24. P: Repeat UA without hgb He will follow-up with [facility] for BPH  Arm pain, left Patient presents with episodes of left arm pain. Last week he reports he had sudden onset pain that started in left armpit and traveled into left fingers. He went to ED after talking with cardiology office. ED showed reassuring EKG and troponins. Since last week, he has had milder symptoms that are intermittent. He has pain from armpit into finger but numbness/ tingling is not as severe. He went to cardiac rehab today and was able to complete exercises without symptoms. A:  These episodes do not sound like a typical anginal pattern.  His left heart cath in 9/23 showed normal coronary arteries. 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 I was not able to reproduce symptoms with motions of arm or compression of axilla. P: I will reach out  to Dr. Servando Salina to see her thoughts on events or if patient needs to follow-up with cardiology or CT surgery  Addendum 10/25: Dr. Servando Salina agrees that episode sounds concerning for nerve pain, she is not worried about cardiac etiology.    Patient discussed with Dr. Marjorie Smolder [place] Spees, D.O. [facility]  PGY-3 Pager: [phone]  Phone: [phone] Date 08/13/2023  Time 3:42 PM

## 2023-08-11 NOTE — Progress Notes (Signed)
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 Swallow whole., Disp: 30 tablet, Rfl: 12   cyclobenzaprine (FLEXERIL) 5 MG tablet, Take 5 mg by mouth 3 (three) times daily as needed for muscle spasms., Disp: , Rfl:    metoprolol succinate (TOPROL-XL) 25 MG 24 hr tablet, Take 0.5 tablets (12.5 mg total) by mouth daily with breakfast., Disp: 30 tablet, Rfl: 2   rosuvastatin (CRESTOR) 5 MG tablet, Take 1 tablet (5 mg total) by mouth daily. (Patient taking differently: Take 5 mg by mouth at bedtime.), Disp: 90 tablet, Rfl: 3   sildenafil (VIAGRA) 25 MG tablet, Take 100 mg by mouth daily as needed for erectile dysfunction., Disp: , Rfl:    warfarin (COUMADIN) 2.5 MG tablet, Take 2 tablets (5 mg total) by mouth daily. Or as directed by the [facility], Disp: 60 tablet, Rfl: 11  Past Medical History: Past Medical History:  Diagnosis Date   Aortic aneurysm without rupture (HCC)    Chest pain of uncertain etiology 08/11/2021   CKD (chronic kidney disease)    GERD (gastroesophageal reflux disease)    Heart failure (HCC)    Hypertension    Obesity (BMI 30-39.9) 08/11/2021   Sleep apnea     Tobacco Use: Social History   Tobacco Use  Smoking Status Never  Smokeless Tobacco Never     Labs: Review Flowsheet  More data exists      Latest Ref Rng & Units 07/14/2022 05/04/2023 05/06/2023 05/07/2023 06/02/2023  Labs for ITP Cardiac and Pulmonary Rehab  Cholestrol 100 - 199 mg/dL 440  - - - 102   LDL (calc) 0 - 99 mg/dL 725  - - - 59   HDL-C >36 mg/dL 57  - - - 61   Trlycerides 0 - 149 mg/dL 70  - - - 62   Hemoglobin A1c 4.8 - 5.6 % 5.2  5.4  - - -  PH, Arterial 7.35 - 7.45 - 7.4  7.328  7.299  7.330  7.339  7.377  7.366  7.391  7.329  7.333  -  PCO2 arterial 32 - 48 mmHg - 38  43.1  43.9  41.5  38.0  38.7  38.4  40.8  42.9  45.0  -  Bicarbonate 20.0 - 28.0 mmol/L - 23.5  22.4  21.3  21.8  20.8  22.8  22.0  24.8  22.6  23.7  -  TCO2 22 - 32 mmol/L - - 24  23  23  22  24  24  23  27  26  27  25  24  25   -  Acid-base deficit 0.0 - 2.0 mmol/L - 1.1  3.0  5.0  4.0  5.0  2.0  3.0  3.0  2.0  -  O2 Saturation % - 97.4  92  92  97  95  99  100  100  100  96  -    Details       Multiple values from one day are sorted in reverse-chronological order         Capillary Blood Glucose: Lab Results  Component Value Date   GLUCAP 126 (H) 07/04/2023   GLUCAP 88 05/12/2023   GLUCAP 133 (H) 05/11/2023   GLUCAP 113 (H) 05/11/2023   GLUCAP 114 (H) 05/10/2023     Exercise Target Goals: Exercise Program Goal: Individual exercise prescription set using results from initial 6 min walk test and THRR while considering  patient's activity barriers and safety.   Exercise Prescription Goal: Initial exercise prescription builds to 30-45 minutes a day of aerobic activity, 2-3 days per week.  Home exercise guidelines will be given to patient during program as part of exercise prescription that the participant will acknowledge.  Activity Barriers & Risk Stratification:  Activity Barriers & Cardiac Risk Stratification - 08/03/23 1040       Activity Barriers & Cardiac Risk Stratification   Activity Barriers Back Problems;Other (comment);Balance Concerns    Comments Right foot drop  (wears foot brace), chronic back pain, bilateral knee pain (needs knee replacement).    Cardiac Risk Stratification Low             6 Minute Walk:  6 Minute Walk     Row Name 08/03/23 1143         6 Minute Walk   Phase Initial     Distance 1161 feet     Walk Time 6 minutes     # of Rest Breaks 0     MPH 2.2     METS 3.42     RPE 9     Perceived Dyspnea  1     VO2 Peak 11.97     Symptoms Yes (comment)     Comments Mild shortness of breath. Back pain, chronic with walking, 3/10 on pain scale.     Resting HR 66 bpm     Resting BP 128/80     Resting Oxygen Saturation  100 %     Exercise Oxygen Saturation  during 6 min walk 99 %     Max Ex. HR 86 bpm     Max Ex. BP 132/76     2 Minute Post BP 110/80              Oxygen Initial Assessment:   Oxygen Re-Evaluation:   Oxygen Discharge (Final Oxygen Re-Evaluation):   Initial Exercise Prescription:  Initial Exercise Prescription - 08/03/23 1300       Date of Initial Exercise RX and Referring Provider   Date 08/03/23    Referring Provider Thomasene Ripple, DO    Expected Discharge Date 10/27/23      Recumbant Bike   Level 1    Watts 45    Minutes 15    METs 3.4      T5 Nustep   Level 2    SPM 85    Minutes 15    METs 3      Prescription Details   Frequency (times per week) 3    Duration Progress to 30 minutes of continuous aerobic without signs/symptoms of physical distress      Intensity   THRR 40-80% of Max Heartrate 68-135    Ratings of Perceived Exertion 11-13    Perceived Dyspnea 0-4      Progression  Progression Continue to progress workloads to maintain intensity without signs/symptoms of physical distress.      Resistance Training   Training Prescription Yes    Weight 5 lbs    Reps 10-15             Perform Capillary Blood Glucose checks as needed.  Exercise Prescription Changes:   Exercise Prescription Changes     Row Name 08/11/23 1030             Response to  Exercise   Blood Pressure (Admit) 118/70       Blood Pressure (Exercise) 126/78       Blood Pressure (Exit) 118/72       Heart Rate (Admit) 76 bpm       Heart Rate (Exercise) 95 bpm       Heart Rate (Exit) 87 bpm       Rating of Perceived Exertion (Exercise) 11       Symptoms None       Comments Off to a good start with exercise.       Duration Continue with 30 min of aerobic exercise without signs/symptoms of physical distress.       Intensity THRR unchanged         Progression   Progression Continue to progress workloads to maintain intensity without signs/symptoms of physical distress.       Average METs 2.2         Resistance Training   Training Prescription No  Relaxation day, no weights.         Interval Training   Interval Training No         Recumbant Bike   Level 2       RPM 65       Watts 20       Minutes 15       METs 2.2         T5 Nustep   Level 2       SPM 97       Minutes 15       METs 2.3                Exercise Comments:   Exercise Comments     Row Name 08/11/23 1121           Exercise Comments Arrin tolerated low intensity exercise well without symptoms. Increased workload on the recumbent bike from level 1 to 2. Oriented to stretching and exercise routine.                Exercise Goals and Review:   Exercise Goals     Row Name 08/03/23 1040             Exercise Goals   Increase Physical Activity Yes       Intervention Provide advice, education, support and counseling about physical activity/exercise needs.;Develop an individualized exercise prescription for aerobic and resistive training based on initial evaluation findings, risk stratification, comorbidities and participant's personal goals.       Expected Outcomes Short Term: Attend rehab on a regular basis to increase amount of physical activity.;Long Term: Add in home exercise to make exercise part of routine and to increase amount of physical activity.;Long Term:  Exercising regularly at least 3-5 days a week.       Increase Strength and Stamina Yes       Intervention Provide advice, education, support and counseling about physical activity/exercise needs.;Develop an individualized exercise prescription for aerobic  and resistive training based on initial evaluation findings, risk stratification, comorbidities and participant's personal goals.       Expected Outcomes Short Term: Increase workloads from initial exercise prescription for resistance, speed, and METs.;Short Term: Perform resistance training exercises routinely during rehab and add in resistance training at home;Long Term: Improve cardiorespiratory fitness, muscular endurance and strength as measured by increased METs and functional capacity ( )       Able to understand and use rate of perceived exertion (RPE) scale Yes       Intervention Provide education and explanation on how to use RPE scale       Expected Outcomes Short Term: Able to use RPE daily in rehab to express subjective intensity level;Long Term:  Able to use RPE to guide intensity level when exercising independently       Knowledge and understanding of Target Heart Rate Range (THRR) Yes       Intervention Provide education and explanation of THRR including how the numbers were predicted and where they are located for reference       Expected Outcomes Short Term: Able to state/look up THRR;Long Term: Able to use THRR to govern intensity when exercising independently;Short Term: Able to use daily as guideline for intensity in rehab       Able to check pulse independently Yes       Intervention Provide education and demonstration on how to check pulse in carotid and radial arteries.;Review the importance of being able to check your own pulse for safety during independent exercise       Expected Outcomes Short Term: Able to explain why pulse checking is important during independent exercise;Long Term: Able to check pulse independently and  accurately       Understanding of Exercise Prescription Yes       Intervention Provide education, explanation, and written materials on patient's individual exercise prescription       Expected Outcomes Short Term: Able to explain program exercise prescription;Long Term: Able to explain home exercise prescription to exercise independently                Exercise Goals Re-Evaluation :  Exercise Goals Re-Evaluation     Row Name 08/11/23 1121             Exercise Goal Re-Evaluation   Exercise Goals Review Increase Physical Activity;Increase Strength and Stamina;Able to understand and use rate of perceived exertion (RPE) scale;Knowledge and understanding of Target Heart Rate Range (THRR)       Comments Demarques was able to understand and use RPE scale appropriately. Reviewed MET scale and THRR with him.       Expected Outcomes Progress workloads as tolerated to help increase cardiorespiratory fitness.                Discharge Exercise Prescription (Final Exercise Prescription Changes):  Exercise Prescription Changes - 08/11/23 1030       Response to Exercise   Blood Pressure (Admit) 118/70    Blood Pressure (Exercise) 126/78    Blood Pressure (Exit) 118/72    Heart Rate (Admit) 76 bpm    Heart Rate (Exercise) 95 bpm    Heart Rate (Exit) 87 bpm    Rating of Perceived Exertion (Exercise) 11    Symptoms None    Comments Off to a good start with exercise.    Duration Continue with 30 min of aerobic exercise without signs/symptoms of physical distress.    Intensity THRR unchanged  Progression   Progression Continue to progress workloads to maintain intensity without signs/symptoms of physical distress.    Average METs 2.2      Resistance Training   Training Prescription No   Relaxation day, no weights.     Interval Training   Interval Training No      Recumbant Bike   Level 2    RPM 65    Watts 20    Minutes 15    METs 2.2      T5 Nustep   Level 2    SPM 97     Minutes 15    METs 2.3             Nutrition:  Target Goals: Understanding of nutrition guidelines, daily intake of sodium 1500mg , cholesterol 200mg , calories 30% from fat and 7% or less from saturated fats, daily to have 5 or more servings of fruits and vegetables.  Biometrics:  Pre Biometrics - 08/03/23 1020       Pre Biometrics   Waist Circumference 40.25 inches    Hip Circumference 43.5 inches    Waist to Hip Ratio 0.93 %    Triceps Skinfold 13 mm    % Body Fat 27.2 %    Grip Strength 54 kg    Flexibility 0 in    Single Leg Stand 24.37 seconds              Nutrition Therapy Plan and Nutrition Goals:   Nutrition Assessments:  MEDIFICTS Score Key: >=70 Need to make dietary changes  40-70 Heart Healthy Diet <= 40 Therapeutic Level Cholesterol Diet    Picture Your Plate Scores: <61 Unhealthy dietary pattern with much room for improvement. 41-50 Dietary pattern unlikely to meet recommendations for good health and room for improvement. 51-60 More healthful dietary pattern, with some room for improvement.  >60 Healthy dietary pattern, although there may be some specific behaviors that could be improved.    Nutrition Goals Re-Evaluation:   Nutrition Goals Re-Evaluation:   Nutrition Goals Discharge (Final Nutrition Goals Re-Evaluation):   Psychosocial: Target Goals: Acknowledge presence or absence of significant depression and/or stress, maximize coping skills, provide positive support system. Participant is able to verbalize types and ability to use techniques and skills needed for reducing stress and depression.  Initial Review & Psychosocial Screening:  Initial Psych Review & Screening - 08/03/23 1101       Initial Review   Current issues with Current Stress Concerns    Source of Stress Concerns Unable to participate in former interests or hobbies    Comments Patient started therapy with a counselor at [facility] on 07/29/23.      Family  Dynamics   Good Support System? Yes    Comments Patient doesn't talk to friends or family when he's stressed but recently started talking to a counselor.      Barriers   Psychosocial barriers to participate in program The patient should benefit from training in stress management and relaxation.;Psychosocial barriers identified (see note)      Screening Interventions   Interventions Encouraged to exercise;Provide feedback about the scores to participant;To provide support and resources with identified psychosocial needs    Expected Outcomes Short Term goal: Utilizing psychosocial counselor, staff and physician to assist with identification of specific Stressors or current issues interfering with healing process. Setting desired goal for each stressor or current issue identified.;Long Term Goal: Stressors or current issues are controlled or eliminated.;Short Term goal: Identification and review with participant of  any Quality of Life or Depression concerns found by scoring the questionnaire.;Long Term goal: The participant improves quality of Life and PHQ9 Scores as seen by post scores and/or verbalization of changes             Quality of Life Scores:  Quality of Life - 08/03/23 1107       Quality of Life   Select Quality of Life      Quality of Life Scores   Health/Function Pre 16.1 %    Socioeconomic Pre 20.75 %    Psych/Spiritual Pre 17 %    Family Pre 26.4 %    GLOBAL Pre 18.75 %            Scores of 19 and below usually indicate a poorer quality of life in these areas.  A difference of  2-3 points is a clinically meaningful difference.  A difference of 2-3 points in the total score of the Quality of Life Index has been associated with significant improvement in overall quality of life, self-image, physical symptoms, and general health in studies assessing change in quality of life.  PHQ-9: Review Flowsheet  More data exists      08/03/2023 06/02/2023 04/20/2023 03/25/2023  12/23/2022  Depression screen PHQ 2/9  Decreased Interest 2 0 0 0 0  Down, Depressed, Hopeless 2 0 0 0 0  PHQ - 2 Score 4 0 0 0 0  Altered sleeping 1 0 1 - -  Tired, decreased energy 2 0 1 - -  Change in appetite 0 0 0 - -  Feeling bad or failure about yourself  1 0 0 - -  Trouble concentrating 0 - - - -  Moving slowly or fidgety/restless 2 0 0 - -  Suicidal thoughts 0 0 0 - -  PHQ-9 Score 10 0 2 - -  Difficult doing work/chores Somewhat difficult Not difficult at all Not difficult at all - -    Details           Interpretation of Total Score  Total Score Depression Severity:  1-4 = Minimal depression, 5-9 = Mild depression, 10-14 = Moderate depression, 15-19 = Moderately severe depression, 20-27 = Severe depression   Psychosocial Evaluation and Intervention:   Psychosocial Re-Evaluation:  Psychosocial Re-Evaluation     Row Name 08/11/23 1159             Psychosocial Re-Evaluation   Current issues with Current Stress Concerns       Comments Osborn did not voice any concerns or stressors on his first day of exercise,       Interventions Stress management education;Encouraged to attend Cardiac Rehabilitation for the exercise;Relaxation education       Continue Psychosocial Services  Follow up required by staff         Initial Review   Source of Stress Concerns Unable to perform yard/household activities;Unable to participate in former interests or hobbies       Comments will continue to monitor  and offer support as needed                Psychosocial Discharge (Final Psychosocial Re-Evaluation):  Psychosocial Re-Evaluation - 08/11/23 1159       Psychosocial Re-Evaluation   Current issues with Current Stress Concerns    Comments Mikie did not voice any concerns or stressors on his first day of exercise,    Interventions Stress management education;Encouraged to attend Cardiac Rehabilitation for the exercise;Relaxation education    Continue Psychosocial Services  Follow up required by staff      Initial Review   Source of Stress Concerns Unable to perform yard/household activities;Unable to participate in former interests or hobbies    Comments will continue to monitor  and offer support as needed             Vocational Rehabilitation: Provide vocational rehab assistance to qualifying candidates.   Vocational Rehab Evaluation & Intervention:  Vocational Rehab - 08/03/23 1127       Initial Vocational Rehab Evaluation & Intervention   Assessment shows need for Vocational Rehabilitation Yes    Vocational Rehab Packet given to patient 08/03/23      Vocational Rehab Re-Evaulation   Comments Anticipating knee surgery but may be interested in vocational rehab services in the future.             Education: Education Goals: Education classes will be provided on a weekly basis, covering required topics. Participant will state understanding/return demonstration of topics presented.     Core Videos: Exercise    Move It!  Clinical staff conducted group or individual video education with verbal and written material and guidebook.  Patient learns the recommended Pritikin exercise program. Exercise with the goal of living a long, healthy life. Some of the health benefits of exercise include controlled diabetes, healthier blood pressure levels, improved cholesterol levels, improved heart and lung capacity, improved sleep, and better body composition. Everyone should speak with their doctor before starting or changing an exercise routine.  Biomechanical Limitations Clinical staff conducted group or individual video education with verbal and written material and guidebook.  Patient learns how biomechanical limitations can impact exercise and how we can mitigate and possibly overcome limitations to have an impactful and balanced exercise routine.  Body Composition Clinical staff conducted group or individual video education with verbal and written  material and guidebook.  Patient learns that body composition (ratio of muscle mass to fat mass) is a key component to assessing overall fitness, rather than body weight alone. Increased fat mass, especially visceral belly fat, can put [place] at increased risk for metabolic syndrome, type 2 diabetes, heart disease, and even death. It is recommended to combine diet and exercise (cardiovascular and resistance training) to improve your body composition. Seek guidance from your physician and exercise physiologist before implementing an exercise routine.  Exercise Action Plan Clinical staff conducted group or individual video education with verbal and written material and guidebook.  Patient learns the recommended strategies to achieve and enjoy long-term exercise adherence, including variety, self-motivation, self-efficacy, and positive decision making. Benefits of exercise include fitness, good health, weight management, more energy, better sleep, less stress, and overall well-being.  Medical   Heart Disease Risk Reduction Clinical staff conducted group or individual video education with verbal and written material and guidebook.  Patient learns our heart is our most vital organ as it circulates oxygen, nutrients, white blood cells, and hormones throughout the entire body, and carries waste away. Data supports a plant-based eating plan like the Pritikin Program for its effectiveness in slowing progression of and reversing heart disease. The video provides a number of recommendations to address heart disease.   Metabolic Syndrome and Belly Fat  Clinical staff conducted group or individual video education with verbal and written material and guidebook.  Patient learns what metabolic syndrome is, how it leads to heart disease, and how one can reverse it and keep it from coming back. You have metabolic syndrome if you have 3 of the following 5  criteria: abdominal obesity, high blood pressure, high triglycerides,  low HDL cholesterol, and high blood sugar.  Hypertension and Heart Disease Clinical staff conducted group or individual video education with verbal and written material and guidebook.  Patient learns that high blood pressure, or hypertension, is very common in the [place]. Hypertension is largely due to excessive salt intake, but other important risk factors include being overweight, physical inactivity, drinking too much alcohol, smoking, and not eating enough potassium from fruits and vegetables. High blood pressure is a leading risk factor for heart attack, stroke, congestive heart failure, dementia, kidney failure, and premature death. Long-term effects of excessive salt intake include stiffening of the arteries and thickening of heart muscle and organ damage. Recommendations include ways to reduce hypertension and the risk of heart disease.  Diseases of Our Time - Focusing on Diabetes Clinical staff conducted group or individual video education with verbal and written material and guidebook.  Patient learns why the best way to stop diseases of our time is prevention, through food and other lifestyle changes. Medicine (such as prescription pills and surgeries) is often only a Band-Aid on the problem, not a long-term solution. Most common diseases of our time include obesity, type 2 diabetes, hypertension, heart disease, and cancer. The Pritikin Program is recommended and has been proven to help reduce, reverse, and/or prevent the damaging effects of metabolic syndrome.  Nutrition   Overview of the Pritikin Eating Plan  Clinical staff conducted group or individual video education with verbal and written material and guidebook.  Patient learns about the Pritikin Eating Plan for disease risk reduction. The Pritikin Eating Plan emphasizes a wide variety of unrefined, minimally-processed carbohydrates, like fruits, vegetables, whole grains, and legumes. Go, Caution, and Stop food choices are  explained. Plant-based and lean animal proteins are emphasized. Rationale provided for low sodium intake for blood pressure control, low added sugars for blood sugar stabilization, and low added fats and oils for coronary artery disease risk reduction and weight management.  Calorie Density  Clinical staff conducted group or individual video education with verbal and written material and guidebook.  Patient learns about calorie density and how it impacts the Pritikin Eating Plan. Knowing the characteristics of the food you choose will help you decide whether those foods will lead to weight gain or weight loss, and whether you want to consume more or less of them. Weight loss is usually a side effect of the Pritikin Eating Plan because of its focus on low calorie-dense foods.  Label Reading  Clinical staff conducted group or individual video education with verbal and written material and guidebook.  Patient learns about the Pritikin recommended label reading guidelines and corresponding recommendations regarding calorie density, added sugars, sodium content, and whole grains.  Dining Out - Part 1  Clinical staff conducted group or individual video education with verbal and written material and guidebook.  Patient learns that restaurant meals can be sabotaging because they can be so high in calories, fat, sodium, and/or sugar. Patient learns recommended strategies on how to positively address this and avoid unhealthy pitfalls.  Facts on Fats  Clinical staff conducted group or individual video education with verbal and written material and guidebook.  Patient learns that lifestyle modifications can be just as effective, if not more so, as many medications for lowering your risk of heart disease. A Pritikin lifestyle can help to reduce your risk of inflammation and atherosclerosis (cholesterol build-up, or plaque, in the artery walls). Lifestyle interventions such as dietary choices  and physical activity  address the cause of atherosclerosis. A review of the types of fats and their impact on blood cholesterol levels, along with dietary recommendations to reduce fat intake is also included.  Nutrition Action Plan  Clinical staff conducted group or individual video education with verbal and written material and guidebook.  Patient learns how to incorporate Pritikin recommendations into their lifestyle. Recommendations include planning and keeping personal health goals in mind as an important part of their success.  Healthy Mind-Set    Healthy Minds, Bodies, Hearts  Clinical staff conducted group or individual video education with verbal and written material and guidebook.  Patient learns how to identify when they are stressed. Video will discuss the impact of that stress, as well as the many benefits of stress management. Patient will also be introduced to stress management techniques. The way we think, act, and feel has an impact on our hearts.  How Our Thoughts Can Heal Our Hearts  Clinical staff conducted group or individual video education with verbal and written material and guidebook.  Patient learns that negative thoughts can cause depression and anxiety. This can result in negative lifestyle behavior and serious health problems. Cognitive behavioral therapy is an effective method to help control our thoughts in order to change and improve our emotional outlook.  Additional Videos:  Exercise    Improving Performance  Clinical staff conducted group or individual video education with verbal and written material and guidebook.  Patient learns to use a non-linear approach by alternating intensity levels and lengths of time spent exercising to help burn more calories and lose more body fat. Cardiovascular exercise helps improve heart health, metabolism, hormonal balance, blood sugar control, and recovery from fatigue. Resistance training improves strength, endurance, balance, coordination,  reaction time, metabolism, and muscle mass. Flexibility exercise improves circulation, posture, and balance. Seek guidance from your physician and exercise physiologist before implementing an exercise routine and learn your capabilities and proper form for all exercise.  Introduction to Yoga  Clinical staff conducted group or individual video education with verbal and written material and guidebook.  Patient learns about yoga, a discipline of the coming together of mind, breath, and body. The benefits of yoga include improved flexibility, improved range of motion, better posture and core strength, increased lung function, weight loss, and positive self-image. Yoga's heart health benefits include lowered blood pressure, healthier heart rate, decreased cholesterol and triglyceride levels, improved immune function, and reduced stress. Seek guidance from your physician and exercise physiologist before implementing an exercise routine and learn your capabilities and proper form for all exercise.  Medical   Aging: Enhancing Your Quality of Life  Clinical staff conducted group or individual video education with verbal and written material and guidebook.  Patient learns key strategies and recommendations to stay in good physical health and enhance quality of life, such as prevention strategies, having an advocate, securing a Health Care Proxy and Power of Attorney, and keeping a list of medications and system for tracking them. It also discusses how to avoid risk for bone loss.  Biology of Weight Control  Clinical staff conducted group or individual video education with verbal and written material and guidebook.  Patient learns that weight gain occurs because we consume more calories than we burn (eating more, moving less). Even if your body weight is normal, you may have higher ratios of fat compared to muscle mass. Too much body fat puts you at increased risk for cardiovascular disease, heart attack, stroke,  type 2  diabetes, and obesity-related cancers. In addition to exercise, following the Pritikin Eating Plan can help reduce your risk.  Decoding Lab Results  Clinical staff conducted group or individual video education with verbal and written material and guidebook.  Patient learns that lab test reflects one measurement whose values change over time and are influenced by many factors, including medication, stress, sleep, exercise, food, hydration, pre-existing medical conditions, and more. It is recommended to use the knowledge from this video to become more involved with your lab results and evaluate your numbers to speak with your doctor.   Diseases of Our Time - Overview  Clinical staff conducted group or individual video education with verbal and written material and guidebook.  Patient learns that according to the CDC, 50% to 70% of chronic diseases (such as obesity, type 2 diabetes, elevated lipids, hypertension, and heart disease) are avoidable through lifestyle improvements including healthier food choices, listening to satiety cues, and increased physical activity.  Sleep Disorders Clinical staff conducted group or individual video education with verbal and written material and guidebook.  Patient learns how good quality and duration of sleep are important to overall health and well-being. Patient also learns about sleep disorders and how they impact health along with recommendations to address them, including discussing with a physician.  Nutrition  Dining Out - Part 2 Clinical staff conducted group or individual video education with verbal and written material and guidebook.  Patient learns how to plan ahead and communicate in order to maximize their dining experience in a healthy and nutritious manner. Included are recommended food choices based on the type of restaurant the patient is visiting.   Fueling a Banker conducted group or individual video education with  verbal and written material and guidebook.  There is a strong connection between our food choices and our health. Diseases like obesity and type 2 diabetes are very prevalent and are in large-part due to lifestyle choices. The Pritikin Eating Plan provides plenty of food and hunger-curbing satisfaction. It is easy to follow, affordable, and helps reduce health risks.  Menu Workshop  Clinical staff conducted group or individual video education with verbal and written material and guidebook.  Patient learns that restaurant meals can sabotage health goals because they are often packed with calories, fat, sodium, and sugar. Recommendations include strategies to plan ahead and to communicate with the manager, chef, or server to help order a healthier meal.  Planning Your Eating Strategy  Clinical staff conducted group or individual video education with verbal and written material and guidebook.  Patient learns about the Pritikin Eating Plan and its benefit of reducing the risk of disease. The Pritikin Eating Plan does not focus on calories. Instead, it emphasizes high-quality, nutrient-rich foods. By knowing the characteristics of the foods, we choose, we can determine their calorie density and make informed decisions.  Targeting Your Nutrition Priorities  Clinical staff conducted group or individual video education with verbal and written material and guidebook.  Patient learns that lifestyle habits have a tremendous impact on disease risk and progression. This video provides eating and physical activity recommendations based on your personal health goals, such as reducing LDL cholesterol, losing weight, preventing or controlling type 2 diabetes, and reducing high blood pressure.  Vitamins and Minerals  Clinical staff conducted group or individual video education with verbal and written material and guidebook.  Patient learns different ways to obtain key vitamins and minerals, including through a  recommended healthy diet. It is important to  discuss all supplements you take with your doctor.   Healthy Mind-Set    Smoking Cessation  Clinical staff conducted group or individual video education with verbal and written material and guidebook.  Patient learns that cigarette smoking and tobacco addiction pose a serious health risk which affects millions of people. Stopping smoking will significantly reduce the risk of heart disease, lung disease, and many forms of cancer. Recommended strategies for quitting are covered, including working with your doctor to develop a successful plan.  Culinary   Becoming a Set designer conducted group or individual video education with verbal and written material and guidebook.  Patient learns that cooking at home can be healthy, cost-effective, quick, and puts them in control. Keys to cooking healthy recipes will include looking at your recipe, assessing your equipment needs, planning ahead, making it simple, choosing cost-effective seasonal ingredients, and limiting the use of added fats, salts, and sugars.  Cooking - Breakfast and Snacks  Clinical staff conducted group or individual video education with verbal and written material and guidebook.  Patient learns how important breakfast is to satiety and nutrition through the entire day. Recommendations include key foods to eat during breakfast to help stabilize blood sugar levels and to prevent overeating at meals later in the day. Planning ahead is also a key component.  Cooking - Educational psychologist conducted group or individual video education with verbal and written material and guidebook.  Patient learns eating strategies to improve overall health, including an approach to cook more at home. Recommendations include thinking of animal protein as a side on your plate rather than center stage and focusing instead on lower calorie dense options like vegetables, fruits, whole  grains, and plant-based proteins, such as beans. Making sauces in large quantities to freeze for later and leaving the skin on your vegetables are also recommended to maximize your experience.  Cooking - Healthy Salads and Dressing Clinical staff conducted group or individual video education with verbal and written material and guidebook.  Patient learns that vegetables, fruits, whole grains, and legumes are the foundations of the Pritikin Eating Plan. Recommendations include how to incorporate each of these in flavorful and healthy salads, and how to create homemade salad dressings. Proper handling of ingredients is also covered. Cooking - Soups and [employer] - Soups and Desserts Clinical staff conducted group or individual video education with verbal and written material and guidebook.  Patient learns that Pritikin soups and desserts make for easy, nutritious, and delicious snacks and meal components that are low in sodium, fat, sugar, and calorie density, while high in vitamins, minerals, and filling fiber. Recommendations include simple and healthy ideas for soups and desserts.   Overview     The Pritikin Solution Program Overview Clinical staff conducted group or individual video education with verbal and written material and guidebook.  Patient learns that the results of the Pritikin Program have been documented in more than 100 articles published in peer-reviewed journals, and the benefits include reducing risk factors for (and, in some cases, even reversing) high cholesterol, high blood pressure, type 2 diabetes, obesity, and more! An overview of the three key pillars of the Pritikin Program will be covered: eating well, doing regular exercise, and having a healthy mind-set.  WORKSHOPS  Exercise: Exercise Basics: Building Your Action Plan Clinical staff led group instruction and group discussion with PowerPoint presentation and patient guidebook. To enhance the learning environment  the use of posters, models and videos  may be added. At the conclusion of this workshop, patients will comprehend the difference between physical activity and exercise, as well as the benefits of incorporating both, into their routine. Patients will understand the FITT (Frequency, Intensity, Time, and Type) principle and how to use it to build an exercise action plan. In addition, safety concerns and other considerations for exercise and cardiac rehab will be addressed by the presenter. The purpose of this lesson is to promote a comprehensive and effective weekly exercise routine in order to improve patients' overall level of fitness.   Managing Heart Disease: Your Path to a Healthier Heart Clinical staff led group instruction and group discussion with PowerPoint presentation and patient guidebook. To enhance the learning environment the use of posters, models and videos may be added.At the conclusion of this workshop, patients will understand the anatomy and physiology of the heart. Additionally, they will understand how Pritikin's three pillars impact the risk factors, the progression, and the management of heart disease.  The purpose of this lesson is to provide a high-level overview of the heart, heart disease, and how the Pritikin lifestyle positively impacts risk factors.  Exercise Biomechanics Clinical staff led group instruction and group discussion with PowerPoint presentation and patient guidebook. To enhance the learning environment the use of posters, models and videos may be added. Patients will learn how the structural parts of their bodies function and how these functions impact their daily activities, movement, and exercise. Patients will learn how to promote a neutral spine, learn how to manage pain, and identify ways to improve their physical movement in order to promote healthy living. The purpose of this lesson is to expose patients to common physical limitations that impact  physical activity. Participants will learn practical ways to adapt and manage aches and pains, and to minimize their effect on regular exercise. Patients will learn how to maintain good posture while sitting, walking, and lifting.  Balance Training and Fall Prevention  Clinical staff led group instruction and group discussion with PowerPoint presentation and patient guidebook. To enhance the learning environment the use of posters, models and videos may be added. At the conclusion of this workshop, patients will understand the importance of their sensorimotor skills (vision, proprioception, and the vestibular system) in maintaining their ability to balance as they age. Patients will apply a variety of balancing exercises that are appropriate for their current level of function. Patients will understand the common causes for poor balance, possible solutions to these problems, and ways to modify their physical environment in order to minimize their fall risk. The purpose of this lesson is to teach patients about the importance of maintaining balance as they age and ways to minimize their risk of falling.  WORKSHOPS   Nutrition:  Fueling a Ship broker led group instruction and group discussion with PowerPoint presentation and patient guidebook. To enhance the learning environment the use of posters, models and videos may be added. Patients will review the foundational principles of the Pritikin Eating Plan and understand what constitutes a serving size in each of the food groups. Patients will also learn Pritikin-friendly foods that are better choices when away from home and review make-ahead meal and snack options. Calorie density will be reviewed and applied to three nutrition priorities: weight maintenance, weight loss, and weight gain. The purpose of this lesson is to reinforce (in a group setting) the key concepts around what patients are recommended to eat and how to apply these  guidelines when away from home  by planning and selecting Pritikin-friendly options. Patients will understand how calorie density may be adjusted for different weight management goals.  Mindful Eating  Clinical staff led group instruction and group discussion with PowerPoint presentation and patient guidebook. To enhance the learning environment the use of posters, models and videos may be added. Patients will briefly review the concepts of the Pritikin Eating Plan and the importance of low-calorie dense foods. The concept of mindful eating will be introduced as well as the importance of paying attention to internal hunger signals. Triggers for non-hunger eating and techniques for dealing with triggers will be explored. The purpose of this lesson is to provide patients with the opportunity to review the basic principles of the Pritikin Eating Plan, discuss the value of eating mindfully and how to measure internal cues of hunger and fullness using the Hunger Scale. Patients will also discuss reasons for non-hunger eating and learn strategies to use for controlling emotional eating.  Targeting Your Nutrition Priorities Clinical staff led group instruction and group discussion with PowerPoint presentation and patient guidebook. To enhance the learning environment the use of posters, models and videos may be added. Patients will learn how to determine their genetic susceptibility to disease by reviewing their family history. Patients will gain insight into the importance of diet as part of an overall healthy lifestyle in mitigating the impact of genetics and other environmental insults. The purpose of this lesson is to provide patients with the opportunity to assess their personal nutrition priorities by looking at their family history, their own health history and current risk factors. Patients will also be able to discuss ways of prioritizing and modifying the Pritikin Eating Plan for their highest risk  areas  Menu  Clinical staff led group instruction and group discussion with PowerPoint presentation and patient guidebook. To enhance the learning environment the use of posters, models and videos may be added. Using menus brought in from [employer], or printed from [employer], patients will apply the Pritikin dining out guidelines that were presented in the [employer] video. Patients will also be able to practice these guidelines in a variety of provided scenarios. The purpose of this lesson is to provide patients with the opportunity to practice hands-on learning of the Pritikin Dining Out guidelines with actual menus and practice scenarios.  Label Reading Clinical staff led group instruction and group discussion with PowerPoint presentation and patient guidebook. To enhance the learning environment the use of posters, models and videos may be added. Patients will review and discuss the Pritikin label reading guidelines presented in Pritikin's Label Reading Educational series video. Using fool labels brought in from local grocery stores and markets, patients will apply the label reading guidelines and determine if the packaged food meet the Pritikin guidelines. The purpose of this lesson is to provide patients with the opportunity to review, discuss, and practice hands-on learning of the Pritikin Label Reading guidelines with actual packaged food labels. Cooking School  Pritikin's [employer] are designed to teach patients ways to prepare quick, simple, and affordable recipes at home. The importance of nutrition's role in chronic disease risk reduction is reflected in its emphasis in the overall Pritikin program. By learning how to prepare essential core Pritikin Eating Plan recipes, patients will increase control over what they eat; be able to customize the flavor of foods without the use of added salt, sugar, or fat; and improve the quality of the food they  consume. By learning a set of core  recipes which are easily assembled, quickly prepared, and affordable, patients are more likely to prepare more healthy foods at home. These workshops focus on convenient breakfasts, simple entres, side dishes, and desserts which can be prepared with minimal effort and are consistent with nutrition recommendations for cardiovascular risk reduction. Cooking [employer] are taught by a Armed forces logistics/support/administrative officer (RD) who has been trained by the [employer]. The chef or RD has a clear understanding of the importance of minimizing - if not completely eliminating - added fat, sugar, and sodium in recipes. Throughout the series of Cooking School Workshop sessions, patients will learn about healthy ingredients and efficient methods of cooking to build confidence in their capability to prepare    Cooking School weekly topics:  Adding Flavor- Sodium-Free  Fast and Healthy Breakfasts  Powerhouse Plant-Based Proteins  Satisfying Salads and Dressings  Simple Sides and Sauces  International Cuisine-Spotlight on the [employer] Zones  Delicious Desserts  Savory Soups  [employer] - Meals in a Astronomer Appetizers and Snacks  Comforting Weekend Breakfasts  One-Pot Wonders   Fast Evening Meals  Landscape architect Your Pritikin Plate  WORKSHOPS   Healthy Mindset (Psychosocial):  Focused Goals, Sustainable Changes Clinical staff led group instruction and group discussion with PowerPoint presentation and patient guidebook. To enhance the learning environment the use of posters, models and videos may be added. Patients will be able to apply effective goal setting strategies to establish at least one personal goal, and then take consistent, meaningful action toward that goal. They will learn to identify common barriers to achieving personal goals and develop strategies to overcome them. Patients will also gain an understanding of how our  mind-set can impact our ability to achieve goals and the importance of cultivating a positive and growth-oriented mind-set. The purpose of this lesson is to provide patients with a deeper understanding of how to set and achieve personal goals, as well as the tools and strategies needed to overcome common obstacles which may arise along the way.  From Head to Heart: The Power of a Healthy Outlook  Clinical staff led group instruction and group discussion with PowerPoint presentation and patient guidebook. To enhance the learning environment the use of posters, models and videos may be added. Patients will be able to recognize and describe the impact of emotions and mood on physical health. They will discover the importance of self-care and explore self-care practices which may work for them. Patients will also learn how to utilize the 4 C's to cultivate a healthier outlook and better manage stress and challenges. The purpose of this lesson is to demonstrate to patients how a healthy outlook is an essential part of maintaining good health, especially as they continue their cardiac rehab journey.  Healthy Sleep for a Healthy Heart Clinical staff led group instruction and group discussion with PowerPoint presentation and patient guidebook. To enhance the learning environment the use of posters, models and videos may be added. At the conclusion of this workshop, patients will be able to demonstrate knowledge of the importance of sleep to overall health, well-being, and quality of life. They will understand the symptoms of, and treatments for, common sleep disorders. Patients will also be able to identify daytime and nighttime behaviors which impact sleep, and they will be able to apply these tools to help manage sleep-related challenges. The purpose of this lesson is to provide patients with a general overview of sleep and outline the importance of quality sleep.  Patients will learn about a few of the most common  sleep disorders. Patients will also be introduced to the concept of "sleep hygiene," and discover ways to self-manage certain sleeping problems through simple daily behavior changes. Finally, the workshop will motivate patients by clarifying the links between quality sleep and their goals of heart-healthy living.   Recognizing and Reducing Stress Clinical staff led group instruction and group discussion with PowerPoint presentation and patient guidebook. To enhance the learning environment the use of posters, models and videos may be added. At the conclusion of this workshop, patients will be able to understand the types of stress reactions, differentiate between acute and chronic stress, and recognize the impact that chronic stress has on their health. They will also be able to apply different coping mechanisms, such as reframing negative self-talk. Patients will have the opportunity to practice a variety of stress management techniques, such as deep abdominal breathing, progressive muscle relaxation, and/or guided imagery.  The purpose of this lesson is to educate patients on the role of stress in their lives and to provide healthy techniques for coping with it.  Learning Barriers/Preferences:  Learning Barriers/Preferences - 08/03/23 1103       Learning Barriers/Preferences   Learning Barriers None    Learning Preferences Audio;Written Material;Pictoral             Education Topics:  Knowledge Questionnaire Score:  Knowledge Questionnaire Score - 08/03/23 1118       Knowledge Questionnaire Score   Pre Score 23/24             Core Components/Risk Factors/Patient Goals at Admission:  Personal Goals and Risk Factors at Admission - 08/03/23 1100       Core Components/Risk Factors/Patient Goals on Admission    Weight Management Yes;Obesity    Intervention Weight Management/Obesity: Establish reasonable short term and long term weight goals.;Obesity: Provide education and  appropriate resources to help participant work on and attain dietary goals.    Admit Weight 227 lb 11.8 oz (103.3 kg)    Expected Outcomes Short Term: Continue to assess and modify interventions until short term weight is achieved;Long Term: Adherence to nutrition and physical activity/exercise program aimed toward attainment of established weight goal;Weight Loss: Understanding of general recommendations for a balanced deficit meal plan, which promotes 1-2 lb weight loss per week and includes a negative energy balance of [phone] kcal/d    Hypertension Yes    Intervention Provide education on lifestyle modifcations including regular physical activity/exercise, weight management, moderate sodium restriction and increased consumption of fresh fruit, vegetables, and low fat dairy, alcohol moderation, and smoking cessation.;Monitor prescription use compliance.    Expected Outcomes Short Term: Continued assessment and intervention until BP is < 140/28mm HG in hypertensive participants. < 130/69mm HG in hypertensive participants with diabetes, heart failure or chronic kidney disease.;Long Term: Maintenance of blood pressure at goal levels.    Lipids Yes    Intervention Provide education and support for participant on nutrition & aerobic/resistive exercise along with prescribed medications to achieve LDL 70mg , HDL >40mg .    Expected Outcomes Short Term: Participant states understanding of desired cholesterol values and is compliant with medications prescribed. Participant is following exercise prescription and nutrition guidelines.;Long Term: Cholesterol controlled with medications as prescribed, with individualized exercise RX and with personalized nutrition plan. Value goals: LDL < 70mg , HDL > 40 mg.    Stress Yes    Intervention Offer individual and/or small group education and counseling on adjustment to heart disease, stress management  and health-related lifestyle change. Teach and support self-help  strategies.    Expected Outcomes Short Term: Participant demonstrates changes in health-related behavior, relaxation and other stress management skills, ability to obtain effective social support, and compliance with psychotropic medications if prescribed.;Long Term: Emotional wellbeing is indicated by absence of clinically significant psychosocial distress or social isolation.    Personal Goal Other Yes    Personal Goal Be as active as he was before. Be able to eat heart healthy foods that taste good or enjoy the foods he's used to eating. Be able to live a normal life without worrying or feeling down.    Intervention Develop an exercise action plan that he can follow to build strength and endurance, so that he can resume his previous activity level. Develop a nutrition action plan incorporating heart healthy foods that taste good that he can incorporate into his meal planning. Develop techniques he can use to help cope with stress.    Expected Outcomes Norma will be able to resume his previous activites. He will comply with exercise and nutrition recommendations to live a healthy lifestyle. He will be able to cope with stressors.             Core Components/Risk Factors/Patient Goals Review:   Goals and Risk Factor Review     Row Name 08/11/23 1201             Core Components/Risk Factors/Patient Goals Review   Personal Goals Review Weight Management/Obesity;Stress;Hypertension;Lipids       Review Trillion started did well with exercise on 08/11/23. Vital signs were stable       Expected Outcomes Brice will continue to participate in cardiac rehab for exercise nutrtion and lifestyle modications                Core Components/Risk Factors/Patient Goals at Discharge (Final Review):   Goals and Risk Factor Review - 08/11/23 1201       Core Components/Risk Factors/Patient Goals Review   Personal Goals Review Weight Management/Obesity;Stress;Hypertension;Lipids    Review Fenris  started did well with exercise on 08/11/23. Vital signs were stable    Expected Outcomes Gyasi will continue to participate in cardiac rehab for exercise nutrtion and lifestyle modications             ITP Comments:  ITP Comments     Row Name 08/03/23 1020 08/11/23 1158         ITP Comments Medical Director- Dr. Driscilla Moats, MD. Introduction to [facility]  / Intensive Cardiac Rehab. Reviewed initial orientation folder with participant. 30 Day ITP Review. Hussan started cardiac rehab on 08/11/23. Abdurrahim did well with exercise.               Comments: See ITP Comments

## 2023-08-11 NOTE — Progress Notes (Signed)
Daily Session Note  Patient Details  Name: Timothy Browning MRN: [id number] Date of Birth: August 17, 1971 Referring Provider:   Flowsheet Row INTENSIVE CARDIAC REHAB ORIENT from 08/03/2023 in [facility]  Referring Provider Thomasene Ripple, DO       Encounter Date: 08/11/2023  Check In:  Session Check In - 08/11/23 1039       Check-In   Supervising physician immediately available to respond to emergencies [facility] MD immediately available    Physician(s) Edd Fabian, NP    Location [facility]    Staff Present Cristy Hilts, MS, ACSM-CEP, Exercise Physiologist;Jetta Dan Humphreys BS, ACSM-CEP, Exercise Physiologist;Ajene Carchi, RN, Marton Redwood, MS, ACSM-CEP, CCRP, Exercise Physiologist;Johnny Hale Bogus, MS, Exercise Physiologist    Virtual Visit No    Medication changes reported     No    Fall or balance concerns reported    No    Tobacco Cessation No Change    Warm-up and Cool-down Performed as group-led instruction    Resistance Training Performed No    VAD Patient? No    PAD/SET Patient? No      Pain Assessment   Currently in Pain? No/denies    Pain Score 0-No pain    Multiple Pain Sites No             Capillary Blood Glucose: No results found for this or any previous visit (from the past 24 hour(s)).   Exercise Prescription Changes - 08/11/23 1030       Response to Exercise   Blood Pressure (Admit) 118/70    Blood Pressure (Exercise) 126/78    Blood Pressure (Exit) 118/72    Heart Rate (Admit) 76 bpm    Heart Rate (Exercise) 95 bpm    Heart Rate (Exit) 87 bpm    Rating of Perceived Exertion (Exercise) 11    Symptoms None    Comments Off to a good start with exercise.    Duration Continue with 30 min of aerobic exercise without signs/symptoms of physical distress.    Intensity THRR unchanged      Progression   Progression Continue to progress workloads to maintain intensity without  signs/symptoms of physical distress.    Average METs 2.2      Resistance Training   Training Prescription No   Relaxation day, no weights.     Interval Training   Interval Training No      Recumbant Bike   Level 2    RPM 65    Watts 20    Minutes 15    METs 2.2      T5 Nustep   Level 2    SPM 97    Minutes 15    METs 2.3             Social History   Tobacco Use  Smoking Status Never  Smokeless Tobacco Never    Goals Met:  Exercise tolerated well No report of concerns or symptoms today  Goals Unmet:  Not Applicable  Comments: Pt started cardiac rehab today.  Pt tolerated light exercise without difficulty. VSS, telemetry-Sinus Rhythm, asymptomatic.  Medication list reconciled. Pt denies barriers to medicaiton compliance.  PSYCHOSOCIAL ASSESSMENT:  PHQ-10. Will review PHQ2-9 and quality of life in the upcoming week.   Pt oriented to exercise equipment and routine.    Understanding verbalized. Thayer Headings RN BSN

## 2023-08-11 NOTE — Patient Instructions (Addendum)
Thank you, [name] for allowing [place] to provide your care today.   Chest wall pain I think this is most likely nerve pain from your cardiac surgery in July. I will reach out to Dr. Servando Salina as well to make sure she doesn't want to see you again sooner.  Blood in urine You are being referred to [facility]. I will call with urine results from today  Please follow-up in 4-6 weeks to recheck kidney labs and blood count  I have ordered the following labs for you:   Lab Orders         Urinalysis, Reflex Microscopic      I have ordered the following medication/changed the following medications:   Stop the following medications: There are no discontinued medications.   Start the following medications: No orders of the defined types were placed in this encounter.    Follow up: 4-6 weeks  We look forward to seeing you next time. Please call our clinic at [phone] if you have any questions or concerns. The best time to call is Monday-Friday from 9am-4pm, but there is someone available 24/7. If after hours or the weekend, call the main hospital number and ask for the Internal Medicine Resident On-Call. If you need medication refills, please notify your pharmacy one week in advance and they will send [place] a request.   Thank you for trusting me with your care. Wishing you the best!   Rudene Christians, DO [facility]

## 2023-08-12 DIAGNOSIS — M79602 Pain in left arm: Secondary | ICD-10-CM | POA: Insufficient documentation

## 2023-08-12 LAB — URINALYSIS, ROUTINE W REFLEX MICROSCOPIC
Bilirubin, UA: NEGATIVE
Glucose, UA: NEGATIVE
Ketones, UA: NEGATIVE
Leukocytes,UA: NEGATIVE
Nitrite, UA: NEGATIVE
Protein,UA: NEGATIVE
RBC, UA: NEGATIVE
Specific Gravity, UA: 1.019 (ref 1.005–1.030)
Urobilinogen, Ur: 0.2 mg/dL (ref 0.2–1.0)
pH, UA: 5.5 (ref 5.0–7.5)

## 2023-08-12 NOTE — Assessment & Plan Note (Signed)
Patient presents for follow-up on hematuria.  First episode of this was in August.  He had brown urine and went to the emergency room.  At that time dipstick showed large hgb. He does not smoke and no family history of bladder cancer. In the past he followed with urology for BPH and last PSA was 3.7 in 4/24. P: Repeat UA without hgb He will follow-up with [facility] or [facility] for BPH

## 2023-08-12 NOTE — Assessment & Plan Note (Addendum)
Patient presents with episodes of left arm pain. Last week he reports he had sudden onset pain that started in left armpit and traveled into left fingers. He went to ED after talking with cardiology office. ED showed reassuring EKG and troponins. Since last week, he has had milder symptoms that are intermittent. He has pain from armpit into finger but numbness/ tingling is not as severe. He went to cardiac rehab today and was able to complete exercises without symptoms. A:  These episodes do not sound like a typical anginal pattern.  His left heart cath in 9/23 showed normal coronary arteries. 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 I was not able to reproduce symptoms with motions of arm or compression of axilla. P: I will reach out to Dr. Servando Salina to see her thoughts on events or if patient needs to follow-up with cardiology or CT surgery  Addendum 10/25: Dr. Servando Salina agrees that episode sounds concerning for nerve pain, she is not worried about cardiac etiology.

## 2023-08-13 ENCOUNTER — Encounter (HOSPITAL_COMMUNITY)
Admission: RE | Admit: 2023-08-13 | Discharge: 2023-08-13 | Disposition: A | Discharge status: Home / Self Care | Payer: Medicaid Other | Source: Ambulatory Visit | Attending: Cardiology | Admitting: Cardiology

## 2023-08-13 DIAGNOSIS — Z9889 Other specified postprocedural states: Secondary | ICD-10-CM | POA: Diagnosis not present

## 2023-08-13 DIAGNOSIS — Z952 Presence of prosthetic heart valve: Secondary | ICD-10-CM

## 2023-08-13 NOTE — Addendum Note (Signed)
Addended by: Lucille Passy on: 08/13/2023 03:42 PM   Modules accepted: Level of Service

## 2023-08-13 NOTE — Progress Notes (Signed)
QUALITY OF LIFE SCORE REVIEW  Pt completed Quality of Life survey as a participant in Cardiac Rehab.  Scores 21.0 or below are considered low.  Pt score very low in several areas Overall 18.75, Health and Function 16.1, socioeconomic 20.75, physiological and spiritual 17, family 26.4. Patient quality of life slightly altered by physical constraints which limits ability to perform as prior to recent cardiac illness. Timothy Browning says he is dissatisfied with his health due to his recent open heart surgery. Timothy Browning denies being depressed currently. Timothy Browning says he has experienced  some panic  and anxiety at times. Timothy Browning says he is receiving counseling initiated by his Primary care provider. Timothy Browning says he has an appointment coming up soon.  Offered emotional support and reassurance. Will continue to monitor and intervene as necessary.  Timothy Browning says he is in the process of applying for disability. Timothy Browning was given an application for vocational rehab. Timothy Browning says he is not interested in vocational rehab currently. Will continue to monitor the patient throughout  the program.Timothy Browning [name] RN BSN

## 2023-08-16 ENCOUNTER — Encounter (HOSPITAL_COMMUNITY)
Admission: RE | Admit: 2023-08-16 | Discharge: 2023-08-16 | Disposition: A | Discharge status: Home / Self Care | Payer: Medicaid Other | Source: Ambulatory Visit | Attending: Cardiology | Admitting: Cardiology

## 2023-08-16 ENCOUNTER — Other Ambulatory Visit (HOSPITAL_COMMUNITY): Payer: Self-pay

## 2023-08-16 DIAGNOSIS — Z9889 Other specified postprocedural states: Secondary | ICD-10-CM

## 2023-08-16 DIAGNOSIS — Z952 Presence of prosthetic heart valve: Secondary | ICD-10-CM | POA: Diagnosis not present

## 2023-08-18 ENCOUNTER — Ambulatory Visit: Payer: Medicaid Other | Attending: Internal Medicine

## 2023-08-18 ENCOUNTER — Encounter (HOSPITAL_COMMUNITY)
Admission: RE | Admit: 2023-08-18 | Discharge: 2023-08-18 | Disposition: A | Discharge status: Home / Self Care | Payer: Medicaid Other | Source: Ambulatory Visit | Attending: Cardiology | Admitting: Cardiology

## 2023-08-18 DIAGNOSIS — Z5181 Encounter for therapeutic drug level monitoring: Secondary | ICD-10-CM | POA: Diagnosis not present

## 2023-08-18 DIAGNOSIS — Z952 Presence of prosthetic heart valve: Secondary | ICD-10-CM

## 2023-08-18 DIAGNOSIS — I480 Paroxysmal atrial fibrillation: Secondary | ICD-10-CM

## 2023-08-18 DIAGNOSIS — Z9889 Other specified postprocedural states: Secondary | ICD-10-CM | POA: Diagnosis not present

## 2023-08-18 LAB — POCT INR: INR: 2.2 (ref 2.0–3.0)

## 2023-08-18 NOTE — Progress Notes (Signed)
Patient says that his left arm and chest are sore rates a 3-4 on a 1-10 scale. Blood pressure 118/68. Heart rate 72. Denies having actual chest pain. Patient went to the ED on 08/05/23 with the same symptoms cleared to exercise per Dr Servando Salina. Will check with onsite provider Robin Searing NP Montez Hageman onsite provider.

## 2023-08-18 NOTE — Progress Notes (Signed)
Reviewed home exercise guidelines with Timothy Browning including endpoints, temperature precautions, target heart rate and rate of perceived exertion. He is currently walking 10-15 minutes 2 days/week as his mode of home exercise. He is nervous about exercising more at home because of fatigue that he's experiencing. We discussed progressing workloads at cardiac rehab to build confidence with exercise. Also, discussed gradually increasing duration at home by adding 1-2 minutes each session as tolerated with a goal of achieving 30 minutes walking 2 days/week. He voices understanding of instructions given.  Timothy Browning continues to c/o left arm pain, which he has discussed with his physician. Will continue to monitor and modify exercise as needed.  Timothy Pais, MS, ACSM CEP

## 2023-08-18 NOTE — Patient Instructions (Signed)
Description   Take an extra 1/2 tablet today and then continue taking warfarin 2 TABLETS DAILY EXCEPT 3 TABLETS ON SATURDAYS.   Recheck INR in 2 weeks. Stay consistent with greens each week-2 times per week.  C[facility]in Clinic [phone] Amio stopped on 07/22/23.

## 2023-08-19 NOTE — Progress Notes (Signed)
Internal Medicine Clinic Attending  Case discussed with the resident at the time of the visit.  We reviewed the resident's history and exam and pertinent patient test results.  I agree with the assessment, diagnosis, and plan of care documented in the resident's note.  

## 2023-08-20 ENCOUNTER — Encounter (HOSPITAL_COMMUNITY)
Admission: RE | Admit: 2023-08-20 | Discharge: 2023-08-20 | Disposition: A | Discharge status: Home / Self Care | Payer: Medicaid Other | Source: Ambulatory Visit | Attending: Cardiology | Admitting: Cardiology

## 2023-08-20 DIAGNOSIS — Z9889 Other specified postprocedural states: Secondary | ICD-10-CM | POA: Diagnosis not present

## 2023-08-20 DIAGNOSIS — Z952 Presence of prosthetic heart valve: Secondary | ICD-10-CM

## 2023-08-20 DIAGNOSIS — G4733 Obstructive sleep apnea (adult) (pediatric): Secondary | ICD-10-CM | POA: Diagnosis not present

## 2023-08-20 DIAGNOSIS — Z419 Encounter for procedure for purposes other than remedying health state, unspecified: Secondary | ICD-10-CM | POA: Diagnosis not present

## 2023-08-23 ENCOUNTER — Encounter (HOSPITAL_COMMUNITY)
Admission: RE | Admit: 2023-08-23 | Discharge: 2023-08-23 | Disposition: A | Discharge status: Home / Self Care | Payer: Medicaid Other | Source: Ambulatory Visit | Attending: Cardiology

## 2023-08-23 DIAGNOSIS — Z9889 Other specified postprocedural states: Secondary | ICD-10-CM | POA: Diagnosis not present

## 2023-08-23 DIAGNOSIS — Z952 Presence of prosthetic heart valve: Secondary | ICD-10-CM | POA: Diagnosis not present

## 2023-08-25 ENCOUNTER — Encounter (HOSPITAL_COMMUNITY)
Admission: RE | Admit: 2023-08-25 | Discharge: 2023-08-25 | Disposition: A | Discharge status: Home / Self Care | Payer: Medicaid Other | Source: Ambulatory Visit | Attending: Cardiology | Admitting: Cardiology

## 2023-08-25 DIAGNOSIS — Z952 Presence of prosthetic heart valve: Secondary | ICD-10-CM | POA: Diagnosis not present

## 2023-08-25 DIAGNOSIS — Z9889 Other specified postprocedural states: Secondary | ICD-10-CM | POA: Diagnosis not present

## 2023-08-26 ENCOUNTER — Telehealth: Payer: Self-pay | Admitting: Student

## 2023-08-26 NOTE — Telephone Encounter (Signed)
Pt states he needs a letter for [employer]. Pt states he was sent a notice that he does not qualify and will need a Letter from his Dr. about his conditions that will qualify him for Food Stamps.  Pt is requesting a call back.

## 2023-08-26 NOTE — Telephone Encounter (Signed)
RTC to patient needs a letter stating why he is unable to work. Needs to go to the Department of SS.  Patient states would either come by to pick up of have mailed to his home.

## 2023-08-27 ENCOUNTER — Encounter (HOSPITAL_COMMUNITY)
Admission: RE | Admit: 2023-08-27 | Discharge: 2023-08-27 | Disposition: A | Discharge status: Home / Self Care | Payer: Medicaid Other | Source: Ambulatory Visit | Attending: Cardiology | Admitting: Cardiology

## 2023-08-27 DIAGNOSIS — Z952 Presence of prosthetic heart valve: Secondary | ICD-10-CM | POA: Diagnosis not present

## 2023-08-27 DIAGNOSIS — Z9889 Other specified postprocedural states: Secondary | ICD-10-CM | POA: Diagnosis not present

## 2023-08-30 ENCOUNTER — Encounter (HOSPITAL_COMMUNITY)
Admission: RE | Admit: 2023-08-30 | Discharge: 2023-08-30 | Disposition: A | Discharge status: Home / Self Care | Payer: Medicaid Other | Source: Ambulatory Visit | Attending: Cardiology

## 2023-08-30 DIAGNOSIS — Z952 Presence of prosthetic heart valve: Secondary | ICD-10-CM

## 2023-08-30 DIAGNOSIS — Z9889 Other specified postprocedural states: Secondary | ICD-10-CM | POA: Diagnosis not present

## 2023-08-31 ENCOUNTER — Ambulatory Visit: Payer: Medicaid Other | Attending: Cardiology | Admitting: *Deleted

## 2023-08-31 DIAGNOSIS — I480 Paroxysmal atrial fibrillation: Secondary | ICD-10-CM | POA: Diagnosis not present

## 2023-08-31 DIAGNOSIS — Z952 Presence of prosthetic heart valve: Secondary | ICD-10-CM

## 2023-08-31 DIAGNOSIS — I4891 Unspecified atrial fibrillation: Secondary | ICD-10-CM | POA: Diagnosis not present

## 2023-08-31 DIAGNOSIS — Z5181 Encounter for therapeutic drug level monitoring: Secondary | ICD-10-CM | POA: Diagnosis not present

## 2023-08-31 LAB — POCT INR: INR: 1.9 — AB (ref 2.0–3.0)

## 2023-08-31 NOTE — Patient Instructions (Addendum)
Description   Take 3 tablets of warfarin today and then START taking warfarin 2 TABLETS DAILY EXCEPT 3 TABLETS ON TUESDAYS AND SATURDAYS.   Recheck INR in 2 weeks. Stay consistent with greens each week-2 times per week.  Coumadin Clinic [phone] Amio stopped on 07/22/23.

## 2023-09-01 ENCOUNTER — Encounter (HOSPITAL_COMMUNITY)
Admission: RE | Admit: 2023-09-01 | Discharge: 2023-09-01 | Disposition: A | Discharge status: Home / Self Care | Payer: Medicaid Other | Source: Ambulatory Visit | Attending: Cardiology | Admitting: Cardiology

## 2023-09-01 DIAGNOSIS — Z952 Presence of prosthetic heart valve: Secondary | ICD-10-CM | POA: Diagnosis not present

## 2023-09-01 DIAGNOSIS — Z9889 Other specified postprocedural states: Secondary | ICD-10-CM

## 2023-09-03 ENCOUNTER — Encounter: Payer: Medicaid Other | Admitting: Internal Medicine

## 2023-09-06 ENCOUNTER — Encounter (HOSPITAL_COMMUNITY)
Admission: RE | Admit: 2023-09-06 | Discharge: 2023-09-06 | Disposition: A | Discharge status: Home / Self Care | Payer: Medicaid Other | Source: Ambulatory Visit | Attending: Cardiology | Admitting: Cardiology

## 2023-09-06 DIAGNOSIS — Z9889 Other specified postprocedural states: Secondary | ICD-10-CM | POA: Diagnosis not present

## 2023-09-06 DIAGNOSIS — Z952 Presence of prosthetic heart valve: Secondary | ICD-10-CM

## 2023-09-07 NOTE — Progress Notes (Signed)
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 Swallow whole., Disp: 30 tablet, Rfl: 12   cyclobenzaprine (FLEXERIL) 5 MG tablet, Take 5 mg by mouth 3 (three) times daily as needed for muscle spasms., Disp: , Rfl:    metoprolol succinate (TOPROL-XL) 25 MG 24 hr tablet, Take 0.5 tablets (12.5 mg total) by mouth daily with breakfast., Disp: 30 tablet, Rfl: 2   rosuvastatin (CRESTOR) 5 MG tablet, Take 1 tablet (5 mg total) by mouth daily. (Patient taking differently: Take 5 mg by mouth at bedtime.), Disp: 90 tablet, Rfl: 3   sildenafil (VIAGRA) 25 MG tablet, Take 100 mg by mouth daily as needed for erectile dysfunction., Disp: , Rfl:    warfarin (COUMADIN) 2.5 MG tablet, Take 2 tablets (5 mg total) by mouth daily. Or as directed by the [facility], Disp: 60 tablet, Rfl: 11  Past Medical History: Past Medical History:  Diagnosis Date   Aortic aneurysm without rupture (HCC)    Chest pain of uncertain etiology 08/11/2021   CKD (chronic kidney disease)    GERD (gastroesophageal reflux disease)    Heart failure (HCC)    Hypertension    Obesity (BMI 30-39.9) 08/11/2021   Sleep apnea     Tobacco Use: Social History   Tobacco Use  Smoking Status Never  Smokeless Tobacco Never     Labs: Review Flowsheet  More data exists      Latest Ref Rng & Units 07/14/2022 05/04/2023 05/06/2023 05/07/2023 06/02/2023  Labs for ITP Cardiac and Pulmonary Rehab  Cholestrol 100 - 199 mg/dL 409  - - - 811   LDL (calc) 0 - 99 mg/dL 914  - - - 59   HDL-C >78 mg/dL 57  - - - 61   Trlycerides 0 - 149 mg/dL 70  - - - 62   Hemoglobin A1c 4.8 - 5.6 % 5.2  5.4  - - -  PH, Arterial 7.35 - 7.45 - 7.4  7.328  7.299  7.330  7.339  7.377  7.366  7.391  7.329  7.333  -  PCO2 arterial 32 - 48 mmHg - 38  43.1  43.9  41.5  38.0  38.7  38.4  40.8  42.9  45.0  -  Bicarbonate 20.0 - 28.0 mmol/L - 23.5  22.4  21.3  21.8  20.8  22.8  22.0  24.8  22.6  23.7  -  TCO2 22 - 32 mmol/L - - 24  23  23  22  24  24  23  27  26  27  25  24  25   -  Acid-base deficit 0.0 - 2.0 mmol/L - 1.1  3.0  5.0  4.0  5.0  2.0  3.0  3.0  2.0  -  O2 Saturation % - 97.4  92  92  97  95  99  100  100  100  96  -    Details       Multiple values from one day are sorted in reverse-chronological order         Capillary Blood Glucose: Lab Results  Component Value Date   GLUCAP 126 (H) 07/04/2023   GLUCAP 88 05/12/2023   GLUCAP 133 (H) 05/11/2023   GLUCAP 113 (H) 05/11/2023   GLUCAP 114 (H) 05/10/2023     Exercise Target Goals: Exercise Program Goal: Individual exercise prescription set using results from initial 6 min walk test and THRR while considering  patient's activity barriers and safety.   Exercise Prescription Goal: Initial exercise prescription builds to 30-45 minutes a day of aerobic activity, 2-3 days per week.  Home exercise guidelines will be given to patient during program as part of exercise prescription that the participant will acknowledge.  Activity Barriers & Risk Stratification:  Activity Barriers & Cardiac Risk Stratification - 08/03/23 1040       Activity Barriers & Cardiac Risk Stratification   Activity Barriers Back Problems;Other (comment);Balance Concerns    Comments Right foot drop  (wears foot brace), chronic back pain, bilateral knee pain (needs knee replacement).    Cardiac Risk Stratification Low             6 Minute Walk:  6 Minute Walk     Row Name 08/03/23 1143         6 Minute Walk   Phase Initial     Distance 1161 feet     Walk Time 6 minutes     # of Rest Breaks 0     MPH 2.2     METS 3.42     RPE 9     Perceived Dyspnea  1     VO2 Peak 11.97     Symptoms Yes (comment)     Comments Mild shortness of breath. Back pain, chronic with walking, 3/10 on pain scale.     Resting HR 66 bpm     Resting BP 128/80     Resting Oxygen Saturation  100 %     Exercise Oxygen Saturation  during 6 min walk 99 %     Max Ex. HR 86 bpm     Max Ex. BP 132/76     2 Minute Post BP 110/80              Oxygen Initial Assessment:   Oxygen Re-Evaluation:   Oxygen Discharge (Final Oxygen Re-Evaluation):   Initial Exercise Prescription:  Initial Exercise Prescription - 08/03/23 1300       Date of Initial Exercise RX and Referring Provider   Date 08/03/23    Referring Provider Thomasene Ripple, DO    Expected Discharge Date 10/27/23      Recumbant Bike   Level 1    Watts 45    Minutes 15    METs 3.4      T5 Nustep   Level 2    SPM 85    Minutes 15    METs 3      Prescription Details   Frequency (times per week) 3    Duration Progress to 30 minutes of continuous aerobic without signs/symptoms of physical distress      Intensity   THRR 40-80% of Max Heartrate 68-135    Ratings of Perceived Exertion 11-13    Perceived Dyspnea 0-4      Progression  Progression Continue to progress workloads to maintain intensity without signs/symptoms of physical distress.      Resistance Training   Training Prescription Yes    Weight 5 lbs    Reps 10-15             Perform Capillary Blood Glucose checks as needed.  Exercise Prescription Changes:   Exercise Prescription Changes     Row Name 08/11/23 1030 08/25/23 1028 09/06/23 1025          Response to Exercise   Blood Pressure (Admit) 118/70 116/70 120/78     Blood Pressure (Exercise) 126/78 132/78 --     Blood Pressure (Exit) 118/72 114/74 118/70     Heart Rate (Admit) 76 bpm 70 bpm 76 bpm     Heart Rate (Exercise) 95 bpm 87 bpm 93 bpm     Heart Rate (Exit) 87 bpm 71 bpm 76 bpm     Rating of Perceived Exertion (Exercise) 11 12 12      Symptoms None None None     Comments Off to a good start with exercise. Increased workload on recumbent bike today. --     Duration Continue with 30 min of aerobic exercise without signs/symptoms of physical distress. Continue with 30 min of aerobic exercise without signs/symptoms of physical distress. Continue with 30 min of aerobic exercise without signs/symptoms of physical distress.     Intensity THRR unchanged THRR unchanged THRR unchanged       Progression   Progression Continue to progress workloads to maintain intensity without signs/symptoms of physical distress. Continue to progress workloads to maintain intensity without signs/symptoms of physical distress. Continue to progress workloads to maintain intensity without signs/symptoms of physical distress.     Average METs 2.2 2.4 2.4       Resistance Training   Training Prescription No  Relaxation day, no weights. No  Relaxation day, no weights. Yes     Weight -- -- 5 lbs     Reps -- -- 10-15     Time -- -- 10 Minutes       Interval Training   Interval Training No No No       Recumbant Bike   Level 2 4 3      RPM 65 53 54     Watts 20 35 30     Minutes 15 15 15      METs 2.2 2.8 2.6       T5 Nustep   Level 2 3 3      SPM 97 83 95     Minutes 15 15 15      METs 2.3 2 2.2       Home Exercise Plan   Plans to continue exercise at -- Home (comment)  Walking Home (comment)  Walking     Frequency -- Add 2 additional days to program exercise sessions. Add 2 additional days to program exercise sessions.     Initial Home Exercises Provided -- 08/18/23 08/18/23               Exercise Comments:   Exercise Comments     Row Name 08/11/23 1121 08/18/23 1044 08/27/23 1106       Exercise Comments Timothy Browning tolerated low intensity exercise well without symptoms. Increased workload on the recumbent bike from level 1 to 2. Oriented to stretching and exercise routine. Reviewed home exercise guidelines and goals with Timothy Browning. Increased workloads on Nustep and recumbent bike today. Reviewed METs with Timothy Browning. Decreased workload on recumbent bike from  4 to 3, he c/o knees burning at the higher workload.              Exercise Goals and Review:   Exercise Goals     Row Name 08/03/23 1040             Exercise Goals   Increase Physical Activity Yes       Intervention Provide advice, education, support and counseling about physical activity/exercise needs.;Develop an individualized exercise prescription for aerobic and resistive training based on initial evaluation findings, risk stratification, comorbidities and participant's personal goals.       Expected Outcomes Short Term: Attend rehab on a regular basis to increase amount of physical activity.;Long Term: Add in home exercise to make exercise part of routine and to increase amount of physical activity.;Long Term: Exercising regularly at least 3-5 days a week.       Increase Strength and Stamina Yes       Intervention Provide advice, education, support and counseling about physical activity/exercise needs.;Develop an individualized exercise prescription for aerobic and resistive training based on initial evaluation findings, risk stratification, comorbidities and participant's personal goals.       Expected Outcomes Short Term: Increase workloads from initial exercise prescription for resistance, speed, and METs.;Short Term: Perform resistance training exercises routinely during rehab and add in resistance training at home;Long Term: Improve cardiorespiratory fitness, muscular endurance and strength as measured by  increased METs and functional capacity ( )       Able to understand and use rate of perceived exertion (RPE) scale Yes       Intervention Provide education and explanation on how to use RPE scale       Expected Outcomes Short Term: Able to use RPE daily in rehab to express subjective intensity level;Long Term:  Able to use RPE to guide intensity level when exercising independently       Knowledge and understanding of Target Heart Rate Range (THRR) Yes       Intervention Provide education and explanation of THRR including how the numbers were predicted and where they are located for reference       Expected Outcomes Short Term: Able to state/look up THRR;Long Term: Able to use THRR to govern intensity when exercising independently;Short Term: Able to use daily as guideline for intensity in rehab       Able to check pulse independently Yes       Intervention Provide education and demonstration on how to check pulse in carotid and radial arteries.;Review the importance of being able to check your own pulse for safety during independent exercise       Expected Outcomes Short Term: Able to explain why pulse checking is important during independent exercise;Long Term: Able to check pulse independently and accurately       Understanding of Exercise Prescription Yes       Intervention Provide education, explanation, and written materials on patient's individual exercise prescription       Expected Outcomes Short Term: Able to explain program exercise prescription;Long Term: Able to explain home exercise prescription to exercise independently                Exercise Goals Re-Evaluation :  Exercise Goals Re-Evaluation     Row Name 08/11/23 1121 08/18/23 1044           Exercise Goal Re-Evaluation   Exercise Goals Review Increase Physical Activity;Increase Strength and Stamina;Able to understand and use rate of perceived exertion (RPE) scale;Knowledge and understanding  of Target Heart Rate Range  (THRR) Increase Physical Activity;Increase Strength and Stamina;Able to understand and use rate of perceived exertion (RPE) scale;Knowledge and understanding of Target Heart Rate Range (THRR);Understanding of Exercise Prescription      Comments Timothy Browning was able to understand and use RPE scale appropriately. Reviewed MET scale and THRR with him. Reviewed exercise prescription with Timothy Browning. Discussed increasing walking duration, and he is amenable to this. Progress workloads at cardiac rehab to help build confidence with exercise. Increased level on NuStep and Recumbent Bike today to level 3.0.      Expected Outcomes Progress workloads as tolerated to help increase cardiorespiratory fitness. Increase home walking duration 1-2 minutes each session with a goal of 30 minutes at least 2 days/week. Increase workloads as tolerated.               Discharge Exercise Prescription (Final Exercise Prescription Changes):  Exercise Prescription Changes - 09/06/23 1025       Response to Exercise   Blood Pressure (Admit) 120/78    Blood Pressure (Exit) 118/70    Heart Rate (Admit) 76 bpm    Heart Rate (Exercise) 93 bpm    Heart Rate (Exit) 76 bpm    Rating of Perceived Exertion (Exercise) 12    Symptoms None    Duration Continue with 30 min of aerobic exercise without signs/symptoms of physical distress.    Intensity THRR unchanged      Progression   Progression Continue to progress workloads to maintain intensity without signs/symptoms of physical distress.    Average METs 2.4      Resistance Training   Training Prescription Yes    Weight 5 lbs    Reps 10-15    Time 10 Minutes      Interval Training   Interval Training No      Recumbant Bike   Level 3    RPM 54    Watts 30    Minutes 15    METs 2.6      T5 Nustep   Level 3    SPM 95    Minutes 15    METs 2.2      Home Exercise Plan   Plans to continue exercise at Home (comment)   Walking   Frequency Add 2 additional days to program  exercise sessions.    Initial Home Exercises Provided 08/18/23             Nutrition:  Target Goals: Understanding of nutrition guidelines, daily intake of sodium 1500mg , cholesterol 200mg , calories 30% from fat and 7% or less from saturated fats, daily to have 5 or more servings of fruits and vegetables.  Biometrics:  Pre Biometrics - 08/03/23 1020       Pre Biometrics   Waist Circumference 40.25 inches    Hip Circumference 43.5 inches    Waist to Hip Ratio 0.93 %    Triceps Skinfold 13 mm    % Body Fat 27.2 %    Grip Strength 54 kg    Flexibility 0 in    Single Leg Stand 24.37 seconds              Nutrition Therapy Plan and Nutrition Goals:  Nutrition Therapy & Goals - 09/06/23 1112       Nutrition Therapy   Diet Heart Healthy Diet    Drug/Food Interactions Statins/Certain Fruits;Coumadin/Vit K      Personal Nutrition Goals   Nutrition Goal Patient to identify strategies for reducing cardiovascular risk by  attending the [employer] education and nutrition series weekly.   Goal in action.   Personal Goal #2 Patient to improve diet quality by using the plate method as a guide for meal planning to include lean protein/plant protein, fruits, vegetables, whole grains, nonfat dairy as part of a well-balanced diet.   goal in action.   Personal Goal #3 Patient to limit sodium to 1500mg  per day   goal in action.   Comments Goals in action. Patient has medical history of aortic aneurysm repair, HTN, CKD, Afib, obesity, OSA, CHF. He continues to attend the [facility] education and nutrition series. He has started making many dietary changes including reduced saturated fat intake, increased dietary fiber, and reading food labels for sodium. He continues regular follow-up with the anti-coagulation clinic. His lipids remain well controlled. His family remains a good support for lifestyle changes. Patient will benefit from participation in intensive cardiac rehab for nutrition,  exercise, and lifestyle modification.      Intervention Plan   Intervention Prescribe, educate and counsel regarding individualized specific dietary modifications aiming towards targeted core components such as weight, hypertension, lipid management, diabetes, heart failure and other comorbidities.;Nutrition handout(s) given to patient.    Expected Outcomes Long Term Goal: Adherence to prescribed nutrition plan.;Short Term Goal: Understand basic principles of dietary content, such as calories, fat, sodium, cholesterol and nutrients.             Nutrition Assessments:  Nutrition Assessments - 08/12/23 1040       Rate Your Plate Scores   Pre Score 44            MEDIFICTS Score Key: >=70 Need to make dietary changes  40-70 Heart Healthy Diet <= 40 Therapeutic Level Cholesterol Diet   Flowsheet Row INTENSIVE CARDIAC REHAB from 08/11/2023 in [facility] Heart, Vascular, & Lung Health  Picture Your Plate Total Score on Admission 44      Picture Your Plate Scores: <86 Unhealthy dietary pattern with much room for improvement. 41-50 Dietary pattern unlikely to meet recommendations for good health and room for improvement. 51-60 More healthful dietary pattern, with some room for improvement.  >60 Healthy dietary pattern, although there may be some specific behaviors that could be improved.    Nutrition Goals Re-Evaluation:  Nutrition Goals Re-Evaluation     Row Name 08/11/23 1619 09/06/23 1112           Goals   Current Weight 228 lb 6.3 oz (103.6 kg) 231 lb 14.8 oz (105.2 kg)      Comment Cr 1.37, lipids WNL, LDL 59, A1c WNL no new labs; most recent labs  Cr 1.37, lipids WNL, LDL 59, A1c WNL. He continues follow-up with anti-coag clinic.      Expected Outcome Patient has medical history of aortic aneurysm repair, HTN, CKD, Afib, obesity, OSA, CHF. He continues regular follow-up with the anti-coagulation clinic. His lipids remain well controlled.  Patient will benefit from participation in intensive cardiac rehab for nutrition, exercise, and lifestyle modification. Goals in action. Patient has medical history of aortic aneurysm repair, HTN, CKD, Afib, obesity, OSA, CHF. He continues to attend the [facility] education and nutrition series. He has started making many dietary changes including reduced saturated fat intake, increased dietary fiber, and reading food labels for sodium. He continues regular follow-up with the anti-coagulation clinic. His lipids remain well controlled. His family remains a good support for lifestyle changes. Patient will benefit from participation in intensive cardiac rehab for nutrition, exercise,  and lifestyle modification.               Nutrition Goals Re-Evaluation:  Nutrition Goals Re-Evaluation     Row Name 08/11/23 1619 09/06/23 1112           Goals   Current Weight 228 lb 6.3 oz (103.6 kg) 231 lb 14.8 oz (105.2 kg)      Comment Cr 1.37, lipids WNL, LDL 59, A1c WNL no new labs; most recent labs  Cr 1.37, lipids WNL, LDL 59, A1c WNL. He continues follow-up with anti-coag clinic.      Expected Outcome Patient has medical history of aortic aneurysm repair, HTN, CKD, Afib, obesity, OSA, CHF. He continues regular follow-up with the anti-coagulation clinic. His lipids remain well controlled. Patient will benefit from participation in intensive cardiac rehab for nutrition, exercise, and lifestyle modification. Goals in action. Patient has medical history of aortic aneurysm repair, HTN, CKD, Afib, obesity, OSA, CHF. He continues to attend the [facility] education and nutrition series. He has started making many dietary changes including reduced saturated fat intake, increased dietary fiber, and reading food labels for sodium. He continues regular follow-up with the anti-coagulation clinic. His lipids remain well controlled. His family remains a good support for lifestyle changes. Patient will benefit from participation  in intensive cardiac rehab for nutrition, exercise, and lifestyle modification.               Nutrition Goals Discharge (Final Nutrition Goals Re-Evaluation):  Nutrition Goals Re-Evaluation - 09/06/23 1112       Goals   Current Weight 231 lb 14.8 oz (105.2 kg)    Comment no new labs; most recent labs  Cr 1.37, lipids WNL, LDL 59, A1c WNL. He continues follow-up with anti-coag clinic.    Expected Outcome Goals in action. Patient has medical history of aortic aneurysm repair, HTN, CKD, Afib, obesity, OSA, CHF. He continues to attend the [facility] education and nutrition series. He has started making many dietary changes including reduced saturated fat intake, increased dietary fiber, and reading food labels for sodium. He continues regular follow-up with the anti-coagulation clinic. His lipids remain well controlled. His family remains a good support for lifestyle changes. Patient will benefit from participation in intensive cardiac rehab for nutrition, exercise, and lifestyle modification.             Psychosocial: Target Goals: Acknowledge presence or absence of significant depression and/or stress, maximize coping skills, provide positive support system. Participant is able to verbalize types and ability to use techniques and skills needed for reducing stress and depression.  Initial Review & Psychosocial Screening:  Initial Psych Review & Screening - 08/03/23 1101       Initial Review   Current issues with Current Stress Concerns    Source of Stress Concerns Unable to participate in former interests or hobbies    Comments Patient started therapy with a counselor at [facility] on 07/29/23.      Family Dynamics   Good Support System? Yes    Comments Patient doesn't talk to friends or family when he's stressed but recently started talking to a counselor.      Barriers   Psychosocial barriers to participate in program The patient should benefit from training in stress management  and relaxation.;Psychosocial barriers identified (see note)      Screening Interventions   Interventions Encouraged to exercise;Provide feedback about the scores to participant;To provide support and resources with identified psychosocial needs    Expected Outcomes Short Term  goal: Utilizing psychosocial counselor, staff and physician to assist with identification of specific Stressors or current issues interfering with healing process. Setting desired goal for each stressor or current issue identified.;Long Term Goal: Stressors or current issues are controlled or eliminated.;Short Term goal: Identification and review with participant of any Quality of Life or Depression concerns found by scoring the questionnaire.;Long Term goal: The participant improves quality of Life and PHQ9 Scores as seen by post scores and/or verbalization of changes             Quality of Life Scores:  Quality of Life - 08/03/23 1107       Quality of Life   Select Quality of Life      Quality of Life Scores   Health/Function Pre 16.1 %    Socioeconomic Pre 20.75 %    Psych/Spiritual Pre 17 %    Family Pre 26.4 %    GLOBAL Pre 18.75 %            Scores of 19 and below usually indicate a poorer quality of life in these areas.  A difference of  2-3 points is a clinically meaningful difference.  A difference of 2-3 points in the total score of the Quality of Life Index has been associated with significant improvement in overall quality of life, self-image, physical symptoms, and general health in studies assessing change in quality of life.  PHQ-9: Review Flowsheet  More data exists      08/11/2023 08/03/2023 06/02/2023 04/20/2023 03/25/2023  Depression screen PHQ 2/9  Decreased Interest 1 2 0 0 0  Down, Depressed, Hopeless 1 2 0 0 0  PHQ - 2 Score 2 4 0 0 0  Altered sleeping 2 1 0 1 -  Tired, decreased energy 2 2 0 1 -  Change in appetite 0 0 0 0 -  Feeling bad or failure about yourself  2 1 0 0 -   Trouble concentrating 1 0 - - -  Moving slowly or fidgety/restless 0 2 0 0 -  Suicidal thoughts 0 0 0 0 -  PHQ-9 Score 9 10 0 2 -  Difficult doing work/chores Somewhat difficult Somewhat difficult Not difficult at all Not difficult at all -    Details           Interpretation of Total Score  Total Score Depression Severity:  1-4 = Minimal depression, 5-9 = Mild depression, 10-14 = Moderate depression, 15-19 = Moderately severe depression, 20-27 = Severe depression   Psychosocial Evaluation and Intervention:   Psychosocial Re-Evaluation:  Psychosocial Re-Evaluation     Row Name 08/11/23 1159 09/02/23 1320           Psychosocial Re-Evaluation   Current issues with Current Stress Concerns Current Stress Concerns      Comments Timothy Browning did not voice any concerns or stressors on his first day of exercise, Timothy Browning has not voiced any  concerns or stressors  during exercise at cardiac rehab. Timothy Browning says he feels a little stronger since participating in the program      Expected Outcomes -- Timothy Browning will have decreased or controlled stress upon completion of cardiac rehab      Interventions Stress management education;Encouraged to attend Cardiac Rehabilitation for the exercise;Relaxation education Stress management education;Encouraged to attend Cardiac Rehabilitation for the exercise;Relaxation education      Continue Psychosocial Services  Follow up required by staff Follow up required by staff        Initial Review   Source of  Stress Concerns Unable to perform yard/household activities;Unable to participate in former interests or hobbies Unable to perform yard/household activities;Unable to participate in former interests or hobbies      Comments will continue to monitor  and offer support as needed will continue to monitor  and offer support as needed               Psychosocial Discharge (Final Psychosocial Re-Evaluation):  Psychosocial Re-Evaluation - 09/02/23 1320        Psychosocial Re-Evaluation   Current issues with Current Stress Concerns    Comments Timothy Browning has not voiced any  concerns or stressors  during exercise at cardiac rehab. Timothy Browning says he feels a little stronger since participating in the program    Expected Outcomes Timothy Browning will have decreased or controlled stress upon completion of cardiac rehab    Interventions Stress management education;Encouraged to attend Cardiac Rehabilitation for the exercise;Relaxation education    Continue Psychosocial Services  Follow up required by staff      Initial Review   Source of Stress Concerns Unable to perform yard/household activities;Unable to participate in former interests or hobbies    Comments will continue to monitor  and offer support as needed             Vocational Rehabilitation: Provide vocational rehab assistance to qualifying candidates.   Vocational Rehab Evaluation & Intervention:  Vocational Rehab - 08/03/23 1127       Initial Vocational Rehab Evaluation & Intervention   Assessment shows need for Vocational Rehabilitation Yes    Vocational Rehab Packet given to patient 08/03/23      Vocational Rehab Re-Evaulation   Comments Anticipating knee surgery but may be interested in vocational rehab services in the future.             Education: Education Goals: Education classes will be provided on a weekly basis, covering required topics. Participant will state understanding/return demonstration of topics presented.    Education     Row Name 08/11/23 1500     Education   Cardiac Education Topics Pritikin   Customer service manager   Weekly Topic International Cuisine- Spotlight on the [employer] Zones   Instruction Review Code 1- Verbalizes Understanding   Class Start Time 1145   Class Stop Time 1221   Class Time Calculation (min) 36 min    Row Name 08/25/23 1500     Education   Cardiac Education Topics Pritikin   Teacher, music   Weekly Topic Powerhouse Plant-Based Proteins   Instruction Review Code 1- Verbalizes Understanding   Class Start Time 1145   Class Stop Time 1222   Class Time Calculation (min) 37 min    Row Name 09/01/23 1300     Education   Cardiac Education Topics Pritikin   Customer service manager   Weekly Topic Adding Flavor - Sodium-Free   Instruction Review Code 1- Verbalizes Understanding   Class Start Time 1140   Class Stop Time 1220   Class Time Calculation (min) 40 min            Core Videos: Exercise    Move It!  Clinical staff conducted group or individual video education with verbal and written material and guidebook.  Patient learns the recommended Pritikin exercise program. Exercise with the goal of living a long, healthy  life. Some of the health benefits of exercise include controlled diabetes, healthier blood pressure levels, improved cholesterol levels, improved heart and lung capacity, improved sleep, and better body composition. Everyone should speak with their doctor before starting or changing an exercise routine.  Biomechanical Limitations Clinical staff conducted group or individual video education with verbal and written material and guidebook.  Patient learns how biomechanical limitations can impact exercise and how we can mitigate and possibly overcome limitations to have an impactful and balanced exercise routine.  Body Composition Clinical staff conducted group or individual video education with verbal and written material and guidebook.  Patient learns that body composition (ratio of muscle mass to fat mass) is a key component to assessing overall fitness, rather than body weight alone. Increased fat mass, especially visceral belly fat, can put [place] at increased risk for metabolic syndrome, type 2 diabetes, heart disease, and even death. It is recommended to combine diet and exercise  (cardiovascular and resistance training) to improve your body composition. Seek guidance from your physician and exercise physiologist before implementing an exercise routine.  Exercise Action Plan Clinical staff conducted group or individual video education with verbal and written material and guidebook.  Patient learns the recommended strategies to achieve and enjoy long-term exercise adherence, including variety, self-motivation, self-efficacy, and positive decision making. Benefits of exercise include fitness, good health, weight management, more energy, better sleep, less stress, and overall well-being.  Medical   Heart Disease Risk Reduction Clinical staff conducted group or individual video education with verbal and written material and guidebook.  Patient learns our heart is our most vital organ as it circulates oxygen, nutrients, white blood cells, and hormones throughout the entire body, and carries waste away. Data supports a plant-based eating plan like the Pritikin Program for its effectiveness in slowing progression of and reversing heart disease. The video provides a number of recommendations to address heart disease.   Metabolic Syndrome and Belly Fat  Clinical staff conducted group or individual video education with verbal and written material and guidebook.  Patient learns what metabolic syndrome is, how it leads to heart disease, and how one can reverse it and keep it from coming back. You have metabolic syndrome if you have 3 of the following 5 criteria: abdominal obesity, high blood pressure, high triglycerides, low HDL cholesterol, and high blood sugar.  Hypertension and Heart Disease Clinical staff conducted group or individual video education with verbal and written material and guidebook.  Patient learns that high blood pressure, or hypertension, is very common in the [place]. Hypertension is largely due to excessive salt intake, but other important risk factors  include being overweight, physical inactivity, drinking too much alcohol, smoking, and not eating enough potassium from fruits and vegetables. High blood pressure is a leading risk factor for heart attack, stroke, congestive heart failure, dementia, kidney failure, and premature death. Long-term effects of excessive salt intake include stiffening of the arteries and thickening of heart muscle and organ damage. Recommendations include ways to reduce hypertension and the risk of heart disease.  Diseases of Our Time - Focusing on Diabetes Clinical staff conducted group or individual video education with verbal and written material and guidebook.  Patient learns why the best way to stop diseases of our time is prevention, through food and other lifestyle changes. Medicine (such as prescription pills and surgeries) is often only a Band-Aid on the problem, not a long-term solution. Most common diseases of our time include obesity, type 2 diabetes, hypertension, heart disease,  and cancer. The Pritikin Program is recommended and has been proven to help reduce, reverse, and/or prevent the damaging effects of metabolic syndrome.  Nutrition   Overview of the Pritikin Eating Plan  Clinical staff conducted group or individual video education with verbal and written material and guidebook.  Patient learns about the Pritikin Eating Plan for disease risk reduction. The Pritikin Eating Plan emphasizes a wide variety of unrefined, minimally-processed carbohydrates, like fruits, vegetables, whole grains, and legumes. Go, Caution, and Stop food choices are explained. Plant-based and lean animal proteins are emphasized. Rationale provided for low sodium intake for blood pressure control, low added sugars for blood sugar stabilization, and low added fats and oils for coronary artery disease risk reduction and weight management.  Calorie Density  Clinical staff conducted group or individual video education with verbal and  written material and guidebook.  Patient learns about calorie density and how it impacts the Pritikin Eating Plan. Knowing the characteristics of the food you choose will help you decide whether those foods will lead to weight gain or weight loss, and whether you want to consume more or less of them. Weight loss is usually a side effect of the Pritikin Eating Plan because of its focus on low calorie-dense foods.  Label Reading  Clinical staff conducted group or individual video education with verbal and written material and guidebook.  Patient learns about the Pritikin recommended label reading guidelines and corresponding recommendations regarding calorie density, added sugars, sodium content, and whole grains.  Dining Out - Part 1  Clinical staff conducted group or individual video education with verbal and written material and guidebook.  Patient learns that restaurant meals can be sabotaging because they can be so high in calories, fat, sodium, and/or sugar. Patient learns recommended strategies on how to positively address this and avoid unhealthy pitfalls.  Facts on Fats  Clinical staff conducted group or individual video education with verbal and written material and guidebook.  Patient learns that lifestyle modifications can be just as effective, if not more so, as many medications for lowering your risk of heart disease. A Pritikin lifestyle can help to reduce your risk of inflammation and atherosclerosis (cholesterol build-up, or plaque, in the artery walls). Lifestyle interventions such as dietary choices and physical activity address the cause of atherosclerosis. A review of the types of fats and their impact on blood cholesterol levels, along with dietary recommendations to reduce fat intake is also included.  Nutrition Action Plan  Clinical staff conducted group or individual video education with verbal and written material and guidebook.  Patient learns how to incorporate Pritikin  recommendations into their lifestyle. Recommendations include planning and keeping personal health goals in mind as an important part of their success.  Healthy Mind-Set    Healthy Minds, Bodies, Hearts  Clinical staff conducted group or individual video education with verbal and written material and guidebook.  Patient learns how to identify when they are stressed. Video will discuss the impact of that stress, as well as the many benefits of stress management. Patient will also be introduced to stress management techniques. The way we think, act, and feel has an impact on our hearts.  How Our Thoughts Can Heal Our Hearts  Clinical staff conducted group or individual video education with verbal and written material and guidebook.  Patient learns that negative thoughts can cause depression and anxiety. This can result in negative lifestyle behavior and serious health problems. Cognitive behavioral therapy is an effective method to help control our  thoughts in order to change and improve our emotional outlook.  Additional Videos:  Exercise    Improving Performance  Clinical staff conducted group or individual video education with verbal and written material and guidebook.  Patient learns to use a non-linear approach by alternating intensity levels and lengths of time spent exercising to help burn more calories and lose more body fat. Cardiovascular exercise helps improve heart health, metabolism, hormonal balance, blood sugar control, and recovery from fatigue. Resistance training improves strength, endurance, balance, coordination, reaction time, metabolism, and muscle mass. Flexibility exercise improves circulation, posture, and balance. Seek guidance from your physician and exercise physiologist before implementing an exercise routine and learn your capabilities and proper form for all exercise.  Introduction to Yoga  Clinical staff conducted group or individual video education with verbal and  written material and guidebook.  Patient learns about yoga, a discipline of the coming together of mind, breath, and body. The benefits of yoga include improved flexibility, improved range of motion, better posture and core strength, increased lung function, weight loss, and positive self-image. Yoga's heart health benefits include lowered blood pressure, healthier heart rate, decreased cholesterol and triglyceride levels, improved immune function, and reduced stress. Seek guidance from your physician and exercise physiologist before implementing an exercise routine and learn your capabilities and proper form for all exercise.  Medical   Aging: Enhancing Your Quality of Life  Clinical staff conducted group or individual video education with verbal and written material and guidebook.  Patient learns key strategies and recommendations to stay in good physical health and enhance quality of life, such as prevention strategies, having an advocate, securing a Health Care Proxy and Power of Attorney, and keeping a list of medications and system for tracking them. It also discusses how to avoid risk for bone loss.  Biology of Weight Control  Clinical staff conducted group or individual video education with verbal and written material and guidebook.  Patient learns that weight gain occurs because we consume more calories than we burn (eating more, moving less). Even if your body weight is normal, you may have higher ratios of fat compared to muscle mass. Too much body fat puts you at increased risk for cardiovascular disease, heart attack, stroke, type 2 diabetes, and obesity-related cancers. In addition to exercise, following the Pritikin Eating Plan can help reduce your risk.  Decoding Lab Results  Clinical staff conducted group or individual video education with verbal and written material and guidebook.  Patient learns that lab test reflects one measurement whose values change over time and are influenced  by many factors, including medication, stress, sleep, exercise, food, hydration, pre-existing medical conditions, and more. It is recommended to use the knowledge from this video to become more involved with your lab results and evaluate your numbers to speak with your doctor.   Diseases of Our Time - Overview  Clinical staff conducted group or individual video education with verbal and written material and guidebook.  Patient learns that according to the CDC, 50% to 70% of chronic diseases (such as obesity, type 2 diabetes, elevated lipids, hypertension, and heart disease) are avoidable through lifestyle improvements including healthier food choices, listening to satiety cues, and increased physical activity.  Sleep Disorders Clinical staff conducted group or individual video education with verbal and written material and guidebook.  Patient learns how good quality and duration of sleep are important to overall health and well-being. Patient also learns about sleep disorders and how they impact health along with recommendations to  address them, including discussing with a physician.  Nutrition  Dining Out - Part 2 Clinical staff conducted group or individual video education with verbal and written material and guidebook.  Patient learns how to plan ahead and communicate in order to maximize their dining experience in a healthy and nutritious manner. Included are recommended food choices based on the type of restaurant the patient is visiting.   Fueling a Banker conducted group or individual video education with verbal and written material and guidebook.  There is a strong connection between our food choices and our health. Diseases like obesity and type 2 diabetes are very prevalent and are in large-part due to lifestyle choices. The Pritikin Eating Plan provides plenty of food and hunger-curbing satisfaction. It is easy to follow, affordable, and helps reduce health  risks.  Menu Workshop  Clinical staff conducted group or individual video education with verbal and written material and guidebook.  Patient learns that restaurant meals can sabotage health goals because they are often packed with calories, fat, sodium, and sugar. Recommendations include strategies to plan ahead and to communicate with the manager, chef, or server to help order a healthier meal.  Planning Your Eating Strategy  Clinical staff conducted group or individual video education with verbal and written material and guidebook.  Patient learns about the Pritikin Eating Plan and its benefit of reducing the risk of disease. The Pritikin Eating Plan does not focus on calories. Instead, it emphasizes high-quality, nutrient-rich foods. By knowing the characteristics of the foods, we choose, we can determine their calorie density and make informed decisions.  Targeting Your Nutrition Priorities  Clinical staff conducted group or individual video education with verbal and written material and guidebook.  Patient learns that lifestyle habits have a tremendous impact on disease risk and progression. This video provides eating and physical activity recommendations based on your personal health goals, such as reducing LDL cholesterol, losing weight, preventing or controlling type 2 diabetes, and reducing high blood pressure.  Vitamins and Minerals  Clinical staff conducted group or individual video education with verbal and written material and guidebook.  Patient learns different ways to obtain key vitamins and minerals, including through a recommended healthy diet. It is important to discuss all supplements you take with your doctor.   Healthy Mind-Set    Smoking Cessation  Clinical staff conducted group or individual video education with verbal and written material and guidebook.  Patient learns that cigarette smoking and tobacco addiction pose a serious health risk which affects millions of  people. Stopping smoking will significantly reduce the risk of heart disease, lung disease, and many forms of cancer. Recommended strategies for quitting are covered, including working with your doctor to develop a successful plan.  Culinary   Becoming a Set designer conducted group or individual video education with verbal and written material and guidebook.  Patient learns that cooking at home can be healthy, cost-effective, quick, and puts them in control. Keys to cooking healthy recipes will include looking at your recipe, assessing your equipment needs, planning ahead, making it simple, choosing cost-effective seasonal ingredients, and limiting the use of added fats, salts, and sugars.  Cooking - Breakfast and Snacks  Clinical staff conducted group or individual video education with verbal and written material and guidebook.  Patient learns how important breakfast is to satiety and nutrition through the entire day. Recommendations include key foods to eat during breakfast to help stabilize blood sugar levels and to prevent  overeating at meals later in the day. Planning ahead is also a key component.  Cooking - Educational psychologist conducted group or individual video education with verbal and written material and guidebook.  Patient learns eating strategies to improve overall health, including an approach to cook more at home. Recommendations include thinking of animal protein as a side on your plate rather than center stage and focusing instead on lower calorie dense options like vegetables, fruits, whole grains, and plant-based proteins, such as beans. Making sauces in large quantities to freeze for later and leaving the skin on your vegetables are also recommended to maximize your experience.  Cooking - Healthy Salads and Dressing Clinical staff conducted group or individual video education with verbal and written material and guidebook.  Patient learns that  vegetables, fruits, whole grains, and legumes are the foundations of the Pritikin Eating Plan. Recommendations include how to incorporate each of these in flavorful and healthy salads, and how to create homemade salad dressings. Proper handling of ingredients is also covered. Cooking - Soups and [employer] - Soups and Desserts Clinical staff conducted group or individual video education with verbal and written material and guidebook.  Patient learns that Pritikin soups and desserts make for easy, nutritious, and delicious snacks and meal components that are low in sodium, fat, sugar, and calorie density, while high in vitamins, minerals, and filling fiber. Recommendations include simple and healthy ideas for soups and desserts.   Overview     The Pritikin Solution Program Overview Clinical staff conducted group or individual video education with verbal and written material and guidebook.  Patient learns that the results of the Pritikin Program have been documented in more than 100 articles published in peer-reviewed journals, and the benefits include reducing risk factors for (and, in some cases, even reversing) high cholesterol, high blood pressure, type 2 diabetes, obesity, and more! An overview of the three key pillars of the Pritikin Program will be covered: eating well, doing regular exercise, and having a healthy mind-set.  WORKSHOPS  Exercise: Exercise Basics: Building Your Action Plan Clinical staff led group instruction and group discussion with PowerPoint presentation and patient guidebook. To enhance the learning environment the use of posters, models and videos may be added. At the conclusion of this workshop, patients will comprehend the difference between physical activity and exercise, as well as the benefits of incorporating both, into their routine. Patients will understand the FITT (Frequency, Intensity, Time, and Type) principle and how to use it to build an exercise action  plan. In addition, safety concerns and other considerations for exercise and cardiac rehab will be addressed by the presenter. The purpose of this lesson is to promote a comprehensive and effective weekly exercise routine in order to improve patients' overall level of fitness.   Managing Heart Disease: Your Path to a Healthier Heart Clinical staff led group instruction and group discussion with PowerPoint presentation and patient guidebook. To enhance the learning environment the use of posters, models and videos may be added.At the conclusion of this workshop, patients will understand the anatomy and physiology of the heart. Additionally, they will understand how Pritikin's three pillars impact the risk factors, the progression, and the management of heart disease.  The purpose of this lesson is to provide a high-level overview of the heart, heart disease, and how the Pritikin lifestyle positively impacts risk factors.  Exercise Biomechanics Clinical staff led group instruction and group discussion with PowerPoint presentation and patient guidebook. To enhance  the learning environment the use of posters, models and videos may be added. Patients will learn how the structural parts of their bodies function and how these functions impact their daily activities, movement, and exercise. Patients will learn how to promote a neutral spine, learn how to manage pain, and identify ways to improve their physical movement in order to promote healthy living. The purpose of this lesson is to expose patients to common physical limitations that impact physical activity. Participants will learn practical ways to adapt and manage aches and pains, and to minimize their effect on regular exercise. Patients will learn how to maintain good posture while sitting, walking, and lifting.  Balance Training and Fall Prevention  Clinical staff led group instruction and group discussion with PowerPoint presentation and  patient guidebook. To enhance the learning environment the use of posters, models and videos may be added. At the conclusion of this workshop, patients will understand the importance of their sensorimotor skills (vision, proprioception, and the vestibular system) in maintaining their ability to balance as they age. Patients will apply a variety of balancing exercises that are appropriate for their current level of function. Patients will understand the common causes for poor balance, possible solutions to these problems, and ways to modify their physical environment in order to minimize their fall risk. The purpose of this lesson is to teach patients about the importance of maintaining balance as they age and ways to minimize their risk of falling.  WORKSHOPS   Nutrition:  Fueling a Ship broker led group instruction and group discussion with PowerPoint presentation and patient guidebook. To enhance the learning environment the use of posters, models and videos may be added. Patients will review the foundational principles of the Pritikin Eating Plan and understand what constitutes a serving size in each of the food groups. Patients will also learn Pritikin-friendly foods that are better choices when away from home and review make-ahead meal and snack options. Calorie density will be reviewed and applied to three nutrition priorities: weight maintenance, weight loss, and weight gain. The purpose of this lesson is to reinforce (in a group setting) the key concepts around what patients are recommended to eat and how to apply these guidelines when away from home by planning and selecting Pritikin-friendly options. Patients will understand how calorie density may be adjusted for different weight management goals.  Mindful Eating  Clinical staff led group instruction and group discussion with PowerPoint presentation and patient guidebook. To enhance the learning environment the use of posters,  models and videos may be added. Patients will briefly review the concepts of the Pritikin Eating Plan and the importance of low-calorie dense foods. The concept of mindful eating will be introduced as well as the importance of paying attention to internal hunger signals. Triggers for non-hunger eating and techniques for dealing with triggers will be explored. The purpose of this lesson is to provide patients with the opportunity to review the basic principles of the Pritikin Eating Plan, discuss the value of eating mindfully and how to measure internal cues of hunger and fullness using the Hunger Scale. Patients will also discuss reasons for non-hunger eating and learn strategies to use for controlling emotional eating.  Targeting Your Nutrition Priorities Clinical staff led group instruction and group discussion with PowerPoint presentation and patient guidebook. To enhance the learning environment the use of posters, models and videos may be added. Patients will learn how to determine their genetic susceptibility to disease by reviewing their family history. Patients  will gain insight into the importance of diet as part of an overall healthy lifestyle in mitigating the impact of genetics and other environmental insults. The purpose of this lesson is to provide patients with the opportunity to assess their personal nutrition priorities by looking at their family history, their own health history and current risk factors. Patients will also be able to discuss ways of prioritizing and modifying the Pritikin Eating Plan for their highest risk areas  Menu  Clinical staff led group instruction and group discussion with PowerPoint presentation and patient guidebook. To enhance the learning environment the use of posters, models and videos may be added. Using menus brought in from [employer], or printed from [employer], patients will apply the Pritikin dining out guidelines that were presented in the  [employer] video. Patients will also be able to practice these guidelines in a variety of provided scenarios. The purpose of this lesson is to provide patients with the opportunity to practice hands-on learning of the Pritikin Dining Out guidelines with actual menus and practice scenarios.  Label Reading Clinical staff led group instruction and group discussion with PowerPoint presentation and patient guidebook. To enhance the learning environment the use of posters, models and videos may be added. Patients will review and discuss the Pritikin label reading guidelines presented in Pritikin's Label Reading Educational series video. Using fool labels brought in from local grocery stores and markets, patients will apply the label reading guidelines and determine if the packaged food meet the Pritikin guidelines. The purpose of this lesson is to provide patients with the opportunity to review, discuss, and practice hands-on learning of the Pritikin Label Reading guidelines with actual packaged food labels. Cooking School  Pritikin's [employer] are designed to teach patients ways to prepare quick, simple, and affordable recipes at home. The importance of nutrition's role in chronic disease risk reduction is reflected in its emphasis in the overall Pritikin program. By learning how to prepare essential core Pritikin Eating Plan recipes, patients will increase control over what they eat; be able to customize the flavor of foods without the use of added salt, sugar, or fat; and improve the quality of the food they consume. By learning a set of core recipes which are easily assembled, quickly prepared, and affordable, patients are more likely to prepare more healthy foods at home. These workshops focus on convenient breakfasts, simple entres, side dishes, and desserts which can be prepared with minimal effort and are consistent with nutrition recommendations for cardiovascular risk  reduction. Cooking [employer] are taught by a Armed forces logistics/support/administrative officer (RD) who has been trained by the [employer]. The chef or RD has a clear understanding of the importance of minimizing - if not completely eliminating - added fat, sugar, and sodium in recipes. Throughout the series of Cooking School Workshop sessions, patients will learn about healthy ingredients and efficient methods of cooking to build confidence in their capability to prepare    Cooking School weekly topics:  Adding Flavor- Sodium-Free  Fast and Healthy Breakfasts  Powerhouse Plant-Based Proteins  Satisfying Salads and Dressings  Simple Sides and Sauces  International Cuisine-Spotlight on the [employer] Zones  Delicious Desserts  Savory Soups  [employer] - Meals in a Astronomer Appetizers and Snacks  Comforting Weekend Breakfasts  One-Pot Wonders   Fast Evening Meals  Landscape architect Your Pritikin Plate  WORKSHOPS   Healthy Mindset (Psychosocial):  Focused Goals, Sustainable Changes Clinical staff  led group instruction and group discussion with PowerPoint presentation and patient guidebook. To enhance the learning environment the use of posters, models and videos may be added. Patients will be able to apply effective goal setting strategies to establish at least one personal goal, and then take consistent, meaningful action toward that goal. They will learn to identify common barriers to achieving personal goals and develop strategies to overcome them. Patients will also gain an understanding of how our mind-set can impact our ability to achieve goals and the importance of cultivating a positive and growth-oriented mind-set. The purpose of this lesson is to provide patients with a deeper understanding of how to set and achieve personal goals, as well as the tools and strategies needed to overcome common obstacles which may arise along the way.  From Head to Heart: The  Power of a Healthy Outlook  Clinical staff led group instruction and group discussion with PowerPoint presentation and patient guidebook. To enhance the learning environment the use of posters, models and videos may be added. Patients will be able to recognize and describe the impact of emotions and mood on physical health. They will discover the importance of self-care and explore self-care practices which may work for them. Patients will also learn how to utilize the 4 C's to cultivate a healthier outlook and better manage stress and challenges. The purpose of this lesson is to demonstrate to patients how a healthy outlook is an essential part of maintaining good health, especially as they continue their cardiac rehab journey.  Healthy Sleep for a Healthy Heart Clinical staff led group instruction and group discussion with PowerPoint presentation and patient guidebook. To enhance the learning environment the use of posters, models and videos may be added. At the conclusion of this workshop, patients will be able to demonstrate knowledge of the importance of sleep to overall health, well-being, and quality of life. They will understand the symptoms of, and treatments for, common sleep disorders. Patients will also be able to identify daytime and nighttime behaviors which impact sleep, and they will be able to apply these tools to help manage sleep-related challenges. The purpose of this lesson is to provide patients with a general overview of sleep and outline the importance of quality sleep. Patients will learn about a few of the most common sleep disorders. Patients will also be introduced to the concept of "sleep hygiene," and discover ways to self-manage certain sleeping problems through simple daily behavior changes. Finally, the workshop will motivate patients by clarifying the links between quality sleep and their goals of heart-healthy living.   Recognizing and Reducing Stress Clinical staff led  group instruction and group discussion with PowerPoint presentation and patient guidebook. To enhance the learning environment the use of posters, models and videos may be added. At the conclusion of this workshop, patients will be able to understand the types of stress reactions, differentiate between acute and chronic stress, and recognize the impact that chronic stress has on their health. They will also be able to apply different coping mechanisms, such as reframing negative self-talk. Patients will have the opportunity to practice a variety of stress management techniques, such as deep abdominal breathing, progressive muscle relaxation, and/or guided imagery.  The purpose of this lesson is to educate patients on the role of stress in their lives and to provide healthy techniques for coping with it.  Learning Barriers/Preferences:  Learning Barriers/Preferences - 08/03/23 1103       Learning Barriers/Preferences   Learning Barriers None  Learning Preferences Audio;Written Material;Pictoral             Education Topics:  Knowledge Questionnaire Score:  Knowledge Questionnaire Score - 08/03/23 1118       Knowledge Questionnaire Score   Pre Score 23/24             Core Components/Risk Factors/Patient Goals at Admission:  Personal Goals and Risk Factors at Admission - 08/03/23 1100       Core Components/Risk Factors/Patient Goals on Admission    Weight Management Yes;Obesity    Intervention Weight Management/Obesity: Establish reasonable short term and long term weight goals.;Obesity: Provide education and appropriate resources to help participant work on and attain dietary goals.    Admit Weight 227 lb 11.8 oz (103.3 kg)    Expected Outcomes Short Term: Continue to assess and modify interventions until short term weight is achieved;Long Term: Adherence to nutrition and physical activity/exercise program aimed toward attainment of established weight goal;Weight Loss:  Understanding of general recommendations for a balanced deficit meal plan, which promotes 1-2 lb weight loss per week and includes a negative energy balance of [phone] kcal/d    Hypertension Yes    Intervention Provide education on lifestyle modifcations including regular physical activity/exercise, weight management, moderate sodium restriction and increased consumption of fresh fruit, vegetables, and low fat dairy, alcohol moderation, and smoking cessation.;Monitor prescription use compliance.    Expected Outcomes Short Term: Continued assessment and intervention until BP is < 140/34mm HG in hypertensive participants. < 130/22mm HG in hypertensive participants with diabetes, heart failure or chronic kidney disease.;Long Term: Maintenance of blood pressure at goal levels.    Lipids Yes    Intervention Provide education and support for participant on nutrition & aerobic/resistive exercise along with prescribed medications to achieve LDL 70mg , HDL >40mg .    Expected Outcomes Short Term: Participant states understanding of desired cholesterol values and is compliant with medications prescribed. Participant is following exercise prescription and nutrition guidelines.;Long Term: Cholesterol controlled with medications as prescribed, with individualized exercise RX and with personalized nutrition plan. Value goals: LDL < 70mg , HDL > 40 mg.    Stress Yes    Intervention Offer individual and/or small group education and counseling on adjustment to heart disease, stress management and health-related lifestyle change. Teach and support self-help strategies.    Expected Outcomes Short Term: Participant demonstrates changes in health-related behavior, relaxation and other stress management skills, ability to obtain effective social support, and compliance with psychotropic medications if prescribed.;Long Term: Emotional wellbeing is indicated by absence of clinically significant psychosocial distress or social  isolation.    Personal Goal Other Yes    Personal Goal Be as active as he was before. Be able to eat heart healthy foods that taste good or enjoy the foods he's used to eating. Be able to live a normal life without worrying or feeling down.    Intervention Develop an exercise action plan that he can follow to build strength and endurance, so that he can resume his previous activity level. Develop a nutrition action plan incorporating heart healthy foods that taste good that he can incorporate into his meal planning. Develop techniques he can use to help cope with stress.    Expected Outcomes Dedan will be able to resume his previous activites. He will comply with exercise and nutrition recommendations to live a healthy lifestyle. He will be able to cope with stressors.             Core Components/Risk Factors/Patient Goals Review:  Goals and Risk Factor Review     Row Name 08/11/23 1201 09/02/23 1325           Core Components/Risk Factors/Patient Goals Review   Personal Goals Review Weight Management/Obesity;Stress;Hypertension;Lipids Weight Management/Obesity;Stress;Hypertension;Lipids      Review Trysten started did well with exercise on 08/11/23. Vital signs were stable Boots is doing  well with exercise. Vital signs have been stable. Caulin has gained 1.9 kg since starting the program.      Expected Outcomes Zyree will continue to participate in cardiac rehab for exercise nutrtion and lifestyle modications Striker will continue to participate in cardiac rehab for exercise nutrtion and lifestyle modications               Core Components/Risk Factors/Patient Goals at Discharge (Final Review):   Goals and Risk Factor Review - 09/02/23 1325       Core Components/Risk Factors/Patient Goals Review   Personal Goals Review Weight Management/Obesity;Stress;Hypertension;Lipids    Review Pride is doing  well with exercise. Vital signs have been stable. Edem has gained 1.9 kg since starting  the program.    Expected Outcomes Ryley will continue to participate in cardiac rehab for exercise nutrtion and lifestyle modications             ITP Comments:  ITP Comments     Row Name 08/03/23 1020 08/11/23 1158 09/02/23 1319       ITP Comments Medical Director- Dr. Driscilla Moats, MD. Introduction to Pritikin Education Program  / Intensive Cardiac Rehab. Reviewed initial orientation folder with participant. 30 Day ITP Review. Landy started cardiac rehab on 08/11/23. Carolyn did well with exercise. 30 Day ITP Review. Tobby has good attendance and participation in cardiac rehab. Gatlyn is off to a good start to exercise.              Comments: See ITP Comments

## 2023-09-08 ENCOUNTER — Encounter (HOSPITAL_COMMUNITY)
Admission: RE | Admit: 2023-09-08 | Discharge: 2023-09-08 | Disposition: A | Discharge status: Home / Self Care | Payer: Medicaid Other | Source: Ambulatory Visit | Attending: Cardiology

## 2023-09-08 DIAGNOSIS — Z9889 Other specified postprocedural states: Secondary | ICD-10-CM

## 2023-09-08 DIAGNOSIS — Z952 Presence of prosthetic heart valve: Secondary | ICD-10-CM

## 2023-09-10 ENCOUNTER — Encounter (HOSPITAL_COMMUNITY)
Admission: RE | Admit: 2023-09-10 | Discharge: 2023-09-10 | Disposition: A | Discharge status: Home / Self Care | Payer: Medicaid Other | Source: Ambulatory Visit | Attending: Cardiology

## 2023-09-10 ENCOUNTER — Other Ambulatory Visit (HOSPITAL_COMMUNITY): Payer: Self-pay

## 2023-09-10 DIAGNOSIS — Z952 Presence of prosthetic heart valve: Secondary | ICD-10-CM | POA: Diagnosis not present

## 2023-09-10 DIAGNOSIS — Z9889 Other specified postprocedural states: Secondary | ICD-10-CM | POA: Diagnosis not present

## 2023-09-13 ENCOUNTER — Encounter (HOSPITAL_COMMUNITY)
Admission: RE | Admit: 2023-09-13 | Discharge: 2023-09-13 | Disposition: A | Discharge status: Home / Self Care | Payer: Medicaid Other | Source: Ambulatory Visit | Attending: Cardiology | Admitting: Cardiology

## 2023-09-13 DIAGNOSIS — Z952 Presence of prosthetic heart valve: Secondary | ICD-10-CM

## 2023-09-13 DIAGNOSIS — Z9889 Other specified postprocedural states: Secondary | ICD-10-CM

## 2023-09-14 ENCOUNTER — Ambulatory Visit: Payer: Medicaid Other | Attending: Thoracic Surgery (Cardiothoracic Vascular Surgery)

## 2023-09-14 ENCOUNTER — Ambulatory Visit (INDEPENDENT_AMBULATORY_CARE_PROVIDER_SITE_OTHER): Payer: Medicaid Other | Admitting: Licensed Clinical Social Worker

## 2023-09-14 ENCOUNTER — Ambulatory Visit: Payer: Medicaid Other | Admitting: Student

## 2023-09-14 VITALS — BP 133/79 | HR 66 | Temp 98.1°F | Ht 72.0 in | Wt 238.7 lb

## 2023-09-14 DIAGNOSIS — I4891 Unspecified atrial fibrillation: Secondary | ICD-10-CM | POA: Diagnosis not present

## 2023-09-14 DIAGNOSIS — Z5181 Encounter for therapeutic drug level monitoring: Secondary | ICD-10-CM

## 2023-09-14 DIAGNOSIS — Z8719 Personal history of other diseases of the digestive system: Secondary | ICD-10-CM

## 2023-09-14 DIAGNOSIS — R4589 Other symptoms and signs involving emotional state: Secondary | ICD-10-CM

## 2023-09-14 DIAGNOSIS — Z952 Presence of prosthetic heart valve: Secondary | ICD-10-CM

## 2023-09-14 DIAGNOSIS — I480 Paroxysmal atrial fibrillation: Secondary | ICD-10-CM

## 2023-09-14 DIAGNOSIS — S83511D Sprain of anterior cruciate ligament of right knee, subsequent encounter: Secondary | ICD-10-CM | POA: Diagnosis not present

## 2023-09-14 DIAGNOSIS — S83512D Sprain of anterior cruciate ligament of left knee, subsequent encounter: Secondary | ICD-10-CM | POA: Diagnosis not present

## 2023-09-14 DIAGNOSIS — I10 Essential (primary) hypertension: Secondary | ICD-10-CM | POA: Diagnosis not present

## 2023-09-14 DIAGNOSIS — Z135 Encounter for screening for eye and ear disorders: Secondary | ICD-10-CM

## 2023-09-14 DIAGNOSIS — G4733 Obstructive sleep apnea (adult) (pediatric): Secondary | ICD-10-CM

## 2023-09-14 LAB — POCT INR: INR: 2 (ref 2.0–3.0)

## 2023-09-14 NOTE — BH Specialist Note (Signed)
Patient no-showed today's appointment; appointment was for Telephone visit at 9:00am  Behavioral Health Clinician [facility] from here on out)  attempted patient  via Telephone and received busy tone and unable to leave VM.   [facility] contacted patient from telephone number [phone].   Patient will need to reschedule appointment by calling Internal medicine center [phone].  Christen Butter, MSW, LCSW-A She/Her Behavioral Health Clinician [facility]  Internal Medicine Center Direct [phone]  Fax [phone] Main Office Phone: [phone] [address] [place] [place], [place] [address] Website: [facility] Internal Medicine [facility]  [place], [place]  [place]

## 2023-09-14 NOTE — Patient Instructions (Signed)
Thank you, [name] for allowing [place] to provide your care today. Today we discussed   Your recent surgery, your cardiac rehab, referrals to eye doctor and orthopedics. Please pick up your letter at your earliest convince.    Please follow-up in: 6-8 weeks     We look forward to seeing you next time. Please call our clinic at [phone] if you have any questions or concerns. The best time to call is Monday-Friday from 9am-4pm, but there is someone available 24/7. If after hours or the weekend, call the main hospital number and ask for the Internal Medicine Resident On-Call. If you need medication refills, please notify your pharmacy one week in advance and they will send [place] a request.   Thank you for letting us take part in your care. Wishing you the best!  Morene Crocker, MD 09/14/2023, 10:57 AM [facility]

## 2023-09-14 NOTE — Progress Notes (Unsigned)
Subjective:  CC: work excuse  HPI:  [name] is a 52 y.o. male with a past medical history stated below and presents today for follow up and work excuse after his aortic valve replacement in July 2024. [name] Please see problem based assessment and plan for additional details.  Past Medical History:  Diagnosis Date   Aortic aneurysm without rupture (HCC)    Chest pain of uncertain etiology 08/11/2021   CKD (chronic kidney disease)    GERD (gastroesophageal reflux disease)    Heart failure (HCC)    Hypertension    Obesity (BMI 30-39.9) 08/11/2021   Sleep apnea     Current Outpatient Medications on File Prior to Visit  Medication Sig Dispense Refill   aspirin EC 81 MG tablet Take 1 tablet (81 mg total) by mouth daily. Swallow whole. 30 tablet 12   cyclobenzaprine (FLEXERIL) 5 MG tablet Take 5 mg by mouth 3 (three) times daily as needed for muscle spasms.     metoprolol succinate (TOPROL-XL) 25 MG 24 hr tablet Take 0.5 tablets (12.5 mg total) by mouth daily with breakfast. 30 tablet 2   rosuvastatin (CRESTOR) 5 MG tablet Take 1 tablet (5 mg total) by mouth daily. (Patient taking differently: Take 5 mg by mouth at bedtime.) 90 tablet 3   sildenafil (VIAGRA) 25 MG tablet Take 100 mg by mouth daily as needed for erectile dysfunction.     warfarin (COUMADIN) 2.5 MG tablet Take 2 tablets (5 mg total) by mouth daily. Or as directed by the [facility] 60 tablet 11   No current facility-administered medications on file prior to visit.    Family History  Problem Relation Age of Onset   Cancer Sister    Hearing loss Maternal Grandmother    Cancer Maternal Grandfather     Social History   Socioeconomic History   Marital status: Single    Spouse name: n/a   Number of children: 3   Years of education: Not on file   Highest education level: Not on file  Occupational History   Occupation: Education administrator    Comment: unemployed  Tobacco Use   Smoking status: Never   Smokeless  tobacco: Never  Vaping Use   Vaping status: Never Used  Substance and Sexual Activity   Alcohol use: Yes    Comment: weekends, 1-2 drinks   Drug use: No   Sexual activity: Not on file  Other Topics Concern   Not on file  Social History Narrative   Not on file   Social Determinants of Health   Financial Resource Strain: Not on file  Food Insecurity: No Food Insecurity (07/05/2023)   Hunger Vital Sign    Worried About Running Out of Food in the Last Year: Never true    Ran Out of Food in the Last Year: Never true  Transportation Needs: No Transportation Needs (07/05/2023)   PRAPARE - Administrator, Civil Service (Medical): No    Lack of Transportation (Non-Medical): No  Physical Activity: Not on file  Stress: Not on file  Social Connections: Unknown (03/19/2023)   Received from [facility]   Social Network    Social Network: Not on file  Intimate Partner Violence: Not At Risk (07/05/2023)   Humiliation, Afraid, Rape, and Kick questionnaire    Fear of Current or Ex-Partner: No    Emotionally Abused: No    Physically Abused: No    Sexually Abused: No    Review of  Systems: ROS negative except for what is noted on the assessment and plan.  Objective:   Vitals:   09/14/23 1015  BP: 133/79  Pulse: 66  Temp: 98.1 F (36.7 C)  TempSrc: Oral  SpO2: 100%  Weight: 238 lb 11.2 oz (108.3 kg)  Height: 6' (1.829 m)    Physical Exam: Constitutional: well-appearing man in no acute distress HENT: normocephalic atraumatic, mucous membranes moist Eyes: conjunctiva non-erythematous Neck: supple Cardiovascular: regular rate and rhythm, mechanical click audible without stethoscope Pulmonary/Chest: normal work of breathing on room air, lungs clear to auscultation bilaterally Abdominal: soft, non-tender, non-distended MSK: normal bulk and tone Neurological: alert & oriented x 3, 5/5 strength in bilateral upper and lower extremities, antalgic gait Skin:  warm and dry Psych: Pleasant mood and affect       08/11/2023    1:46 PM  Depression screen PHQ 2/9  Decreased Interest 1  Down, Depressed, Hopeless 1  PHQ - 2 Score 2  Altered sleeping 2  Tired, decreased energy 2  Change in appetite 0  Feeling bad or failure about yourself  2  Trouble concentrating 1  Moving slowly or fidgety/restless 0  Suicidal thoughts 0  PHQ-9 Score 9  Difficult doing work/chores Somewhat difficult       08/11/2023    1:46 PM 08/06/2021    3:42 PM  GAD 7 : Generalized Anxiety Score  Nervous, Anxious, on Edge 1 1  Control/stop worrying 3 1  Worry too much - different things 3 1  Trouble relaxing 2 1  Restless 0 0  Easily annoyed or irritable 1 0  Afraid - awful might happen 3 0  Total GAD 7 Score 13 4  Anxiety Difficulty Somewhat difficult Somewhat difficult     Assessment & Plan:   Hypertension Controlled. Reviewed patient's home records. -continue Toprol XL 25 mg  Atrial fibrillation (HCC) Post AV replacement atrial fibrillation. No chest pain or recurrence in arm pain. Continues on Warfarin and Toprol. Patient is not currently on amiodarone. INR monitored through heart care office.   Aortic valve replaced S/P Bentall procedure. Has improvement in respiratory symptoms however, still has significant shortness of breath without chest pain. He continues to have difficulty with short term distance walks. He is currently in cardiac rehab and progressing. He works in [employer] and has not been able to work. Today he requests a letter to allow communicate the need for extended leave. Recommended FMLA paperwork and he will inquire about this. No cardiopulmonary symptoms today or evidence of volume overload. -Continue cardiac rehab -Patient will follow up in Cardiology clinic in March 2024 -RTC in 4-8 weeks for monitoring  OSA (obstructive sleep apnea) Continues to use PAP machine  History of GI diverticular bleed Ulcer in the ascending colon  without bleeding stigmata. 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 Internal hemorrhoids. Diverticulosis throughout the colon. Hematochezia thought to be secondary to diverticular bleed. No recent episodes. Stable hemoglobin in the past two encounters -Continue to monitor  Bilateral ACL tear Would like to be referred to orthopedic where he was previously established. They are request new referral now.    Return in about 6 weeks (around 10/26/2023).  Patient discussed with Dr. Rosalia Hammers, MD [facility] Internal Medicine [facility]  09/15/2023, 8:32 AM

## 2023-09-14 NOTE — Patient Instructions (Signed)
Take 3 tablets of warfarin tomorrow and then continue taking warfarin 2 TABLETS DAILY EXCEPT 3 TABLETS ON TUESDAYS AND SATURDAYS.   Recheck INR in 3 weeks. Stay consistent with greens each week-2 times per week.  [facility] [phone] Amio stopped on 07/22/23.

## 2023-09-15 ENCOUNTER — Encounter: Payer: Self-pay | Admitting: Student

## 2023-09-15 ENCOUNTER — Encounter (HOSPITAL_COMMUNITY)
Admission: RE | Admit: 2023-09-15 | Discharge: 2023-09-15 | Disposition: A | Discharge status: Home / Self Care | Payer: Medicaid Other | Source: Ambulatory Visit | Attending: Cardiology | Admitting: Cardiology

## 2023-09-15 DIAGNOSIS — Z952 Presence of prosthetic heart valve: Secondary | ICD-10-CM | POA: Diagnosis not present

## 2023-09-15 DIAGNOSIS — Z9889 Other specified postprocedural states: Secondary | ICD-10-CM | POA: Diagnosis not present

## 2023-09-15 NOTE — Assessment & Plan Note (Signed)
Controlled. Reviewed patient's home records. -continue Toprol XL 25 mg

## 2023-09-15 NOTE — Assessment & Plan Note (Signed)
S/P Bentall procedure. Has improvement in respiratory symptoms however, still has significant shortness of breath without chest pain. He continues to have difficulty with short term distance walks. He is currently in cardiac rehab and progressing. He works in [employer] and has not been able to work. Today he requests a letter to allow communicate the need for extended leave. Recommended FMLA paperwork and he will inquire about this. No cardiopulmonary symptoms today or evidence of volume overload. -Continue cardiac rehab -Patient will follow up in Cardiology clinic in March 2024 -RTC in 4-8 weeks for monitoring

## 2023-09-15 NOTE — Assessment & Plan Note (Signed)
Continues to use PAP machine

## 2023-09-15 NOTE — Assessment & Plan Note (Signed)
Would like to be referred to orthopedic where he was previously established. They are request new referral now.

## 2023-09-15 NOTE — Assessment & Plan Note (Signed)
Ulcer in the ascending colon without bleeding stigmata. 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 Internal hemorrhoids. Diverticulosis throughout the colon. Hematochezia thought to be secondary to diverticular bleed. No recent episodes. Stable hemoglobin in the past two encounters -Continue to monitor

## 2023-09-15 NOTE — Progress Notes (Signed)
Internal Medicine Clinic Attending  Case discussed with the resident at the time of the visit.  We reviewed the resident's history and exam and pertinent patient test results.  I agree with the assessment, diagnosis, and plan of care documented in the resident's note.  

## 2023-09-15 NOTE — Assessment & Plan Note (Signed)
Post AV replacement atrial fibrillation. No chest pain or recurrence in arm pain. Continues on Warfarin and Toprol. Patient is not currently on amiodarone. INR monitored through heart care office.

## 2023-09-17 ENCOUNTER — Encounter (HOSPITAL_COMMUNITY): Payer: Medicaid Other

## 2023-09-19 DIAGNOSIS — Z419 Encounter for procedure for purposes other than remedying health state, unspecified: Secondary | ICD-10-CM | POA: Diagnosis not present

## 2023-09-20 ENCOUNTER — Encounter (HOSPITAL_COMMUNITY)
Admission: RE | Admit: 2023-09-20 | Discharge: 2023-09-20 | Disposition: A | Discharge status: Home / Self Care | Payer: Medicaid Other | Source: Ambulatory Visit | Attending: Cardiology | Admitting: Cardiology

## 2023-09-20 DIAGNOSIS — Z952 Presence of prosthetic heart valve: Secondary | ICD-10-CM | POA: Diagnosis not present

## 2023-09-20 DIAGNOSIS — Z48812 Encounter for surgical aftercare following surgery on the circulatory system: Secondary | ICD-10-CM | POA: Insufficient documentation

## 2023-09-20 DIAGNOSIS — G4733 Obstructive sleep apnea (adult) (pediatric): Secondary | ICD-10-CM | POA: Diagnosis not present

## 2023-09-20 DIAGNOSIS — Z9889 Other specified postprocedural states: Secondary | ICD-10-CM | POA: Diagnosis not present

## 2023-09-21 ENCOUNTER — Encounter: Payer: Self-pay | Admitting: Orthopaedic Surgery

## 2023-09-21 ENCOUNTER — Ambulatory Visit (INDEPENDENT_AMBULATORY_CARE_PROVIDER_SITE_OTHER): Payer: Medicaid Other | Admitting: Orthopaedic Surgery

## 2023-09-21 DIAGNOSIS — M1711 Unilateral primary osteoarthritis, right knee: Secondary | ICD-10-CM

## 2023-09-21 DIAGNOSIS — M170 Bilateral primary osteoarthritis of knee: Secondary | ICD-10-CM

## 2023-09-21 DIAGNOSIS — M1712 Unilateral primary osteoarthritis, left knee: Secondary | ICD-10-CM

## 2023-09-21 MED ORDER — METHYLPREDNISOLONE ACETATE 40 MG/ML IJ SUSP
40.0000 mg | INTRAMUSCULAR | Status: AC | PRN
  Administered 2023-09-21: 40 mg via INTRA_ARTICULAR

## 2023-09-21 MED ORDER — LIDOCAINE HCL 1 % IJ SOLN
2.0000 mL | INTRAMUSCULAR | Status: AC | PRN
  Administered 2023-09-21: 2 mL

## 2023-09-21 MED ORDER — BUPIVACAINE HCL 0.5 % IJ SOLN
2.0000 mL | INTRAMUSCULAR | Status: AC | PRN
  Administered 2023-09-21: 2 mL via INTRA_ARTICULAR
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
## 2023-09-22 ENCOUNTER — Encounter (HOSPITAL_COMMUNITY)
Admission: RE | Admit: 2023-09-22 | Discharge: 2023-09-22 | Disposition: A | Discharge status: Home / Self Care | Payer: Medicaid Other | Source: Ambulatory Visit | Attending: Cardiology | Admitting: Cardiology

## 2023-09-22 DIAGNOSIS — Z9889 Other specified postprocedural states: Secondary | ICD-10-CM

## 2023-09-22 DIAGNOSIS — Z952 Presence of prosthetic heart valve: Secondary | ICD-10-CM | POA: Diagnosis not present

## 2023-09-22 DIAGNOSIS — Z48812 Encounter for surgical aftercare following surgery on the circulatory system: Secondary | ICD-10-CM | POA: Diagnosis not present

## 2023-09-24 ENCOUNTER — Encounter (HOSPITAL_COMMUNITY)
Admission: RE | Admit: 2023-09-24 | Discharge: 2023-09-24 | Disposition: A | Discharge status: Home / Self Care | Payer: Medicaid Other | Source: Ambulatory Visit | Attending: Cardiology

## 2023-09-24 DIAGNOSIS — Z48812 Encounter for surgical aftercare following surgery on the circulatory system: Secondary | ICD-10-CM | POA: Diagnosis not present

## 2023-09-24 DIAGNOSIS — Z9889 Other specified postprocedural states: Secondary | ICD-10-CM

## 2023-09-24 DIAGNOSIS — Z952 Presence of prosthetic heart valve: Secondary | ICD-10-CM | POA: Diagnosis not present

## 2023-09-27 ENCOUNTER — Telehealth: Payer: Self-pay | Admitting: *Deleted

## 2023-09-27 ENCOUNTER — Encounter (HOSPITAL_COMMUNITY)
Admission: RE | Admit: 2023-09-27 | Discharge: 2023-09-27 | Disposition: A | Discharge status: Home / Self Care | Payer: Medicaid Other | Source: Ambulatory Visit | Attending: Cardiology

## 2023-09-27 ENCOUNTER — Encounter: Payer: Medicaid Other | Admitting: Internal Medicine

## 2023-09-27 DIAGNOSIS — Z952 Presence of prosthetic heart valve: Secondary | ICD-10-CM

## 2023-09-27 DIAGNOSIS — Z9889 Other specified postprocedural states: Secondary | ICD-10-CM

## 2023-09-27 NOTE — Progress Notes (Signed)
Incomplete Session Note  Patient Details  Name: SHADD LEGNON MRN: [id number] Date of Birth: [birth year] Referring Provider:   Flowsheet Row INTENSIVE CARDIAC REHAB ORIENT from 08/03/2023 in [facility]  Referring Provider Thomasene Ripple, DO       [name] did not complete his rehab session.  Reports feeling lightheaded says has felt lightheaded intermittently since Saturday. Sitting blood pressure 112/80. Standing blood pressure 120/80. Reports having a headache. Temperature 98.6. Telemetry rhythm Sinus 78. Denies having a cough or shortness of breath. Medications reviewed. Taking as prescribed. Weight today 106.2. Denies having any bloody stools. Connie Bison that he not exercise if he feels light headed. Will consult with onsite provider Carlos Levering DNP.   Per onsite provider Carlos Levering DNP.  Is he eating and hydrating well? He should probably see PCP to get some labs to check on his hemoglobin.   Dr McLendon's office was called and notified. Triage nurse spoke with the patient over the phone. Kenyata said that he ate breakfast and drank about 16 ounces of water. Patient was given an additional 8 ounces of water. Will hold exercise until Mashaun is seen in the office and cleared to return to exercise by his primary care provider.   Kitai was instructed to make sure he is eating and drinking adequately throughout the day. Patient instructed to go to the ED if symptoms persist. Thayer Headings RN BSN

## 2023-09-27 NOTE — Telephone Encounter (Signed)
Received a call from [facility] at Cardiac Rehab. Stated pt c/o being light-headed since Saturday. VS - 112/80 sitting and 120/80 standing HR- 78. T- 98.6. No available appts this week at this time. Stated they will hold rehab until seen by his doctor. I talked to pt who stated intermittent dizziness/light headedness -stated he felt this way when his Hgb was low. Pt's wanting to know if the doctor could order labs. First available appt has been scheduled for 12/19 @ 1345 PM.

## 2023-09-28 ENCOUNTER — Telehealth: Payer: Self-pay | Admitting: Cardiology

## 2023-09-28 ENCOUNTER — Other Ambulatory Visit: Payer: Self-pay

## 2023-09-28 ENCOUNTER — Emergency Department (HOSPITAL_BASED_OUTPATIENT_CLINIC_OR_DEPARTMENT_OTHER)
Admission: EM | Admit: 2023-09-28 | Discharge: 2023-09-28 | Disposition: A | Discharge status: Home / Self Care | Payer: Medicaid Other | Attending: Emergency Medicine | Admitting: Emergency Medicine

## 2023-09-28 ENCOUNTER — Ambulatory Visit (INDEPENDENT_AMBULATORY_CARE_PROVIDER_SITE_OTHER): Payer: Medicaid Other | Admitting: Licensed Clinical Social Worker

## 2023-09-28 DIAGNOSIS — Z7982 Long term (current) use of aspirin: Secondary | ICD-10-CM | POA: Insufficient documentation

## 2023-09-28 DIAGNOSIS — Z79899 Other long term (current) drug therapy: Secondary | ICD-10-CM | POA: Diagnosis not present

## 2023-09-28 DIAGNOSIS — R42 Dizziness and giddiness: Secondary | ICD-10-CM | POA: Insufficient documentation

## 2023-09-28 DIAGNOSIS — Z7901 Long term (current) use of anticoagulants: Secondary | ICD-10-CM | POA: Insufficient documentation

## 2023-09-28 DIAGNOSIS — R4589 Other symptoms and signs involving emotional state: Secondary | ICD-10-CM

## 2023-09-28 LAB — CBC
HCT: 42.4 % (ref 39.0–52.0)
Hemoglobin: 13.7 g/dL (ref 13.0–17.0)
MCH: 28.1 pg (ref 26.0–34.0)
MCHC: 32.3 g/dL (ref 30.0–36.0)
MCV: 86.9 fL (ref 80.0–100.0)
Platelets: 335 10*3/uL (ref 150–400)
RBC: 4.88 MIL/uL (ref 4.22–5.81)
RDW: 15.2 % (ref 11.5–15.5)
WBC: 5.6 10*3/uL (ref 4.0–10.5)
nRBC: 0 % (ref 0.0–0.2)

## 2023-09-28 LAB — URINALYSIS, ROUTINE W REFLEX MICROSCOPIC
Bilirubin Urine: NEGATIVE
Glucose, UA: NEGATIVE mg/dL
Hgb urine dipstick: NEGATIVE
Ketones, ur: NEGATIVE mg/dL
Leukocytes,Ua: NEGATIVE
Nitrite: NEGATIVE
Protein, ur: NEGATIVE mg/dL
Specific Gravity, Urine: 1.023 (ref 1.005–1.030)
pH: 7.5 (ref 5.0–8.0)

## 2023-09-28 LAB — BASIC METABOLIC PANEL
Anion gap: 5 (ref 5–15)
BUN: 20 mg/dL (ref 6–20)
CO2: 27 mmol/L (ref 22–32)
Calcium: 8.6 mg/dL — ABNORMAL LOW (ref 8.9–10.3)
Chloride: 104 mmol/L (ref 98–111)
Creatinine, Ser: 1.51 mg/dL — ABNORMAL HIGH (ref 0.61–1.24)
GFR, Estimated: 56 mL/min — ABNORMAL LOW (ref >60)
Glucose, Bld: 86 mg/dL (ref 70–99)
Potassium: 4.2 mmol/L (ref 3.5–5.1)
Sodium: 136 mmol/L (ref 135–145)

## 2023-09-28 LAB — PROTIME-INR
INR: 2 — ABNORMAL HIGH (ref 0.8–1.2)
Prothrombin Time: 23 s — ABNORMAL HIGH (ref 11.4–15.2)

## 2023-09-28 LAB — CBG MONITORING, ED: Glucose-Capillary: 85 mg/dL (ref 70–99)

## 2023-09-28 MED ORDER — MECLIZINE HCL 25 MG PO TABS
25.0000 mg | ORAL_TABLET | Freq: Three times a day (TID) | ORAL | 0 refills | Status: AC | PRN
  Filled 2023-09-28: qty 30, 10d supply, fill #0

## 2023-09-28 NOTE — Telephone Encounter (Signed)
STAT if patient feels like he/she is going to faint   1. Are you feeling dizzy, lightheaded, or faint right now? No    2. Have you passed out?  No (If yes move to .[facility])   3. Do you have any other symptoms? Headache Sunday, heart flutter    4. Have you checked your HR and BP (record if available)? It was checked at Cardiac Rehab yesterday but he does  not remember what it was. Cardiac Rehab will not let him continue until the doctor clears him

## 2023-09-28 NOTE — Telephone Encounter (Signed)
Tommie Sams, MD; Demetrius Charity Imp Triage Nurse Pool; P Imp Front Desk Pool A patient is requesting an earlier appointment than December 19, stating they are available anytime. They are experiencing persistent dizziness and lightheadedness, which are not subsiding. The patient cannot return to cardiac rehab until cleared by a doctor and is considering going to the ER as he states he was instructed to do. . Patient stated he is also reaching out to his cardiologist this morning.

## 2023-09-28 NOTE — ED Triage Notes (Signed)
Pt caox4 c/o feeling lightheaded intermittently since Sat. Pt states upon position changes and certain movements it worsens. Pt denies blurred vision, extremity weakness, speech abnormalities. Pt states last time he felt dizzy like this he had low hemoglobin.

## 2023-09-28 NOTE — Discharge Instructions (Signed)
Follow-up with your physician for recheck.  Make sure you are drinking plenty of fluids.  Take the medicine for dizziness as directed.  Return to the emergency department if any problems.

## 2023-09-28 NOTE — Telephone Encounter (Signed)
Spoke to patient he stated he started having dizziness off and on this past Sat.Stated he feels better today.Stated he had some dizziness this morning.None at present.B/P at present 156/90 pulse 80. No chest pain.Stated cardiac rehab wanted him to be seen before he comes back.Appointment scheduled with Gillian Shields NP 12/16 at 2:45 pm.

## 2023-09-28 NOTE — ED Notes (Signed)
Spoke with lab to add on PT-INR

## 2023-09-28 NOTE — ED Notes (Signed)
Pt aware of need for urine sample.  

## 2023-09-28 NOTE — Telephone Encounter (Signed)
I called pt about scheduling a earlier appt - no answer; left message of the office's call. Then I noticed pt is currently at [facility]- ED at [facility].

## 2023-09-28 NOTE — Patient Instructions (Incomplete)
Visit Information  Thank you for taking time to visit with me today. Please don't hesitate to contact me if I can be of assistance to you.   Following are the goals we discussed today:   Goals Addressed   None     Our next appointment is {NEXTVISITTYPE:26617} on *** at ***  Please call the care guide team at [phone] if you need to cancel or reschedule your appointment.   If you are experiencing a Mental Health or Behavioral Health Crisis or need someone to talk to, please {MHBHCRISISCONTACTS:26616}   {CM PT PRINT INSTRUCTIONS:22237}  {CM FOLLOW UP PLAN:22241}  SIGNATURE***

## 2023-09-29 ENCOUNTER — Encounter (HOSPITAL_COMMUNITY): Payer: Medicaid Other

## 2023-09-29 ENCOUNTER — Other Ambulatory Visit (HOSPITAL_COMMUNITY): Payer: Self-pay

## 2023-09-29 NOTE — ED Provider Notes (Signed)
[place] EMERGENCY DEPARTMENT AT [facility] Provider Note   CSN: [id number] Arrival date & time: 09/28/23  1442     History  Chief Complaint  Patient presents with   Dizziness    Timothy Browning is a 52 y.o. male.  Patient complains of dizziness that comes and goes.  Patient reports that he experiences dizziness when he changes position.  Patient reports he has felt like this in the past when his hemoglobin was low.  He is currently on Coumadin after having a Nordic valve replacement.  Patient states that he thought he should get his hemoglobin checked.  Patient denies any weakness.  He is not currently experiencing any symptoms.  Patient reports he has had symptoms on and off since Saturday.  He reports he has no symptoms when he is lying down.  He has no difficulty walking.  Patient denies any headache he has not had any falls he has not had any injuries.  Patient denies any chest pain he is not having any shortness of breath.  He denies any weakness in his face no weakness in his extremities.   Dizziness      Home Medications Prior to Admission medications   Medication Sig Start Date End Date Taking? Authorizing Provider  meclizine (ANTIVERT) 25 MG tablet Take 1 tablet (25 mg total) by mouth 3 (three) times daily as needed for dizziness. 09/28/23  Yes Elson Areas, PA-C  aspirin EC 81 MG tablet Take 1 tablet (81 mg total) by mouth daily. Swallow whole. 05/14/23   Leary Roca, PA-C  cyclobenzaprine (FLEXERIL) 5 MG tablet Take 5 mg by mouth 3 (three) times daily as needed for muscle spasms.    [provider]  metoprolol succinate (TOPROL-XL) 25 MG 24 hr tablet Take 0.5 tablets (12.5 mg total) by mouth daily with breakfast. 07/16/23 01/12/24  Marrianne Mood, MD  rosuvastatin (CRESTOR) 5 MG tablet Take 1 tablet (5 mg total) by mouth daily. Patient taking differently: Take 5 mg by mouth at bedtime. 09/23/22   Masters, Katie, DO  sildenafil (VIAGRA) 25  MG tablet Take 100 mg by mouth daily as needed for erectile dysfunction.    [provider]  warfarin (COUMADIN) 2.5 MG tablet Take 2 tablets (5 mg total) by mouth daily. Or as directed by the [facility] 05/14/23 05/13/24  Leary Roca, PA-C      Allergies    Ibuprofen    Review of Systems   Review of Systems  Neurological:  Positive for dizziness.  All other systems reviewed and are negative.   Physical Exam Updated Vital Signs BP 120/84 (BP Location: Right Arm)   Pulse 73   Temp 98.7 F (37.1 C) (Oral)   Resp 20   Ht 6' (1.829 m)   Wt 106.6 kg   SpO2 100%   BMI 31.87 kg/m  Physical Exam Vitals and nursing note reviewed.  Constitutional:      Appearance: He is well-developed.  HENT:     Head: Normocephalic.     Right Ear: External ear normal.     Left Ear: External ear normal.     Nose: Nose normal.     Mouth/Throat:     Mouth: Mucous membranes are dry.  Eyes:     Extraocular Movements: Extraocular movements intact.     Conjunctiva/sclera: Conjunctivae normal.     Pupils: Pupils are equal, round, and reactive to light.  Cardiovascular:     Rate and Rhythm: Normal rate.  Pulses: Normal pulses.     Comments: Aortic valve click Pulmonary:     Effort: Pulmonary effort is normal.     Breath sounds: Normal breath sounds.  Abdominal:     General: Abdomen is flat. There is no distension.  Musculoskeletal:        General: Normal range of motion.     Cervical back: Normal range of motion.  Skin:    General: Skin is warm.  Neurological:     General: No focal deficit present.     Mental Status: He is alert and oriented to person, place, and time.  Psychiatric:        Mood and Affect: Mood normal.        Behavior: Behavior normal.     ED Results / Procedures / Treatments   Labs (all labs ordered are listed, but only abnormal results are displayed) Labs Reviewed  BASIC METABOLIC PANEL - Abnormal; Notable for the following components:       Result Value   Creatinine, Ser 1.51 (*)    Calcium 8.6 (*)    GFR, Estimated 56 (*)    All other components within normal limits  PROTIME-INR - Abnormal; Notable for the following components:   Prothrombin Time 23.0 (*)    INR 2.0 (*)    All other components within normal limits  CBC  URINALYSIS, ROUTINE W REFLEX MICROSCOPIC  CBG MONITORING, ED    EKG EKG Interpretation Date/Time:  Tuesday September 28 2023 15:04:41 EST Ventricular Rate:  75 PR Interval:  184 QRS Duration:  95 QT Interval:  401 QTC Calculation: 448 R Axis:   16  Text Interpretation: Sinus rhythm Probable left atrial enlargement ST elev, probable normal early repol pattern No significant change since last tracing Confirmed by Palumbo, April ([id number]) on 09/29/2023 8:57:05 AM  Radiology No results found.  Procedures Procedures    Medications Ordered in ED Medications - No data to display  ED Course/ Medical Decision Making/ A&P                                 Medical Decision Making Patient reports he is experienced some dizziness with movement for the past 4 days.  Amount and/or Complexity of Data Reviewed External Data Reviewed: notes.    Details: Primary care notes reviewed, cardiology notes reviewed Labs: ordered. Decision-making details documented in ED Course.    Details: labs ordered reviewed and interpreted.  Patient's hemoglobin is stable at 13.  INR is 2.0 ECG/medicine tests: ordered and independent interpretation performed. Decision-making details documented in ED Course.    Details: EKG shows normal sinus rhythm with no acute changes  Risk Risk Details: He is currently asymptomatic.  Symptoms sound like vertigo.  Patient does not have any neurologic symptoms that are concerning for CVA.  Labs are normal.  I will try patient on meclizine he is advised to schedule follow-up appointment with his primary care physician for recheck.           Final Clinical Impression(s) / ED  Diagnoses Final diagnoses:  Vertigo    Rx / DC Orders ED Discharge Orders          Ordered    meclizine (ANTIVERT) 25 MG tablet  3 times daily PRN        09/28/23 1821          An After Visit Summary was printed and given to the patient.  Elson Areas, [place] 09/29/23 1534    Gwyneth Sprout, MD 09/30/23 2040

## 2023-09-29 NOTE — Progress Notes (Signed)
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 Swallow whole., Disp: 30 tablet, Rfl: 12   cyclobenzaprine (FLEXERIL) 5 MG tablet, Take 5 mg by mouth 3 (three) times daily as needed for muscle spasms., Disp: , Rfl:    metoprolol succinate (TOPROL-XL) 25 MG 24 hr tablet, Take 0.5 tablets (12.5 mg total) by mouth daily with breakfast., Disp: 30 tablet, Rfl: 2   rosuvastatin (CRESTOR) 5 MG tablet, Take 1 tablet (5 mg total) by mouth daily. (Patient taking differently: Take 5 mg by mouth at bedtime.), Disp: 90 tablet, Rfl: 3   sildenafil (VIAGRA) 25 MG tablet, Take 100 mg by mouth daily as needed for erectile dysfunction., Disp: , Rfl:    warfarin (COUMADIN) 2.5 MG tablet, Take 2 tablets (5 mg total) by mouth daily. Or as directed by the [facility], Disp: 60 tablet, Rfl: 11   meclizine (ANTIVERT) 25 MG tablet, Take 1 tablet (25 mg total) by mouth 3 (three) times daily as needed for dizziness., Disp: 30 tablet, Rfl: 0  Past Medical History: Past Medical History:  Diagnosis Date   Aortic aneurysm without rupture (HCC)    Chest pain of uncertain etiology 08/11/2021   CKD (chronic kidney disease)    GERD (gastroesophageal reflux disease)    Heart failure (HCC)    Hypertension     Obesity (BMI 30-39.9) 08/11/2021   Sleep apnea     Tobacco Use: Social History   Tobacco Use  Smoking Status Never  Smokeless Tobacco Never    Labs: Review Flowsheet  More data exists      Latest Ref Rng & Units 07/14/2022 05/04/2023 05/06/2023 05/07/2023 06/02/2023  Labs for ITP Cardiac and Pulmonary Rehab  Cholestrol 100 - 199 mg/dL 657  - - - 846   LDL (calc) 0 - 99 mg/dL 962  - - - 59   HDL-C >95 mg/dL 57  - - - 61   Trlycerides 0 - 149 mg/dL 70  - - - 62   Hemoglobin A1c 4.8 - 5.6 % 5.2  5.4  - - -  PH, Arterial 7.35 - 7.45 - 7.4  7.328  7.299  7.330  7.339  7.377  7.366  7.391  7.329  7.333  -  PCO2 arterial 32 - 48 mmHg - 38  43.1  43.9  41.5  38.0  38.7  38.4  40.8  42.9  45.0  -  Bicarbonate 20.0 - 28.0 mmol/L - 23.5  22.4  21.3  21.8  20.8  22.8  22.0  24.8  22.6  23.7  -  TCO2 22 - 32 mmol/L - - 24  23  23  22  24  24  23  27  26  27  25  24  25   -  Acid-base deficit 0.0 -  2.0 mmol/L - 1.1  3.0  5.0  4.0  5.0  2.0  3.0  3.0  2.0  -  O2 Saturation % - 97.4  92  92  97  95  99  100  100  100  96  -    Details       Multiple values from one day are sorted in reverse-chronological order         Capillary Blood Glucose: Lab Results  Component Value Date   GLUCAP 85 09/28/2023   GLUCAP 126 (H) 07/04/2023   GLUCAP 88 05/12/2023   GLUCAP 133 (H) 05/11/2023   GLUCAP 113 (H) 05/11/2023     Exercise Target Goals: Exercise Program Goal: Individual exercise prescription set using results from initial 6 min walk test and THRR while considering  patient's activity barriers and safety.   Exercise Prescription Goal: Initial exercise prescription builds to 30-45 minutes a day of aerobic activity, 2-3 days per week.  Home exercise guidelines will be given to patient during program as part of exercise prescription that the participant will acknowledge.  Activity Barriers & Risk Stratification:  Activity Barriers & Cardiac Risk Stratification - 08/03/23 1040        Activity Barriers & Cardiac Risk Stratification   Activity Barriers Back Problems;Other (comment);Balance Concerns    Comments Right foot drop (wears foot brace), chronic back pain, bilateral knee pain (needs knee replacement).    Cardiac Risk Stratification Low             6 Minute Walk:  6 Minute Walk     Row Name 08/03/23 1143         6 Minute Walk   Phase Initial     Distance 1161 feet     Walk Time 6 minutes     # of Rest Breaks 0     MPH 2.2     METS 3.42     RPE 9     Perceived Dyspnea  1     VO2 Peak 11.97     Symptoms Yes (comment)     Comments Mild shortness of breath. Back pain, chronic with walking, 3/10 on pain scale.     Resting HR 66 bpm     Resting BP 128/80     Resting Oxygen Saturation  100 %     Exercise Oxygen Saturation  during 6 min walk 99 %     Max Ex. HR 86 bpm     Max Ex. BP 132/76     2 Minute Post BP 110/80              Oxygen Initial Assessment:   Oxygen Re-Evaluation:   Oxygen Discharge (Final Oxygen Re-Evaluation):   Initial Exercise Prescription:  Initial Exercise Prescription - 08/03/23 1300       Date of Initial Exercise RX and Referring Provider   Date 08/03/23    Referring Provider Thomasene Ripple, DO    Expected Discharge Date 10/27/23      Recumbant Bike   Level 1    Watts 45    Minutes 15    METs 3.4      T5 Nustep   Level 2    SPM 85    Minutes 15    METs 3      Prescription Details   Frequency (times per week) 3    Duration Progress to 30 minutes of continuous aerobic without signs/symptoms of physical distress      Intensity  THRR 40-80% of Max Heartrate 68-135    Ratings of Perceived Exertion 11-13    Perceived Dyspnea 0-4      Progression   Progression Continue to progress workloads to maintain intensity without signs/symptoms of physical distress.      Resistance Training   Training Prescription Yes    Weight 5 lbs    Reps 10-15             Perform Capillary Blood Glucose checks  as needed.  Exercise Prescription Changes:   Exercise Prescription Changes     Row Name 08/11/23 1030 08/25/23 1028 09/06/23 1025 09/20/23 1024       Response to Exercise   Blood Pressure (Admit) 118/70 116/70 120/78 116/70    Blood Pressure (Exercise) 126/78 132/78 -- --    Blood Pressure (Exit) 118/72 114/74 118/70 116/62    Heart Rate (Admit) 76 bpm 70 bpm 76 bpm 74 bpm    Heart Rate (Exercise) 95 bpm 87 bpm 93 bpm 98 bpm    Heart Rate (Exit) 87 bpm 71 bpm 76 bpm 83 bpm    Rating of Perceived Exertion (Exercise) 11 12 12 11     Symptoms None None None None    Comments Off to a good start with exercise. Increased workload on recumbent bike today. -- Reviewed METs and goals with Timothy Browning. Encouraged to increase pace with workout as tolerated.    Duration Continue with 30 min of aerobic exercise without signs/symptoms of physical distress. Continue with 30 min of aerobic exercise without signs/symptoms of physical distress. Continue with 30 min of aerobic exercise without signs/symptoms of physical distress. Continue with 30 min of aerobic exercise without signs/symptoms of physical distress.    Intensity THRR unchanged THRR unchanged THRR unchanged THRR unchanged      Progression   Progression Continue to progress workloads to maintain intensity without signs/symptoms of physical distress. Continue to progress workloads to maintain intensity without signs/symptoms of physical distress. Continue to progress workloads to maintain intensity without signs/symptoms of physical distress. Continue to progress workloads to maintain intensity without signs/symptoms of physical distress.    Average METs 2.2 2.4 2.4 2.4      Resistance Training   Training Prescription No  Relaxation day, no weights. No  Relaxation day, no weights. Yes Yes    Weight -- -- 5 lbs 5 lbs    Reps -- -- 10-15 10-15    Time -- -- 10 Minutes 10 Minutes      Interval Training   Interval Training No No No No      Recumbant  Bike   Level 2 4 3 3     RPM 65 53 54 65    Watts 20 35 30 38    Minutes 15 15 15 15     METs 2.2 2.8 2.6 2.8      T5 Nustep   Level 2 3 3 3     SPM 97 83 95 90    Minutes 15 15 15 15     METs 2.3 2 2.2 2.1      Home Exercise Plan   Plans to continue exercise at -- Home (comment)  Walking Home (comment)  Walking Home (comment)  Walking    Frequency -- Add 2 additional days to program exercise sessions. Add 2 additional days to program exercise sessions. Add 2 additional days to program exercise sessions.    Initial Home Exercises Provided -- 08/18/23 08/18/23 08/18/23  Exercise Comments:   Exercise Comments     Row Name 08/11/23 1121 08/18/23 1044 08/27/23 1106 09/15/23 1045 09/20/23 1058   Exercise Comments Montie tolerated low intensity exercise well without symptoms. Increased workload on the recumbent bike from level 1 to 2. Oriented to stretching and exercise routine. Reviewed home exercise guidelines and goals with Timothy Browning. Increased workloads on Nustep and recumbent bike today. Reviewed METs with Timothy Browning. Decreased workload on recumbent bike from 4 to 3, he c/o knees burning at the higher workload. Reviewed goals with Timothy Browning. Oriented him to the recumbent elliptical machine. Reviewed METs with Timothy Browning. Discussed increasing workload and pace to help increase MET level and gain confidence with exercise. He will switch between recumbent bike and recumbent elliptical.            Exercise Goals and Review:   Exercise Goals     Row Name 08/03/23 1040             Exercise Goals   Increase Physical Activity Yes       Intervention Provide advice, education, support and counseling about physical activity/exercise needs.;Develop an individualized exercise prescription for aerobic and resistive training based on initial evaluation findings, risk stratification, comorbidities and participant's personal goals.       Expected Outcomes Short Term: Attend rehab on a regular  basis to increase amount of physical activity.;Long Term: Add in home exercise to make exercise part of routine and to increase amount of physical activity.;Long Term: Exercising regularly at least 3-5 days a week.       Increase Strength and Stamina Yes       Intervention Provide advice, education, support and counseling about physical activity/exercise needs.;Develop an individualized exercise prescription for aerobic and resistive training based on initial evaluation findings, risk stratification, comorbidities and participant's personal goals.       Expected Outcomes Short Term: Increase workloads from initial exercise prescription for resistance, speed, and METs.;Short Term: Perform resistance training exercises routinely during rehab and add in resistance training at home;Long Term: Improve cardiorespiratory fitness, muscular endurance and strength as measured by increased METs and functional capacity ( )       Able to understand and use rate of perceived exertion (RPE) scale Yes       Intervention Provide education and explanation on how to use RPE scale       Expected Outcomes Short Term: Able to use RPE daily in rehab to express subjective intensity level;Long Term:  Able to use RPE to guide intensity level when exercising independently       Knowledge and understanding of Target Heart Rate Range (THRR) Yes       Intervention Provide education and explanation of THRR including how the numbers were predicted and where they are located for reference       Expected Outcomes Short Term: Able to state/look up THRR;Long Term: Able to use THRR to govern intensity when exercising independently;Short Term: Able to use daily as guideline for intensity in rehab       Able to check pulse independently Yes       Intervention Provide education and demonstration on how to check pulse in carotid and radial arteries.;Review the importance of being able to check your own pulse for safety during independent  exercise       Expected Outcomes Short Term: Able to explain why pulse checking is important during independent exercise;Long Term: Able to check pulse independently and accurately       Understanding of Exercise  Prescription Yes       Intervention Provide education, explanation, and written materials on patient's individual exercise prescription       Expected Outcomes Short Term: Able to explain program exercise prescription;Long Term: Able to explain home exercise prescription to exercise independently                Exercise Goals Re-Evaluation :  Exercise Goals Re-Evaluation     Row Name 08/11/23 1121 08/18/23 1044 09/15/23 1045         Exercise Goal Re-Evaluation   Exercise Goals Review Increase Physical Activity;Increase Strength and Stamina;Able to understand and use rate of perceived exertion (RPE) scale;Knowledge and understanding of Target Heart Rate Range (THRR) Increase Physical Activity;Increase Strength and Stamina;Able to understand and use rate of perceived exertion (RPE) scale;Knowledge and understanding of Target Heart Rate Range (THRR);Understanding of Exercise Prescription Increase Physical Activity;Increase Strength and Stamina;Able to understand and use rate of perceived exertion (RPE) scale;Knowledge and understanding of Target Heart Rate Range (THRR);Understanding of Exercise Prescription     Comments Timothy Browning was able to understand and use RPE scale appropriately. Reviewed MET scale and THRR with him. Reviewed exercise prescription with Timothy Browning. Discussed increasing walking duration, and he is amenable to this. Progress workloads at cardiac rehab to help build confidence with exercise. Increased level on NuStep and Recumbent Bike today to level 3.0. Timothy Browning is making gradual progress with exercise. He does some walking at home but is limited by chronic knee and back pain. He needs bilateral knee replacement and may get cortisone injections to help with his knee pain. The knee  pain impedes his ability to participate in some leisure activities because his walking is limited. We discussed carying up his workout at cardiac rehab, and he tried the recubent elliptical today and tolerated well. He will also increase his hand weights to 6 lbs next week.     Expected Outcomes Progress workloads as tolerated to help increase cardiorespiratory fitness. Increase home walking duration 1-2 minutes each session with a goal of 30 minutes at least 2 days/week. Increase workloads as tolerated. Continue to progress workloads as tolerated.              Discharge Exercise Prescription (Final Exercise Prescription Changes):  Exercise Prescription Changes - 09/20/23 1024       Response to Exercise   Blood Pressure (Admit) 116/70    Blood Pressure (Exit) 116/62    Heart Rate (Admit) 74 bpm    Heart Rate (Exercise) 98 bpm    Heart Rate (Exit) 83 bpm    Rating of Perceived Exertion (Exercise) 11    Symptoms None    Comments Reviewed METs and goals with Timothy Browning. Encouraged to increase pace with workout as tolerated.    Duration Continue with 30 min of aerobic exercise without signs/symptoms of physical distress.    Intensity THRR unchanged      Progression   Progression Continue to progress workloads to maintain intensity without signs/symptoms of physical distress.    Average METs 2.4      Resistance Training   Training Prescription Yes    Weight 5 lbs    Reps 10-15    Time 10 Minutes      Interval Training   Interval Training No      Recumbant Bike   Level 3    RPM 65    Watts 38    Minutes 15    METs 2.8      T5 Nustep   Level  3    SPM 90    Minutes 15    METs 2.1      Home Exercise Plan   Plans to continue exercise at Home (comment)   Walking   Frequency Add 2 additional days to program exercise sessions.    Initial Home Exercises Provided 08/18/23             Nutrition:  Target Goals: Understanding of nutrition guidelines, daily intake of sodium  1500mg , cholesterol 200mg , calories 30% from fat and 7% or less from saturated fats, daily to have 5 or more servings of fruits and vegetables.  Biometrics:  Pre Biometrics - 08/03/23 1020       Pre Biometrics   Waist Circumference 40.25 inches    Hip Circumference 43.5 inches    Waist to Hip Ratio 0.93 %    Triceps Skinfold 13 mm    % Body Fat 27.2 %    Grip Strength 54 kg    Flexibility 0 in    Single Leg Stand 24.37 seconds              Nutrition Therapy Plan and Nutrition Goals:  Nutrition Therapy & Goals - 09/06/23 1112       Nutrition Therapy   Diet Heart Healthy Diet    Drug/Food Interactions Statins/Certain Fruits;Coumadin/Vit K      Personal Nutrition Goals   Nutrition Goal Patient to identify strategies for reducing cardiovascular risk by attending the [facility] education and nutrition series weekly.   Goal in action.   Personal Goal #2 Patient to improve diet quality by using the plate method as a guide for meal planning to include lean protein/plant protein, fruits, vegetables, whole grains, nonfat dairy as part of a well-balanced diet.   goal in action.   Personal Goal #3 Patient to limit sodium to 1500mg  per day   goal in action.   Comments Goals in action. Patient has medical history of aortic aneurysm repair, HTN, CKD, Afib, obesity, OSA, CHF. He continues to attend the [facility] education and nutrition series. He has started making many dietary changes including reduced saturated fat intake, increased dietary fiber, and reading food labels for sodium. He continues regular follow-up with the anti-coagulation clinic. His lipids remain well controlled. His family remains a good support for lifestyle changes. Patient will benefit from participation in intensive cardiac rehab for nutrition, exercise, and lifestyle modification.      Intervention Plan   Intervention Prescribe, educate and counsel regarding individualized specific dietary modifications aiming  towards targeted core components such as weight, hypertension, lipid management, diabetes, heart failure and other comorbidities.;Nutrition handout(s) given to patient.    Expected Outcomes Long Term Goal: Adherence to prescribed nutrition plan.;Short Term Goal: Understand basic principles of dietary content, such as calories, fat, sodium, cholesterol and nutrients.             Nutrition Assessments:  Nutrition Assessments - 08/12/23 1040       Rate Your Plate Scores   Pre Score 44            MEDIFICTS Score Key: >=70 Need to make dietary changes  40-70 Heart Healthy Diet <= 40 Therapeutic Level Cholesterol Diet   Flowsheet Row INTENSIVE CARDIAC REHAB from 08/11/2023 in [facility]  Picture Your Plate Total Score on Admission 44      Picture Your Plate Scores: <40 Unhealthy dietary pattern with much room for improvement. 41-50 Dietary pattern unlikely to  meet recommendations for good health and room for improvement. 51-60 More healthful dietary pattern, with some room for improvement.  >60 Healthy dietary pattern, although there may be some specific behaviors that could be improved.    Nutrition Goals Re-Evaluation:  Nutrition Goals Re-Evaluation     Row Name 08/11/23 1619 09/06/23 1112           Goals   Current Weight 228 lb 6.3 oz (103.6 kg) 231 lb 14.8 oz (105.2 kg)      Comment Cr 1.37, lipids WNL, LDL 59, A1c WNL no new labs; most recent labs  Cr 1.37, lipids WNL, LDL 59, A1c WNL. He continues follow-up with anti-coag clinic.      Expected Outcome Patient has medical history of aortic aneurysm repair, HTN, CKD, Afib, obesity, OSA, CHF. He continues regular follow-up with the anti-coagulation clinic. His lipids remain well controlled. Patient will benefit from participation in intensive cardiac rehab for nutrition, exercise, and lifestyle modification. Goals in action. Patient has medical history of aortic  aneurysm repair, HTN, CKD, Afib, obesity, OSA, CHF. He continues to attend the [facility] education and nutrition series. He has started making many dietary changes including reduced saturated fat intake, increased dietary fiber, and reading food labels for sodium. He continues regular follow-up with the anti-coagulation clinic. His lipids remain well controlled. His family remains a good support for lifestyle changes. Patient will benefit from participation in intensive cardiac rehab for nutrition, exercise, and lifestyle modification.               Nutrition Goals Re-Evaluation:  Nutrition Goals Re-Evaluation     Row Name 08/11/23 1619 09/06/23 1112           Goals   Current Weight 228 lb 6.3 oz (103.6 kg) 231 lb 14.8 oz (105.2 kg)      Comment Cr 1.37, lipids WNL, LDL 59, A1c WNL no new labs; most recent labs  Cr 1.37, lipids WNL, LDL 59, A1c WNL. He continues follow-up with anti-coag clinic.      Expected Outcome Patient has medical history of aortic aneurysm repair, HTN, CKD, Afib, obesity, OSA, CHF. He continues regular follow-up with the anti-coagulation clinic. His lipids remain well controlled. Patient will benefit from participation in intensive cardiac rehab for nutrition, exercise, and lifestyle modification. Goals in action. Patient has medical history of aortic aneurysm repair, HTN, CKD, Afib, obesity, OSA, CHF. He continues to attend the [facility] education and nutrition series. He has started making many dietary changes including reduced saturated fat intake, increased dietary fiber, and reading food labels for sodium. He continues regular follow-up with the anti-coagulation clinic. His lipids remain well controlled. His family remains a good support for lifestyle changes. Patient will benefit from participation in intensive cardiac rehab for nutrition, exercise, and lifestyle modification.               Nutrition Goals Discharge (Final Nutrition Goals Re-Evaluation):   Nutrition Goals Re-Evaluation - 09/06/23 1112       Goals   Current Weight 231 lb 14.8 oz (105.2 kg)    Comment no new labs; most recent labs  Cr 1.37, lipids WNL, LDL 59, A1c WNL. He continues follow-up with anti-coag clinic.    Expected Outcome Goals in action. Patient has medical history of aortic aneurysm repair, HTN, CKD, Afib, obesity, OSA, CHF. He continues to attend the [facility] education and nutrition series. He has started making many dietary changes including reduced saturated fat intake, increased dietary fiber, and reading food  labels for sodium. He continues regular follow-up with the anti-coagulation clinic. His lipids remain well controlled. His family remains a good support for lifestyle changes. Patient will benefit from participation in intensive cardiac rehab for nutrition, exercise, and lifestyle modification.             Psychosocial: Target Goals: Acknowledge presence or absence of significant depression and/or stress, maximize coping skills, provide positive support system. Participant is able to verbalize types and ability to use techniques and skills needed for reducing stress and depression.  Initial Review & Psychosocial Screening:  Initial Psych Review & Screening - 08/03/23 1101       Initial Review   Current issues with Current Stress Concerns    Source of Stress Concerns Unable to participate in former interests or hobbies    Comments Patient started therapy with a counselor at [facility] on 07/29/23.      Family Dynamics   Good Support System? Yes    Comments Patient doesn't talk to friends or family when he's stressed but recently started talking to a counselor.      Barriers   Psychosocial barriers to participate in program The patient should benefit from training in stress management and relaxation.;Psychosocial barriers identified (see note)      Screening Interventions   Interventions Encouraged to exercise;Provide feedback about the scores to  participant;To provide support and resources with identified psychosocial needs    Expected Outcomes Short Term goal: Utilizing psychosocial counselor, staff and physician to assist with identification of specific Stressors or current issues interfering with healing process. Setting desired goal for each stressor or current issue identified.;Long Term Goal: Stressors or current issues are controlled or eliminated.;Short Term goal: Identification and review with participant of any Quality of Life or Depression concerns found by scoring the questionnaire.;Long Term goal: The participant improves quality of Life and PHQ9 Scores as seen by post scores and/or verbalization of changes             Quality of Life Scores:  Quality of Life - 08/03/23 1107       Quality of Life   Select Quality of Life      Quality of Life Scores   Health/Function Pre 16.1 %    Socioeconomic Pre 20.75 %    Psych/Spiritual Pre 17 %    Family Pre 26.4 %    GLOBAL Pre 18.75 %            Scores of 19 and below usually indicate a poorer quality of life in these areas.  A difference of  2-3 points is a clinically meaningful difference.  A difference of 2-3 points in the total score of the Quality of Life Index has been associated with significant improvement in overall quality of life, self-image, physical symptoms, and general health in studies assessing change in quality of life.  PHQ-9: Review Flowsheet  More data exists      08/11/2023 08/03/2023 06/02/2023 04/20/2023 03/25/2023  Depression screen PHQ 2/9  Decreased Interest 1 2 0 0 0  Down, Depressed, Hopeless 1 2 0 0 0  PHQ - 2 Score 2 4 0 0 0  Altered sleeping 2 1 0 1 -  Tired, decreased energy 2 2 0 1 -  Change in appetite 0 0 0 0 -  Feeling bad or failure about yourself  2 1 0 0 -  Trouble concentrating 1 0 - - -  Moving slowly or fidgety/restless 0 2 0 0 -  Suicidal thoughts  0 0 0 0 -  PHQ-9 Score 9 10 0 2 -  Difficult doing work/chores Somewhat  difficult Somewhat difficult Not difficult at all Not difficult at all -    Details           Interpretation of Total Score  Total Score Depression Severity:  1-4 = Minimal depression, 5-9 = Mild depression, 10-14 = Moderate depression, 15-19 = Moderately severe depression, 20-27 = Severe depression   Psychosocial Evaluation and Intervention:   Psychosocial Re-Evaluation:  Psychosocial Re-Evaluation     Row Name 08/11/23 1159 09/02/23 1320 09/29/23 0827         Psychosocial Re-Evaluation   Current issues with Current Stress Concerns Current Stress Concerns Current Stress Concerns     Comments Timothy Browning did not voice any concerns or stressors on his first day of exercise, Timothy Browning has not voiced any  concerns or stressors  during exercise at cardiac rehab. Timothy Browning says he feels a little stronger since participating in the program Timothy Browning voiced stress concerns regarding health. Exercise is currently on hold.     Expected Outcomes -- Timothy Browning will have decreased or controlled stress upon completion of cardiac rehab Timothy Browning will have decreased or controlled stress upon completion of cardiac rehab     Interventions Stress management education;Encouraged to attend Cardiac Rehabilitation for the exercise;Relaxation education Stress management education;Encouraged to attend Cardiac Rehabilitation for the exercise;Relaxation education Stress management education;Encouraged to attend Cardiac Rehabilitation for the exercise;Relaxation education     Continue Psychosocial Services  Follow up required by staff Follow up required by staff Follow up required by staff       Initial Review   Source of Stress Concerns Unable to perform yard/household activities;Unable to participate in former interests or hobbies Unable to perform yard/household activities;Unable to participate in former interests or hobbies Unable to perform yard/household activities;Unable to participate in former interests or hobbies     Comments  will continue to monitor  and offer support as needed will continue to monitor  and offer support as needed will continue to monitor  and offer support as needed              Psychosocial Discharge (Final Psychosocial Re-Evaluation):  Psychosocial Re-Evaluation - 09/29/23 0827       Psychosocial Re-Evaluation   Current issues with Current Stress Concerns    Comments Timothy Browning voiced stress concerns regarding health. Exercise is currently on hold.    Expected Outcomes Timothy Browning will have decreased or controlled stress upon completion of cardiac rehab    Interventions Stress management education;Encouraged to attend Cardiac Rehabilitation for the exercise;Relaxation education    Continue Psychosocial Services  Follow up required by staff      Initial Review   Source of Stress Concerns Unable to perform yard/household activities;Unable to participate in former interests or hobbies    Comments will continue to monitor  and offer support as needed             Vocational Rehabilitation: Provide vocational rehab assistance to qualifying candidates.   Vocational Rehab Evaluation & Intervention:  Vocational Rehab - 08/03/23 1127       Initial Vocational Rehab Evaluation & Intervention   Assessment shows need for Vocational Rehabilitation Yes    Vocational Rehab Packet given to patient 08/03/23      Vocational Rehab Re-Evaulation   Comments Anticipating knee surgery but may be interested in vocational rehab services in the future.  Education: Education Goals: Education classes will be provided on a weekly basis, covering required topics. Participant will state understanding/return demonstration of topics presented.    Education     Row Name 08/11/23 1500     Education   Cardiac Education Topics Pritikin   Customer service manager   Weekly Topic International Cuisine- Spotlight on the [employer] Zones   Instruction Review Code 1-  Verbalizes Understanding   Class Start Time 1145   Class Stop Time 1221   Class Time Calculation (min) 36 min    Row Name 08/25/23 1500     Education   Cardiac Education Topics Pritikin   Customer service manager   Weekly Topic Powerhouse Plant-Based Proteins   Instruction Review Code 1- Verbalizes Understanding   Class Start Time 1145   Class Stop Time 1222   Class Time Calculation (min) 37 min    Row Name 09/01/23 1300     Education   Cardiac Education Topics Pritikin   Customer service manager   Weekly Topic Adding Flavor - Sodium-Free   Instruction Review Code 1- Verbalizes Understanding   Class Start Time 1140   Class Stop Time 1220   Class Time Calculation (min) 40 min            Core Videos: Exercise    Move It!  Clinical staff conducted group or individual video education with verbal and written material and guidebook.  Patient learns the recommended Pritikin exercise program. Exercise with the goal of living a long, healthy life. Some of the health benefits of exercise include controlled diabetes, healthier blood pressure levels, improved cholesterol levels, improved heart and lung capacity, improved sleep, and better body composition. Everyone should speak with their doctor before starting or changing an exercise routine.  Biomechanical Limitations Clinical staff conducted group or individual video education with verbal and written material and guidebook.  Patient learns how biomechanical limitations can impact exercise and how we can mitigate and possibly overcome limitations to have an impactful and balanced exercise routine.  Body Composition Clinical staff conducted group or individual video education with verbal and written material and guidebook.  Patient learns that body composition (ratio of muscle mass to fat mass) is a key component to assessing overall fitness, rather than  body weight alone. Increased fat mass, especially visceral belly fat, can put [place] at increased risk for metabolic syndrome, type 2 diabetes, heart disease, and even death. It is recommended to combine diet and exercise (cardiovascular and resistance training) to improve your body composition. Seek guidance from your physician and exercise physiologist before implementing an exercise routine.  Exercise Action Plan Clinical staff conducted group or individual video education with verbal and written material and guidebook.  Patient learns the recommended strategies to achieve and enjoy long-term exercise adherence, including variety, self-motivation, self-efficacy, and positive decision making. Benefits of exercise include fitness, good health, weight management, more energy, better sleep, less stress, and overall well-being.  Medical   Heart Disease Risk Reduction Clinical staff conducted group or individual video education with verbal and written material and guidebook.  Patient learns our heart is our most vital organ as it circulates oxygen, nutrients, white blood cells, and hormones throughout the entire body, and carries waste away. Data supports a plant-based eating plan like the Pritikin Program for its effectiveness in slowing progression  of and reversing heart disease. The video provides a number of recommendations to address heart disease.   Metabolic Syndrome and Belly Fat  Clinical staff conducted group or individual video education with verbal and written material and guidebook.  Patient learns what metabolic syndrome is, how it leads to heart disease, and how one can reverse it and keep it from coming back. You have metabolic syndrome if you have 3 of the following 5 criteria: abdominal obesity, high blood pressure, high triglycerides, low HDL cholesterol, and high blood sugar.  Hypertension and Heart Disease Clinical staff conducted group or individual video education with verbal and  written material and guidebook.  Patient learns that high blood pressure, or hypertension, is very common in the [place]. Hypertension is largely due to excessive salt intake, but other important risk factors include being overweight, physical inactivity, drinking too much alcohol, smoking, and not eating enough potassium from fruits and vegetables. High blood pressure is a leading risk factor for heart attack, stroke, congestive heart failure, dementia, kidney failure, and premature death. Long-term effects of excessive salt intake include stiffening of the arteries and thickening of heart muscle and organ damage. Recommendations include ways to reduce hypertension and the risk of heart disease.  Diseases of Our Time - Focusing on Diabetes Clinical staff conducted group or individual video education with verbal and written material and guidebook.  Patient learns why the best way to stop diseases of our time is prevention, through food and other lifestyle changes. Medicine (such as prescription pills and surgeries) is often only a Band-Aid on the problem, not a long-term solution. Most common diseases of our time include obesity, type 2 diabetes, hypertension, heart disease, and cancer. The Pritikin Program is recommended and has been proven to help reduce, reverse, and/or prevent the damaging effects of metabolic syndrome.  Nutrition   Overview of the Pritikin Eating Plan  Clinical staff conducted group or individual video education with verbal and written material and guidebook.  Patient learns about the Pritikin Eating Plan for disease risk reduction. The Pritikin Eating Plan emphasizes a wide variety of unrefined, minimally-processed carbohydrates, like fruits, vegetables, whole grains, and legumes. Go, Caution, and Stop food choices are explained. Plant-based and lean animal proteins are emphasized. Rationale provided for low sodium intake for blood pressure control, low added sugars for blood  sugar stabilization, and low added fats and oils for coronary artery disease risk reduction and weight management.  Calorie Density  Clinical staff conducted group or individual video education with verbal and written material and guidebook.  Patient learns about calorie density and how it impacts the Pritikin Eating Plan. Knowing the characteristics of the food you choose will help you decide whether those foods will lead to weight gain or weight loss, and whether you want to consume more or less of them. Weight loss is usually a side effect of the Pritikin Eating Plan because of its focus on low calorie-dense foods.  Label Reading  Clinical staff conducted group or individual video education with verbal and written material and guidebook.  Patient learns about the Pritikin recommended label reading guidelines and corresponding recommendations regarding calorie density, added sugars, sodium content, and whole grains.  Dining Out - Part 1  Clinical staff conducted group or individual video education with verbal and written material and guidebook.  Patient learns that restaurant meals can be sabotaging because they can be so high in calories, fat, sodium, and/or sugar. Patient learns recommended strategies on how to positively address this  and avoid unhealthy pitfalls.  Facts on Fats  Clinical staff conducted group or individual video education with verbal and written material and guidebook.  Patient learns that lifestyle modifications can be just as effective, if not more so, as many medications for lowering your risk of heart disease. A Pritikin lifestyle can help to reduce your risk of inflammation and atherosclerosis (cholesterol build-up, or plaque, in the artery walls). Lifestyle interventions such as dietary choices and physical activity address the cause of atherosclerosis. A review of the types of fats and their impact on blood cholesterol levels, along with dietary recommendations to reduce  fat intake is also included.  Nutrition Action Plan  Clinical staff conducted group or individual video education with verbal and written material and guidebook.  Patient learns how to incorporate Pritikin recommendations into their lifestyle. Recommendations include planning and keeping personal health goals in mind as an important part of their success.  Healthy Mind-Set    Healthy Minds, Bodies, Hearts  Clinical staff conducted group or individual video education with verbal and written material and guidebook.  Patient learns how to identify when they are stressed. Video will discuss the impact of that stress, as well as the many benefits of stress management. Patient will also be introduced to stress management techniques. The way we think, act, and feel has an impact on our hearts.  How Our Thoughts Can Heal Our Hearts  Clinical staff conducted group or individual video education with verbal and written material and guidebook.  Patient learns that negative thoughts can cause depression and anxiety. This can result in negative lifestyle behavior and serious health problems. Cognitive behavioral therapy is an effective method to help control our thoughts in order to change and improve our emotional outlook.  Additional Videos:  Exercise    Improving Performance  Clinical staff conducted group or individual video education with verbal and written material and guidebook.  Patient learns to use a non-linear approach by alternating intensity levels and lengths of time spent exercising to help burn more calories and lose more body fat. Cardiovascular exercise helps improve heart health, metabolism, hormonal balance, blood sugar control, and recovery from fatigue. Resistance training improves strength, endurance, balance, coordination, reaction time, metabolism, and muscle mass. Flexibility exercise improves circulation, posture, and balance. Seek guidance from your physician and exercise  physiologist before implementing an exercise routine and learn your capabilities and proper form for all exercise.  Introduction to Yoga  Clinical staff conducted group or individual video education with verbal and written material and guidebook.  Patient learns about yoga, a discipline of the coming together of mind, breath, and body. The benefits of yoga include improved flexibility, improved range of motion, better posture and core strength, increased lung function, weight loss, and positive self-image. Yoga's heart health benefits include lowered blood pressure, healthier heart rate, decreased cholesterol and triglyceride levels, improved immune function, and reduced stress. Seek guidance from your physician and exercise physiologist before implementing an exercise routine and learn your capabilities and proper form for all exercise.  Medical   Aging: Enhancing Your Quality of Life  Clinical staff conducted group or individual video education with verbal and written material and guidebook.  Patient learns key strategies and recommendations to stay in good physical health and enhance quality of life, such as prevention strategies, having an advocate, securing a Health Care Proxy and Power of Attorney, and keeping a list of medications and system for tracking them. It also discusses how to avoid risk for bone loss.  Biology of Weight Control  Clinical staff conducted group or individual video education with verbal and written material and guidebook.  Patient learns that weight gain occurs because we consume more calories than we burn (eating more, moving less). Even if your body weight is normal, you may have higher ratios of fat compared to muscle mass. Too much body fat puts you at increased risk for cardiovascular disease, heart attack, stroke, type 2 diabetes, and obesity-related cancers. In addition to exercise, following the Pritikin Eating Plan can help reduce your risk.  Decoding Lab Results   Clinical staff conducted group or individual video education with verbal and written material and guidebook.  Patient learns that lab test reflects one measurement whose values change over time and are influenced by many factors, including medication, stress, sleep, exercise, food, hydration, pre-existing medical conditions, and more. It is recommended to use the knowledge from this video to become more involved with your lab results and evaluate your numbers to speak with your doctor.   Diseases of Our Time - Overview  Clinical staff conducted group or individual video education with verbal and written material and guidebook.  Patient learns that according to the CDC, 50% to 70% of chronic diseases (such as obesity, type 2 diabetes, elevated lipids, hypertension, and heart disease) are avoidable through lifestyle improvements including healthier food choices, listening to satiety cues, and increased physical activity.  Sleep Disorders Clinical staff conducted group or individual video education with verbal and written material and guidebook.  Patient learns how good quality and duration of sleep are important to overall health and well-being. Patient also learns about sleep disorders and how they impact health along with recommendations to address them, including discussing with a physician.  Nutrition  Dining Out - Part 2 Clinical staff conducted group or individual video education with verbal and written material and guidebook.  Patient learns how to plan ahead and communicate in order to maximize their dining experience in a healthy and nutritious manner. Included are recommended food choices based on the type of restaurant the patient is visiting.   Fueling a Banker conducted group or individual video education with verbal and written material and guidebook.  There is a strong connection between our food choices and our health. Diseases like obesity and type 2 diabetes  are very prevalent and are in large-part due to lifestyle choices. The Pritikin Eating Plan provides plenty of food and hunger-curbing satisfaction. It is easy to follow, affordable, and helps reduce health risks.  Menu Workshop  Clinical staff conducted group or individual video education with verbal and written material and guidebook.  Patient learns that restaurant meals can sabotage health goals because they are often packed with calories, fat, sodium, and sugar. Recommendations include strategies to plan ahead and to communicate with the manager, chef, or server to help order a healthier meal.  Planning Your Eating Strategy  Clinical staff conducted group or individual video education with verbal and written material and guidebook.  Patient learns about the Pritikin Eating Plan and its benefit of reducing the risk of disease. The Pritikin Eating Plan does not focus on calories. Instead, it emphasizes high-quality, nutrient-rich foods. By knowing the characteristics of the foods, we choose, we can determine their calorie density and make informed decisions.  Targeting Your Nutrition Priorities  Clinical staff conducted group or individual video education with verbal and written material and guidebook.  Patient learns that lifestyle habits have a tremendous impact on disease risk and  progression. This video provides eating and physical activity recommendations based on your personal health goals, such as reducing LDL cholesterol, losing weight, preventing or controlling type 2 diabetes, and reducing high blood pressure.  Vitamins and Minerals  Clinical staff conducted group or individual video education with verbal and written material and guidebook.  Patient learns different ways to obtain key vitamins and minerals, including through a recommended healthy diet. It is important to discuss all supplements you take with your doctor.   Healthy Mind-Set    Smoking Cessation  Clinical staff  conducted group or individual video education with verbal and written material and guidebook.  Patient learns that cigarette smoking and tobacco addiction pose a serious health risk which affects millions of people. Stopping smoking will significantly reduce the risk of heart disease, lung disease, and many forms of cancer. Recommended strategies for quitting are covered, including working with your doctor to develop a successful plan.  Culinary   Becoming a Set designer conducted group or individual video education with verbal and written material and guidebook.  Patient learns that cooking at home can be healthy, cost-effective, quick, and puts them in control. Keys to cooking healthy recipes will include looking at your recipe, assessing your equipment needs, planning ahead, making it simple, choosing cost-effective seasonal ingredients, and limiting the use of added fats, salts, and sugars.  Cooking - Breakfast and Snacks  Clinical staff conducted group or individual video education with verbal and written material and guidebook.  Patient learns how important breakfast is to satiety and nutrition through the entire day. Recommendations include key foods to eat during breakfast to help stabilize blood sugar levels and to prevent overeating at meals later in the day. Planning ahead is also a key component.  Cooking - Educational psychologist conducted group or individual video education with verbal and written material and guidebook.  Patient learns eating strategies to improve overall health, including an approach to cook more at home. Recommendations include thinking of animal protein as a side on your plate rather than center stage and focusing instead on lower calorie dense options like vegetables, fruits, whole grains, and plant-based proteins, such as beans. Making sauces in large quantities to freeze for later and leaving the skin on your vegetables are also  recommended to maximize your experience.  Cooking - Healthy Salads and Dressing Clinical staff conducted group or individual video education with verbal and written material and guidebook.  Patient learns that vegetables, fruits, whole grains, and legumes are the foundations of the Pritikin Eating Plan. Recommendations include how to incorporate each of these in flavorful and healthy salads, and how to create homemade salad dressings. Proper handling of ingredients is also covered. Cooking - Soups and [employer] - Soups and Desserts Clinical staff conducted group or individual video education with verbal and written material and guidebook.  Patient learns that Pritikin soups and desserts make for easy, nutritious, and delicious snacks and meal components that are low in sodium, fat, sugar, and calorie density, while high in vitamins, minerals, and filling fiber. Recommendations include simple and healthy ideas for soups and desserts.   Overview     The Pritikin Solution Program Overview Clinical staff conducted group or individual video education with verbal and written material and guidebook.  Patient learns that the results of the Pritikin Program have been documented in more than 100 articles published in peer-reviewed journals, and the benefits include reducing risk factors for (and, in some  cases, even reversing) high cholesterol, high blood pressure, type 2 diabetes, obesity, and more! An overview of the three key pillars of the Pritikin Program will be covered: eating well, doing regular exercise, and having a healthy mind-set.  WORKSHOPS  Exercise: Exercise Basics: Building Your Action Plan Clinical staff led group instruction and group discussion with PowerPoint presentation and patient guidebook. To enhance the learning environment the use of posters, models and videos may be added. At the conclusion of this workshop, patients will comprehend the difference between physical  activity and exercise, as well as the benefits of incorporating both, into their routine. Patients will understand the FITT (Frequency, Intensity, Time, and Type) principle and how to use it to build an exercise action plan. In addition, safety concerns and other considerations for exercise and cardiac rehab will be addressed by the presenter. The purpose of this lesson is to promote a comprehensive and effective weekly exercise routine in order to improve patients' overall level of fitness.   Managing Heart Disease: Your Path to a Healthier Heart Clinical staff led group instruction and group discussion with PowerPoint presentation and patient guidebook. To enhance the learning environment the use of posters, models and videos may be added.At the conclusion of this workshop, patients will understand the anatomy and physiology of the heart. Additionally, they will understand how Pritikin's three pillars impact the risk factors, the progression, and the management of heart disease.  The purpose of this lesson is to provide a high-level overview of the heart, heart disease, and how the Pritikin lifestyle positively impacts risk factors.  Exercise Biomechanics Clinical staff led group instruction and group discussion with PowerPoint presentation and patient guidebook. To enhance the learning environment the use of posters, models and videos may be added. Patients will learn how the structural parts of their bodies function and how these functions impact their daily activities, movement, and exercise. Patients will learn how to promote a neutral spine, learn how to manage pain, and identify ways to improve their physical movement in order to promote healthy living. The purpose of this lesson is to expose patients to common physical limitations that impact physical activity. Participants will learn practical ways to adapt and manage aches and pains, and to minimize their effect on regular exercise.  Patients will learn how to maintain good posture while sitting, walking, and lifting.  Balance Training and Fall Prevention  Clinical staff led group instruction and group discussion with PowerPoint presentation and patient guidebook. To enhance the learning environment the use of posters, models and videos may be added. At the conclusion of this workshop, patients will understand the importance of their sensorimotor skills (vision, proprioception, and the vestibular system) in maintaining their ability to balance as they age. Patients will apply a variety of balancing exercises that are appropriate for their current level of function. Patients will understand the common causes for poor balance, possible solutions to these problems, and ways to modify their physical environment in order to minimize their fall risk. The purpose of this lesson is to teach patients about the importance of maintaining balance as they age and ways to minimize their risk of falling.  WORKSHOPS   Nutrition:  Fueling a Ship broker led group instruction and group discussion with PowerPoint presentation and patient guidebook. To enhance the learning environment the use of posters, models and videos may be added. Patients will review the foundational principles of the Pritikin Eating Plan and understand what constitutes a serving size in each of  the food groups. Patients will also learn Pritikin-friendly foods that are better choices when away from home and review make-ahead meal and snack options. Calorie density will be reviewed and applied to three nutrition priorities: weight maintenance, weight loss, and weight gain. The purpose of this lesson is to reinforce (in a group setting) the key concepts around what patients are recommended to eat and how to apply these guidelines when away from home by planning and selecting Pritikin-friendly options. Patients will understand how calorie density may be adjusted for  different weight management goals.  Mindful Eating  Clinical staff led group instruction and group discussion with PowerPoint presentation and patient guidebook. To enhance the learning environment the use of posters, models and videos may be added. Patients will briefly review the concepts of the Pritikin Eating Plan and the importance of low-calorie dense foods. The concept of mindful eating will be introduced as well as the importance of paying attention to internal hunger signals. Triggers for non-hunger eating and techniques for dealing with triggers will be explored. The purpose of this lesson is to provide patients with the opportunity to review the basic principles of the Pritikin Eating Plan, discuss the value of eating mindfully and how to measure internal cues of hunger and fullness using the Hunger Scale. Patients will also discuss reasons for non-hunger eating and learn strategies to use for controlling emotional eating.  Targeting Your Nutrition Priorities Clinical staff led group instruction and group discussion with PowerPoint presentation and patient guidebook. To enhance the learning environment the use of posters, models and videos may be added. Patients will learn how to determine their genetic susceptibility to disease by reviewing their family history. Patients will gain insight into the importance of diet as part of an overall healthy lifestyle in mitigating the impact of genetics and other environmental insults. The purpose of this lesson is to provide patients with the opportunity to assess their personal nutrition priorities by looking at their family history, their own health history and current risk factors. Patients will also be able to discuss ways of prioritizing and modifying the Pritikin Eating Plan for their highest risk areas  Menu  Clinical staff led group instruction and group discussion with PowerPoint presentation and patient guidebook. To enhance the learning  environment the use of posters, models and videos may be added. Using menus brought in from [employer], or printed from [employer], patients will apply the Pritikin dining out guidelines that were presented in the [employer] video. Patients will also be able to practice these guidelines in a variety of provided scenarios. The purpose of this lesson is to provide patients with the opportunity to practice hands-on learning of the Pritikin Dining Out guidelines with actual menus and practice scenarios.  Label Reading Clinical staff led group instruction and group discussion with PowerPoint presentation and patient guidebook. To enhance the learning environment the use of posters, models and videos may be added. Patients will review and discuss the Pritikin label reading guidelines presented in Pritikin's Label Reading Educational series video. Using fool labels brought in from local grocery stores and markets, patients will apply the label reading guidelines and determine if the packaged food meet the Pritikin guidelines. The purpose of this lesson is to provide patients with the opportunity to review, discuss, and practice hands-on learning of the Pritikin Label Reading guidelines with actual packaged food labels. Cooking School  Pritikin's [employer] are designed to teach patients ways to prepare quick, simple, and affordable  recipes at home. The importance of nutrition's role in chronic disease risk reduction is reflected in its emphasis in the overall Pritikin program. By learning how to prepare essential core Pritikin Eating Plan recipes, patients will increase control over what they eat; be able to customize the flavor of foods without the use of added salt, sugar, or fat; and improve the quality of the food they consume. By learning a set of core recipes which are easily assembled, quickly prepared, and affordable, patients are more likely to prepare more healthy  foods at home. These workshops focus on convenient breakfasts, simple entres, side dishes, and desserts which can be prepared with minimal effort and are consistent with nutrition recommendations for cardiovascular risk reduction. Cooking [employer] are taught by a Armed forces logistics/support/administrative officer (RD) who has been trained by the [employer]. The chef or RD has a clear understanding of the importance of minimizing - if not completely eliminating - added fat, sugar, and sodium in recipes. Throughout the series of Cooking School Workshop sessions, patients will learn about healthy ingredients and efficient methods of cooking to build confidence in their capability to prepare    Cooking School weekly topics:  Adding Flavor- Sodium-Free  Fast and Healthy Breakfasts  Powerhouse Plant-Based Proteins  Satisfying Salads and Dressings  Simple Sides and Sauces  International Cuisine-Spotlight on the [employer] Zones  Delicious Desserts  Savory Soups  [employer] - Meals in a Astronomer Appetizers and Snacks  Comforting Weekend Breakfasts  One-Pot Wonders   Fast Evening Meals  Landscape architect Your Pritikin Plate  WORKSHOPS   Healthy Mindset (Psychosocial):  Focused Goals, Sustainable Changes Clinical staff led group instruction and group discussion with PowerPoint presentation and patient guidebook. To enhance the learning environment the use of posters, models and videos may be added. Patients will be able to apply effective goal setting strategies to establish at least one personal goal, and then take consistent, meaningful action toward that goal. They will learn to identify common barriers to achieving personal goals and develop strategies to overcome them. Patients will also gain an understanding of how our mind-set can impact our ability to achieve goals and the importance of cultivating a positive and growth-oriented mind-set. The purpose of this lesson is to  provide patients with a deeper understanding of how to set and achieve personal goals, as well as the tools and strategies needed to overcome common obstacles which may arise along the way.  From Head to Heart: The Power of a Healthy Outlook  Clinical staff led group instruction and group discussion with PowerPoint presentation and patient guidebook. To enhance the learning environment the use of posters, models and videos may be added. Patients will be able to recognize and describe the impact of emotions and mood on physical health. They will discover the importance of self-care and explore self-care practices which may work for them. Patients will also learn how to utilize the 4 C's to cultivate a healthier outlook and better manage stress and challenges. The purpose of this lesson is to demonstrate to patients how a healthy outlook is an essential part of maintaining good health, especially as they continue their cardiac rehab journey.  Healthy Sleep for a Healthy Heart Clinical staff led group instruction and group discussion with PowerPoint presentation and patient guidebook. To enhance the learning environment the use of posters, models and videos may be added. At the conclusion of this workshop, patients will be able to demonstrate knowledge  of the importance of sleep to overall health, well-being, and quality of life. They will understand the symptoms of, and treatments for, common sleep disorders. Patients will also be able to identify daytime and nighttime behaviors which impact sleep, and they will be able to apply these tools to help manage sleep-related challenges. The purpose of this lesson is to provide patients with a general overview of sleep and outline the importance of quality sleep. Patients will learn about a few of the most common sleep disorders. Patients will also be introduced to the concept of "sleep hygiene," and discover ways to self-manage certain sleeping problems through  simple daily behavior changes. Finally, the workshop will motivate patients by clarifying the links between quality sleep and their goals of heart-healthy living.   Recognizing and Reducing Stress Clinical staff led group instruction and group discussion with PowerPoint presentation and patient guidebook. To enhance the learning environment the use of posters, models and videos may be added. At the conclusion of this workshop, patients will be able to understand the types of stress reactions, differentiate between acute and chronic stress, and recognize the impact that chronic stress has on their health. They will also be able to apply different coping mechanisms, such as reframing negative self-talk. Patients will have the opportunity to practice a variety of stress management techniques, such as deep abdominal breathing, progressive muscle relaxation, and/or guided imagery.  The purpose of this lesson is to educate patients on the role of stress in their lives and to provide healthy techniques for coping with it.  Learning Barriers/Preferences:  Learning Barriers/Preferences - 08/03/23 1103       Learning Barriers/Preferences   Learning Barriers None    Learning Preferences Audio;Written Material;Pictoral             Education Topics:  Knowledge Questionnaire Score:  Knowledge Questionnaire Score - 08/03/23 1118       Knowledge Questionnaire Score   Pre Score 23/24             Core Components/Risk Factors/Patient Goals at Admission:  Personal Goals and Risk Factors at Admission - 08/03/23 1100       Core Components/Risk Factors/Patient Goals on Admission    Weight Management Yes;Obesity    Intervention Weight Management/Obesity: Establish reasonable short term and long term weight goals.;Obesity: Provide education and appropriate resources to help participant work on and attain dietary goals.    Admit Weight 227 lb 11.8 oz (103.3 kg)    Expected Outcomes Short Term:  Continue to assess and modify interventions until short term weight is achieved;Long Term: Adherence to nutrition and physical activity/exercise program aimed toward attainment of established weight goal;Weight Loss: Understanding of general recommendations for a balanced deficit meal plan, which promotes 1-2 lb weight loss per week and includes a negative energy balance of [phone] kcal/d    Hypertension Yes    Intervention Provide education on lifestyle modifcations including regular physical activity/exercise, weight management, moderate sodium restriction and increased consumption of fresh fruit, vegetables, and low fat dairy, alcohol moderation, and smoking cessation.;Monitor prescription use compliance.    Expected Outcomes Short Term: Continued assessment and intervention until BP is < 140/36mm HG in hypertensive participants. < 130/2mm HG in hypertensive participants with diabetes, heart failure or chronic kidney disease.;Long Term: Maintenance of blood pressure at goal levels.    Lipids Yes    Intervention Provide education and support for participant on nutrition & aerobic/resistive exercise along with prescribed medications to achieve LDL 70mg , HDL >40mg .  Expected Outcomes Short Term: Participant states understanding of desired cholesterol values and is compliant with medications prescribed. Participant is following exercise prescription and nutrition guidelines.;Long Term: Cholesterol controlled with medications as prescribed, with individualized exercise RX and with personalized nutrition plan. Value goals: LDL < 70mg , HDL > 40 mg.    Stress Yes    Intervention Offer individual and/or small group education and counseling on adjustment to heart disease, stress management and health-related lifestyle change. Teach and support self-help strategies.    Expected Outcomes Short Term: Participant demonstrates changes in health-related behavior, relaxation and other stress management skills,  ability to obtain effective social support, and compliance with psychotropic medications if prescribed.;Long Term: Emotional wellbeing is indicated by absence of clinically significant psychosocial distress or social isolation.    Personal Goal Other Yes    Personal Goal Be as active as he was before. Be able to eat heart healthy foods that taste good or enjoy the foods he's used to eating. Be able to live a normal life without worrying or feeling down.    Intervention Develop an exercise action plan that he can follow to build strength and endurance, so that he can resume his previous activity level. Develop a nutrition action plan incorporating heart healthy foods that taste good that he can incorporate into his meal planning. Develop techniques he can use to help cope with stress.    Expected Outcomes Gauge will be able to resume his previous activites. He will comply with exercise and nutrition recommendations to live a healthy lifestyle. He will be able to cope with stressors.             Core Components/Risk Factors/Patient Goals Review:   Goals and Risk Factor Review     Row Name 08/11/23 1201 09/02/23 1325 09/29/23 0831         Core Components/Risk Factors/Patient Goals Review   Personal Goals Review Weight Management/Obesity;Stress;Hypertension;Lipids Weight Management/Obesity;Stress;Hypertension;Lipids Weight Management/Obesity;Stress;Hypertension;Lipids     Review Timothy Browning started did well with exercise on 08/11/23. Vital signs were stable Timothy Browning is doing  well with exercise. Vital signs have been stable. Timothy Browning has gained 1.9 kg since starting the program. Timothy Browning is doing  well with exercise. Vital signs have been stable. Timothy Browning has gained 3.0  kg since starting the program. Exercise is currently on hold due to complaints of feeling lightheaded.     Expected Outcomes Timothy Browning will continue to participate in cardiac rehab for exercise nutrtion and lifestyle modications Timothy Browning will continue to  participate in cardiac rehab for exercise nutrtion and lifestyle modications Timothy Browning will continue to participate in cardiac rehab for exercise nutrtion and lifestyle modications              Core Components/Risk Factors/Patient Goals at Discharge (Final Review):   Goals and Risk Factor Review - 09/29/23 0831       Core Components/Risk Factors/Patient Goals Review   Personal Goals Review Weight Management/Obesity;Stress;Hypertension;Lipids    Review Timothy Browning is doing  well with exercise. Vital signs have been stable. Timothy Browning has gained 3.0  kg since starting the program. Exercise is currently on hold due to complaints of feeling lightheaded.    Expected Outcomes Timothy Browning will continue to participate in cardiac rehab for exercise nutrtion and lifestyle modications             ITP Comments:  ITP Comments     Row Name 08/03/23 1020 08/11/23 1158 09/02/23 1319 09/29/23 0826     ITP Comments Medical Director- Dr. Driscilla Moats, MD.  Introduction to [employer]  / Intensive Cardiac Rehab. Reviewed initial orientation folder with participant. 30 Day ITP Review. Timothy Browning started cardiac rehab on 08/11/23. Daelon did well with exercise. 30 Day ITP Review. Branston has good attendance and participation in cardiac rehab. Xzavior is off to a good start to exercise. 30 Day ITP Review. Okley has good attendance and participation in cardiac rehab. Hamzah reported feeling lightheaded on 09/27/23. Exercise is currently on hold until after MD follow up.             Comments: See ITP Comments

## 2023-09-30 ENCOUNTER — Telehealth: Payer: Self-pay | Admitting: Cardiology

## 2023-09-30 NOTE — Telephone Encounter (Signed)
Pt c/o medication issue:  1. Name of Medication: Meclizinei  2. How are you currently taking this medication (dosage and times per day)?   3. Are you having a reaction (difficulty breathing--STAT)?   4. What is your medication issue? Patient says this medicine causes him to sweat and pulse rate  was high 103 and 92

## 2023-09-30 NOTE — Telephone Encounter (Signed)
Patient identification verified by 2 forms. Marilynn Rail, RN    Called and spoke to patient  Patient states:   -20 minutes after taking Meclizine he started sweating and felt jittery   -at 1:33pm shortly after taking Meclizine HR 103 BP: 156/80  -at 2:10 124/75 HR 80BPM   -sweating and jitteriness are resolving   -Rx was given in ED due to vertigo   -took Rx yesterday and had no reaction, unsure why reaction occurred today   -last time he had symptoms like this was 06/2023 during ED visit for GI bleed   -took all regular daily medications at 11am, took Meclizine at 2pm  -has appointment on 10/04/23 Patient denies:   -chest pain   -SOB/difficulty breathing  Advised patient:   -continue to monitor symptoms   -keep 12/16 OV   -message sent to pharmacy for input/advisement  Reviewed ED warning signs/precautions  Patient verbalized understanding, no questions at this time

## 2023-09-30 NOTE — Telephone Encounter (Signed)
No interactions with his other meds

## 2023-09-30 NOTE — Telephone Encounter (Signed)
Meclizine is an antihistamine. It usually causes drowsiness not jittery and it doesn't effect blood pressure.

## 2023-09-30 NOTE — Telephone Encounter (Signed)
Patient identification verified by 2 forms. Timothy Rail, RN    Called and spoke to patient  Relayed provider message below  Patient states jitteriness and sweating have completely resolved  Advised patient to continue monitor symptoms  Reviewed ED warning signs/precautions  Patient verbalized understanding, no questions at this time

## 2023-10-01 ENCOUNTER — Encounter (HOSPITAL_COMMUNITY): Payer: Medicaid Other

## 2023-10-04 ENCOUNTER — Encounter (HOSPITAL_BASED_OUTPATIENT_CLINIC_OR_DEPARTMENT_OTHER): Payer: Self-pay | Admitting: Family

## 2023-10-04 ENCOUNTER — Encounter (HOSPITAL_COMMUNITY): Payer: Medicaid Other

## 2023-10-04 ENCOUNTER — Ambulatory Visit (HOSPITAL_BASED_OUTPATIENT_CLINIC_OR_DEPARTMENT_OTHER): Payer: Medicaid Other | Admitting: Family

## 2023-10-04 VITALS — BP 124/72 | HR 76 | Ht 72.0 in | Wt 236.3 lb

## 2023-10-04 DIAGNOSIS — D6859 Other primary thrombophilia: Secondary | ICD-10-CM

## 2023-10-04 DIAGNOSIS — Z952 Presence of prosthetic heart valve: Secondary | ICD-10-CM | POA: Diagnosis not present

## 2023-10-04 DIAGNOSIS — I480 Paroxysmal atrial fibrillation: Secondary | ICD-10-CM

## 2023-10-04 DIAGNOSIS — R42 Dizziness and giddiness: Secondary | ICD-10-CM | POA: Diagnosis not present

## 2023-10-04 NOTE — Progress Notes (Signed)
Cardiology Office Note:  .   Date:  10/04/2023  ID:  [name], DOB 04-Jul-1971, MRN [id number] PCP: Marrianne Mood, MD  [facility] Providers Cardiologist:  Thomasene Ripple, DO    History of Present Illness: [name]   Timothy Browning is a 52 y.o. 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  He was last seen by Dr. Servando Salina 07/22/2023 with improvement in hemoglobin since hospitalization for diverticular bleed.  He was stable in sinus rhythm on amiodarone.  Amiodarone was discontinued due to concern for potential long-term side effects with plan monitor for A-fib recurrence with a 2-week monitor at 69-month mark post amiodarone.  ED visit 09/28/2023 for dizziness with position changes consistent with vertigo.  He was provided as needed meclizine.  Labs with improved hemoglobin 13 .7, creatinine 1.51, GFR 56 slightly decreased from prior.  Discussed the use of AI scribe software for clinical note transcription with the patient, who gave verbal consent to proceed.  History of Present Illness   Presents with a chief complaint of dizziness that started about two weeks ago. The dizziness is described as a feeling of the room moving and is associated  sometimes with a racing heart. Otherwise heart rate at home in the 70s. No hypotension at home. Endorses regular meals, stays well hydrated and avoids caffeine.  The patient reports that the dizziness would come and go, sometimes worse than others. Did take a couple doses of PRN Meclizine. However, had single episode of sweating and was concerned it was side effect of Meclizine, reassurance provided. Cardiac rehab presently on hold until he gets clearance to return. The patient also reports occasional  shortness of breath, especially after eating. Encouraged smaller meals. No exertional dyspnea, chest pain, edema, orthopnea, PND, near syncope, syncope.       ROS: Please see the history of present illness.    All other systems reviewed and are negative.   Studies Reviewed: .        Cardiac Studies & Procedures   CARDIAC CATHETERIZATION  CARDIAC CATHETERIZATION 07/15/2022  Narrative 1.  Normal coronary arteries. 2.  Left ventricular angiography was not performed.  EF was mildly reduced by echo. 3.  Mildly elevated left ventricular end-diastolic pressure at 17 mmHg. 4.  Difficult catheterization via the right radial artery due to significant tortuosity of the innominate artery as well as ascending aortic aneurysm.  Recommendations: Chest pain is not due to obstructive coronary artery disease.  Continue monitoring of ascending aortic aneurysm.  Findings Coronary Findings Diagnostic  Dominance: Right  Left Main Vessel is angiographically normal.  Ramus Intermedius Vessel is angiographically normal.  Left Circumflex Vessel is angiographically normal.  First Obtuse Marginal Branch Vessel is angiographically normal.  Second Obtuse Marginal Branch Vessel is angiographically normal.  Third Obtuse Marginal Branch Vessel is angiographically normal.  Fourth Obtuse Marginal Branch Vessel is angiographically normal.  Right Coronary Artery Vessel is angiographically normal.  Right Posterior Descending Artery Vessel is angiographically normal.  Right Posterior Atrioventricular Artery Vessel is angiographically normal.  First Right Posterolateral Branch Vessel is angiographically normal.  Second Right Posterolateral Branch Vessel is angiographically normal.  Intervention  No interventions have been documented.    ECHOCARDIOGRAM  ECHOCARDIOGRAM COMPLETE 05/04/2023  Narrative ECHOCARDIOGRAM REPORT    Patient Name:   JATAVIUS MANCHEGO  Date of Exam:  05/04/2023 Medical Rec #:  [id number]       Height:       72.0 in Accession #:    [id number]      Weight:       237.0 lb Date of Birth:  1971-03-14       BSA:          2.290 m Patient Age:    52 years        BP:           118/80 mmHg Patient Gender: M               HR:           60 bpm. Exam Location:  Outpatient  Procedure: 2D Echo, Cardiac Doppler and Color Doppler  Indications:    Aneurysm of ascending aorta without rupture (HCC) [I71.21 (ICD-10-CM)]  History:        Patient has prior history of Echocardiogram examinations, most recent 07/14/2022. CHF, CKD3; Risk Factors:Sleep Apnea and Non-Smoker.  Sonographer:    Dondra Prader RVT RCS Referring Phys: Eliezer Lofts LIGHTFOOT  IMPRESSIONS   1. Left ventricular ejection fraction, by estimation, is 55 to 60%. The left ventricle has normal function. The left ventricle has no regional wall motion abnormalities. There is mild left ventricular hypertrophy of the basal-septal segment. Left ventricular diastolic parameters were normal. 2. Right ventricular systolic function is normal. The right ventricular size is normal. Tricuspid regurgitation signal is inadequate for assessing PA pressure. 3. The mitral valve is normal in structure. No evidence of mitral valve regurgitation. 4. The aortic valve is tricuspid. There is mild calcification of the aortic valve. There is mild thickening of the aortic valve. Aortic valve regurgitation is trivial. Aortic valve sclerosis/calcification is present, without any evidence of aortic stenosis. 5. Aortic dilatation noted. There is severe dilatation of the aortic root, measuring 52 mm. There is moderate dilatation of the ascending aorta, measuring 45 mm. 6. The inferior vena cava is normal in size with greater than 50% respiratory variability, suggesting right atrial pressure of 3 mmHg.  Comparison(s): Prior images reviewed side by side. The left ventricular function has improved. The ascending aorta appears  unchanged.  FINDINGS Left Ventricle: Left ventricular ejection fraction, by estimation, is 55 to 60%. The left ventricle has normal function. The left ventricle has no regional wall motion abnormalities. The left ventricular internal cavity size was normal in size. There is mild left ventricular hypertrophy of the basal-septal segment. Left ventricular diastolic parameters were normal. Normal left ventricular filling pressure.  Right Ventricle: The right ventricular size is normal. No increase in right ventricular wall thickness. Right ventricular systolic function is normal. Tricuspid regurgitation signal is inadequate for assessing PA pressure.  Left Atrium: Left atrial size was normal in size.  Right Atrium: Right atrial size was normal in size.  Pericardium: There is no evidence of pericardial effusion.  Mitral Valve: The mitral valve is normal in structure. No evidence of mitral valve regurgitation.  Tricuspid Valve: The tricuspid valve is normal in structure. Tricuspid valve regurgitation is not demonstrated.  Aortic Valve: The aortic valve is tricuspid. There is mild calcification of the aortic valve. There is mild thickening of the aortic valve. Aortic valve regurgitation is trivial. Aortic valve sclerosis/calcification is present, without any evidence of aortic stenosis. Aortic valve mean gradient measures 1.0 mmHg. Aortic valve peak gradient measures 2.0 mmHg. Aortic valve area, by VTI measures 4.32 cm.  Pulmonic Valve: The pulmonic valve  was grossly normal. Pulmonic valve regurgitation is not visualized.  Aorta: Aortic dilatation noted. There is severe dilatation of the aortic root, measuring 52 mm. There is moderate dilatation of the ascending aorta, measuring 45 mm.  Venous: The inferior vena cava is normal in size with greater than 50% respiratory variability, suggesting right atrial pressure of 3 mmHg.  IAS/Shunts: No atrial level shunt detected by color flow  Doppler.   LEFT VENTRICLE PLAX 2D LVIDd:         4.70 cm   Diastology LVIDs:         3.00 cm   LV e' medial:    7.18 cm/s LV PW:         1.10 cm   LV E/e' medial:  9.3 LV IVS:        1.24 cm   LV e' lateral:   6.64 cm/s LVOT diam:     2.30 cm   LV E/e' lateral: 10.0 LV SV:         67 LV SV Index:   29 LVOT Area:     4.15 cm   IVC IVC diam: 1.80 cm  LEFT ATRIUM             Index        RIGHT ATRIUM          Index LA diam:        3.40 cm 1.48 cm/m   RA Area:     9.30 cm LA Vol (A2C):   43.7 ml 19.08 ml/m  RA Volume:   17.60 ml 7.69 ml/m LA Vol (A4C):   56.9 ml 24.85 ml/m LA Biplane Vol: 51.7 ml 22.58 ml/m AORTIC VALVE                    PULMONIC VALVE AV Area (Vmax):    4.46 cm     PV Vmax:       0.65 m/s AV Area (Vmean):   4.29 cm     PV Peak grad:  1.7 mmHg AV Area (VTI):     4.32 cm AV Vmax:           70.90 cm/s AV Vmean:          47.400 cm/s AV VTI:            0.155 m AV Peak Grad:      2.0 mmHg AV Mean Grad:      1.0 mmHg LVOT Vmax:         76.10 cm/s LVOT Vmean:        48.900 cm/s LVOT VTI:          0.161 m LVOT/AV VTI ratio: 1.04  AORTA Ao Root diam: 5.16 cm Ao Asc diam:  4.50 cm Ao Arch diam: 4.2 cm  MITRAL VALVE MV Area (PHT): 3.31 cm    SHUNTS MV Decel Time: 229 msec    Systemic VTI:  0.16 m MV E velocity: 66.60 cm/s  Systemic Diam: 2.30 cm MV A velocity: 59.70 cm/s MV E/A ratio:  1.12  Mihai Croitoru MD Electronically signed by Thurmon Fair MD Signature Date/Time: 05/04/2023/3:11:27 PM    Final  TEE  ECHO INTRAOPERATIVE TEE 05/06/2023  Narrative *INTRAOPERATIVE TRANSESOPHAGEAL REPORT *    Patient Name:   AASON BENOIT Date of Exam: 05/06/2023 Medical Rec #:  [id number]       Height:       72.0 in Accession #:    [id number]  Weight:       237.0 lb Date of Birth:  [birth year]       BSA:          2.29 m Patient Age:    52 years        BP:           127/90 mmHg Patient Gender: M               HR:           67 bpm. Exam  Location:  Inpatient  Transesophogeal exam was perform intraoperatively during surgical procedure. Patient was closely monitored under general anesthesia during the entirety of examination.  Indications:     Valve sparing root replacement surgery Performing Phys: Leslye Peer MD Diagnosing Phys: Leslye Peer MD  Complications: No known complications during this procedure. POST-OP IMPRESSIONS _ Left Ventricle: The left ventricle is unchanged from pre-bypass. _ Right Ventricle: The right ventricle appears unchanged from pre-bypass. _ Aorta: A graft was placed in the ascending aorta for repair. _ Left Atrium: The left atrium appears unchanged from pre-bypass. _ Left Atrial Appendage: The left atrial appendage appears unchanged from pre-bypass. _ Aortic Valve: A bioprosthetic valve was placed. _ Mitral Valve: The mitral valve appears unchanged from pre-bypass. _ Tricuspid Valve: The tricuspid valve appears unchanged from pre-bypass. _ Pulmonic Valve: The pulmonic valve appears unchanged from pre-bypass. _ Interatrial Septum: The interatrial septum appears unchanged from pre-bypass. _ Interventricular Septum: The interventricular septum appears unchanged from pre-bypass. _ Pericardium: The pericardium appears unchanged from pre-bypass.  PRE-OP FINDINGS Left Ventricle: The left ventricle has normal systolic function, with an ejection fraction of 55-60%. The cavity size was normal. No evidence of left ventricular regional wall motion abnormalities. There is mild left ventricular hypertrophy of the septal and basal segments.   Right Ventricle: The right ventricle has normal systolic function. The cavity was normal. There is no increase in right ventricular wall thickness.  Left Atrium: Left atrial size was normal in size. No left atrial/left atrial appendage thrombus was detected.  Right Atrium: Right atrial size was normal in size.  Interatrial Septum: The interatrial septum was not  assessed.  Pericardium: There is no evidence of pericardial effusion.  Mitral Valve: The mitral valve is normal in structure. Mitral valve regurgitation is trivial by color flow Doppler. There is No evidence of mitral stenosis.  Tricuspid Valve: The tricuspid valve was normal in structure. Tricuspid valve regurgitation is trivial by color flow Doppler. No evidence of tricuspid stenosis is present.  Aortic Valve: The aortic valve is tricuspid Aortic valve regurgitation is trivial by color flow Doppler. There is no stenosis of the aortic valve.   Pulmonic Valve: The pulmonic valve was not assessed. Pulmonic valve regurgitation was not assessed by color flow Doppler.   Aorta: The is normal in size and structure. There is severe dilatation of the aortic root. There is moderate dilatation of the ascending aorta.   Leslye Peer MD Electronically signed by Leslye Peer MD Signature Date/Time: 05/06/2023/1:29:43 PM    Final  MONITORS  LONG TERM MONITOR (3-14 DAYS) 12/14/2022  Narrative Patch Wear Time:  9 days and 3 hours (2024-02-05T16:10:17-0500 to 2024-02-14T19:43:19-0500)  Patient had a min HR of 54 bpm, max HR of 178 bpm, and avg HR of 86 bpm. Predominant underlying rhythm was Sinus Rhythm. Isolated SVEs were rare (<1.0%), SVE Couplets were rare (<1.0%), and SVE Triplets were rare (<1.0%). Isolated VEs were rare (<1.0%), VE Couplets were rare (<1.0%), and no  VE Triplets were present.  Symptoms associated with rare premature atrial complexes and rare premature ventricular complexes.  Conclusion: Rare Symptomatic premature atrial complexes and Rare symptomatic premature ventricular complexes.           Risk Assessment/Calculations:    CHA2DS2-VASc Score = 2   This indicates a 2.2% annual risk of stroke. The patient's score is based upon: CHF History: 1 HTN History: 1 Diabetes History: 0 Stroke History: 0 Vascular Disease History: 0 Age Score: 0 Gender Score: 0             Physical Exam:   VS:  BP 124/72   Pulse 76   Ht 6' (1.829 m)   Wt 236 lb 4.8 oz (107.2 kg)   SpO2 97%   BMI 32.05 kg/m    Wt Readings from Last 3 Encounters:  10/04/23 236 lb 4.8 oz (107.2 kg)  09/28/23 235 lb (106.6 kg)  09/14/23 238 lb 11.2 oz (108.3 kg)    GEN: Well nourished, well developed in no acute distress NECK: No JVD; No carotid bruits CARDIAC: RRR, no murmurs, rubs, gallops. Mechanical valve click noted RESPIRATORY:  Clear to auscultation without rales, wheezing or rhonchi  ABDOMEN: Soft, non-tender, non-distended EXTREMITIES:  No edema; No deformity   ASSESSMENT AND PLAN: .       Vertigo Episodes of dizziness and room spinning, particularly with changes in position. Symptoms have improved but are still present. Discussed the pathophysiology of vertigo and the potential for anxiety to exacerbate symptoms. -Continue Meclizine as needed for symptoms. -Consider referral for vestibular therapy if symptoms persist. -Has follow up with PCP later this week.  Palpitations / Postoperative atrial fibrillation Episodes of increased heart rate, potentially related to anxiety. EKG in the ED showed normal rhythm. -Continue Metoprolol half tablet daily. -Can take an extra half tablet as needed for palpitations. -Consider increasing daily dose if needing extra doses frequently. -On OAC due to mechanical valve. Has been off Amiodarone x2 months at Dr. Mallory Shirk direction, plan for monitor when he has been off Amiodarone 6 mos to reassess for PAF.   S/p Bental Procedure and Mechanical Aortic valve No current symptoms related to surgery. -Continue Metoprolol half tablet daily to maintain normal size aorta. -May resume cardiac rehab.  Hypertension BP well-controlled at home. -Continue current antihypertensive regimen. -Encouraged to eat and drink something if feeling lightheaded to naturally raise blood pressure.              Dispo: follow up as scheduled with Dr.  Servando Salina  Signed, Alver Sorrow, NP

## 2023-10-04 NOTE — Patient Instructions (Addendum)
Medication Instructions:   Continue current medications.   You may take additional half tablet of Metoprolol as needed for palptiations.   Continue Meclizine as needed for vertigo (dizziness) symptoms.   *If you need a refill on your cardiac medications before your next appointment, please call your pharmacy*  Lab Work: Your lab work in the hospital was reassuring.   Follow-Up: At [facility], you and your health needs are our priority.  As part of our continuing mission to provide you with exceptional heart care, we have created designated Provider Care Teams.  These Care Teams include your primary Cardiologist (physician) and Advanced Practice Providers (APPs -  Physician Assistants and Nurse Practitioners) who all work together to provide you with the care you need, when you need it.  We recommend signing up for the patient portal called "MyChart".  Sign up information is provided on this After Visit Summary.  MyChart is used to connect with patients for Virtual Visits (Telemedicine).  Patients are able to view lab/test results, encounter notes, upcoming appointments, etc.  Non-urgent messages can be sent to your provider as well.   To learn more about what you can do with MyChart, go to [email or web address].    Your next appointment:   As scheduled with Dr. Servando Salina  Other Instructions: If you have persistent vertigo could consider vestibular therapy  To prevent palpitations: Make sure you are adequately hydrated.  Avoid and/or limit caffeine containing beverages like soda or tea. Exercise regularly.  Manage stress well. Some over the counter medications can cause palpitations such as Benadryl, AdvilPM, TylenolPM. Regular Advil or Tylenol do not cause palpitations.    Vertigo Vertigo is the feeling that you or the things around you are moving when they are not. This feeling can come and go at any time. Vertigo often goes away on its own. This condition can be dangerous  if it happens when you are doing activities like driving or working with machines. Your doctor will do tests to find the cause of your vertigo. These tests will also help your doctor decide on the best treatment for you. Follow these instructions at home: Eating and drinking Drink enough fluid to keep your pee (urine) pale yellow. Do not drink alcohol. Activity Return to your normal activities when your doctor says that it is safe. In the morning, first sit up on the side of the bed. When you feel okay, stand slowly while you hold onto something until you know that your balance is fine. Move slowly. Avoid sudden body or head movements or certain positions, as told by your doctor. Use a cane if you have trouble standing or walking. Sit down right away if you feel dizzy. Avoid doing any tasks or activities that can cause danger to you or others if you get dizzy. Avoid bending down if you feel dizzy. Place items in your home so that they are easy for you to reach without bending or leaning over. Do not drive or use machinery if you feel dizzy. General instructions Take over-the-counter and prescription medicines only as told by your doctor. Keep all follow-up visits. Contact a doctor if: Your medicine does not help your vertigo. Your problems get worse or you have new symptoms. You have a fever. You feel like you may vomit (nauseous), or this feeling gets worse. You start to vomit. Your family or friends see changes in how you act. You lose feeling (have numbness) in part of your body. You feel prickling and tingling  in a part of your body. Get help right away if: You are always dizzy. You faint. You get very bad headaches. You get a stiff neck. Bright light starts to bother you. You have trouble moving or talking. You feel weak in your hands, arms, or legs. You have changes in your hearing or in how you see (vision). These symptoms may be an emergency. Get help right away. Call your  local emergency services (911 in the [place] Do not wait to see if the symptoms will go away. Do not drive yourself to the hospital. Summary Vertigo is the feeling that you or the things around you are moving when they are not. Your doctor will do tests to find the cause of your vertigo. You may be told to avoid some tasks, positions, or movements. Contact a doctor if your medicine is not helping, or if you have a fever, new symptoms, or a change in how you act. Get help right away if you get very bad headaches, or if you have changes in how you speak, hear, or see. This information is not intended to replace advice given to you by your health care provider. Make sure you discuss any questions you have with your health care provider. Document Revised: 09/04/2020 Document Reviewed: 09/04/2020 Elsevier Patient Education  2024 [employer].

## 2023-10-05 ENCOUNTER — Ambulatory Visit: Payer: Medicaid Other | Attending: Thoracic Surgery (Cardiothoracic Vascular Surgery) | Admitting: *Deleted

## 2023-10-05 DIAGNOSIS — Z5181 Encounter for therapeutic drug level monitoring: Secondary | ICD-10-CM | POA: Diagnosis not present

## 2023-10-05 DIAGNOSIS — I480 Paroxysmal atrial fibrillation: Secondary | ICD-10-CM

## 2023-10-05 DIAGNOSIS — Z952 Presence of prosthetic heart valve: Secondary | ICD-10-CM | POA: Diagnosis not present

## 2023-10-05 DIAGNOSIS — I4891 Unspecified atrial fibrillation: Secondary | ICD-10-CM | POA: Diagnosis not present

## 2023-10-05 LAB — POCT INR: INR: 2.3 (ref 2.0–3.0)

## 2023-10-05 NOTE — Patient Instructions (Signed)
Description   Take an extra 1/2 tablet of warfarin today and then START taking warfarin 2 TABLETS DAILY EXCEPT 3 TABLETS ON TUESDAYS, THURSDAYS, AND SATURDAYS.   Recheck INR in 3 weeks. Stay consistent with greens each week-2 times per week.  [facility] [phone] Amio stopped on 07/22/23.

## 2023-10-06 ENCOUNTER — Encounter (HOSPITAL_COMMUNITY)
Admission: RE | Admit: 2023-10-06 | Discharge: 2023-10-06 | Disposition: A | Discharge status: Home / Self Care | Payer: Medicaid Other | Source: Ambulatory Visit | Attending: Cardiology | Admitting: Cardiology

## 2023-10-06 ENCOUNTER — Other Ambulatory Visit (HOSPITAL_COMMUNITY): Payer: Self-pay

## 2023-10-06 DIAGNOSIS — Z9889 Other specified postprocedural states: Secondary | ICD-10-CM

## 2023-10-06 DIAGNOSIS — Z952 Presence of prosthetic heart valve: Secondary | ICD-10-CM

## 2023-10-06 DIAGNOSIS — Z48812 Encounter for surgical aftercare following surgery on the circulatory system: Secondary | ICD-10-CM | POA: Diagnosis not present

## 2023-10-06 NOTE — Progress Notes (Signed)
Hammie returned to exercise today per Gillian Shields NP. Caison exercised without complaints or symptoms.Gladstone Lighter, RN,BSN 10/06/2023 11:27 AM

## 2023-10-07 ENCOUNTER — Ambulatory Visit: Payer: Medicaid Other | Admitting: Internal Medicine

## 2023-10-07 ENCOUNTER — Encounter: Payer: Self-pay | Admitting: Internal Medicine

## 2023-10-07 VITALS — BP 130/84 | HR 74 | Temp 98.7°F | Ht 72.0 in | Wt 236.0 lb

## 2023-10-07 DIAGNOSIS — Z952 Presence of prosthetic heart valve: Secondary | ICD-10-CM

## 2023-10-07 DIAGNOSIS — R002 Palpitations: Secondary | ICD-10-CM | POA: Diagnosis not present

## 2023-10-07 DIAGNOSIS — R42 Dizziness and giddiness: Secondary | ICD-10-CM

## 2023-10-07 NOTE — Patient Instructions (Addendum)
Timothy Browning,   It was a pleasure meeting you today.   I am not making any medication changes today. If you are interested in a medication for your dizziness or for vestibular rehab, please call the clinic. We will see you again in January, however if you don't need that appointment feel free to cancel and reschedule.   I also recommend getting a pulse oximeter from a pharmacy. This will tell you your blood oxygen level and heart rate. If you heart rate is less than 50 or more than 110 for a long period while you're resting, I would recommend going to the emergency room.   Please call our clinic if you have any questions or concerns, we may be able to help and keep you from a long and expensive emergency room wait. Our clinic and after hours phone number is [phone], the best time to call is Monday through Friday 9 am to 4 pm but there is always someone available 24/7 if you have an emergency. If you need medication refills please notify your pharmacy one week in advance and they will send [place]  a request.    Thanks,  Dr Carlynn Purl

## 2023-10-07 NOTE — Progress Notes (Signed)
Subjective:  CC: ED follow up, dizziness  HPI:  Timothy Browning is a 52 y.o. male with a past medical history stated below and presents today for above. Please see problem based assessment and plan for additional details.  Past Medical History:  Diagnosis Date   Aortic aneurysm without rupture (HCC)    Chest pain of uncertain etiology 08/11/2021   CKD (chronic kidney disease)    GERD (gastroesophageal reflux disease)    Heart failure (HCC)    Hypertension    Obesity (BMI 30-39.9) 08/11/2021   Sleep apnea     Current Outpatient Medications on File Prior to Visit  Medication Sig Dispense Refill   aspirin EC 81 MG tablet Take 1 tablet (81 mg total) by mouth daily. Swallow whole. 30 tablet 12   cyclobenzaprine (FLEXERIL) 5 MG tablet Take 5 mg by mouth 3 (three) times daily as needed for muscle spasms.     meclizine (ANTIVERT) 25 MG tablet Take 1 tablet (25 mg total) by mouth 3 (three) times daily as needed for dizziness. 30 tablet 0   metoprolol succinate (TOPROL-XL) 25 MG 24 hr tablet Take 0.5 tablets (12.5 mg total) by mouth daily with breakfast. 30 tablet 2   rosuvastatin (CRESTOR) 5 MG tablet Take 1 tablet (5 mg total) by mouth daily. (Patient taking differently: Take 5 mg by mouth at bedtime.) 90 tablet 3   sildenafil (VIAGRA) 25 MG tablet Take 100 mg by mouth daily as needed for erectile dysfunction.     warfarin (COUMADIN) 2.5 MG tablet Take 2 tablets (5 mg total) by mouth daily. Or as directed by the [facility] 60 tablet 11   No current facility-administered medications on file prior to visit.    Review of Systems: ROS negative except for as is noted on the assessment and plan.  Objective:   Vitals:   10/07/23 1349  BP: 130/84  Pulse: 74  Temp: 98.7 F (37.1 C)  TempSrc: Oral  Weight: 236 lb (107 kg)  Height: 6' (1.829 m)    Physical Exam: Constitutional: well-appearing, in no acute distress HENT: normocephalic atraumatic, mucous membranes  moist Cardiovascular: mechanical click of heart valve, regular rhythm Pulmonary/Chest: normal work of breathing on room air, lungs clear to auscultation bilaterally  Assessment & Plan:   Dizziness Patient presents for ED follow-up after a prolonged episode of dizziness. In the ED CBC was within normal limits, EKG unchanged from previous, BP was normotensive. Dizziness resolved spontaneously and patient was discharged with meclizine. Since then he has seen cardiology who agreed with meclizine for symptomatic relief. Patient has used the meclizine a couple times, but most recently felt hot and sweaty after taking it. He has not had any more episodes of dizziness and has not used the meclizine since then. He is not interested in a different medication for dizziness/vertigo or in vestibular rehab at this time. No changes in management at this time, will discuss other pharmaceutical options or offer vestibular rehab again in the future if symptoms return/worsen. Patient has another appointment already scheduled in January which he would like to keep.   Palpitations Patient takes Toprol XL 12.5mg  daily for palpitations. Cardiology has recommended an extra 12.5mg  dose as needed for breakthrough symptoms, which patient has not needed. Symptoms stable.   Aortic valve replaced S/p aortic valve replacement for aortic aneurysm. Followed by cardiology, INR checked at heart care clinic. No changes to management.     Patient seen with Dr. Assunta Gambles MD  [facility]  [id number] Pager: [phone] Date 10/07/2023  Time 11:18 PM

## 2023-10-07 NOTE — Assessment & Plan Note (Signed)
Patient takes Toprol XL 12.5mg  daily for palpitations. Cardiology has recommended an extra 12.5mg  dose as needed for breakthrough symptoms, which patient has not needed. Symptoms stable.

## 2023-10-07 NOTE — Assessment & Plan Note (Signed)
S/p aortic valve replacement for aortic aneurysm. Followed by cardiology, INR checked at heart care clinic. No changes to management.

## 2023-10-07 NOTE — Assessment & Plan Note (Addendum)
Patient presents for ED follow-up after a prolonged episode of dizziness. In the ED CBC was within normal limits, EKG unchanged from previous, BP was normotensive. Dizziness resolved spontaneously and patient was discharged with meclizine. Since then he has seen cardiology who agreed with meclizine for symptomatic relief. Patient has used the meclizine a couple times, but most recently felt hot and sweaty after taking it. He has not had any more episodes of dizziness and has not used the meclizine since then. He is not interested in a different medication for dizziness/vertigo or in vestibular rehab at this time. No changes in management at this time, will discuss other pharmaceutical options or offer vestibular rehab again in the future if symptoms return/worsen. Patient has another appointment already scheduled in January which he would like to keep.

## 2023-10-08 ENCOUNTER — Encounter (HOSPITAL_COMMUNITY)
Admission: RE | Admit: 2023-10-08 | Discharge: 2023-10-08 | Disposition: A | Discharge status: Home / Self Care | Payer: Medicaid Other | Source: Ambulatory Visit | Attending: Cardiology | Admitting: Cardiology

## 2023-10-08 ENCOUNTER — Telehealth: Payer: Self-pay | Admitting: Cardiology

## 2023-10-08 DIAGNOSIS — Z952 Presence of prosthetic heart valve: Secondary | ICD-10-CM

## 2023-10-08 DIAGNOSIS — Z9889 Other specified postprocedural states: Secondary | ICD-10-CM

## 2023-10-08 DIAGNOSIS — Z48812 Encounter for surgical aftercare following surgery on the circulatory system: Secondary | ICD-10-CM | POA: Diagnosis not present

## 2023-10-08 NOTE — Telephone Encounter (Signed)
Called and spoke to [place]  Initial call was for Cpap renewal  Needs OV note 06/14/23-present  Fax Number: [phone]

## 2023-10-08 NOTE — Progress Notes (Signed)
Internal Medicine Clinic Attending  I was physically present during the key portions of the resident provided service and participated in the medical decision making of patient's management care. I reviewed pertinent patient test results.  The assessment, diagnosis, and plan were formulated together and I agree with the documentation in the resident's note.  Mercie Eon, MD

## 2023-10-08 NOTE — Telephone Encounter (Signed)
Office calling to speak with a nurse. Please advise

## 2023-10-11 ENCOUNTER — Encounter (HOSPITAL_COMMUNITY)
Admission: RE | Admit: 2023-10-11 | Discharge: 2023-10-11 | Disposition: A | Discharge status: Home / Self Care | Payer: Medicaid Other | Source: Ambulatory Visit | Attending: Cardiology

## 2023-10-11 DIAGNOSIS — Z9889 Other specified postprocedural states: Secondary | ICD-10-CM

## 2023-10-11 DIAGNOSIS — Z952 Presence of prosthetic heart valve: Secondary | ICD-10-CM

## 2023-10-11 DIAGNOSIS — Z48812 Encounter for surgical aftercare following surgery on the circulatory system: Secondary | ICD-10-CM | POA: Diagnosis not present

## 2023-10-11 NOTE — Telephone Encounter (Signed)
Last two OV notes faxed to: [phone]

## 2023-10-15 ENCOUNTER — Encounter (HOSPITAL_COMMUNITY)
Admission: RE | Admit: 2023-10-15 | Discharge: 2023-10-15 | Disposition: A | Discharge status: Home / Self Care | Payer: Medicaid Other | Source: Ambulatory Visit | Attending: Cardiology

## 2023-10-15 DIAGNOSIS — Z9889 Other specified postprocedural states: Secondary | ICD-10-CM | POA: Diagnosis not present

## 2023-10-15 DIAGNOSIS — Z952 Presence of prosthetic heart valve: Secondary | ICD-10-CM

## 2023-10-15 DIAGNOSIS — Z48812 Encounter for surgical aftercare following surgery on the circulatory system: Secondary | ICD-10-CM | POA: Diagnosis not present

## 2023-10-18 ENCOUNTER — Encounter (HOSPITAL_COMMUNITY)
Admission: RE | Admit: 2023-10-18 | Discharge: 2023-10-18 | Disposition: A | Discharge status: Home / Self Care | Payer: Medicaid Other | Source: Ambulatory Visit | Attending: Cardiology

## 2023-10-18 VITALS — Ht 72.0 in | Wt 232.8 lb

## 2023-10-18 DIAGNOSIS — Z9889 Other specified postprocedural states: Secondary | ICD-10-CM

## 2023-10-18 DIAGNOSIS — Z48812 Encounter for surgical aftercare following surgery on the circulatory system: Secondary | ICD-10-CM | POA: Diagnosis not present

## 2023-10-18 DIAGNOSIS — Z952 Presence of prosthetic heart valve: Secondary | ICD-10-CM | POA: Diagnosis not present

## 2023-10-20 DIAGNOSIS — Z419 Encounter for procedure for purposes other than remedying health state, unspecified: Secondary | ICD-10-CM | POA: Diagnosis not present

## 2023-10-22 ENCOUNTER — Encounter (HOSPITAL_COMMUNITY)
Admission: RE | Admit: 2023-10-22 | Discharge: 2023-10-22 | Disposition: A | Discharge status: Home / Self Care | Payer: Medicaid Other | Source: Ambulatory Visit | Attending: Cardiology | Admitting: Cardiology

## 2023-10-22 DIAGNOSIS — Z952 Presence of prosthetic heart valve: Secondary | ICD-10-CM | POA: Insufficient documentation

## 2023-10-22 DIAGNOSIS — Z9889 Other specified postprocedural states: Secondary | ICD-10-CM | POA: Diagnosis not present

## 2023-10-25 ENCOUNTER — Encounter (HOSPITAL_COMMUNITY)
Admission: RE | Admit: 2023-10-25 | Discharge: 2023-10-25 | Disposition: A | Discharge status: Home / Self Care | Payer: Medicaid Other | Source: Ambulatory Visit | Attending: Cardiology | Admitting: Cardiology

## 2023-10-25 DIAGNOSIS — Z9889 Other specified postprocedural states: Secondary | ICD-10-CM | POA: Diagnosis not present

## 2023-10-25 DIAGNOSIS — Z952 Presence of prosthetic heart valve: Secondary | ICD-10-CM

## 2023-10-25 NOTE — Progress Notes (Signed)
 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 Swallow whole., Disp: 30 tablet, Rfl: 12   cyclobenzaprine  (FLEXERIL ) 5 MG tablet, Take 5 mg by mouth 3 (three) times daily as needed for muscle spasms., Disp: , Rfl:    meclizine  (ANTIVERT ) 25 MG tablet, Take 1 tablet (25 mg total) by mouth 3 (three) times daily as needed for dizziness., Disp: 30 tablet, Rfl: 0   metoprolol  succinate (TOPROL -XL) 25 MG 24 hr tablet, Take 0.5 tablets (12.5 mg total) by mouth daily with breakfast., Disp: 30 tablet, Rfl: 2   rosuvastatin  (CRESTOR ) 5 MG tablet, Take 1 tablet (5 mg total) by mouth daily. (Patient taking differently: Take 5 mg by mouth at bedtime.), Disp: 90 tablet, Rfl: 3   sildenafil (VIAGRA) 25 MG tablet, Take 100 mg by mouth daily as needed for erectile dysfunction., Disp: , Rfl:    warfarin (COUMADIN ) 2.5 MG tablet, Take 2 tablets (5 mg total) by mouth daily. Or as directed by the Coumadin  Clinic, Disp: 60 tablet, Rfl: 11  Past Medical History: Past Medical History:  Diagnosis Date   Aortic aneurysm without rupture (HCC)    Chest pain of uncertain etiology 08/11/2021   CKD (chronic kidney disease)    GERD (gastroesophageal reflux disease)    Heart failure (HCC)    Hypertension     Obesity (BMI 30-39.9) 08/11/2021   Sleep apnea     Tobacco Use: Social History   Tobacco Use  Smoking Status Never  Smokeless Tobacco Never    Labs: Review Flowsheet  More data exists      Latest Ref Rng & Units 07/14/2022 05/04/2023 05/06/2023 05/07/2023 06/02/2023  Labs for ITP Cardiac and Pulmonary Rehab  Cholestrol 100 - 199 mg/dL 796  - - - 866   LDL (calc) 0 - 99 mg/dL 867  - - - 59   HDL-C >60 mg/dL 57  - - - 61   Trlycerides 0 - 149 mg/dL 70  - - - 62   Hemoglobin A1c 4.8 - 5.6 % 5.2  5.4  - - -  PH, Arterial 7.35 - 7.45 - 7.4  7.328  7.299  7.330  7.339  7.377  7.366  7.391  7.329  7.333  -  PCO2 arterial 32 - 48 mmHg - 38  43.1  43.9  41.5  38.0  38.7  38.4  40.8  42.9  45.0  -  Bicarbonate 20.0 - 28.0 mmol/L - 23.5  22.4  21.3  21.8  20.8  22.8  22.0  24.8  22.6  23.7  -  TCO2 22 - 32 mmol/L - - 24  23  23  22  24  24  23  27  26  27  25  24  25   -  Acid-base deficit 0.0 -  2.0 mmol/L - 1.1  3.0  5.0  4.0  5.0  2.0  3.0  3.0  2.0  -  O2 Saturation % - 97.4  92  92  97  95  99  100  100  100  96  -    Details       Multiple values from one day are sorted in reverse-chronological order         Capillary Blood Glucose: Lab Results  Component Value Date   GLUCAP 85 09/28/2023   GLUCAP 126 (H) 07/04/2023   GLUCAP 88 05/12/2023   GLUCAP 133 (H) 05/11/2023   GLUCAP 113 (H) 05/11/2023     Exercise Target Goals: Exercise Program Goal: Individual exercise prescription set using results from initial 6 min walk test and THRR while considering  patient's activity barriers and safety.   Exercise Prescription Goal: Initial exercise prescription builds to 30-45 minutes a day of aerobic activity, 2-3 days per week.  Home exercise guidelines will be given to patient during program as part of exercise prescription that the participant will acknowledge.  Activity Barriers & Risk Stratification:  Activity Barriers & Cardiac Risk Stratification - 08/03/23 1040        Activity Barriers & Cardiac Risk Stratification   Activity Barriers Back Problems;Other (comment);Balance Concerns    Comments Right foot drop (wears foot brace), chronic back pain, bilateral knee pain (needs knee replacement).    Cardiac Risk Stratification Low             6 Minute Walk:  6 Minute Walk     Row Name 08/03/23 1143 10/18/23 1200       6 Minute Walk   Phase Initial Discharge    Distance 1161 feet 1143 feet    Distance % Change -- -1.55 %    Distance Feet Change -- -18 ft    Walk Time 6 minutes 6 minutes    # of Rest Breaks 0 0    MPH 2.2 2.1    METS 3.42 3.53    RPE 9 10    Perceived Dyspnea  1 --    VO2 Peak 11.97 12.37    Symptoms Yes (comment) Yes (comment)    Comments Mild shortness of breath. Back pain, chronic with walking, 3/10 on pain scale. chronic back pain 3/10    Resting HR 66 bpm 89 bpm    Resting BP 128/80 128/70    Resting Oxygen Saturation  100 % --    Exercise Oxygen Saturation  during 6 min walk 99 % --    Max Ex. HR 86 bpm 101 bpm    Max Ex. BP 132/76 138/66    2 Minute Post BP 110/80 --             Oxygen Initial Assessment:   Oxygen Re-Evaluation:   Oxygen Discharge (Final Oxygen Re-Evaluation):   Initial Exercise Prescription:  Initial Exercise Prescription - 08/03/23 1300       Date of Initial Exercise RX and Referring Provider   Date 08/03/23    Referring Provider Tobb, Kardie, DO    Expected Discharge Date 10/27/23      Recumbant Bike   Level 1    Watts 45    Minutes 15    METs 3.4      T5 Nustep   Level 2    SPM 85    Minutes 15    METs 3      Prescription Details   Frequency (times  per week) 3    Duration Progress to 30 minutes of continuous aerobic without signs/symptoms of physical distress      Intensity   THRR 40-80% of Max Heartrate 68-135    Ratings of Perceived Exertion 11-13    Perceived Dyspnea 0-4      Progression   Progression Continue to progress workloads to maintain intensity  without signs/symptoms of physical distress.      Resistance Training   Training Prescription Yes    Weight 5 lbs    Reps 10-15             Perform Capillary Blood Glucose checks as needed.  Exercise Prescription Changes:   Exercise Prescription Changes     Row Name 08/11/23 1030 08/25/23 1028 09/06/23 1025 09/20/23 1024 10/11/23 1020     Response to Exercise   Blood Pressure (Admit) 118/70 116/70 120/78 116/70 122/80   Blood Pressure (Exercise) 126/78 132/78 -- -- --   Blood Pressure (Exit) 118/72 114/74 118/70 116/62 112/72   Heart Rate (Admit) 76 bpm 70 bpm 76 bpm 74 bpm 82 bpm   Heart Rate (Exercise) 95 bpm 87 bpm 93 bpm 98 bpm 99 bpm   Heart Rate (Exit) 87 bpm 71 bpm 76 bpm 83 bpm 79 bpm   Rating of Perceived Exertion (Exercise) 11 12 12 11 11    Symptoms None None None None None   Comments Off to a good start with exercise. Increased workload on recumbent bike today. -- Reviewed METs and goals with Bette. Encouraged to increase pace with workout as tolerated. --   Duration Continue with 30 min of aerobic exercise without signs/symptoms of physical distress. Continue with 30 min of aerobic exercise without signs/symptoms of physical distress. Continue with 30 min of aerobic exercise without signs/symptoms of physical distress. Continue with 30 min of aerobic exercise without signs/symptoms of physical distress. Continue with 30 min of aerobic exercise without signs/symptoms of physical distress.   Intensity THRR unchanged THRR unchanged THRR unchanged THRR unchanged THRR unchanged     Progression   Progression Continue to progress workloads to maintain intensity without signs/symptoms of physical distress. Continue to progress workloads to maintain intensity without signs/symptoms of physical distress. Continue to progress workloads to maintain intensity without signs/symptoms of physical distress. Continue to progress workloads to maintain intensity without signs/symptoms of  physical distress. Continue to progress workloads to maintain intensity without signs/symptoms of physical distress.   Average METs 2.2 2.4 2.4 2.4 2.4     Resistance Training   Training Prescription No  Relaxation day, no weights. No  Relaxation day, no weights. Yes Yes Yes   Weight -- -- 5 lbs 5 lbs 5 lbs   Reps -- -- 10-15 10-15 10-15   Time -- -- 10 Minutes 10 Minutes 10 Minutes     Interval Training   Interval Training No No No No No     Recumbant Bike   Level 2 4 3 3 3    RPM 65 53 54 65 51   Watts 20 35 30 38 26   Minutes 15 15 15 15 15    METs 2.2 2.8 2.6 2.8 2.4     T5 Nustep   Level 2 3 3 3 4    SPM 97 83 95 90 94   Minutes 15 15 15 15 15    METs 2.3 2 2.2 2.1 2.4     Home Exercise Plan   Plans to continue exercise at -- Home (comment)  Walking Home (comment)  Walking Home (comment)  Walking Home (comment)  Walking   Frequency -- Add 2 additional days to program exercise sessions. Add 2 additional days to program exercise sessions. Add 2 additional days to program exercise sessions. Add 2 additional days to program exercise sessions.   Initial Home Exercises Provided -- 08/18/23 08/18/23 08/18/23 08/18/23    Row Name 10/25/23 1018             Response to Exercise   Blood Pressure (Admit) 110/64       Blood Pressure (Exit) 102/66       Heart Rate (Admit) 88 bpm       Heart Rate (Exercise) 109 bpm       Heart Rate (Exit) 93 bpm       Rating of Perceived Exertion (Exercise) 12       Symptoms None       Comments Reviewed goals with Bette.       Duration Continue with 30 min of aerobic exercise without signs/symptoms of physical distress.       Intensity THRR unchanged         Progression   Progression Continue to progress workloads to maintain intensity without signs/symptoms of physical distress.       Average METs 2.7         Resistance Training   Training Prescription Yes       Weight 5 lbs       Reps 10-15       Time 10 Minutes         Interval Training    Interval Training No         Recumbant Bike   Level 4       RPM 47       Watts 39       Minutes 15       METs 2.9         T5 Nustep   Level 4       SPM 84       Minutes 15       METs 2.5         Home Exercise Plan   Plans to continue exercise at Home (comment)  Walking       Frequency Add 2 additional days to program exercise sessions.       Initial Home Exercises Provided 08/18/23                Exercise Comments:   Exercise Comments     Row Name 08/11/23 1121 08/18/23 1044 08/27/23 1106 09/15/23 1045 09/20/23 1058   Exercise Comments Gunter tolerated low intensity exercise well without symptoms. Increased workload on the recumbent bike from level 1 to 2. Oriented to stretching and exercise routine. Reviewed home exercise guidelines and goals with Bette. Increased workloads on Nustep and recumbent bike today. Reviewed METs with Bette. Decreased workload on recumbent bike from 4 to 3, he c/o knees burning at the higher workload. Reviewed goals with Bette. Oriented him to the recumbent elliptical machine. Reviewed METs with Bette. Discussed increasing workload and pace to help increase MET level and gain confidence with exercise. He will switch between recumbent bike and recumbent elliptical.    Row Name 10/15/23 1104 10/25/23 1030         Exercise Comments Reviewed goals with Bette. Reviewed goals with Bette.               Exercise Goals and Review:   Exercise Goals  Row Name 08/03/23 1040             Exercise Goals   Increase Physical Activity Yes       Intervention Provide advice, education, support and counseling about physical activity/exercise needs.;Develop an individualized exercise prescription for aerobic and resistive training based on initial evaluation findings, risk stratification, comorbidities and participant's personal goals.       Expected Outcomes Short Term: Attend rehab on a regular basis to increase amount of physical activity.;Long  Term: Add in home exercise to make exercise part of routine and to increase amount of physical activity.;Long Term: Exercising regularly at least 3-5 days a week.       Increase Strength and Stamina Yes       Intervention Provide advice, education, support and counseling about physical activity/exercise needs.;Develop an individualized exercise prescription for aerobic and resistive training based on initial evaluation findings, risk stratification, comorbidities and participant's personal goals.       Expected Outcomes Short Term: Increase workloads from initial exercise prescription for resistance, speed, and METs.;Short Term: Perform resistance training exercises routinely during rehab and add in resistance training at home;Long Term: Improve cardiorespiratory fitness, muscular endurance and strength as measured by increased METs and functional capacity ( )       Able to understand and use rate of perceived exertion (RPE) scale Yes       Intervention Provide education and explanation on how to use RPE scale       Expected Outcomes Short Term: Able to use RPE daily in rehab to express subjective intensity level;Long Term:  Able to use RPE to guide intensity level when exercising independently       Knowledge and understanding of Target Heart Rate Range (THRR) Yes       Intervention Provide education and explanation of THRR including how the numbers were predicted and where they are located for reference       Expected Outcomes Short Term: Able to state/look up THRR;Long Term: Able to use THRR to govern intensity when exercising independently;Short Term: Able to use daily as guideline for intensity in rehab       Able to check pulse independently Yes       Intervention Provide education and demonstration on how to check pulse in carotid and radial arteries.;Review the importance of being able to check your own pulse for safety during independent exercise       Expected Outcomes Short Term: Able to  explain why pulse checking is important during independent exercise;Long Term: Able to check pulse independently and accurately       Understanding of Exercise Prescription Yes       Intervention Provide education, explanation, and written materials on patient's individual exercise prescription       Expected Outcomes Short Term: Able to explain program exercise prescription;Long Term: Able to explain home exercise prescription to exercise independently                Exercise Goals Re-Evaluation :  Exercise Goals Re-Evaluation     Row Name 08/11/23 1121 08/18/23 1044 09/15/23 1045 10/15/23 1104 10/25/23 1030     Exercise Goal Re-Evaluation   Exercise Goals Review Increase Physical Activity;Increase Strength and Stamina;Able to understand and use rate of perceived exertion (RPE) scale;Knowledge and understanding of Target Heart Rate Range (THRR) Increase Physical Activity;Increase Strength and Stamina;Able to understand and use rate of perceived exertion (RPE) scale;Knowledge and understanding of Target Heart Rate Range (THRR);Understanding of Exercise Prescription Increase Physical  Activity;Increase Strength and Stamina;Able to understand and use rate of perceived exertion (RPE) scale;Knowledge and understanding of Target Heart Rate Range (THRR);Understanding of Exercise Prescription Increase Physical Activity;Increase Strength and Stamina;Able to understand and use rate of perceived exertion (RPE) scale;Knowledge and understanding of Target Heart Rate Range (THRR);Understanding of Exercise Prescription Increase Physical Activity;Increase Strength and Stamina;Able to understand and use rate of perceived exertion (RPE) scale;Knowledge and understanding of Target Heart Rate Range (THRR);Understanding of Exercise Prescription   Comments Fines was able to understand and use RPE scale appropriately. Reviewed MET scale and THRR with him. Reviewed exercise prescription with Bette. Discussed increasing  walking duration, and he is amenable to this. Progress workloads at cardiac rehab to help build confidence with exercise. Increased level on NuStep and Recumbent Bike today to level 3.0. Flavius is making gradual progress with exercise. He does some walking at home but is limited by chronic knee and back pain. He needs bilateral knee replacement and may get cortisone injections to help with his knee pain. The knee pain impedes his ability to participate in some leisure activities because his walking is limited. We discussed carying up his workout at cardiac rehab, and he tried the recubent elliptical today and tolerated well. He will also increase his hand weights to 6 lbs next week. Ermal feels comfortable exercising at cardiac rehab but still is short of breath walking at home. He has mentioned this to his doctors but no clear answer as to why he has mild symptoms with walking. We discussed weight bearing activity might be more challenging because of his knee pain. he is walking about 10-15 minutes and needs to take a rest break with walking. He has an upright stationary bike, a recumbent bike, and a universal weigth machine at home that he hasn't been using. We discussed using either bike at least 1 or 2 days outside of cardiac rehab to see if he can achieve 30 minutes and if it's easier than walking to make that his mode of home exercise. We discussed using his weight machine at the lightest weight (10 lbs) and progressing as tolerated, 2-3 non-consecutive days upon completion of cardaic rehab. Chip states he will continue therapy for his back  after cardiac rehab. Appointments for that have not yet been scheduled.   Expected Outcomes Progress workloads as tolerated to help increase cardiorespiratory fitness. Increase home walking duration 1-2 minutes each session with a goal of 30 minutes at least 2 days/week. Increase workloads as tolerated. Continue to progress workloads as tolerated. Guerino will start riding  his bike 1-2 days/week in addition to exercise at cardiac rehab. Kalan will resume rehab for his back upon completion if the cardiac rehab program.            Discharge Exercise Prescription (Final Exercise Prescription Changes):  Exercise Prescription Changes - 10/25/23 1018       Response to Exercise   Blood Pressure (Admit) 110/64    Blood Pressure (Exit) 102/66    Heart Rate (Admit) 88 bpm    Heart Rate (Exercise) 109 bpm    Heart Rate (Exit) 93 bpm    Rating of Perceived Exertion (Exercise) 12    Symptoms None    Comments Reviewed goals with Bette.    Duration Continue with 30 min of aerobic exercise without signs/symptoms of physical distress.    Intensity THRR unchanged      Progression   Progression Continue to progress workloads to maintain intensity without signs/symptoms of physical distress.  Average METs 2.7      Resistance Training   Training Prescription Yes    Weight 5 lbs    Reps 10-15    Time 10 Minutes      Interval Training   Interval Training No      Recumbant Bike   Level 4    RPM 47    Watts 39    Minutes 15    METs 2.9      T5 Nustep   Level 4    SPM 84    Minutes 15    METs 2.5      Home Exercise Plan   Plans to continue exercise at Home (comment)   Walking   Frequency Add 2 additional days to program exercise sessions.    Initial Home Exercises Provided 08/18/23             Nutrition:  Target Goals: Understanding of nutrition guidelines, daily intake of sodium 1500mg , cholesterol 200mg , calories 30% from fat and 7% or less from saturated fats, daily to have 5 or more servings of fruits and vegetables.  Biometrics:  Pre Biometrics - 08/03/23 1020       Pre Biometrics   Waist Circumference 40.25 inches    Hip Circumference 43.5 inches    Waist to Hip Ratio 0.93 %    Triceps Skinfold 13 mm    % Body Fat 27.2 %    Grip Strength 54 kg    Flexibility 0 in    Single Leg Stand 24.37 seconds             Post  Biometrics - 10/18/23 1207        Post  Biometrics   Height 6' (1.829 m)    Weight 105.6 kg    Waist Circumference 41 inches    Hip Circumference 44 inches    Waist to Hip Ratio 0.93 %    BMI (Calculated) 31.57    Triceps Skinfold 15 mm    % Body Fat 28.4 %    Grip Strength 45 kg    Flexibility 0 in   did not attempt d/t chronic back pain   Single Leg Stand 30 seconds             Nutrition Therapy Plan and Nutrition Goals:  Nutrition Therapy & Goals - 09/06/23 1112       Nutrition Therapy   Diet Heart Healthy Diet    Drug/Food Interactions Statins/Certain Fruits;Coumadin /Vit K      Personal Nutrition Goals   Nutrition Goal Patient to identify strategies for reducing cardiovascular risk by attending the [facility] education and nutrition series weekly.   Goal in action.   Personal Goal #2 Patient to improve diet quality by using the plate method as a guide for meal planning to include lean protein/plant protein, fruits, vegetables, whole grains, nonfat dairy as part of a well-balanced diet.   goal in action.   Personal Goal #3 Patient to limit sodium to 1500mg  per day   goal in action.   Comments Goals in action. Patient has medical history of aortic aneurysm repair, HTN, CKD, Afib, obesity, OSA, CHF. He continues to attend the [facility] education and nutrition series. He has started making many dietary changes including reduced saturated fat intake, increased dietary fiber, and reading food labels for sodium. He continues regular follow-up with the anti-coagulation clinic. His lipids remain well controlled. His family remains a good support for lifestyle changes. Patient will benefit from participation in intensive cardiac  rehab for nutrition, exercise, and lifestyle modification.      Intervention Plan   Intervention Prescribe, educate and counsel regarding individualized specific dietary modifications aiming towards targeted core components such as weight, hypertension,  lipid management, diabetes, heart failure and other comorbidities.;Nutrition handout(s) given to patient.    Expected Outcomes Long Term Goal: Adherence to prescribed nutrition plan.;Short Term Goal: Understand basic principles of dietary content, such as calories, fat, sodium, cholesterol and nutrients.             Nutrition Assessments:  Nutrition Assessments - 08/12/23 1040       Rate Your Plate Scores   Pre Score 44            MEDIFICTS Score Key: >=70 Need to make dietary changes  40-70 Heart Healthy Diet <= 40 Therapeutic Level Cholesterol Diet   Flowsheet Row INTENSIVE CARDIAC REHAB from 08/11/2023 in [facility]  Picture Your Plate Total Score on Admission 44      Picture Your Plate Scores: <59 Unhealthy dietary pattern with much room for improvement. 41-50 Dietary pattern unlikely to meet recommendations for good health and room for improvement. 51-60 More healthful dietary pattern, with some room for improvement.  >60 Healthy dietary pattern, although there may be some specific behaviors that could be improved.    Nutrition Goals Re-Evaluation:  Nutrition Goals Re-Evaluation     Row Name 08/11/23 1619 09/06/23 1112           Goals   Current Weight 228 lb 6.3 oz (103.6 kg) 231 lb 14.8 oz (105.2 kg)      Comment Cr 1.37, lipids WNL, LDL 59, A1c WNL no new labs; most recent labs  Cr 1.37, lipids WNL, LDL 59, A1c WNL. He continues follow-up with anti-coag clinic.      Expected Outcome Patient has medical history of aortic aneurysm repair, HTN, CKD, Afib, obesity, OSA, CHF. He continues regular follow-up with the anti-coagulation clinic. His lipids remain well controlled. Patient will benefit from participation in intensive cardiac rehab for nutrition, exercise, and lifestyle modification. Goals in action. Patient has medical history of aortic aneurysm repair, HTN, CKD, Afib, obesity, OSA, CHF. He  continues to attend the [facility] education and nutrition series. He has started making many dietary changes including reduced saturated fat intake, increased dietary fiber, and reading food labels for sodium. He continues regular follow-up with the anti-coagulation clinic. His lipids remain well controlled. His family remains a good support for lifestyle changes. Patient will benefit from participation in intensive cardiac rehab for nutrition, exercise, and lifestyle modification.               Nutrition Goals Re-Evaluation:  Nutrition Goals Re-Evaluation     Row Name 08/11/23 1619 09/06/23 1112           Goals   Current Weight 228 lb 6.3 oz (103.6 kg) 231 lb 14.8 oz (105.2 kg)      Comment Cr 1.37, lipids WNL, LDL 59, A1c WNL no new labs; most recent labs  Cr 1.37, lipids WNL, LDL 59, A1c WNL. He continues follow-up with anti-coag clinic.      Expected Outcome Patient has medical history of aortic aneurysm repair, HTN, CKD, Afib, obesity, OSA, CHF. He continues regular follow-up with the anti-coagulation clinic. His lipids remain well controlled. Patient will benefit from participation in intensive cardiac rehab for nutrition, exercise, and lifestyle modification. Goals in action. Patient has medical history of aortic aneurysm  repair, HTN, CKD, Afib, obesity, OSA, CHF. He continues to attend the [employer] education and nutrition series. He has started making many dietary changes including reduced saturated fat intake, increased dietary fiber, and reading food labels for sodium. He continues regular follow-up with the anti-coagulation clinic. His lipids remain well controlled. His family remains a good support for lifestyle changes. Patient will benefit from participation in intensive cardiac rehab for nutrition, exercise, and lifestyle modification.               Nutrition Goals Discharge (Final Nutrition Goals Re-Evaluation):  Nutrition Goals Re-Evaluation - 09/06/23 1112       Goals    Current Weight 231 lb 14.8 oz (105.2 kg)    Comment no new labs; most recent labs  Cr 1.37, lipids WNL, LDL 59, A1c WNL. He continues follow-up with anti-coag clinic.    Expected Outcome Goals in action. Patient has medical history of aortic aneurysm repair, HTN, CKD, Afib, obesity, OSA, CHF. He continues to attend the [employer] education and nutrition series. He has started making many dietary changes including reduced saturated fat intake, increased dietary fiber, and reading food labels for sodium. He continues regular follow-up with the anti-coagulation clinic. His lipids remain well controlled. His family remains a good support for lifestyle changes. Patient will benefit from participation in intensive cardiac rehab for nutrition, exercise, and lifestyle modification.             Psychosocial: Target Goals: Acknowledge presence or absence of significant depression and/or stress, maximize coping skills, provide positive support system. Participant is able to verbalize types and ability to use techniques and skills needed for reducing stress and depression.  Initial Review & Psychosocial Screening:  Initial Psych Review & Screening - 08/03/23 1101       Initial Review   Current issues with Current Stress Concerns    Source of Stress Concerns Unable to participate in former interests or hobbies    Comments Patient started therapy with a counselor at [facility] on 07/29/23.      Family Dynamics   Good Support System? Yes    Comments Patient doesn't talk to friends or family when he's stressed but recently started talking to a counselor.      Barriers   Psychosocial barriers to participate in program The patient should benefit from training in stress management and relaxation.;Psychosocial barriers identified (see note)      Screening Interventions   Interventions Encouraged to exercise;Provide feedback about the scores to participant;To provide support and resources with identified  psychosocial needs    Expected Outcomes Short Term goal: Utilizing psychosocial counselor, staff and physician to assist with identification of specific Stressors or current issues interfering with healing process. Setting desired goal for each stressor or current issue identified.;Long Term Goal: Stressors or current issues are controlled or eliminated.;Short Term goal: Identification and review with participant of any Quality of Life or Depression concerns found by scoring the questionnaire.;Long Term goal: The participant improves quality of Life and PHQ9 Scores as seen by post scores and/or verbalization of changes             Quality of Life Scores:  Quality of Life - 10/22/23 1255       Quality of Life   Select Quality of Life      Quality of Life Scores   Health/Function Pre 16.1 %    Health/Function Post 8.6 %    Health/Function % Change -46.58 %    Socioeconomic Pre 20.75 %  Socioeconomic Post 13.29 %    Socioeconomic % Change  -35.95 %    Psych/Spiritual Pre 17 %    Psych/Spiritual Post 7.75 %    Psych/Spiritual % Change -54.41 %    Family Pre 26.4 %    Family Post 25.2 %    Family % Change -4.55 %    GLOBAL Pre 18.75 %    GLOBAL Post 11.95 %    GLOBAL % Change -36.27 %            Scores of 19 and below usually indicate a poorer quality of life in these areas.  A difference of  2-3 points is a clinically meaningful difference.  A difference of 2-3 points in the total score of the Quality of Life Index has been associated with significant improvement in overall quality of life, self-image, physical symptoms, and general health in studies assessing change in quality of life.  PHQ-9: Review Flowsheet  More data exists      08/11/2023 08/03/2023 06/02/2023 04/20/2023 03/25/2023  Depression screen PHQ 2/9  Decreased Interest 1 2 0 0 0  Down, Depressed, Hopeless 1 2 0 0 0  PHQ - 2 Score 2 4 0 0 0  Altered sleeping 2 1 0 1 -  Tired, decreased energy 2 2 0 1 -   Change in appetite 0 0 0 0 -  Feeling bad or failure about yourself  2 1 0 0 -  Trouble concentrating 1 0 - - -  Moving slowly or fidgety/restless 0 2 0 0 -  Suicidal thoughts 0 0 0 0 -  PHQ-9 Score 9 10 0 2 -  Difficult doing work/chores Somewhat difficult Somewhat difficult Not difficult at all Not difficult at all -   Interpretation of Total Score  Total Score Depression Severity:  1-4 = Minimal depression, 5-9 = Mild depression, 10-14 = Moderate depression, 15-19 = Moderately severe depression, 20-27 = Severe depression   Psychosocial Evaluation and Intervention:   Psychosocial Re-Evaluation:  Psychosocial Re-Evaluation     Row Name 08/11/23 1159 09/02/23 1320 09/29/23 0827 10/21/23 1449       Psychosocial Re-Evaluation   Current issues with Current Stress Concerns Current Stress Concerns Current Stress Concerns Current Stress Concerns    Comments Lemon did not voice any concerns or stressors on his first day of exercise, Turrell has not voiced any  concerns or stressors  during exercise at cardiac rehab. Talib says he feels a little stronger since participating in the program Kamren voiced stress concerns regarding health. Exercise is currently on hold. Yan has not voiced any increased concerns or stressors during exercise at cardiac rehab since his return. Malique will complete cardiac rehab on 11/06/23.    Expected Outcomes -- Goodwin will have decreased or controlled stress upon completion of cardiac rehab Avyukt will have decreased or controlled stress upon completion of cardiac rehab Arben will have decreased or controlled stress upon completion of cardiac rehab    Interventions Stress management education;Encouraged to attend Cardiac Rehabilitation for the exercise;Relaxation education Stress management education;Encouraged to attend Cardiac Rehabilitation for the exercise;Relaxation education Stress management education;Encouraged to attend Cardiac Rehabilitation for the  exercise;Relaxation education Stress management education;Encouraged to attend Cardiac Rehabilitation for the exercise;Relaxation education    Continue Psychosocial Services  Follow up required by staff Follow up required by staff Follow up required by staff Follow up required by staff      Initial Review   Source of Stress Concerns Unable to perform yard/household activities;Unable  to participate in former interests or hobbies Unable to perform yard/household activities;Unable to participate in former interests or hobbies Unable to perform yard/household activities;Unable to participate in former interests or hobbies Unable to perform yard/household activities;Unable to participate in former interests or hobbies    Comments will continue to monitor  and offer support as needed will continue to monitor  and offer support as needed will continue to monitor  and offer support as needed will continue to monitor  and offer support as needed             Psychosocial Discharge (Final Psychosocial Re-Evaluation):  Psychosocial Re-Evaluation - 10/21/23 1449       Psychosocial Re-Evaluation   Current issues with Current Stress Concerns    Comments Rainey has not voiced any increased concerns or stressors during exercise at cardiac rehab since his return. Rigo will complete cardiac rehab on 11/06/23.    Expected Outcomes Demetrick will have decreased or controlled stress upon completion of cardiac rehab    Interventions Stress management education;Encouraged to attend Cardiac Rehabilitation for the exercise;Relaxation education    Continue Psychosocial Services  Follow up required by staff      Initial Review   Source of Stress Concerns Unable to perform yard/household activities;Unable to participate in former interests or hobbies    Comments will continue to monitor  and offer support as needed             Vocational Rehabilitation: Provide vocational rehab assistance to qualifying  candidates.   Vocational Rehab Evaluation & Intervention:  Vocational Rehab - 08/03/23 1127       Initial Vocational Rehab Evaluation & Intervention   Assessment shows need for Vocational Rehabilitation Yes    Vocational Rehab Packet given to patient 08/03/23      Vocational Rehab Re-Evaulation   Comments Anticipating knee surgery but may be interested in vocational rehab services in the future.             Education: Education Goals: Education classes will be provided on a weekly basis, covering required topics. Participant will state understanding/return demonstration of topics presented.    Education     Row Name 08/11/23 1500     Education   Cardiac Education Topics P[employer]ikin   Customer Service Manager   Weekly Topic International Cuisine- Spotlight on the [employer] Zones   Instruction Review Code 1- Verbalizes Understanding   Class Start Time 1145   Class Stop Time 1221   Class Time Calculation (min) 36 min    Row Name 08/25/23 1500     Education   Cardiac Education Topics Pritikin   Customer Service Manager   Weekly Topic Powerhouse Plant-Based Proteins   Instruction Review Code 1- Verbalizes Understanding   Class Start Time 1145   Class Stop Time 1222   Class Time Calculation (min) 37 min    Row Name 09/01/23 1300     Education   Cardiac Education Topics Pritikin   Customer Service Manager   Weekly Topic Adding Flavor - Sodium-Free   Instruction Review Code 1- Verbalizes Understanding   Class Start Time 1140   Class Stop Time 1220   Class Time Calculation (min) 40 min            Core Videos: Exercise    Move It!  Clinical staff conducted  group or individual video education with verbal and written material and guidebook.  Patient learns the recommended Pritikin exercise program. Exercise with the goal of living a long, healthy life.  Some of the health benefits of exercise include controlled diabetes, healthier blood pressure levels, improved cholesterol levels, improved heart and lung capacity, improved sleep, and better body composition. Everyone should speak with their doctor before starting or changing an exercise routine.  Biomechanical Limitations Clinical staff conducted group or individual video education with verbal and written material and guidebook.  Patient learns how biomechanical limitations can impact exercise and how we can mitigate and possibly overcome limitations to have an impactful and balanced exercise routine.  Body Composition Clinical staff conducted group or individual video education with verbal and written material and guidebook.  Patient learns that body composition (ratio of muscle mass to fat mass) is a key component to assessing overall fitness, rather than body weight alone. Increased fat mass, especially visceral belly fat, can put us  at increased risk for metabolic syndrome, type 2 diabetes, heart disease, and even death. It is recommended to combine diet and exercise (cardiovascular and resistance training) to improve your body composition. Seek guidance from your physician and exercise physiologist before implementing an exercise routine.  Exercise Action Plan Clinical staff conducted group or individual video education with verbal and written material and guidebook.  Patient learns the recommended strategies to achieve and enjoy long-term exercise adherence, including variety, self-motivation, self-efficacy, and positive decision making. Benefits of exercise include fitness, good health, weight management, more energy, better sleep, less stress, and overall well-being.  Medical   Heart Disease Risk Reduction Clinical staff conducted group or individual video education with verbal and written material and guidebook.  Patient learns our heart is our most vital organ as it circulates oxygen,  nutrients, white blood cells, and hormones throughout the entire body, and carries waste away. Data supports a plant-based eating plan like the Pritikin Program for its effectiveness in slowing progression of and reversing heart disease. The video provides a number of recommendations to address heart disease.   Metabolic Syndrome and Belly Fat  Clinical staff conducted group or individual video education with verbal and written material and guidebook.  Patient learns what metabolic syndrome is, how it leads to heart disease, and how one can reverse it and keep it from coming back. You have metabolic syndrome if you have 3 of the following 5 criteria: abdominal obesity, high blood pressure, high triglycerides, low HDL cholesterol, and high blood sugar.  Hypertension and Heart Disease Clinical staff conducted group or individual video education with verbal and written material and guidebook.  Patient learns that high blood pressure, or hypertension, is very common in the [place] . Hypertension is largely due to excessive salt intake, but other important risk factors include being overweight, physical inactivity, drinking too much alcohol, smoking, and not eating enough potassium from fruits and vegetables. High blood pressure is a leading risk factor for heart attack, stroke, congestive heart failure, dementia, kidney failure, and premature death. Long-term effects of excessive salt intake include stiffening of the arteries and thickening of heart muscle and organ damage. Recommendations include ways to reduce hypertension and the risk of heart disease.  Diseases of Our Time - Focusing on Diabetes Clinical staff conducted group or individual video education with verbal and written material and guidebook.  Patient learns why the best way to stop diseases of our time is prevention, through food and other lifestyle changes. Medicine (such as prescription  pills and surgeries) is often only a Band-Aid on  the problem, not a long-term solution. Most common diseases of our time include obesity, type 2 diabetes, hypertension, heart disease, and cancer. The Pritikin Program is recommended and has been proven to help reduce, reverse, and/or prevent the damaging effects of metabolic syndrome.  Nutrition   Overview of the Pritikin Eating Plan  Clinical staff conducted group or individual video education with verbal and written material and guidebook.  Patient learns about the Pritikin Eating Plan for disease risk reduction. The Pritikin Eating Plan emphasizes a wide variety of unrefined, minimally-processed carbohydrates, like fruits, vegetables, whole grains, and legumes. Go, Caution, and Stop food choices are explained. Plant-based and lean animal proteins are emphasized. Rationale provided for low sodium intake for blood pressure control, low added sugars for blood sugar stabilization, and low added fats and oils for coronary artery disease risk reduction and weight management.  Calorie Density  Clinical staff conducted group or individual video education with verbal and written material and guidebook.  Patient learns about calorie density and how it impacts the Pritikin Eating Plan. Knowing the characteristics of the food you choose will help you decide whether those foods will lead to weight gain or weight loss, and whether you want to consume more or less of them. Weight loss is usually a side effect of the Pritikin Eating Plan because of its focus on low calorie-dense foods.  Label Reading  Clinical staff conducted group or individual video education with verbal and written material and guidebook.  Patient learns about the Pritikin recommended label reading guidelines and corresponding recommendations regarding calorie density, added sugars, sodium content, and whole grains.  Dining Out - Part 1  Clinical staff conducted group or individual video education with verbal and written material and  guidebook.  Patient learns that restaurant meals can be sabotaging because they can be so high in calories, fat, sodium, and/or sugar. Patient learns recommended strategies on how to positively address this and avoid unhealthy pitfalls.  Facts on Fats  Clinical staff conducted group or individual video education with verbal and written material and guidebook.  Patient learns that lifestyle modifications can be just as effective, if not more so, as many medications for lowering your risk of heart disease. A Pritikin lifestyle can help to reduce your risk of inflammation and atherosclerosis (cholesterol build-up, or plaque, in the artery walls). Lifestyle interventions such as dietary choices and physical activity address the cause of atherosclerosis. A review of the types of fats and their impact on blood cholesterol levels, along with dietary recommendations to reduce fat intake is also included.  Nutrition Action Plan  Clinical staff conducted group or individual video education with verbal and written material and guidebook.  Patient learns how to incorporate Pritikin recommendations into their lifestyle. Recommendations include planning and keeping personal health goals in mind as an important part of their success.  Healthy Mind-Set    Healthy Minds, Bodies, Hearts  Clinical staff conducted group or individual video education with verbal and written material and guidebook.  Patient learns how to identify when they are stressed. Video will discuss the impact of that stress, as well as the many benefits of stress management. Patient will also be introduced to stress management techniques. The way we think, act, and feel has an impact on our hearts.  How Our Thoughts Can Heal Our Hearts  Clinical staff conducted group or individual video education with verbal and written material and guidebook.  Patient learns that  negative thoughts can cause depression and anxiety. This can result in negative  lifestyle behavior and serious health problems. Cognitive behavioral therapy is an effective method to help control our thoughts in order to change and improve our emotional outlook.  Additional Videos:  Exercise    Improving Performance  Clinical staff conducted group or individual video education with verbal and written material and guidebook.  Patient learns to use a non-linear approach by alternating intensity levels and lengths of time spent exercising to help burn more calories and lose more body fat. Cardiovascular exercise helps improve heart health, metabolism, hormonal balance, blood sugar control, and recovery from fatigue. Resistance training improves strength, endurance, balance, coordination, reaction time, metabolism, and muscle mass. Flexibility exercise improves circulation, posture, and balance. Seek guidance from your physician and exercise physiologist before implementing an exercise routine and learn your capabilities and proper form for all exercise.  Introduction to Yoga  Clinical staff conducted group or individual video education with verbal and written material and guidebook.  Patient learns about yoga, a discipline of the coming together of mind, breath, and body. The benefits of yoga include improved flexibility, improved range of motion, better posture and core strength, increased lung function, weight loss, and positive self-image. Yoga's heart health benefits include lowered blood pressure, healthier heart rate, decreased cholesterol and triglyceride levels, improved immune function, and reduced stress. Seek guidance from your physician and exercise physiologist before implementing an exercise routine and learn your capabilities and proper form for all exercise.  Medical   Aging: Enhancing Your Quality of Life  Clinical staff conducted group or individual video education with verbal and written material and guidebook.  Patient learns key strategies and recommendations  to stay in good physical health and enhance quality of life, such as prevention strategies, having an advocate, securing a Health Care Proxy and Power of Attorney, and keeping a list of medications and system for tracking them. It also discusses how to avoid risk for bone loss.  Biology of Weight Control  Clinical staff conducted group or individual video education with verbal and written material and guidebook.  Patient learns that weight gain occurs because we consume more calories than we burn (eating more, moving less). Even if your body weight is normal, you may have higher ratios of fat compared to muscle mass. Too much body fat puts you at increased risk for cardiovascular disease, heart attack, stroke, type 2 diabetes, and obesity-related cancers. In addition to exercise, following the Pritikin Eating Plan can help reduce your risk.  Decoding Lab Results  Clinical staff conducted group or individual video education with verbal and written material and guidebook.  Patient learns that lab test reflects one measurement whose values change over time and are influenced by many factors, including medication, stress, sleep, exercise, food, hydration, pre-existing medical conditions, and more. It is recommended to use the knowledge from this video to become more involved with your lab results and evaluate your numbers to speak with your doctor.   Diseases of Our Time - Overview  Clinical staff conducted group or individual video education with verbal and written material and guidebook.  Patient learns that according to the CDC, 50% to 70% of chronic diseases (such as obesity, type 2 diabetes, elevated lipids, hypertension, and heart disease) are avoidable through lifestyle improvements including healthier food choices, listening to satiety cues, and increased physical activity.  Sleep Disorders Clinical staff conducted group or individual video education with verbal and written material and  guidebook.  Patient  learns how good quality and duration of sleep are important to overall health and well-being. Patient also learns about sleep disorders and how they impact health along with recommendations to address them, including discussing with a physician.  Nutrition  Dining Out - Part 2 Clinical staff conducted group or individual video education with verbal and written material and guidebook.  Patient learns how to plan ahead and communicate in order to maximize their dining experience in a healthy and nutritious manner. Included are recommended food choices based on the type of restaurant the patient is visiting.   Fueling a Banker conducted group or individual video education with verbal and written material and guidebook.  There is a strong connection between our food choices and our health. Diseases like obesity and type 2 diabetes are very prevalent and are in large-part due to lifestyle choices. The Pritikin Eating Plan provides plenty of food and hunger-curbing satisfaction. It is easy to follow, affordable, and helps reduce health risks.  Menu Workshop  Clinical staff conducted group or individual video education with verbal and written material and guidebook.  Patient learns that restaurant meals can sabotage health goals because they are often packed with calories, fat, sodium, and sugar. Recommendations include strategies to plan ahead and to communicate with the manager, chef, or server to help order a healthier meal.  Planning Your Eating Strategy  Clinical staff conducted group or individual video education with verbal and written material and guidebook.  Patient learns about the Pritikin Eating Plan and its benefit of reducing the risk of disease. The Pritikin Eating Plan does not focus on calories. Instead, it emphasizes high-quality, nutrient-rich foods. By knowing the characteristics of the foods, we choose, we can determine their calorie density  and make informed decisions.  Targeting Your Nutrition Priorities  Clinical staff conducted group or individual video education with verbal and written material and guidebook.  Patient learns that lifestyle habits have a tremendous impact on disease risk and progression. This video provides eating and physical activity recommendations based on your personal health goals, such as reducing LDL cholesterol, losing weight, preventing or controlling type 2 diabetes, and reducing high blood pressure.  Vitamins and Minerals  Clinical staff conducted group or individual video education with verbal and written material and guidebook.  Patient learns different ways to obtain key vitamins and minerals, including through a recommended healthy diet. It is important to discuss all supplements you take with your doctor.   Healthy Mind-Set    Smoking Cessation  Clinical staff conducted group or individual video education with verbal and written material and guidebook.  Patient learns that cigarette smoking and tobacco addiction pose a serious health risk which affects millions of people. Stopping smoking will significantly reduce the risk of heart disease, lung disease, and many forms of cancer. Recommended strategies for quitting are covered, including working with your doctor to develop a successful plan.  Culinary   Becoming a Set Designer conducted group or individual video education with verbal and written material and guidebook.  Patient learns that cooking at home can be healthy, cost-effective, quick, and puts them in control. Keys to cooking healthy recipes will include looking at your recipe, assessing your equipment needs, planning ahead, making it simple, choosing cost-effective seasonal ingredients, and limiting the use of added fats, salts, and sugars.  Cooking - Breakfast and Snacks  Clinical staff conducted group or individual video education with verbal and written material  and guidebook.  Patient  learns how important breakfast is to satiety and nutrition through the entire day. Recommendations include key foods to eat during breakfast to help stabilize blood sugar levels and to prevent overeating at meals later in the day. Planning ahead is also a key component.  Cooking - Educational Psychologist conducted group or individual video education with verbal and written material and guidebook.  Patient learns eating strategies to improve overall health, including an approach to cook more at home. Recommendations include thinking of animal protein as a side on your plate rather than center stage and focusing instead on lower calorie dense options like vegetables, fruits, whole grains, and plant-based proteins, such as beans. Making sauces in large quantities to freeze for later and leaving the skin on your vegetables are also recommended to maximize your experience.  Cooking - Healthy Salads and Dressing Clinical staff conducted group or individual video education with verbal and written material and guidebook.  Patient learns that vegetables, fruits, whole grains, and legumes are the foundations of the Pritikin Eating Plan. Recommendations include how to incorporate each of these in flavorful and healthy salads, and how to create homemade salad dressings. Proper handling of ingredients is also covered. Cooking - Soups and [employer] - Soups and Desserts Clinical staff conducted group or individual video education with verbal and written material and guidebook.  Patient learns that Pritikin soups and desserts make for easy, nutritious, and delicious snacks and meal components that are low in sodium, fat, sugar, and calorie density, while high in vitamins, minerals, and filling fiber. Recommendations include simple and healthy ideas for soups and desserts.   Overview     The Pritikin Solution Program Overview Clinical staff conducted group or individual video  education with verbal and written material and guidebook.  Patient learns that the results of the Pritikin Program have been documented in more than 100 articles published in peer-reviewed journals, and the benefits include reducing risk factors for (and, in some cases, even reversing) high cholesterol, high blood pressure, type 2 diabetes, obesity, and more! An overview of the three key pillars of the Pritikin Program will be covered: eating well, doing regular exercise, and having a healthy mind-set.  WORKSHOPS  Exercise: Exercise Basics: Building Your Action Plan Clinical staff led group instruction and group discussion with PowerPoint presentation and patient guidebook. To enhance the learning environment the use of posters, models and videos may be added. At the conclusion of this workshop, patients will comprehend the difference between physical activity and exercise, as well as the benefits of incorporating both, into their routine. Patients will understand the FITT (Frequency, Intensity, Time, and Type) principle and how to use it to build an exercise action plan. In addition, safety concerns and other considerations for exercise and cardiac rehab will be addressed by the presenter. The purpose of this lesson is to promote a comprehensive and effective weekly exercise routine in order to improve patients' overall level of fitness.   Managing Heart Disease: Your Path to a Healthier Heart Clinical staff led group instruction and group discussion with PowerPoint presentation and patient guidebook. To enhance the learning environment the use of posters, models and videos may be added.At the conclusion of this workshop, patients will understand the anatomy and physiology of the heart. Additionally, they will understand how Pritikin's three pillars impact the risk factors, the progression, and the management of heart disease.  The purpose of this lesson is to provide a high-level overview of the  heart,  heart disease, and how the Pritikin lifestyle positively impacts risk factors.  Exercise Biomechanics Clinical staff led group instruction and group discussion with PowerPoint presentation and patient guidebook. To enhance the learning environment the use of posters, models and videos may be added. Patients will learn how the structural parts of their bodies function and how these functions impact their daily activities, movement, and exercise. Patients will learn how to promote a neutral spine, learn how to manage pain, and identify ways to improve their physical movement in order to promote healthy living. The purpose of this lesson is to expose patients to common physical limitations that impact physical activity. Participants will learn practical ways to adapt and manage aches and pains, and to minimize their effect on regular exercise. Patients will learn how to maintain good posture while sitting, walking, and lifting.  Balance Training and Fall Prevention  Clinical staff led group instruction and group discussion with PowerPoint presentation and patient guidebook. To enhance the learning environment the use of posters, models and videos may be added. At the conclusion of this workshop, patients will understand the importance of their sensorimotor skills (vision, proprioception, and the vestibular system) in maintaining their ability to balance as they age. Patients will apply a variety of balancing exercises that are appropriate for their current level of function. Patients will understand the common causes for poor balance, possible solutions to these problems, and ways to modify their physical environment in order to minimize their fall risk. The purpose of this lesson is to teach patients about the importance of maintaining balance as they age and ways to minimize their risk of falling.  WORKSHOPS   Nutrition:  Fueling a Ship Broker led group instruction and  group discussion with PowerPoint presentation and patient guidebook. To enhance the learning environment the use of posters, models and videos may be added. Patients will review the foundational principles of the Pritikin Eating Plan and understand what constitutes a serving size in each of the food groups. Patients will also learn Pritikin-friendly foods that are better choices when away from home and review make-ahead meal and snack options. Calorie density will be reviewed and applied to three nutrition priorities: weight maintenance, weight loss, and weight gain. The purpose of this lesson is to reinforce (in a group setting) the key concepts around what patients are recommended to eat and how to apply these guidelines when away from home by planning and selecting Pritikin-friendly options. Patients will understand how calorie density may be adjusted for different weight management goals.  Mindful Eating  Clinical staff led group instruction and group discussion with PowerPoint presentation and patient guidebook. To enhance the learning environment the use of posters, models and videos may be added. Patients will briefly review the concepts of the Pritikin Eating Plan and the importance of low-calorie dense foods. The concept of mindful eating will be introduced as well as the importance of paying attention to internal hunger signals. Triggers for non-hunger eating and techniques for dealing with triggers will be explored. The purpose of this lesson is to provide patients with the opportunity to review the basic principles of the Pritikin Eating Plan, discuss the value of eating mindfully and how to measure internal cues of hunger and fullness using the Hunger Scale. Patients will also discuss reasons for non-hunger eating and learn strategies to use for controlling emotional eating.  Targeting Your Nutrition Priorities Clinical staff led group instruction and group discussion with PowerPoint presentation  and patient guidebook. To enhance  the learning environment the use of posters, models and videos may be added. Patients will learn how to determine their genetic susceptibility to disease by reviewing their family history. Patients will gain insight into the importance of diet as part of an overall healthy lifestyle in mitigating the impact of genetics and other environmental insults. The purpose of this lesson is to provide patients with the opportunity to assess their personal nutrition priorities by looking at their family history, their own health history and current risk factors. Patients will also be able to discuss ways of prioritizing and modifying the Pritikin Eating Plan for their highest risk areas  Menu  Clinical staff led group instruction and group discussion with PowerPoint presentation and patient guidebook. To enhance the learning environment the use of posters, models and videos may be added. Using menus brought in from [employer], or printed from [employer], patients will apply the Pritikin dining out guidelines that were presented in the [employer] video. Patients will also be able to practice these guidelines in a variety of provided scenarios. The purpose of this lesson is to provide patients with the opportunity to practice hands-on learning of the Pritikin Dining Out guidelines with actual menus and practice scenarios.  Label Reading Clinical staff led group instruction and group discussion with PowerPoint presentation and patient guidebook. To enhance the learning environment the use of posters, models and videos may be added. Patients will review and discuss the Pritikin label reading guidelines presented in Pritikin's Label Reading Educational series video. Using fool labels brought in from local grocery stores and markets, patients will apply the label reading guidelines and determine if the packaged food meet the Pritikin guidelines. The purpose of  this lesson is to provide patients with the opportunity to review, discuss, and practice hands-on learning of the Pritikin Label Reading guidelines with actual packaged food labels. Cooking School  Pritikin's [employer] are designed to teach patients ways to prepare quick, simple, and affordable recipes at home. The importance of nutrition's role in chronic disease risk reduction is reflected in its emphasis in the overall Pritikin program. By learning how to prepare essential core Pritikin Eating Plan recipes, patients will increase control over what they eat; be able to customize the flavor of foods without the use of added salt, sugar, or fat; and improve the quality of the food they consume. By learning a set of core recipes which are easily assembled, quickly prepared, and affordable, patients are more likely to prepare more healthy foods at home. These workshops focus on convenient breakfasts, simple entres, side dishes, and desserts which can be prepared with minimal effort and are consistent with nutrition recommendations for cardiovascular risk reduction. Cooking [employer] are taught by a armed forces logistics/support/administrative officer (RD) who has been trained by the [employer]. The chef or RD has a clear understanding of the importance of minimizing - if not completely eliminating - added fat, sugar, and sodium in recipes. Throughout the series of Cooking School Workshop sessions, patients will learn about healthy ingredients and efficient methods of cooking to build confidence in their capability to prepare    Cooking School weekly topics:  Adding Flavor- Sodium-Free  Fast and Healthy Breakfasts  Powerhouse Plant-Based Proteins  Satisfying Salads and Dressings  Simple Sides and Sauces  International Cuisine-Spotlight on the [employer] Zones  Delicious Desserts  Savory Soups  [employer] - Meals in a Snap  Tasty Appetizers and Snacks  Comforting Weekend  Breakfasts  One-Pot Wonders   Fast Evening Meals  Landscape Architect Your Pritikin Plate  WORKSHOPS   Healthy Mindset (Psychosocial):  Focused Goals, Sustainable Changes Clinical staff led group instruction and group discussion with PowerPoint presentation and patient guidebook. To enhance the learning environment the use of posters, models and videos may be added. Patients will be able to apply effective goal setting strategies to establish at least one personal goal, and then take consistent, meaningful action toward that goal. They will learn to identify common barriers to achieving personal goals and develop strategies to overcome them. Patients will also gain an understanding of how our mind-set can impact our ability to achieve goals and the importance of cultivating a positive and growth-oriented mind-set. The purpose of this lesson is to provide patients with a deeper understanding of how to set and achieve personal goals, as well as the tools and strategies needed to overcome common obstacles which may arise along the way.  From Head to Heart: The Power of a Healthy Outlook  Clinical staff led group instruction and group discussion with PowerPoint presentation and patient guidebook. To enhance the learning environment the use of posters, models and videos may be added. Patients will be able to recognize and describe the impact of emotions and mood on physical health. They will discover the importance of self-care and explore self-care practices which may work for them. Patients will also learn how to utilize the 4 C's to cultivate a healthier outlook and better manage stress and challenges. The purpose of this lesson is to demonstrate to patients how a healthy outlook is an essential part of maintaining good health, especially as they continue their cardiac rehab journey.  Healthy Sleep for a Healthy Heart Clinical staff led group instruction and group discussion with  PowerPoint presentation and patient guidebook. To enhance the learning environment the use of posters, models and videos may be added. At the conclusion of this workshop, patients will be able to demonstrate knowledge of the importance of sleep to overall health, well-being, and quality of life. They will understand the symptoms of, and treatments for, common sleep disorders. Patients will also be able to identify daytime and nighttime behaviors which impact sleep, and they will be able to apply these tools to help manage sleep-related challenges. The purpose of this lesson is to provide patients with a general overview of sleep and outline the importance of quality sleep. Patients will learn about a few of the most common sleep disorders. Patients will also be introduced to the concept of "sleep hygiene," and discover ways to self-manage certain sleeping problems through simple daily behavior changes. Finally, the workshop will motivate patients by clarifying the links between quality sleep and their goals of heart-healthy living.   Recognizing and Reducing Stress Clinical staff led group instruction and group discussion with PowerPoint presentation and patient guidebook. To enhance the learning environment the use of posters, models and videos may be added. At the conclusion of this workshop, patients will be able to understand the types of stress reactions, differentiate between acute and chronic stress, and recognize the impact that chronic stress has on their health. They will also be able to apply different coping mechanisms, such as reframing negative self-talk. Patients will have the opportunity to practice a variety of stress management techniques, such as deep abdominal breathing, progressive muscle relaxation, and/or guided imagery.  The purpose of this lesson is to educate patients on the role of stress in their lives and to provide healthy  techniques for coping with it.  Learning  Barriers/Preferences:  Learning Barriers/Preferences - 08/03/23 1103       Learning Barriers/Preferences   Learning Barriers None    Learning Preferences Audio;Written Material;Pictoral             Education Topics:  Knowledge Questionnaire Score:  Knowledge Questionnaire Score - 10/22/23 0836       Knowledge Questionnaire Score   Post Score 18/24             Core Components/Risk Factors/Patient Goals at Admission:  Personal Goals and Risk Factors at Admission - 08/03/23 1100       Core Components/Risk Factors/Patient Goals on Admission    Weight Management Yes;Obesity    Intervention Weight Management/Obesity: Establish reasonable short term and long term weight goals.;Obesity: Provide education and appropriate resources to help participant work on and attain dietary goals.    Admit Weight 227 lb 11.8 oz (103.3 kg)    Expected Outcomes Short Term: Continue to assess and modify interventions until short term weight is achieved;Long Term: Adherence to nutrition and physical activity/exercise program aimed toward attainment of established weight goal;Weight Loss: Understanding of general recommendations for a balanced deficit meal plan, which promotes 1-2 lb weight loss per week and includes a negative energy balance of [phone] kcal/d    Hypertension Yes    Intervention Provide education on lifestyle modifcations including regular physical activity/exercise, weight management, moderate sodium restriction and increased consumption of fresh fruit, vegetables, and low fat dairy, alcohol moderation, and smoking cessation.;Monitor prescription use compliance.    Expected Outcomes Short Term: Continued assessment and intervention until BP is < 140/22mm HG in hypertensive participants. < 130/37mm HG in hypertensive participants with diabetes, heart failure or chronic kidney disease.;Long Term: Maintenance of blood pressure at goal levels.    Lipids Yes    Intervention Provide  education and support for participant on nutrition & aerobic/resistive exercise along with prescribed medications to achieve LDL 70mg , HDL >40mg .    Expected Outcomes Short Term: Participant states understanding of desired cholesterol values and is compliant with medications prescribed. Participant is following exercise prescription and nutrition guidelines.;Long Term: Cholesterol controlled with medications as prescribed, with individualized exercise RX and with personalized nutrition plan. Value goals: LDL < 70mg , HDL > 40 mg.    Stress Yes    Intervention Offer individual and/or small group education and counseling on adjustment to heart disease, stress management and health-related lifestyle change. Teach and support self-help strategies.    Expected Outcomes Short Term: Participant demonstrates changes in health-related behavior, relaxation and other stress management skills, ability to obtain effective social support, and compliance with psychotropic medications if prescribed.;Long Term: Emotional wellbeing is indicated by absence of clinically significant psychosocial distress or social isolation.    Personal Goal Other Yes    Personal Goal Be as active as he was before. Be able to eat heart healthy foods that taste good or enjoy the foods he's used to eating. Be able to live a normal life without worrying or feeling down.    Intervention Develop an exercise action plan that he can follow to build strength and endurance, so that he can resume his previous activity level. Develop a nutrition action plan incorporating heart healthy foods that taste good that he can incorporate into his meal planning. Develop techniques he can use to help cope with stress.    Expected Outcomes Aidian will be able to resume his previous activites. He will comply with exercise and nutrition recommendations to  live a healthy lifestyle. He will be able to cope with stressors.             Core Components/Risk  Factors/Patient Goals Review:   Goals and Risk Factor Review     Row Name 08/11/23 1201 09/02/23 1325 09/29/23 0831 10/21/23 1454       Core Components/Risk Factors/Patient Goals Review   Personal Goals Review Weight Management/Obesity;Stress;Hypertension;Lipids Weight Management/Obesity;Stress;Hypertension;Lipids Weight Management/Obesity;Stress;Hypertension;Lipids Weight Management/Obesity;Stress;Hypertension;Lipids    Review Tellis started did well with exercise on 08/11/23. Vital signs were stable Dylin is doing  well with exercise. Vital signs have been stable. Kyrell has gained 1.9 kg since starting the program. Thiago is doing  well with exercise. Vital signs have been stable. Leiam has gained 3.0  kg since starting the program. Exercise is currently on hold due to complaints of feeling lightheaded. Lorris is doing well with exercise. Vital signs have been stable. Colbie has gained 2.3  kg since starting the program. Lanny has not voiced any further complaints of dizziness since his return. Jamar will complete cardiac rehab on 10/27/23.    Expected Outcomes Jeffrey will continue to participate in cardiac rehab for exercise nutrtion and lifestyle modications Lovie will continue to participate in cardiac rehab for exercise nutrtion and lifestyle modications [place] will continue to participate in cardiac rehab for exercise nutrtion and lifestyle modications Wendy will continue to participate in cardiac rehab for exercise nutrtion and lifestyle modications             Core Components/Risk Factors/Patient Goals at Discharge (Final Review):   Goals and Risk Factor Review - 10/21/23 1454       Core Components/Risk Factors/Patient Goals Review   Personal Goals Review Weight Management/Obesity;Stress;Hypertension;Lipids    Review Nahiem is doing well with exercise. Vital signs have been stable. Naszir has gained 2.3  kg since starting the program. Tycen has not voiced any further complaints of dizziness  since his return. Finbar will complete cardiac rehab on 10/27/23.    Expected Outcomes Olman will continue to participate in cardiac rehab for exercise nutrtion and lifestyle modications             ITP Comments:  ITP Comments     Row Name 08/03/23 1020 08/11/23 1158 09/02/23 1319 09/29/23 0826 10/21/23 1445   ITP Comments Medical Director- Dr. Wilbert Kussmaul, MD. Introduction to Pritikin Education Program  / Intensive Cardiac Rehab. Reviewed initial orientation folder with participant. 30 Day ITP Review. Rafael started cardiac rehab on 08/11/23. Keigen did well with exercise. 30 Day ITP Review. Kingsley has good attendance and participation in cardiac rehab. Ermal is off to a good start to exercise. 30 Day ITP Review. Heyden has good attendance and participation in cardiac rehab. Kel reported feeling lightheaded on 09/27/23. Exercise is currently on hold until after MD follow up. 30 Day ITP Review. Arath has good attendance and participation in cardiac rehab. Codi has returned to exercise and had no further complaints of dizziness. Dewayne is tenatively scheduled to complete cardiac rehab on 10/27/23.            Comments: See ITP Comments

## 2023-10-26 ENCOUNTER — Ambulatory Visit: Payer: Medicaid Other | Attending: Thoracic Surgery (Cardiothoracic Vascular Surgery)

## 2023-10-26 DIAGNOSIS — Z952 Presence of prosthetic heart valve: Secondary | ICD-10-CM

## 2023-10-26 DIAGNOSIS — Z5181 Encounter for therapeutic drug level monitoring: Secondary | ICD-10-CM

## 2023-10-26 DIAGNOSIS — I480 Paroxysmal atrial fibrillation: Secondary | ICD-10-CM | POA: Diagnosis not present

## 2023-10-26 LAB — POCT INR: INR: 2.2 (ref 2.0–3.0)

## 2023-10-26 NOTE — Patient Instructions (Signed)
 Take an extra 1/2 tablet of warfarin today and then START taking warfarin 3 TABLET DAILY EXCEPT 2 TABLETS ON Monday, Wednesday and Friday   Recheck INR in 2 weeks. Stay consistent with greens each week-2 times per week.  Coumadin  Clinic [phone] Amio stopped on 07/22/23.

## 2023-10-27 ENCOUNTER — Encounter (HOSPITAL_COMMUNITY)
Admission: RE | Admit: 2023-10-27 | Discharge: 2023-10-27 | Disposition: A | Discharge status: Home / Self Care | Payer: Medicaid Other | Source: Ambulatory Visit | Attending: Cardiology | Admitting: Cardiology

## 2023-10-27 VITALS — BP 118/70 | HR 87 | Ht 72.0 in | Wt 235.9 lb

## 2023-10-27 DIAGNOSIS — Z9889 Other specified postprocedural states: Secondary | ICD-10-CM

## 2023-10-27 DIAGNOSIS — Z952 Presence of prosthetic heart valve: Secondary | ICD-10-CM | POA: Diagnosis not present

## 2023-10-27 NOTE — Progress Notes (Signed)
 Discharge Progress Report  Patient Details  Name: CONNELLY NETTERVILLE MRN: [id number] Date of Birth: 04/13/1971 Referring Provider:   Flowsheet Row INTENSIVE CARDIAC REHAB ORIENT from 08/03/2023 in [facility]  Referring Provider Sheena Pugh, DO        Number of Visits: 32  Reason for Discharge:  Patient reached a stable level of exercise.  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  ADL UCSD:   Initial Exercise Prescription:  Initial Exercise Prescription - 08/03/23 1300       Date of Initial Exercise RX and Referring Provider   Date 08/03/23    Referring Provider Tobb, Kardie, DO    Expected Discharge Date 10/27/23      Recumbant Bike   Level 1    Watts 45    Minutes 15    METs 3.4      T5 Nustep   Level 2    SPM 85    Minutes 15    METs 3      Prescription Details   Frequency (times per week) 3    Duration Progress to 30 minutes of continuous aerobic without signs/symptoms of physical distress      Intensity   THRR 40-80% of Max Heartrate 68-135    Ratings of Perceived Exertion 11-13    Perceived Dyspnea 0-4      Progression   Progression Continue to progress workloads to maintain intensity without signs/symptoms of physical distress.      Resistance Training   Training Prescription Yes    Weight 5 lbs    Reps 10-15             Discharge Exercise Prescription (Final Exercise Prescription Changes):  Exercise Prescription Changes - 10/27/23 1027       Response to Exercise   Blood Pressure (Admit) 118/70    Blood Pressure (Exit) 118/72    Heart Rate (Admit) 87 bpm    Heart Rate (Exercise) 107 bpm    Heart Rate (Exit) 86 bpm    Rating of Perceived Exertion (Exercise) 11    Symptoms None    Comments Oisin completed  the cardiac rehab program today.    Duration Continue with 30 min of aerobic exercise without signs/symptoms of physical distress.    Intensity THRR unchanged      Progression   Progression Continue to progress workloads to maintain intensity without signs/symptoms of physical distress.    Average METs 2.7      Resistance Training   Training Prescription No    Weight Relaxation day, no weights.      Interval Training   Interval Training No      Recumbant Bike   Level 4    RPM 49    Watts 40    Minutes 15    METs 3      T5 Nustep   Level 4    SPM 99    Minutes 15    METs 2.5      Home Exercise Plan   Plans to continue exercise at Home (comment)   Walking. Stationary bike, and recumbent bike at home.   Frequency Add 2 additional days to program exercise sessions.    Initial Home Exercises Provided 08/18/23  Functional Capacity:  6 Minute Walk     Row Name 08/03/23 1143 10/18/23 1200       6 Minute Walk   Phase Initial Discharge    Distance 1161 feet 1143 feet    Distance % Change -- -1.55 %    Distance Feet Change -- -18 ft    Walk Time 6 minutes 6 minutes    # of Rest Breaks 0 0    MPH 2.2 2.1    METS 3.42 3.53    RPE 9 10    Perceived Dyspnea  1 --    VO2 Peak 11.97 12.37    Symptoms Yes (comment) Yes (comment)    Comments Mild shortness of breath. Back pain, chronic with walking, 3/10 on pain scale. chronic back pain 3/10    Resting HR 66 bpm 89 bpm    Resting BP 128/80 128/70    Resting Oxygen Saturation  100 % --    Exercise Oxygen Saturation  during 6 min walk 99 % --    Max Ex. HR 86 bpm 101 bpm    Max Ex. BP 132/76 138/66    2 Minute Post BP 110/80 --             Psychological, QOL, Others - Outcomes: PHQ 2/9:    10/25/2023    5:08 PM 08/11/2023    1:46 PM 08/03/2023   11:00 AM 06/02/2023   10:11 AM 04/20/2023   10:10 AM  Depression screen PHQ 2/9  Decreased Interest 3 1 2  0 0  Down, Depressed, Hopeless 2 1 2  0 0  PHQ -  2 Score 5 2 4  0 0  Altered sleeping 3 2 1  0 1  Tired, decreased energy 2 2 2  0 1  Change in appetite 0 0 0 0 0  Feeling bad or failure about yourself  2 2 1  0 0  Trouble concentrating 0 1 0    Moving slowly or fidgety/restless 1 0 2 0 0  Suicidal thoughts 0 0 0 0 0  PHQ-9 Score 13 9 10  0 2  Difficult doing work/chores Somewhat difficult Somewhat difficult Somewhat difficult Not difficult at all Not difficult at all    Quality of Life:  Quality of Life - 10/22/23 1255       Quality of Life   Select Quality of Life      Quality of Life Scores   Health/Function Pre 16.1 %    Health/Function Post 8.6 %    Health/Function % Change -46.58 %    Socioeconomic Pre 20.75 %    Socioeconomic Post 13.29 %    Socioeconomic % Change  -35.95 %    Psych/Spiritual Pre 17 %    Psych/Spiritual Post 7.75 %    Psych/Spiritual % Change -54.41 %    Family Pre 26.4 %    Family Post 25.2 %    Family % Change -4.55 %    GLOBAL Pre 18.75 %    GLOBAL Post 11.95 %    GLOBAL % Change -36.27 %             Personal Goals: Goals established at orientation with interventions provided to work toward goal.  Personal Goals and Risk Factors at Admission - 08/03/23 1100       Core Components/Risk Factors/Patient Goals on Admission    Weight Management Yes;Obesity    Intervention Weight Management/Obesity: Establish reasonable short term and long term weight goals.;Obesity: Provide education and appropriate resources to help participant  work on and attain dietary goals.    Admit Weight 227 lb 11.8 oz (103.3 kg)    Expected Outcomes Short Term: Continue to assess and modify interventions until short term weight is achieved;Long Term: Adherence to nutrition and physical activity/exercise program aimed toward attainment of established weight goal;Weight Loss: Understanding of general recommendations for a balanced deficit meal plan, which promotes 1-2 lb weight loss per week and includes a negative energy  balance of [phone] kcal/d    Hypertension Yes    Intervention Provide education on lifestyle modifcations including regular physical activity/exercise, weight management, moderate sodium restriction and increased consumption of fresh fruit, vegetables, and low fat dairy, alcohol moderation, and smoking cessation.;Monitor prescription use compliance.    Expected Outcomes Short Term: Continued assessment and intervention until BP is < 140/37mm HG in hypertensive participants. < 130/62mm HG in hypertensive participants with diabetes, heart failure or chronic kidney disease.;Long Term: Maintenance of blood pressure at goal levels.    Lipids Yes    Intervention Provide education and support for participant on nutrition & aerobic/resistive exercise along with prescribed medications to achieve LDL 70mg , HDL >40mg .    Expected Outcomes Short Term: Participant states understanding of desired cholesterol values and is compliant with medications prescribed. Participant is following exercise prescription and nutrition guidelines.;Long Term: Cholesterol controlled with medications as prescribed, with individualized exercise RX and with personalized nutrition plan. Value goals: LDL < 70mg , HDL > 40 mg.    Stress Yes    Intervention Offer individual and/or small group education and counseling on adjustment to heart disease, stress management and health-related lifestyle change. Teach and support self-help strategies.    Expected Outcomes Short Term: Participant demonstrates changes in health-related behavior, relaxation and other stress management skills, ability to obtain effective social support, and compliance with psychotropic medications if prescribed.;Long Term: Emotional wellbeing is indicated by absence of clinically significant psychosocial distress or social isolation.    Personal Goal Other Yes    Personal Goal Be as active as he was before. Be able to eat heart healthy foods that taste good or enjoy the  foods he's used to eating. Be able to live a normal life without worrying or feeling down.    Intervention Develop an exercise action plan that he can follow to build strength and endurance, so that he can resume his previous activity level. Develop a nutrition action plan incorporating heart healthy foods that taste good that he can incorporate into his meal planning. Develop techniques he can use to help cope with stress.    Expected Outcomes Kewon will be able to resume his previous activites. He will comply with exercise and nutrition recommendations to live a healthy lifestyle. He will be able to cope with stressors.              Personal Goals Discharge:  Goals and Risk Factor Review     Row Name 08/11/23 1201 09/02/23 1325 09/29/23 0831 10/21/23 1454       Core Components/Risk Factors/Patient Goals Review   Personal Goals Review Weight Management/Obesity;Stress;Hypertension;Lipids Weight Management/Obesity;Stress;Hypertension;Lipids Weight Management/Obesity;Stress;Hypertension;Lipids Weight Management/Obesity;Stress;Hypertension;Lipids    Review Hideo started did well with exercise on 08/11/23. Vital signs were stable Brazen is doing  well with exercise. Vital signs have been stable. Donatello has gained 1.9 kg since starting the program. Linville is doing  well with exercise. Vital signs have been stable. Akhil has gained 3.0  kg since starting the program. Exercise is currently on hold due to complaints of feeling lightheaded. Bette  is doing well with exercise. Vital signs have been stable. Jahad has gained 2.3  kg since starting the program. Lenwood has not voiced any further complaints of dizziness since his return. Sharif will complete cardiac rehab on 10/27/23.    Expected Outcomes Gilford will continue to participate in cardiac rehab for exercise nutrtion and lifestyle modications Ezekiah will continue to participate in cardiac rehab for exercise nutrtion and lifestyle modications Selmer will  continue to participate in cardiac rehab for exercise nutrtion and lifestyle modications Kandon will continue to participate in cardiac rehab for exercise nutrtion and lifestyle modications             Exercise Goals and Review:  Exercise Goals     Row Name 08/03/23 1040             Exercise Goals   Increase Physical Activity Yes       Intervention Provide advice, education, support and counseling about physical activity/exercise needs.;Develop an individualized exercise prescription for aerobic and resistive training based on initial evaluation findings, risk stratification, comorbidities and participant's personal goals.       Expected Outcomes Short Term: Attend rehab on a regular basis to increase amount of physical activity.;Long Term: Add in home exercise to make exercise part of routine and to increase amount of physical activity.;Long Term: Exercising regularly at least 3-5 days a week.       Increase Strength and Stamina Yes       Intervention Provide advice, education, support and counseling about physical activity/exercise needs.;Develop an individualized exercise prescription for aerobic and resistive training based on initial evaluation findings, risk stratification, comorbidities and participant's personal goals.       Expected Outcomes Short Term: Increase workloads from initial exercise prescription for resistance, speed, and METs.;Short Term: Perform resistance training exercises routinely during rehab and add in resistance training at home;Long Term: Improve cardiorespiratory fitness, muscular endurance and strength as measured by increased METs and functional capacity ( )       Able to understand and use rate of perceived exertion (RPE) scale Yes       Intervention Provide education and explanation on how to use RPE scale       Expected Outcomes Short Term: Able to use RPE daily in rehab to express subjective intensity level;Long Term:  Able to use RPE to guide  intensity level when exercising independently       Knowledge and understanding of Target Heart Rate Range (THRR) Yes       Intervention Provide education and explanation of THRR including how the numbers were predicted and where they are located for reference       Expected Outcomes Short Term: Able to state/look up THRR;Long Term: Able to use THRR to govern intensity when exercising independently;Short Term: Able to use daily as guideline for intensity in rehab       Able to check pulse independently Yes       Intervention Provide education and demonstration on how to check pulse in carotid and radial arteries.;Review the importance of being able to check your own pulse for safety during independent exercise       Expected Outcomes Short Term: Able to explain why pulse checking is important during independent exercise;Long Term: Able to check pulse independently and accurately       Understanding of Exercise Prescription Yes       Intervention Provide education, explanation, and written materials on patient's individual exercise prescription       Expected  Outcomes Short Term: Able to explain program exercise prescription;Long Term: Able to explain home exercise prescription to exercise independently                Exercise Goals Re-Evaluation:  Exercise Goals Re-Evaluation     Row Name 08/11/23 1121 08/18/23 1044 09/15/23 1045 10/15/23 1104 10/25/23 1030     Exercise Goal Re-Evaluation   Exercise Goals Review Increase Physical Activity;Increase Strength and Stamina;Able to understand and use rate of perceived exertion (RPE) scale;Knowledge and understanding of Target Heart Rate Range (THRR) Increase Physical Activity;Increase Strength and Stamina;Able to understand and use rate of perceived exertion (RPE) scale;Knowledge and understanding of Target Heart Rate Range (THRR);Understanding of Exercise Prescription Increase Physical Activity;Increase Strength and Stamina;Able to understand and  use rate of perceived exertion (RPE) scale;Knowledge and understanding of Target Heart Rate Range (THRR);Understanding of Exercise Prescription Increase Physical Activity;Increase Strength and Stamina;Able to understand and use rate of perceived exertion (RPE) scale;Knowledge and understanding of Target Heart Rate Range (THRR);Understanding of Exercise Prescription Increase Physical Activity;Increase Strength and Stamina;Able to understand and use rate of perceived exertion (RPE) scale;Knowledge and understanding of Target Heart Rate Range (THRR);Understanding of Exercise Prescription   Comments Broly was able to understand and use RPE scale appropriately. Reviewed MET scale and THRR with him. Reviewed exercise prescription with Bette. Discussed increasing walking duration, and he is amenable to this. Progress workloads at cardiac rehab to help build confidence with exercise. Increased level on NuStep and Recumbent Bike today to level 3.0. Lorne is making gradual progress with exercise. He does some walking at home but is limited by chronic knee and back pain. He needs bilateral knee replacement and may get cortisone injections to help with his knee pain. The knee pain impedes his ability to participate in some leisure activities because his walking is limited. We discussed carying up his workout at cardiac rehab, and he tried the recubent elliptical today and tolerated well. He will also increase his hand weights to 6 lbs next week. Dametrius feels comfortable exercising at cardiac rehab but still is short of breath walking at home. He has mentioned this to his doctors but no clear answer as to why he has mild symptoms with walking. We discussed weight bearing activity might be more challenging because of his knee pain. he is walking about 10-15 minutes and needs to take a rest break with walking. He has an upright stationary bike, a recumbent bike, and a universal weigth machine at home that he hasn't been using. We  discussed using either bike at least 1 or 2 days outside of cardiac rehab to see if he can achieve 30 minutes and if it's easier than walking to make that his mode of home exercise. We discussed using his weight machine at the lightest weight (10 lbs) and progressing as tolerated, 2-3 non-consecutive days upon completion of cardaic rehab. Anzel states he will continue therapy for his back  after cardiac rehab. Appointments for that have not yet been scheduled.   Expected Outcomes Progress workloads as tolerated to help increase cardiorespiratory fitness. Increase home walking duration 1-2 minutes each session with a goal of 30 minutes at least 2 days/week. Increase workloads as tolerated. Continue to progress workloads as tolerated. Jerico will start riding his bike 1-2 days/week in addition to exercise at cardiac rehab. Desiderio will resume rehab for his back upon completion if the cardiac rehab program.    Row Name 10/27/23 1043  Exercise Goal Re-Evaluation   Exercise Goals Review Increase Physical Activity;Increase Strength and Stamina;Able to understand and use rate of perceived exertion (RPE) scale;Knowledge and understanding of Target Heart Rate Range (THRR);Understanding of Exercise Prescription       Comments Nidal completed the cardiac rehab program today and made gradual progress with exercise. Walking is limited by chronic back and bilateral knee pain. He plans to ride his stationary and/or recumbent bike 30 minutes, 3 days/week. he will use his weight machine and plans to check with his cardiologist if he has any restrictions with the weights.       Expected Outcomes Kiel will exercise at least 30 minutes 3 days/week to maintain health and fitness gains.                Nutrition & Weight - Outcomes:  Pre Biometrics - 08/03/23 1020       Pre Biometrics   Waist Circumference 40.25 inches    Hip Circumference 43.5 inches    Waist to Hip Ratio 0.93 %    Triceps Skinfold 13  mm    % Body Fat 27.2 %    Grip Strength 54 kg    Flexibility 0 in    Single Leg Stand 24.37 seconds             Post Biometrics - 10/27/23 1006        Post  Biometrics   Height 6' (1.829 m)    Waist Circumference 41 inches    Hip Circumference 44 inches    Waist to Hip Ratio 0.93 %    Triceps Skinfold 15 mm    % Body Fat 28.5 %    Grip Strength 45 kg    Flexibility 0 in   did not attempt d/t chronic back pain   Single Leg Stand 30 seconds             Nutrition:  Nutrition Therapy & Goals - 09/06/23 1112       Nutrition Therapy   Diet Heart Healthy Diet    Drug/Food Interactions Statins/Certain Fruits;Coumadin /Vit K      Personal Nutrition Goals   Nutrition Goal Patient to identify strategies for reducing cardiovascular risk by attending the [facility] education and nutrition series weekly.   Goal in action.   Personal Goal #2 Patient to improve diet quality by using the plate method as a guide for meal planning to include lean protein/plant protein, fruits, vegetables, whole grains, nonfat dairy as part of a well-balanced diet.   goal in action.   Personal Goal #3 Patient to limit sodium to 1500mg  per day   goal in action.   Comments Goals in action. Patient has medical history of aortic aneurysm repair, HTN, CKD, Afib, obesity, OSA, CHF. He continues to attend the [facility] education and nutrition series. He has started making many dietary changes including reduced saturated fat intake, increased dietary fiber, and reading food labels for sodium. He continues regular follow-up with the anti-coagulation clinic. His lipids remain well controlled. His family remains a good support for lifestyle changes. Patient will benefit from participation in intensive cardiac rehab for nutrition, exercise, and lifestyle modification.      Intervention Plan   Intervention Prescribe, educate and counsel regarding individualized specific dietary modifications aiming towards targeted  core components such as weight, hypertension, lipid management, diabetes, heart failure and other comorbidities.;Nutrition handout(s) given to patient.    Expected Outcomes Long Term Goal: Adherence to prescribed nutrition plan.;Short Term Goal: Understand basic principles  of dietary content, such as calories, fat, sodium, cholesterol and nutrients.             Nutrition Discharge:  Nutrition Assessments - 08/12/23 1040       Rate Your Plate Scores   Pre Score 44             Education Questionnaire Score:  Knowledge Questionnaire Score - 10/22/23 0836       Knowledge Questionnaire Score   Post Score 18/24             Goals reviewed with patient; copy given to patient.Pt graduates from  Intensive/Traditional cardiac rehab program on 10/27/23 with completion of  27 exercise and  5 education sessions. Pt maintained good attendance and progressed nicely during their participation in rehab as evidenced by increased MET level.   Medication list reconciled. Repeat  PHQ score- 13 .  Pt has made some lifestyle changes and should be commended for his success. Aadin achieved his goals during cardiac rehab.   Pt plans to continue exercise at home riding his exercise bike 3-5 days a week. Jarone says that he has intermittent depression. QUALITY OF LIFE SCORE REVIEW  Pt completed Quality of Life survey as a participant in Cardiac Rehab.  Scores 21.0 or below are considered low.  Pt score very low in several areas Overall 11.95, Health and Function 8.6, socioeconomic 13.29, physiological and spiritual 7.75, family 25.20. Patient quality of life slightly altered by physical constraints which limits ability to perform as prior to recent cardiac illness. Rickard says he enjoyed participating in cardiac rehab. Kyser says that he still feels he can't do the things he used to do. Geraldo says he has had difficulty sleeping for several years. I encouraged Jaqua to discuss this with his primary care  providers. Sent a message to Dr Kandis.   Offered emotional support and reassurance. Porfirio says he is not working currently. Tadan says he still has the vocational rehab packet he was given at orientation. Encourage Zubair to review and fill out. Hadassah Elpidio Quan RN BSN

## 2023-11-01 ENCOUNTER — Other Ambulatory Visit: Payer: Self-pay | Admitting: Internal Medicine

## 2023-11-01 ENCOUNTER — Other Ambulatory Visit (HOSPITAL_COMMUNITY): Payer: Self-pay

## 2023-11-01 ENCOUNTER — Other Ambulatory Visit: Payer: Self-pay | Admitting: Physician Assistant

## 2023-11-01 DIAGNOSIS — I7121 Aneurysm of the ascending aorta, without rupture: Secondary | ICD-10-CM

## 2023-11-01 MED ORDER — ROSUVASTATIN CALCIUM 5 MG PO TABS
5.0000 mg | ORAL_TABLET | Freq: Every day | ORAL | 3 refills | Status: AC
  Filled 2023-11-01: qty 90, 90d supply, fill #0
  Filled 2024-02-02: qty 90, 90d supply, fill #1
  Filled 2024-05-19: qty 90, 90d supply, fill #2
  Filled 2024-08-28: qty 90, 90d supply, fill #3

## 2023-11-01 NOTE — Telephone Encounter (Signed)
 Medication sent to pharmacy

## 2023-11-04 ENCOUNTER — Emergency Department (HOSPITAL_BASED_OUTPATIENT_CLINIC_OR_DEPARTMENT_OTHER)
Admission: EM | Admit: 2023-11-04 | Discharge: 2023-11-04 | Disposition: A | Discharge status: Home / Self Care | Payer: Medicaid Other | Attending: Emergency Medicine | Admitting: Emergency Medicine

## 2023-11-04 ENCOUNTER — Emergency Department (HOSPITAL_BASED_OUTPATIENT_CLINIC_OR_DEPARTMENT_OTHER): Payer: Medicaid Other

## 2023-11-04 ENCOUNTER — Other Ambulatory Visit: Payer: Self-pay

## 2023-11-04 DIAGNOSIS — Z79899 Other long term (current) drug therapy: Secondary | ICD-10-CM | POA: Insufficient documentation

## 2023-11-04 DIAGNOSIS — I130 Hypertensive heart and chronic kidney disease with heart failure and stage 1 through stage 4 chronic kidney disease, or unspecified chronic kidney disease: Secondary | ICD-10-CM | POA: Diagnosis not present

## 2023-11-04 DIAGNOSIS — N189 Chronic kidney disease, unspecified: Secondary | ICD-10-CM | POA: Diagnosis not present

## 2023-11-04 DIAGNOSIS — R1032 Left lower quadrant pain: Secondary | ICD-10-CM | POA: Diagnosis not present

## 2023-11-04 DIAGNOSIS — N400 Enlarged prostate without lower urinary tract symptoms: Secondary | ICD-10-CM | POA: Diagnosis not present

## 2023-11-04 DIAGNOSIS — N200 Calculus of kidney: Secondary | ICD-10-CM | POA: Insufficient documentation

## 2023-11-04 DIAGNOSIS — I509 Heart failure, unspecified: Secondary | ICD-10-CM | POA: Diagnosis not present

## 2023-11-04 DIAGNOSIS — Z7982 Long term (current) use of aspirin: Secondary | ICD-10-CM | POA: Insufficient documentation

## 2023-11-04 DIAGNOSIS — K5730 Diverticulosis of large intestine without perforation or abscess without bleeding: Secondary | ICD-10-CM | POA: Diagnosis not present

## 2023-11-04 DIAGNOSIS — N132 Hydronephrosis with renal and ureteral calculous obstruction: Secondary | ICD-10-CM | POA: Diagnosis not present

## 2023-11-04 LAB — CBC
HCT: 43.2 % (ref 39.0–52.0)
Hemoglobin: 14 g/dL (ref 13.0–17.0)
MCH: 27.8 pg (ref 26.0–34.0)
MCHC: 32.4 g/dL (ref 30.0–36.0)
MCV: 85.9 fL (ref 80.0–100.0)
Platelets: 323 10*3/uL (ref 150–400)
RBC: 5.03 MIL/uL (ref 4.22–5.81)
RDW: 17.4 % — ABNORMAL HIGH (ref 11.5–15.5)
WBC: 9.5 10*3/uL (ref 4.0–10.5)
nRBC: 0 % (ref 0.0–0.2)

## 2023-11-04 LAB — COMPREHENSIVE METABOLIC PANEL
ALT: 31 U/L (ref 0–44)
AST: 26 U/L (ref 15–41)
Albumin: 4.2 g/dL (ref 3.5–5.0)
Alkaline Phosphatase: 62 U/L (ref 38–126)
Anion gap: 6 (ref 5–15)
BUN: 20 mg/dL (ref 6–20)
CO2: 26 mmol/L (ref 22–32)
Calcium: 8.9 mg/dL (ref 8.9–10.3)
Chloride: 106 mmol/L (ref 98–111)
Creatinine, Ser: 2.09 mg/dL — ABNORMAL HIGH (ref 0.61–1.24)
GFR, Estimated: 38 mL/min — ABNORMAL LOW (ref >60)
Glucose, Bld: 116 mg/dL — ABNORMAL HIGH (ref 70–99)
Potassium: 4.1 mmol/L (ref 3.5–5.1)
Sodium: 138 mmol/L (ref 135–145)
Total Bilirubin: 0.4 mg/dL (ref 0.0–1.2)
Total Protein: 7.5 g/dL (ref 6.5–8.1)

## 2023-11-04 LAB — URINALYSIS, ROUTINE W REFLEX MICROSCOPIC
Bacteria, UA: NONE SEEN
Bilirubin Urine: NEGATIVE
Glucose, UA: NEGATIVE mg/dL
Ketones, ur: NEGATIVE mg/dL
Leukocytes,Ua: NEGATIVE
Nitrite: NEGATIVE
Specific Gravity, Urine: 1.022 (ref 1.005–1.030)
pH: 6 (ref 5.0–8.0)

## 2023-11-04 LAB — LIPASE, BLOOD: Lipase: 23 U/L (ref 11–51)

## 2023-11-04 MED ORDER — MORPHINE SULFATE (PF) 4 MG/ML IV SOLN
4.0000 mg | Freq: Once | INTRAVENOUS | Status: AC
  Administered 2023-11-04: 4 mg via INTRAVENOUS
  Filled 2023-11-04: qty 1

## 2023-11-04 MED ORDER — ONDANSETRON HCL 4 MG PO TABS: 4.0000 mg | ORAL_TABLET | Freq: Four times a day (QID) | ORAL | 0 refills | Status: AC

## 2023-11-04 MED ORDER — ONDANSETRON HCL 4 MG/2ML IJ SOLN
4.0000 mg | Freq: Once | INTRAMUSCULAR | Status: AC
  Administered 2023-11-04: 4 mg via INTRAVENOUS
  Filled 2023-11-04: qty 2

## 2023-11-04 MED ORDER — SODIUM CHLORIDE 0.9 % IV BOLUS
500.0000 mL | Freq: Once | INTRAVENOUS | Status: AC
  Administered 2023-11-04: 500 mL via INTRAVENOUS

## 2023-11-04 MED ORDER — OXYCODONE-ACETAMINOPHEN 5-325 MG PO TABS: 1.0000 | ORAL_TABLET | Freq: Four times a day (QID) | ORAL | 0 refills | Status: DC | PRN

## 2023-11-04 NOTE — ED Notes (Signed)
Patient transported to CT 

## 2023-11-04 NOTE — ED Triage Notes (Addendum)
Abd pain since this AM. LLQ- into groin. Some dysuria. Afebrile. -N/-V. Thinners for recent valve rep. Denies stool color change-last BM today.

## 2023-11-04 NOTE — Discharge Instructions (Addendum)
It was a pleasure caring for you today.  As discussed, you will need to reach out to Dr. Dillard [place] urology office first thing tomorrow morning.  You may also alternate ibuprofen and Tylenol for pain and use Percocet for breakthrough pain.  Seek emergency care if experiencing any new or worsening symptoms.  Alternating between 650 mg Tylenol and 400 mg Advil: The best way to alternate taking Acetaminophen (example Tylenol) and Ibuprofen (example Advil/Motrin) is to take them 3 hours apart. For example, if you take ibuprofen at 6 am you can then take Tylenol at 9 am. You can continue this regimen throughout the day, making sure you do not exceed the recommended maximum dose for each drug.

## 2023-11-04 NOTE — ED Provider Notes (Signed)
[place] EMERGENCY DEPARTMENT AT [facility] Provider Note   CSN: [id number] Arrival date & time: 11/04/23  1350     History  No chief complaint on file.   Timothy Browning is a 53 y.o. male with PMHx aortic root aneurysm s/p repair 04/2023, CKD, GERD, systolic CHF, HTN, GI bleed 06/2023 who presents to ED concerned for LLQ pain since waking up earlier this morning. Patient also with some intermittent nausea that is not currently present. LLQ pain radiates backwards towards the rectum. Last BM today.   Denies fever, chest pain, dyspnea, cough, vomiting, diarrhea, dysuria, hematuria, hematochezia. Denies testicular swelling/skin changes.   HPI     Home Medications Prior to Admission medications   Medication Sig Start Date End Date Taking? Authorizing Provider  acetaminophen (TYLENOL) 500 MG tablet Take 1,000 mg by mouth every 6 (six) hours as needed.   Yes [provider]  aspirin EC 81 MG tablet Take 1 tablet (81 mg total) by mouth daily. Swallow whole. 05/14/23  Yes Roddenberry, Cecille Amsterdam, PA-C  cyclobenzaprine (FLEXERIL) 5 MG tablet Take 5 mg by mouth 3 (three) times daily as needed for muscle spasms.   Yes [provider]  meclizine (ANTIVERT) 25 MG tablet Take 1 tablet (25 mg total) by mouth 3 (three) times daily as needed for dizziness. 09/28/23  Yes Cheron Schaumann K, PA-C  metoprolol succinate (TOPROL-XL) 25 MG 24 hr tablet Take 0.5 tablets (12.5 mg total) by mouth daily with breakfast. 07/16/23 01/12/24 Yes Marrianne Mood, MD  oxyCODONE-acetaminophen (PERCOCET/ROXICET) 5-325 MG tablet Take 1 tablet by mouth every 6 (six) hours as needed for up to 5 doses for severe pain (pain score 7-10). 11/04/23  Yes Valrie Hart F, PA-C  rosuvastatin (CRESTOR) 5 MG tablet Take 1 tablet (5 mg total) by mouth daily. 11/01/23  Yes Marrianne Mood, MD  sildenafil (VIAGRA) 25 MG tablet Take 100 mg by mouth daily as needed for erectile dysfunction.   Yes [provider]  warfarin (COUMADIN) 2.5 MG tablet Take 2 tablets (5 mg total) by mouth daily. Or as directed by the [facility] Patient taking differently: Take 5 mg by mouth See admin instructions. * 2 tabs on Monday,Wednesday,Friday Then 3 tabs the other days of the week* 05/14/23 05/13/24 Yes Roddenberry, Cecille Amsterdam, PA-C      Allergies    Ibuprofen    Review of Systems   Review of Systems  Gastrointestinal:  Positive for abdominal pain.    Physical Exam Updated Vital Signs BP (!) 150/87   Pulse 79   Temp 98.8 F (37.1 C) (Oral)   Resp 18   SpO2 90%  Physical Exam Vitals and nursing note reviewed.  Constitutional:      General: He is not in acute distress.    Appearance: He is not ill-appearing or toxic-appearing.  HENT:     Head: Normocephalic and atraumatic.     Mouth/Throat:     Mouth: Mucous membranes are moist.  Eyes:     General: No scleral icterus.       Right eye: No discharge.        Left eye: No discharge.     Conjunctiva/sclera: Conjunctivae normal.  Cardiovascular:     Rate and Rhythm: Normal rate and regular rhythm.     Pulses: Normal pulses.     Heart sounds: Normal heart sounds. No murmur heard. Pulmonary:     Effort: Pulmonary effort is normal. No respiratory distress.     Breath sounds:  Normal breath sounds. No wheezing, rhonchi or rales.  Abdominal:     General: Abdomen is flat. Bowel sounds are normal. There is no distension.     Palpations: Abdomen is soft. There is no mass.     Tenderness: There is abdominal tenderness.     Comments: LLQ tenderness  Musculoskeletal:     Right lower leg: No edema.     Left lower leg: No edema.  Skin:    General: Skin is warm and dry.     Findings: No rash.  Neurological:     General: No focal deficit present.     Mental Status: He is alert and oriented to person, place, and time. Mental status is at baseline.  Psychiatric:        Mood and Affect: Mood normal.        Behavior: Behavior normal.     ED  Results / Procedures / Treatments   Labs (all labs ordered are listed, but only abnormal results are displayed) Labs Reviewed  URINALYSIS, ROUTINE W REFLEX MICROSCOPIC - Abnormal; Notable for the following components:      Result Value   Hgb urine dipstick MODERATE (*)    Protein, ur TRACE (*)    All other components within normal limits  CBC - Abnormal; Notable for the following components:   RDW 17.4 (*)    All other components within normal limits  COMPREHENSIVE METABOLIC PANEL - Abnormal; Notable for the following components:   Glucose, Bld 116 (*)    Creatinine, Ser 2.09 (*)    GFR, Estimated 38 (*)    All other components within normal limits  LIPASE, BLOOD    EKG None  Radiology CT ABDOMEN PELVIS WO CONTRAST Result Date: 11/04/2023 CLINICAL DATA:  Acute left lower quadrant abdominal pain. EXAM: CT ABDOMEN AND PELVIS WITHOUT CONTRAST TECHNIQUE: Multidetector CT imaging of the abdomen and pelvis was performed following the standard protocol without IV contrast. RADIATION DOSE REDUCTION: This exam was performed according to the departmental dose-optimization program which includes automated exposure control, adjustment of the mA and/or kV according to patient size and/or use of iterative reconstruction technique. COMPARISON:  July 04, 2023. FINDINGS: Lower chest: No acute abnormality. Hepatobiliary: No focal liver abnormality is seen. Status post cholecystectomy. No biliary dilatation. Pancreas: Unremarkable. No pancreatic ductal dilatation or surrounding inflammatory changes. Spleen: Status post splenectomy.  Splenules are again noted. Adrenals/Urinary Tract: Adrenal glands appear normal. Mild left hydroureteronephrosis is noted with perinephric stranding secondary to 9 mm calculus at left ureterovesical junction. Right kidney and ureter are unremarkable. Stomach/Bowel: Stomach is within normal limits. Appendix appears normal. No evidence of bowel wall thickening, distention, or  inflammatory changes. Sigmoid diverticulosis without inflammation. Vascular/Lymphatic: No significant vascular findings are present. No enlarged abdominal or pelvic lymph nodes. Reproductive: Mild prostatic enlargement. Other: Status post ventral hernia repair.  No ascites is noted. Musculoskeletal: No acute or significant osseous findings. IMPRESSION: Mild left hydroureteronephrosis is noted with perinephric stranding secondary to 9 mm calculus at left ureterovesical junction. Sigmoid diverticulosis without inflammation. Mild prostatic enlargement. Electronically Signed   By: Lupita Raider M.D.   On: 11/04/2023 16:37    Procedures Procedures    Medications Ordered in ED Medications  morphine (PF) 4 MG/ML injection 4 mg (4 mg Intravenous Given 11/04/23 1517)  ondansetron (ZOFRAN) injection 4 mg (4 mg Intravenous Given 11/04/23 1516)  sodium chloride 0.9 % bolus 500 mL (500 mLs Intravenous New Bag/Given 11/04/23 1750)    ED Course/ Medical Decision  Making/ A&P                                 Medical Decision Making Amount and/or Complexity of Data Reviewed Labs: ordered. Radiology: ordered.  Risk Prescription drug management.    This patient presents to the ED for concern of abdominal pain, this involves an extensive number of treatment options, and is a complaint that carries with it a high risk of complications and morbidity.  The differential diagnosis includes gastroenteritis, colitis, small bowel obstruction, appendicitis, cholecystitis, pancreatitis, nephrolithiasis, UTI, pyelonephritis   Co morbidities that complicate the patient evaluation  aortic root aneurysm s/p repair 04/2023, CKD, GERD, systolic CHF, HTN, GI bleed 06/2023   Additional history obtained:  Dr. Benito Mccreedy PCP   Problem List / ED Course / Critical interventions / Medication management  Patient presented for LLQ abdominal pain. On exam patient was tender to LLQ area. Denies any other infectious symptoms  currently. Patient afebrile with stable vitals. Rest of physical exam unremarkable. I Ordered, and personally interpreted labs. CBC without leukocytosis or anemia. CMP with elevation in Cr at 2.09 which is past baseline of 1.3. lipase within normal limits. UA with moderate hbg and trace protein. I ordered imaging studies including CT Abd/Pelvis w/o contrast: evaluate for structural/surgical etiology of patients' severe abdominal pain. I independently visualized and interpreted imaging and I agree with the radiologist interpretation of mild left hydroureteronephrosis with perinephric stranding secondary to 9mm calculus at left ureterovesical junction. Patient with hx CHF. Provided patient with pain control and IV fluids for elevation in Cr.  Consulted with Dr. Cardell Peach and shared pertinent lab/imaging results. Dr. Cardell Peach recommending pain control and follow up at his clinic tomorrow morning. I appreciate their help. Shared results and plan with patient. Educated patient to call urology clinic at [facility] tomorrow morning. Patient verbalized understanding of plan. Answered patient's questions.  Educated patient alternating Ibuprofen and Tylenol for pain control.  Will provide patient with a couple doses of Percocet for breakthrough pain. Staffed with Dr. Lynelle Doctor. I have reviewed the patients home medicines and have made adjustments as needed Patient afebrile with stable vitals.  Provided with return precautions.  Discharged in good condition.  Ddx: These are considered less likely due to history of present illness and physical exam. -gastroenteritis: No vomiting in ED; no fever; tolerating PO intake -colitis: Denies diarrhea, patient afebrile  -small bowel obstruction: Last BM today  -appendicitis: Negative McBurney point, rebound tenderness, psoas, obturator sign  -cholecystitis: Negative Murphy sign; liver enzymes within normal limits  -pancreatitis: No LUQ tenderness to palpation, lipase within normal  limits  -UTI/pyelonephritis: Denies urinary complaints    Social Determinants of Health:  none          Final Clinical Impression(s) / ED Diagnoses Final diagnoses:  Kidney stone    Rx / DC Orders ED Discharge Orders          Ordered    oxyCODONE-acetaminophen (PERCOCET/ROXICET) 5-325 MG tablet  Every 6 hours PRN        11/04/23 1831              Dorthy Cooler, [place] 11/04/23 Lyndal Pulley, MD 11/05/23 [phone]

## 2023-11-05 ENCOUNTER — Ambulatory Visit (HOSPITAL_BASED_OUTPATIENT_CLINIC_OR_DEPARTMENT_OTHER): Admit: 2023-11-05 | Payer: Medicaid Other | Admitting: Urology

## 2023-11-05 ENCOUNTER — Encounter (HOSPITAL_BASED_OUTPATIENT_CLINIC_OR_DEPARTMENT_OTHER): Payer: Self-pay | Admitting: Urology

## 2023-11-05 ENCOUNTER — Ambulatory Visit (HOSPITAL_COMMUNITY): Admission: RE | Admit: 2023-11-05 | Payer: Medicaid Other | Source: Ambulatory Visit

## 2023-11-05 ENCOUNTER — Ambulatory Visit: Payer: Self-pay | Admitting: Urology

## 2023-11-05 ENCOUNTER — Other Ambulatory Visit (HOSPITAL_COMMUNITY): Payer: Self-pay

## 2023-11-05 ENCOUNTER — Other Ambulatory Visit: Payer: Self-pay | Admitting: Urology

## 2023-11-05 ENCOUNTER — Encounter: Payer: Self-pay | Admitting: Pharmacist

## 2023-11-05 ENCOUNTER — Telehealth: Payer: Self-pay | Admitting: Cardiology

## 2023-11-05 DIAGNOSIS — I4891 Unspecified atrial fibrillation: Secondary | ICD-10-CM

## 2023-11-05 DIAGNOSIS — Z952 Presence of prosthetic heart valve: Secondary | ICD-10-CM

## 2023-11-05 DIAGNOSIS — N201 Calculus of ureter: Secondary | ICD-10-CM

## 2023-11-05 SURGERY — Surgical Case
Anesthesia: *Unknown

## 2023-11-05 SURGERY — LITHOTRIPSY, ESWL
Anesthesia: LOCAL | Laterality: Left

## 2023-11-05 MED ORDER — ENOXAPARIN SODIUM 150 MG/ML IJ SOSY
150.0000 mg | PREFILLED_SYRINGE | INTRAMUSCULAR | 1 refills | Status: DC
  Filled 2023-11-05: qty 3, 3d supply, fill #0

## 2023-11-05 MED ORDER — OXYCODONE HCL 5 MG PO TABS
5.0000 mg | ORAL_TABLET | Freq: Four times a day (QID) | ORAL | 0 refills | Status: AC | PRN
  Filled 2023-11-05: qty 28, 7d supply, fill #0

## 2023-11-05 MED ORDER — OXYCODONE HCL 5 MG PO TABS: 5.0000 mg | ORAL_TABLET | ORAL | 0 refills | Status: DC | PRN

## 2023-11-05 NOTE — Telephone Encounter (Signed)
Pharmacy please advise on holding Coumadin prior to left urinary stone lithotripsy scheduled for 11/08/2023. Thank you.

## 2023-11-05 NOTE — Addendum Note (Signed)
Addended by: Cheree Ditto on: 11/05/2023 01:34 PM   Modules accepted: Orders

## 2023-11-05 NOTE — Telephone Encounter (Signed)
   [facility] Pre-operative Risk Assessment    Request for surgical clearance:  What type of surgery is being performed? Lithotripsy for Left Ureter Stone   When is this surgery scheduled?  11/08/23  What type of clearance is required (medical clearance vs. Pharmacy clearance to hold med vs. Both)?  Both   Are there any medications that need to be held prior to surgery and how long? Please advise on blood thinners + possible Lovenox bridging   Practice name and name of physician performing surgery?  [facility]  Dr. Sherrill Raring   What is your office phone number  [phone]   7.   What is your office fax number? [phone]  8.   Anesthesia type (None, local, MAC, general?  Local    Rolly Pancake 11/05/2023, 12:13 PM  Request per Juanda Crumble, Clinical Nurse Supervisor--[facility].

## 2023-11-05 NOTE — Telephone Encounter (Signed)
Exercise tolerance > 4 METS at last OV. Tolerated cardiac rehab well. Per AHA/ACC guidelines, he is deemed acceptable risk for the planned procedure without additional cardiovascular testing.  Alver Sorrow, NP

## 2023-11-05 NOTE — Telephone Encounter (Signed)
Hi [name],  We just received a urgent clearance request for Timothy Browning who is scheduled to undergo a lithotripsy procedure on 11/08/2023.  He was last seen by you on 10/04/2023.  It looks like he was seen in the ED yesterday for kidney stone related pain.  Could you offer guidance on surgical clearance and please send your recommendations to the preop clearance box.  Thanks, Robin Searing, NP

## 2023-11-05 NOTE — Progress Notes (Signed)
Pre Procedure Litho Instructions Spoke with client face to face, wife with pt. Reviewed client hx, medications and allergy status Discussed what medications to hold until further notice (will contact clients cardiologist in letter/ note regarding Coumadin and ASA medications) Instructed on using a laxative for Mondays procedure NPO status discussed as well Wife will be accompanying the client on day of procedure Discussed appropriate attire for day of procedure as well Client informed will contact him later today with actual arrival time to the [facility] on Monday for his procedure. Opportunity for questions provided prior to leaving facility

## 2023-11-05 NOTE — Progress Notes (Signed)
Phone call made to Dr Kirtland Bouchard. [place], [facility] , spoke with office staff for Dr. Servando Salina, to have cardiology clearance and instructions regarding clients Coumadin and ASA medications. Requested stop / hold order and recommendations or orders for resuming these two medications post Litho for left ureter stone. Also any additional orders they wish to be implemented as well.

## 2023-11-05 NOTE — Progress Notes (Signed)
Talked with patient Arrival time 55. May have a sip of water before 0545 if needed.

## 2023-11-05 NOTE — Telephone Encounter (Signed)
Instructions for warfarin and enoxaparin:  11/05/23: Hold warfarin  11/06/23: Hold warfarin  11/07/23: Hold warfarin   11/08/23: Procedure Day - Resume warfarin in the evening or as directed by doctor   11/09/23: Inject enoxaparin 150mg  in the fatty abdominal tissue at least 2 inches from the belly button. Take warfarin as directed  11/10/23: Inject enoxaparin 150mg  in the fatty tissue every 12 hours and take warfarin as directed  11/11/23:  Return for INR

## 2023-11-05 NOTE — Telephone Encounter (Signed)
   Patient Name: Timothy Browning  DOB: 28-Nov-1970 MRN: [id number]  Primary Cardiologist: Thomasene Ripple, DO  Chart reviewed as part of pre-operative protocol coverage. Given past medical history and time since last visit, based on ACC/AHA guidelines, Timothy Browning is at acceptable risk for the planned procedure without further cardiovascular testing.   The patient was advised that if he develops new symptoms prior to surgery to contact our office to arrange for a follow-up visit, and he verbalized understanding.  Instructions for warfarin and enoxaparin:   11/05/23: Hold warfarin   11/06/23: Hold warfarin   11/07/23: Hold warfarin    11/08/23: Procedure Day - Resume warfarin in the evening or as directed by doctor    11/09/23: Inject enoxaparin 150mg  in the fatty abdominal tissue at least 2 inches from the belly button. Take warfarin as directed   11/10/23: Inject enoxaparin 150mg  in the fatty tissue every 12 hours and take warfarin as directed   11/11/23:  Return for INR  Clinical pharmacist contacted patient and went over instructions today.  I will route this recommendation to the requesting party via Epic fax function and remove from pre-op pool.  Please call with questions.  Napoleon Form, Leodis Rains, NP 11/05/2023, 1:37 PM

## 2023-11-08 ENCOUNTER — Encounter (HOSPITAL_BASED_OUTPATIENT_CLINIC_OR_DEPARTMENT_OTHER): Payer: Self-pay | Admitting: Urology

## 2023-11-08 ENCOUNTER — Other Ambulatory Visit: Payer: Self-pay

## 2023-11-08 ENCOUNTER — Other Ambulatory Visit (HOSPITAL_COMMUNITY): Payer: Self-pay

## 2023-11-08 ENCOUNTER — Ambulatory Visit (HOSPITAL_BASED_OUTPATIENT_CLINIC_OR_DEPARTMENT_OTHER)
Admission: RE | Admit: 2023-11-08 | Discharge: 2023-11-08 | Disposition: A | Discharge status: Home / Self Care | Payer: Medicaid Other | Attending: Urology | Admitting: Urology

## 2023-11-08 ENCOUNTER — Encounter (HOSPITAL_BASED_OUTPATIENT_CLINIC_OR_DEPARTMENT_OTHER)
Admission: RE | Disposition: A | Discharge status: Home / Self Care | Payer: Self-pay | Source: Home / Self Care | Attending: Urology

## 2023-11-08 ENCOUNTER — Ambulatory Visit (HOSPITAL_COMMUNITY): Payer: Medicaid Other

## 2023-11-08 DIAGNOSIS — N201 Calculus of ureter: Secondary | ICD-10-CM | POA: Diagnosis not present

## 2023-11-08 DIAGNOSIS — N200 Calculus of kidney: Secondary | ICD-10-CM | POA: Diagnosis not present

## 2023-11-08 DIAGNOSIS — Z7901 Long term (current) use of anticoagulants: Secondary | ICD-10-CM | POA: Insufficient documentation

## 2023-11-08 DIAGNOSIS — N132 Hydronephrosis with renal and ureteral calculous obstruction: Secondary | ICD-10-CM | POA: Insufficient documentation

## 2023-11-08 DIAGNOSIS — Z01818 Encounter for other preprocedural examination: Secondary | ICD-10-CM | POA: Diagnosis not present

## 2023-11-08 HISTORY — PX: EXTRACORPOREAL SHOCK WAVE LITHOTRIPSY: SHX1557

## 2023-11-08 LAB — PROTIME-INR
INR: 1.1 (ref 0.8–1.2)
Prothrombin Time: 14.6 s (ref 11.4–15.2)

## 2023-11-08 SURGERY — LITHOTRIPSY, ESWL
Anesthesia: LOCAL | Laterality: Left

## 2023-11-08 MED ORDER — CIPROFLOXACIN HCL 500 MG PO TABS
500.0000 mg | ORAL_TABLET | ORAL | Status: DC
  Administered 2023-11-08: 500 mg via ORAL

## 2023-11-08 MED ORDER — DIPHENHYDRAMINE HCL 25 MG PO CAPS
ORAL_CAPSULE | ORAL | Status: AC
  Filled 2023-11-08: qty 1

## 2023-11-08 MED ORDER — SODIUM CHLORIDE 0.9 % IV SOLN: INTRAVENOUS | Status: DC

## 2023-11-08 MED ORDER — DIPHENHYDRAMINE HCL 25 MG PO CAPS
25.0000 mg | ORAL_CAPSULE | ORAL | Status: AC
  Administered 2023-11-08: 25 mg via ORAL

## 2023-11-08 MED ORDER — DIAZEPAM 5 MG PO TABS
10.0000 mg | ORAL_TABLET | ORAL | Status: AC
  Administered 2023-11-08: 10 mg via ORAL

## 2023-11-08 MED ORDER — LEVOFLOXACIN 500 MG PO TABS: 500.0000 mg | ORAL_TABLET | ORAL | Status: DC

## 2023-11-08 MED ORDER — DIPHENHYDRAMINE HCL 25 MG PO CAPS: 25.0000 mg | ORAL_CAPSULE | ORAL | Status: DC

## 2023-11-08 MED ORDER — CIPROFLOXACIN HCL 500 MG PO TABS
ORAL_TABLET | ORAL | Status: AC
  Filled 2023-11-08: qty 1

## 2023-11-08 MED ORDER — DIAZEPAM 5 MG PO TABS
ORAL_TABLET | ORAL | Status: AC
  Filled 2023-11-08: qty 2

## 2023-11-08 MED ORDER — DIAZEPAM 5 MG PO TABS: 10.0000 mg | ORAL_TABLET | ORAL | Status: DC

## 2023-11-08 MED ORDER — TAMSULOSIN HCL 0.4 MG PO CAPS
0.4000 mg | ORAL_CAPSULE | Freq: Every day | ORAL | 1 refills | Status: DC
  Filled 2023-11-08: qty 14, 14d supply, fill #0
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
## 2023-11-08 NOTE — Op Note (Signed)
OPERATIVE NOTE   Patient Name: JULEN TU  MRN: [id number]   Date of Procedure: 11/08/23  Preoperative diagnosis:  Left ureteral calculus  Postoperative diagnosis:  Left ureteral calculus  Procedure:  Left shock wave lithotripsy  Attending: Milderd Meager, MD  Anesthesia: IV sedation  Indications:  See chart  Description of Procedure:  Per [facility] note scanned into chart.  Complications: None  Condition: Stable  Plan: D/C to home with instructions

## 2023-11-08 NOTE — Interval H&P Note (Signed)
History and Physical Interval Note:  11/08/2023 1:05 PM  Timothy Browning  has presented today for surgery, with the diagnosis of left uvj stone.  The various methods of treatment have been discussed with the patient and family. After consideration of risks, benefits and other options for treatment, the patient has consented to  Procedure(s): EXTRACORPOREAL SHOCK WAVE LITHOTRIPSY (ESWL) (Left) as a surgical intervention.  The patient's history has been reviewed, patient examined, no change in status, stable for surgery.  I have reviewed the patient's chart and labs.  Questions were answered to the patient's satisfaction.     Di Kindle

## 2023-11-09 ENCOUNTER — Ambulatory Visit: Payer: Medicaid Other

## 2023-11-09 ENCOUNTER — Encounter (HOSPITAL_BASED_OUTPATIENT_CLINIC_OR_DEPARTMENT_OTHER): Payer: Self-pay | Admitting: Urology

## 2023-11-11 ENCOUNTER — Other Ambulatory Visit (HOSPITAL_COMMUNITY): Payer: Self-pay

## 2023-11-11 ENCOUNTER — Ambulatory Visit: Payer: Medicaid Other | Attending: Thoracic Surgery (Cardiothoracic Vascular Surgery) | Admitting: *Deleted

## 2023-11-11 DIAGNOSIS — Z5181 Encounter for therapeutic drug level monitoring: Secondary | ICD-10-CM

## 2023-11-11 DIAGNOSIS — I480 Paroxysmal atrial fibrillation: Secondary | ICD-10-CM | POA: Diagnosis not present

## 2023-11-11 DIAGNOSIS — Z952 Presence of prosthetic heart valve: Secondary | ICD-10-CM

## 2023-11-11 DIAGNOSIS — I4891 Unspecified atrial fibrillation: Secondary | ICD-10-CM

## 2023-11-11 LAB — POCT INR: INR: 1.4 — AB (ref 2.0–3.0)

## 2023-11-11 MED ORDER — ENOXAPARIN SODIUM 150 MG/ML IJ SOSY
150.0000 mg | PREFILLED_SYRINGE | INTRAMUSCULAR | 1 refills | Status: DC
  Filled 2023-11-11: qty 10, 10d supply, fill #0

## 2023-11-11 NOTE — Patient Instructions (Addendum)
Description   Continue lovenox injections daily. Today take another 1/2 tablet of warfarin and tomorrow take 3 tablets of warfarin then continue taking warfarin 3 TABLET DAILY EXCEPT 2 TABLETS ON Monday, Wednesday and Friday   Recheck INR on Tuesday. Stay consistent with greens each week-2 times per week.  [facility] [phone] Amio stopped on 07/22/23.

## 2023-11-12 ENCOUNTER — Other Ambulatory Visit (HOSPITAL_COMMUNITY): Payer: Self-pay

## 2023-11-15 ENCOUNTER — Ambulatory Visit (INDEPENDENT_AMBULATORY_CARE_PROVIDER_SITE_OTHER): Payer: Medicaid Other | Admitting: Student

## 2023-11-15 ENCOUNTER — Other Ambulatory Visit: Payer: Self-pay

## 2023-11-15 VITALS — BP 128/83 | HR 100 | Temp 98.4°F | Ht 72.0 in | Wt 240.7 lb

## 2023-11-15 DIAGNOSIS — N179 Acute kidney failure, unspecified: Secondary | ICD-10-CM

## 2023-11-15 DIAGNOSIS — Z0000 Encounter for general adult medical examination without abnormal findings: Secondary | ICD-10-CM

## 2023-11-15 DIAGNOSIS — R310 Gross hematuria: Secondary | ICD-10-CM | POA: Diagnosis not present

## 2023-11-15 DIAGNOSIS — N201 Calculus of ureter: Secondary | ICD-10-CM

## 2023-11-15 DIAGNOSIS — N200 Calculus of kidney: Secondary | ICD-10-CM | POA: Diagnosis not present

## 2023-11-15 DIAGNOSIS — Z1159 Encounter for screening for other viral diseases: Secondary | ICD-10-CM | POA: Diagnosis not present

## 2023-11-15 HISTORY — DX: Acute kidney failure, unspecified: N17.9

## 2023-11-15 NOTE — Assessment & Plan Note (Signed)
Patient is agreeable to hepatitis C screening, orders are placed.

## 2023-11-15 NOTE — Progress Notes (Signed)
Established Patient Office Visit  Subjective   Patient ID: Timothy Browning, male    DOB: [birth year]  Age: 53 y.o. MRN: [id number]  Chief Complaint  Patient presents with   Follow-up    Timothy Browning is a 53 y.o. who presents to the clinic for an ED follow up.   Patient was evaluated emergency department 1/16 was found to have a 9 mm renal stone of the left ureterovesical junction and mild left hydronephrosis.  Patient underwent ESL of the left-sided stone on 1/20.  Today, he denies pain and reported that he is passing parts of the stone.  He does report hematuria that sometimes is dark red but does clear some with increased intake of water.  He also reports increased frequency since starting Flomax.  He will follow-up with his urologist on Thursday.  Please see problem based assessment and plan for additional details.    Patient Active Problem List   Diagnosis Date Noted   AKI (acute kidney injury) (HCC) 11/15/2023   Health care maintenance 11/15/2023   Arm pain, left 08/12/2023   Erectile dysfunction 07/14/2023   Hypertension    Encounter for therapeutic drug monitoring 07/13/2023   Hematochezia 07/08/2023   Benign neoplasm of cecum 07/08/2023   Benign neoplasm of descending colon 07/08/2023   Benign neoplasm of sigmoid colon 07/08/2023   Colon ulcer 07/08/2023   Anticoagulation management encounter 07/07/2023   Acute blood loss anemia 07/07/2023   Anticoagulated by anticoagulation treatment 07/07/2023   History of GI diverticular bleed 07/04/2023   Hematuria 06/02/2023   Hyperlipidemia 06/02/2023   Constipation 06/02/2023   Atrial fibrillation (HCC) 05/17/2023   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   Paroxysmal atrial fibrillation (HCC) 05/12/2023   Acute respiratory failure with hypoxia (HCC) 05/07/2023   Anxiety about health 04/21/2023   Left renal stone with hematuria s/p ESL 03/25/2023   OSA (obstructive sleep apnea) 01/27/2023   Bilateral ACL tear 12/25/2022    Chronic back pain 09/24/2022   Fatigue 08/27/2022   Chronic systolic congestive heart failure (HCC) 07/16/2022   Aortic aneurysm without rupture (HCC) 07/14/2022   Dizziness 07/03/2022   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  Cardiovascular:     Rate and Rhythm: Normal rate and regular rhythm.     Heart sounds: No murmur heard.    Comments: Mechanical Aortic valve auscultated.  Pulmonary:     Effort: Pulmonary effort is normal. No respiratory distress.     Breath sounds: No stridor. No wheezing or rhonchi.  Abdominal:     Palpations: Abdomen is soft.     Tenderness: There is no abdominal tenderness. There is no right CVA tenderness, left CVA tenderness or guarding.  Neurological:     Mental Status: He is alert.      No results found for any visits on 11/15/23.  Last CBC Lab Results  Component Value Date   WBC 9.5 11/04/2023   HGB 14.0 11/04/2023   HCT 43.2 11/04/2023   MCV 85.9 11/04/2023   MCH 27.8 11/04/2023   RDW 17.4 (H) 11/04/2023   PLT 323 11/04/2023   Last metabolic panel Lab Results  Component Value Date   GLUCOSE 116 (H) 11/04/2023   NA 138 11/04/2023   K 4.1 11/04/2023   CL 106 11/04/2023   CO2 26 11/04/2023   BUN 20 11/04/2023   CREATININE 2.09 (H) 11/04/2023   GFRNONAA 38 (L) 11/04/2023   CALCIUM 8.9 11/04/2023   PHOS 2.2 (L) 05/11/2023   PROT 7.5 11/04/2023   ALBUMIN 4.2 11/04/2023   LABGLOB 3.0  05/28/2023   AGRATIO 1.5 02/06/2020   BILITOT 0.4 11/04/2023   ALKPHOS 62 11/04/2023   AST 26 11/04/2023   ALT 31 11/04/2023   ANIONGAP 6 11/04/2023   Last lipids Lab Results  Component Value Date   CHOL 133 06/02/2023   HDL 61 06/02/2023   LDLCALC 59 06/02/2023   TRIG 62 06/02/2023   CHOLHDL 2.2 06/02/2023   Last hemoglobin A1c Lab Results  Component Value Date   HGBA1C 5.4 05/04/2023      The 10-year ASCVD risk score (Arnett DK, et al., 2019) is: 7.7%    Assessment & Plan:   Problem List Items Addressed This Visit       Genitourinary   Left renal stone with hematuria s/p ESL   Patient was evaluated emergency department 1/16 was found to have a 9 mm renal stone of the left ureterovesical junction and mild left hydronephrosis.  Patient underwent ESL of the left-sided stone on 1/20.  Today, he denies pain and reported that he is passing parts of the stone.  He does report hematuria that sometimes is dark red but does clear some with increased intake of water.  He also reports increased frequency since starting Flomax.  He will follow-up with his urologist on Thursday. Plan: -Will obtain CBC evaluate for anemia with patient's increased hematuria -Encouraged to follow up with urology -Provided education and handout on prevention of renal stones.      AKI (acute kidney injury) [facility])   Patient had a AKI superimposed on his CKD stage III.  It appears that his AKI was due to postrenal obstruction from the Left renal stone.  His serum creatinine was 2.09 which is increased from 1.51 the month prior. Plan: -BMP today -Encouraged hydration       Relevant Orders   Basic metabolic panel     Other   Hematuria - Primary   Relevant Orders   CBC no Diff   Health care maintenance   Patient is agreeable to hepatitis C screening, orders are placed.      Other Visit Diagnoses       Encounter for hepatitis C screening test for low risk patient       Relevant Orders    Hepatitis C Ab reflex to Quant PCR       Return in about 3 months (around 02/13/2024) for Chronic conditions .    Faith Rogue, DO

## 2023-11-15 NOTE — Patient Instructions (Signed)
Thank you, Timothy Browning for allowing [place] to provide your care today. Today we discussed your recent emergency department visit and procedure to remove the stone.    I have ordered the following labs for you:   Lab Orders         CBC no Diff         Basic metabolic panel         Follow up: 3 months    Remember: To follow up with your cardiologist and urologist.   Should you have any questions or concerns please call the internal medicine clinic at [phone].     Please note that our late policy has changed.  If you are more than 15 minutes late to your appointment, you may be asked to reschedule your appointment.  Dr. Hessie Diener, D.O. [facility]

## 2023-11-15 NOTE — Assessment & Plan Note (Signed)
Patient had a AKI superimposed on his CKD stage III.  It appears that his AKI was due to postrenal obstruction from the Left renal stone.  His serum creatinine was 2.09 which is increased from 1.51 the month prior. Plan: -BMP today -Encouraged hydration

## 2023-11-15 NOTE — Assessment & Plan Note (Signed)
Patient was evaluated emergency department 1/16 was found to have a 9 mm renal stone of the left ureterovesical junction and mild left hydronephrosis.  Patient underwent ESL of the left-sided stone on 1/20.  Today, he denies pain and reported that he is passing parts of the stone.  He does report hematuria that sometimes is dark red but does clear some with increased intake of water.  He also reports increased frequency since starting Flomax.  He will follow-up with his urologist on Thursday. Plan: -Will obtain CBC evaluate for anemia with patient's increased hematuria -Encouraged to follow up with urology -Provided education and handout on prevention of renal stones.

## 2023-11-16 ENCOUNTER — Ambulatory Visit: Payer: Medicaid Other | Attending: Thoracic Surgery (Cardiothoracic Vascular Surgery) | Admitting: *Deleted

## 2023-11-16 DIAGNOSIS — Z952 Presence of prosthetic heart valve: Secondary | ICD-10-CM | POA: Diagnosis not present

## 2023-11-16 DIAGNOSIS — I4891 Unspecified atrial fibrillation: Secondary | ICD-10-CM | POA: Diagnosis not present

## 2023-11-16 DIAGNOSIS — Z5181 Encounter for therapeutic drug level monitoring: Secondary | ICD-10-CM

## 2023-11-16 DIAGNOSIS — I480 Paroxysmal atrial fibrillation: Secondary | ICD-10-CM

## 2023-11-16 LAB — BASIC METABOLIC PANEL
BUN/Creatinine Ratio: 13 (ref 9–20)
BUN: 17 mg/dL (ref 6–24)
CO2: 19 mmol/L — ABNORMAL LOW (ref 20–29)
Calcium: 8.7 mg/dL (ref 8.7–10.2)
Chloride: 104 mmol/L (ref 96–106)
Creatinine, Ser: 1.33 mg/dL — ABNORMAL HIGH (ref 0.76–1.27)
Glucose: 87 mg/dL (ref 70–99)
Potassium: 4.1 mmol/L (ref 3.5–5.2)
Sodium: 137 mmol/L (ref 134–144)
eGFR: 65 mL/min/{1.73_m2} (ref >59)

## 2023-11-16 LAB — HCV AB W REFLEX TO QUANT PCR: HCV Ab: NONREACTIVE

## 2023-11-16 LAB — HCV INTERPRETATION

## 2023-11-16 LAB — CBC
Hematocrit: 43 % (ref 37.5–51.0)
Hemoglobin: 13.7 g/dL (ref 13.0–17.7)
MCH: 28.4 pg (ref 26.6–33.0)
MCHC: 31.9 g/dL (ref 31.5–35.7)
MCV: 89 fL (ref 79–97)
Platelets: 339 10*3/uL (ref 150–450)
RBC: 4.83 x10E6/uL (ref 4.14–5.80)
RDW: 15.5 % — ABNORMAL HIGH (ref 11.6–15.4)
WBC: 3.6 10*3/uL (ref 3.4–10.8)

## 2023-11-16 LAB — POCT INR: INR: 1.7 — AB (ref 2.0–3.0)

## 2023-11-16 NOTE — Patient Instructions (Signed)
Description   Continue lovenox injections daily (last syringe on Saturday). Today take another whole tablet of warfarin (total 4 tablets) and tomorrow take 3 tablets of warfarin then continue taking warfarin 3 TABLET DAILY EXCEPT 2 TABLETS ON Monday, Wednesday and Friday   Recheck INR on Monday.  Stay consistent with greens each week-2 times per week.  Coumadin Clinic [phone] Amio stopped on 07/22/23.

## 2023-11-18 ENCOUNTER — Ambulatory Visit (INDEPENDENT_AMBULATORY_CARE_PROVIDER_SITE_OTHER): Payer: Medicaid Other | Admitting: Urology

## 2023-11-18 ENCOUNTER — Other Ambulatory Visit (HOSPITAL_COMMUNITY): Payer: Self-pay

## 2023-11-18 ENCOUNTER — Encounter: Payer: Self-pay | Admitting: Urology

## 2023-11-18 ENCOUNTER — Ambulatory Visit (HOSPITAL_BASED_OUTPATIENT_CLINIC_OR_DEPARTMENT_OTHER)
Admission: RE | Admit: 2023-11-18 | Discharge: 2023-11-18 | Disposition: A | Discharge status: Home / Self Care | Payer: Medicaid Other | Source: Ambulatory Visit | Attending: Urology | Admitting: Urology

## 2023-11-18 VITALS — BP 149/91 | HR 78 | Ht 72.0 in | Wt 234.0 lb

## 2023-11-18 DIAGNOSIS — N201 Calculus of ureter: Secondary | ICD-10-CM

## 2023-11-18 DIAGNOSIS — N202 Calculus of kidney with calculus of ureter: Secondary | ICD-10-CM | POA: Diagnosis not present

## 2023-11-18 MED ORDER — TAMSULOSIN HCL 0.4 MG PO CAPS
0.4000 mg | ORAL_CAPSULE | Freq: Every day | ORAL | 1 refills | Status: DC
  Filled 2023-11-18: qty 14, 14d supply, fill #0

## 2023-11-18 NOTE — Progress Notes (Signed)
Internal Medicine Clinic Attending  Case discussed with the resident at the time of the visit.  We reviewed the resident's history and exam and pertinent patient test results.  I agree with the assessment, diagnosis, and plan of care documented in the resident's note.
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
## 2023-11-20 DIAGNOSIS — Z419 Encounter for procedure for purposes other than remedying health state, unspecified: Secondary | ICD-10-CM | POA: Diagnosis not present

## 2023-11-22 ENCOUNTER — Ambulatory Visit: Payer: Medicaid Other | Attending: Thoracic Surgery (Cardiothoracic Vascular Surgery) | Admitting: *Deleted

## 2023-11-22 ENCOUNTER — Other Ambulatory Visit (HOSPITAL_COMMUNITY): Payer: Self-pay

## 2023-11-22 ENCOUNTER — Other Ambulatory Visit: Payer: Self-pay

## 2023-11-22 DIAGNOSIS — Z952 Presence of prosthetic heart valve: Secondary | ICD-10-CM | POA: Diagnosis not present

## 2023-11-22 DIAGNOSIS — I4891 Unspecified atrial fibrillation: Secondary | ICD-10-CM

## 2023-11-22 DIAGNOSIS — Z5181 Encounter for therapeutic drug level monitoring: Secondary | ICD-10-CM

## 2023-11-22 DIAGNOSIS — I480 Paroxysmal atrial fibrillation: Secondary | ICD-10-CM

## 2023-11-22 LAB — POCT INR: INR: 1.9 — AB (ref 2.0–3.0)

## 2023-11-22 MED ORDER — WARFARIN SODIUM 2.5 MG PO TABS
7.5000 mg | ORAL_TABLET | Freq: Every day | ORAL | 3 refills | Status: DC
  Filled 2023-11-22: qty 90, fill #0
  Filled 2023-11-22: qty 90, 30d supply, fill #0

## 2023-11-22 NOTE — Patient Instructions (Signed)
Description   Today take another whole tablet of warfarin (total 3 tablets) and tomorrow take 4 tablets of warfarin then START taking warfarin 3 TABLETS DAILY EXCEPT 2 TABLETS ON Monday. Recheck INR in 1 week.  Stay consistent with greens each week-2 times per week.  Coumadin Clinic [phone] Amio stopped on 07/22/23.

## 2023-11-24 LAB — CALCULI, WITH PHOTOGRAPH (CLINICAL LAB)
Calcium Oxalate Dihydrate: 20 %
Calcium Oxalate Monohydrate: 80 %
Weight Calculi: 26 mg

## 2023-11-26 LAB — URINALYSIS, ROUTINE W REFLEX MICROSCOPIC
Bilirubin, UA: NEGATIVE
Glucose, UA: NEGATIVE
Ketones, UA: NEGATIVE
Leukocytes,UA: NEGATIVE
Nitrite, UA: NEGATIVE
Protein,UA: NEGATIVE
Specific Gravity, UA: 1.02 (ref 1.005–1.030)
Urobilinogen, Ur: 0.2 mg/dL (ref 0.2–1.0)
pH, UA: 6.5 (ref 5.0–7.5)

## 2023-11-26 LAB — MICROSCOPIC EXAMINATION

## 2023-11-29 ENCOUNTER — Ambulatory Visit: Payer: Medicaid Other | Attending: Thoracic Surgery (Cardiothoracic Vascular Surgery)

## 2023-11-29 DIAGNOSIS — Z5181 Encounter for therapeutic drug level monitoring: Secondary | ICD-10-CM | POA: Diagnosis not present

## 2023-11-29 DIAGNOSIS — I480 Paroxysmal atrial fibrillation: Secondary | ICD-10-CM

## 2023-11-29 DIAGNOSIS — Z952 Presence of prosthetic heart valve: Secondary | ICD-10-CM

## 2023-11-29 LAB — POCT INR: INR: 2.4 (ref 2.0–3.0)

## 2023-11-29 NOTE — Patient Instructions (Signed)
 Today take another whole tablet of warfarin (total 3 tablets)  then START taking warfarin 3 TABLETS DAILY. Recheck INR in 1 week.  Stay consistent with greens each week-2 times per week.  Coumadin  Clinic [phone] Amio stopped on 07/22/23.

## 2023-12-01 ENCOUNTER — Ambulatory Visit: Payer: Medicaid Other | Admitting: Urology

## 2023-12-03 ENCOUNTER — Other Ambulatory Visit (HOSPITAL_COMMUNITY): Payer: Self-pay

## 2023-12-03 ENCOUNTER — Ambulatory Visit (INDEPENDENT_AMBULATORY_CARE_PROVIDER_SITE_OTHER): Payer: Medicaid Other | Admitting: Urology

## 2023-12-03 ENCOUNTER — Ambulatory Visit (HOSPITAL_BASED_OUTPATIENT_CLINIC_OR_DEPARTMENT_OTHER)
Admission: RE | Admit: 2023-12-03 | Discharge: 2023-12-03 | Disposition: A | Discharge status: Home / Self Care | Payer: Medicaid Other | Source: Ambulatory Visit | Attending: Urology | Admitting: Urology

## 2023-12-03 ENCOUNTER — Encounter: Payer: Self-pay | Admitting: Urology

## 2023-12-03 VITALS — BP 144/87 | HR 94 | Ht 72.0 in | Wt 234.0 lb

## 2023-12-03 DIAGNOSIS — N201 Calculus of ureter: Secondary | ICD-10-CM | POA: Insufficient documentation

## 2023-12-03 LAB — URINALYSIS, ROUTINE W REFLEX MICROSCOPIC
Bilirubin, UA: NEGATIVE
Glucose, UA: NEGATIVE
Ketones, UA: NEGATIVE
Leukocytes,UA: NEGATIVE
Nitrite, UA: NEGATIVE
Protein,UA: NEGATIVE
Specific Gravity, UA: >1.03 — ABNORMAL HIGH (ref 1.005–1.030)
Urobilinogen, Ur: 0.2 mg/dL (ref 0.2–1.0)
pH, UA: 5.5 (ref 5.0–7.5)

## 2023-12-03 LAB — MICROSCOPIC EXAMINATION

## 2023-12-03 MED ORDER — TAMSULOSIN HCL 0.4 MG PO CAPS
0.4000 mg | ORAL_CAPSULE | Freq: Every day | ORAL | 1 refills | Status: DC
  Filled 2023-12-03: qty 14, 14d supply, fill #0
  Filled 2023-12-21: qty 14, 14d supply, fill #1
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
## 2023-12-07 ENCOUNTER — Ambulatory Visit: Payer: Medicaid Other | Attending: Cardiovascular Disease

## 2023-12-07 DIAGNOSIS — Z5181 Encounter for therapeutic drug level monitoring: Secondary | ICD-10-CM

## 2023-12-07 DIAGNOSIS — Z952 Presence of prosthetic heart valve: Secondary | ICD-10-CM | POA: Diagnosis not present

## 2023-12-07 DIAGNOSIS — I480 Paroxysmal atrial fibrillation: Secondary | ICD-10-CM

## 2023-12-07 LAB — POCT INR: INR: 2.4 (ref 2.0–3.0)

## 2023-12-07 NOTE — Patient Instructions (Signed)
 Take 3.5 tablets tomorrow only then continue taking warfarin 3 Tablets Daily. Recheck INR in 2 weeks.  Stay consistent with greens each week-2 times per week.  Coumadin Clinic [phone] Amio stopped on 07/22/23.

## 2023-12-14 ENCOUNTER — Other Ambulatory Visit: Payer: Self-pay

## 2023-12-14 ENCOUNTER — Telehealth: Payer: Self-pay | Admitting: *Deleted

## 2023-12-14 ENCOUNTER — Ambulatory Visit (INDEPENDENT_AMBULATORY_CARE_PROVIDER_SITE_OTHER): Payer: Medicaid Other | Admitting: Urology

## 2023-12-14 ENCOUNTER — Encounter: Payer: Self-pay | Admitting: Urology

## 2023-12-14 ENCOUNTER — Ambulatory Visit (HOSPITAL_BASED_OUTPATIENT_CLINIC_OR_DEPARTMENT_OTHER)
Admission: RE | Admit: 2023-12-14 | Discharge: 2023-12-14 | Disposition: A | Discharge status: Home / Self Care | Payer: Medicaid Other | Source: Ambulatory Visit | Attending: Urology | Admitting: Urology

## 2023-12-14 VITALS — BP 141/87 | HR 77 | Ht 72.0 in | Wt 234.0 lb

## 2023-12-14 DIAGNOSIS — N200 Calculus of kidney: Secondary | ICD-10-CM | POA: Diagnosis not present

## 2023-12-14 DIAGNOSIS — N201 Calculus of ureter: Secondary | ICD-10-CM | POA: Insufficient documentation

## 2023-12-14 LAB — URINALYSIS, ROUTINE W REFLEX MICROSCOPIC
Bilirubin, UA: NEGATIVE
Glucose, UA: NEGATIVE
Ketones, UA: NEGATIVE
Leukocytes,UA: NEGATIVE
Nitrite, UA: NEGATIVE
Protein,UA: NEGATIVE
Specific Gravity, UA: 1.025 (ref 1.005–1.030)
Urobilinogen, Ur: 0.2 mg/dL (ref 0.2–1.0)
pH, UA: 6 (ref 5.0–7.5)

## 2023-12-14 LAB — MICROSCOPIC EXAMINATION
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
## 2023-12-14 NOTE — Telephone Encounter (Signed)
   Pre-operative Risk Assessment    Patient Name: SINJIN AMERO  DOB: [birth year] MRN: [id number]   Date of last office visit: 10/04/23 Gillian Shields, NP Date of next office visit: 01/13/24 DR. TOBB   Request for Surgical Clearance    Procedure:   URETEROSCOPY, LASER LITHOTRIPSY, URETERAL STENT    Date of Surgery:  Clearance TBD                                Surgeon:  DR. Di Kindle Surgeon's Group or Practice Name:  [facility] Phone number:  [phone] Fax number:  [phone]   Type of Clearance Requested:   - Medical  - Pharmacy:  Hold Aspirin and Warfarin (Coumadin) NEED LOVENOX BRIDGING ?    Type of Anesthesia:  General    Additional requests/questions:    Elpidio Anis   12/14/2023, 6:01 PM

## 2023-12-16 NOTE — Telephone Encounter (Signed)
 Pt is scheduled to see Dr. Servando Salina 01/13/24, clearance can be addressed at that time.  Will let the requesting surgeon's office know.

## 2023-12-16 NOTE — Telephone Encounter (Signed)
   Name: Timothy Browning  DOB: April 12, 1971  MRN: [id number]  Primary Cardiologist: Thomasene Ripple, DO   Preoperative team, please contact this patient and set up a phone call appointment for further preoperative risk assessment. Please obtain consent and complete medication review. Thank you for your help.  I confirm that guidance regarding antiplatelet and oral anticoagulation therapy has been completed and, if necessary, noted below.  Per office protocol, patient can hold warfarin for 5 days prior to procedure.     Patient WILL need bridging with Lovenox (enoxaparin) around procedure.   Will send to coumadin clinic as he will need a bridge. Patient has hx of diverticular bleeding in 2024 but was bridged without issue in Jan 2025  I also confirmed the patient resides in the state of [place]. As per [facility] Medical Board telemedicine laws, the patient must reside in the state in which the provider is licensed.   Denyce Robert, NP 12/16/2023, 11:57 AM [facility]

## 2023-12-16 NOTE — Telephone Encounter (Signed)
 Patient with diagnosis of mechanical AVR and afib on warfarin for anticoagulation.    Procedure:  URETEROSCOPY, LASER LITHOTRIPSY, URETERAL STENT   Date of procedure: TBD  CrCl 83 ml/min Platelet count 339  Per office protocol, patient can hold warfarin for 5 days prior to procedure.    Patient WILL need bridging with Lovenox (enoxaparin) around procedure.  Will send to coumadin clinic as he will need a bridge. Patient has hx of diverticular bleeding in 2024 but was bridged without issue in Jan 2025.  **This guidance is not considered finalized until pre-operative APP has relayed final recommendations.**

## 2023-12-18 DIAGNOSIS — Z419 Encounter for procedure for purposes other than remedying health state, unspecified: Secondary | ICD-10-CM | POA: Diagnosis not present

## 2023-12-20 ENCOUNTER — Ambulatory Visit (HOSPITAL_COMMUNITY): Admission: RE | Admit: 2023-12-20 | Source: Ambulatory Visit

## 2023-12-21 ENCOUNTER — Ambulatory Visit: Payer: Self-pay | Admitting: Urology

## 2023-12-21 ENCOUNTER — Ambulatory Visit: Payer: Medicaid Other | Attending: Cardiovascular Disease | Admitting: *Deleted

## 2023-12-21 ENCOUNTER — Other Ambulatory Visit (HOSPITAL_COMMUNITY): Payer: Self-pay

## 2023-12-21 DIAGNOSIS — I480 Paroxysmal atrial fibrillation: Secondary | ICD-10-CM | POA: Diagnosis not present

## 2023-12-21 DIAGNOSIS — Z5181 Encounter for therapeutic drug level monitoring: Secondary | ICD-10-CM | POA: Diagnosis not present

## 2023-12-21 DIAGNOSIS — Z952 Presence of prosthetic heart valve: Secondary | ICD-10-CM | POA: Diagnosis not present

## 2023-12-21 DIAGNOSIS — I4891 Unspecified atrial fibrillation: Secondary | ICD-10-CM | POA: Diagnosis not present

## 2023-12-21 DIAGNOSIS — N201 Calculus of ureter: Secondary | ICD-10-CM

## 2023-12-21 LAB — POCT INR: INR: 3.1 — AB (ref 2.0–3.0)

## 2023-12-21 MED ORDER — ENOXAPARIN SODIUM 150 MG/ML IJ SOSY
150.0000 mg | PREFILLED_SYRINGE | INTRAMUSCULAR | 1 refills | Status: DC
  Filled 2023-12-21: qty 10, 10d supply, fill #0

## 2023-12-21 NOTE — Patient Instructions (Addendum)
 Description   Tomorrow take 2 tablets of warfarin only (already taken today's dose) then continue taking warfarin 3 tablets daily. Recheck INR in 1 week post procedure.  Stay consistent with greens each week-2 times per week.  Coumadin Clinic [phone] Amio stopped on 07/22/23.      12/29/23: Last dose of warfarin.  12/30/23: No warfarin or enoxaparin (Lovenox).  12/31/23: Inject enoxaparin 150mg  in the fatty abdominal tissue at least 2 inches from the belly button daily at 8am and rotate sites. No warfarin.  01/01/24: Inject enoxaparin in the fatty tissue daily at 8am and rotate sites. No warfarin.  01/02/24: Inject enoxaparin in the fatty tissue daily at 8am and rotate sites. No warfarin.  01/03/24: Inject enoxaparin in the fatty tissue daily at 8am and rotate sites. No warfarin.  01/04/24: Procedure Day - No enoxaparin - Resume normal dose warfarin in the evening or as directed by doctor.   01/05/24: Resume enoxaparin inject in the fatty tissue daily at 8am and rotate sites and take warfarin.  01/06/24: Inject enoxaparin in the fatty tissue daily at 8am and rotate sites and take warfarin.  01/07/24: Inject enoxaparin in the fatty tissue daily at 8am and rotate sites and take warfarin.  01/08/24: Inject enoxaparin in the fatty tissue daily at 8am and rotate sites and take warfarin.  01/09/24: Inject enoxaparin in the fatty tissue daily at 8am and rotate sites and take warfarin.  01/10/24:  Inject enoxaparin in the fatty tissue at 8am and report to warfarin appt to check INR.

## 2023-12-25 ENCOUNTER — Telehealth: Payer: Self-pay | Admitting: Nurse Practitioner

## 2023-12-25 ENCOUNTER — Other Ambulatory Visit: Payer: Self-pay | Admitting: Nurse Practitioner

## 2023-12-25 DIAGNOSIS — I4891 Unspecified atrial fibrillation: Secondary | ICD-10-CM

## 2023-12-25 DIAGNOSIS — Z5181 Encounter for therapeutic drug level monitoring: Secondary | ICD-10-CM

## 2023-12-25 MED ORDER — WARFARIN SODIUM 2.5 MG PO TABS
ORAL_TABLET | ORAL | 3 refills | Status: DC
Start: 2023-12-25 — End: 2024-02-28

## 2023-12-25 NOTE — Telephone Encounter (Signed)
   Pt called answering service b/c he ran out of warfarin and his refills are through the [facility] outpt pharmacy, which is closed.  At his request, I sent rx for warfarin 2.5 mg, 3 tabs daily or as directed by anticoag clinic based on INR, #90, and 3 refills to [facility] [address] in [place].  Caller verbalized understanding and was grateful for the call back.  Nicolasa Ducking, NP 12/25/2023, 10:15 AM

## 2023-12-28 ENCOUNTER — Encounter: Payer: Self-pay | Admitting: Emergency Medicine

## 2023-12-28 ENCOUNTER — Ambulatory Visit: Payer: Medicaid Other | Attending: Emergency Medicine | Admitting: Emergency Medicine

## 2023-12-28 ENCOUNTER — Ambulatory Visit: Attending: Emergency Medicine

## 2023-12-28 ENCOUNTER — Encounter (HOSPITAL_COMMUNITY): Payer: Self-pay

## 2023-12-28 VITALS — BP 120/78 | HR 88 | Ht 73.0 in | Wt 241.0 lb

## 2023-12-28 DIAGNOSIS — Z01810 Encounter for preprocedural cardiovascular examination: Secondary | ICD-10-CM | POA: Diagnosis not present

## 2023-12-28 DIAGNOSIS — R002 Palpitations: Secondary | ICD-10-CM

## 2023-12-28 DIAGNOSIS — Z952 Presence of prosthetic heart valve: Secondary | ICD-10-CM

## 2023-12-28 DIAGNOSIS — I480 Paroxysmal atrial fibrillation: Secondary | ICD-10-CM

## 2023-12-28 DIAGNOSIS — I4891 Unspecified atrial fibrillation: Secondary | ICD-10-CM

## 2023-12-28 DIAGNOSIS — G4733 Obstructive sleep apnea (adult) (pediatric): Secondary | ICD-10-CM

## 2023-12-28 DIAGNOSIS — I5022 Chronic systolic (congestive) heart failure: Secondary | ICD-10-CM

## 2023-12-28 DIAGNOSIS — I10 Essential (primary) hypertension: Secondary | ICD-10-CM

## 2023-12-28 DIAGNOSIS — R0609 Other forms of dyspnea: Secondary | ICD-10-CM

## 2023-12-28 NOTE — Patient Instructions (Signed)
 SURGICAL WAITING ROOM VISITATION  Patients having surgery or a procedure may have no more than 2 support people in the waiting area - these visitors may rotate.    Children under the age of 41 must have an adult with them who is not the patient.  Due to an increase in RSV and influenza rates and associated hospitalizations, children ages 45 and under may not visit patients in [facility] hospitals.  Visitors with respiratory illnesses are discouraged from visiting and should remain at home.  If the patient needs to stay at the hospital during part of their recovery, the visitor guidelines for inpatient rooms apply. Pre-op nurse will coordinate an appropriate time for 1 support person to accompany patient in pre-op.  This support person may not rotate.    Please refer to the [facility] website for the visitor guidelines for Inpatients (after your surgery is over and you are in a regular room).       Your procedure is scheduled on: 01-04-24   Report to [facility] Main Entrance    Report to admitting at    1045  AM   Call this number if you have problems the morning of surgery [phone]   Do not eat food :After Midnight.   After Midnight you may have the following liquids until _0700   _____ AM/ DAY OF SURGERY  then nothing by mouth  Water Non-Citrus Juices (without pulp, NO RED-Apple, White grape, White cranberry) Black Coffee (NO MILK/CREAM OR CREAMERS, sugar ok)  Clear Tea (NO MILK/CREAM OR CREAMERS, sugar ok) regular and decaf                             Plain Jell-O (NO RED)                                           Fruit ices (not with fruit pulp, NO RED)                                     Popsicles (NO RED)                                                               Sports drinks like Gatorade (NO RED)                          If you have questions, please contact your surgeon's office.   FOLLOW  ANY ADDITIONAL PRE OP INSTRUCTIONS YOU RECEIVED FROM YOUR  SURGEON'S OFFICE!!!     Oral Hygiene is also important to reduce your risk of infection.                                    Remember - BRUSH YOUR TEETH THE MORNING OF SURGERY WITH YOUR REGULAR TOOTHPASTE  DENTURES WILL BE REMOVED PRIOR TO SURGERY PLEASE DO NOT APPLY "Poly grip" OR ADHESIVES!!!   Do NOT smoke after Midnight   Stop  all vitamins and herbal supplements 7 days before surgery.   Take these medicines the morning of surgery with A SIP OF WATER: Tamsulosin, rosuvastatin, metoprolol, oxycodone if needed, meclizine if needed, Flexril if needed    Bring CPAP mask and tubing day of surgery.                              You may not have any metal on your body including hair pins, jewelry, and body piercing             Do not wear lotions, powders, perfumes/cologne, or deodorant               Men may shave face and neck.   Do not bring valuables to the hospital. [facility] IS NOT             RESPONSIBLE   FOR VALUABLES.   Contacts, glasses, dentures or bridgework may not be worn into surgery.   Bring small overnight bag day of surgery.   DO NOT BRING YOUR HOME MEDICATIONS TO THE HOSPITAL. PHARMACY WILL DISPENSE MEDICATIONS LISTED ON YOUR MEDICATION LIST TO YOU DURING YOUR ADMISSION IN THE HOSPITAL!    Patients discharged on the day of surgery will not be allowed to drive home.  Someone NEEDS to stay with you for the first 24 hours after anesthesia.   Special Instructions: Bring a copy of your healthcare power of attorney and living will documents the day of surgery if you haven't scanned them before.              Please read over the following fact sheets you were given: IF YOU HAVE QUESTIONS ABOUT YOUR PRE-OP INSTRUCTIONS PLEASE CALL [phone]   I. If you test positive for Covid or have been in contact with anyone that has tested positive in the last 10 days please notify you surgeon.    [facility] - Preparing for Surgery Before surgery, you can play an important  role.  Because skin is not sterile, your skin needs to be as free of germs as possible.  You can reduce the number of germs on your skin by washing with CHG (chlorahexidine gluconate) soap before surgery.  CHG is an antiseptic cleaner which kills germs and bonds with the skin to continue killing germs even after washing. Please DO NOT use if you have an allergy to CHG or antibacterial soaps.  If your skin becomes reddened/irritated stop using the CHG and inform your nurse when you arrive at Short Stay. Do not shave (including legs and underarms) for at least 48 hours prior to the first CHG shower.  You may shave your face/neck. Please follow these instructions carefully:  1.  Shower with CHG Soap the night before surgery and the  morning of Surgery.  2.  If you choose to wash your hair, wash your hair first as usual with your  normal  shampoo.  3.  After you shampoo, rinse your hair and body thoroughly to remove the  shampoo.                           4.  Use CHG as you would any other liquid soap.  You can apply chg directly  to the skin and wash                       Gently with  a scrungie or clean washcloth.  5.  Apply the CHG Soap to your body ONLY FROM THE NECK DOWN.   Do not use on face/ open                           Wound or open sores. Avoid contact with eyes, ears mouth and genitals (private parts).                       Wash face,  Genitals (private parts) with your normal soap.             6.  Wash thoroughly, paying special attention to the area where your surgery  will be performed.  7.  Thoroughly rinse your body with warm water from the neck down.  8.  DO NOT shower/wash with your normal soap after using and rinsing off  the CHG Soap.                9.  Pat yourself dry with a clean towel.            10.  Wear clean pajamas.            11.  Place clean sheets on your bed the night of your first shower and do not  sleep with pets. Day of Surgery : Do not apply any lotions/deodorants  the morning of surgery.  Please wear clean clothes to the hospital/surgery center.  FAILURE TO FOLLOW THESE INSTRUCTIONS MAY RESULT IN THE CANCELLATION OF YOUR SURGERY PATIENT SIGNATURE_________________________________  NURSE SIGNATURE__________________________________  ________________________________________________________________________

## 2023-12-28 NOTE — Patient Instructions (Addendum)
 Medication Instructions:  INCREASE YOUR METOPROLOL SUCCINATE TO 25 (WHOLE TABLET) ONCE DAILY.   Lab Work: NONE   Testing/Procedures: Your physician has requested that you have an ECHOCARDIOGRAM. Echocardiography is a painless test that uses sound waves to create images of your heart. It provides your doctor with information about the size and shape of your heart and how well your heart's chambers and valves are working. This procedure takes approximately one hour. There are no restrictions for this procedure. Please do NOT wear cologne, perfume, aftershave, or lotions (deodorant is allowed). Please arrive 15 minutes prior to your appointment time.  Please note: We ask at that you not bring children with you during ultrasound (echo/ vascular) testing. Due to room size and safety concerns, children are not allowed in the ultrasound rooms during exams. Our front office staff cannot provide observation of children in our lobby area while testing is being conducted. An adult accompanying a patient to their appointment will only be allowed in the ultrasound room at the discretion of the ultrasound technician under special circumstances. We apologize for any inconvenience.  ZIO XT- Long Term Monitor Instructions  Your physician has requested you wear a ZIO patch monitor for 14 days.  This is a single patch monitor. [employer] supplies one patch monitor per enrollment. Additional stickers are not available. Please do not apply patch if you will be having a Nuclear Stress Test,  Echocardiogram, Cardiac CT, MRI, or Chest Xray during the period you would be wearing the  monitor. The patch cannot be worn during these tests. You cannot remove and re-apply the  ZIO XT patch monitor.  Your ZIO patch monitor will be mailed 3 day USPS to your address on file. It may take 3-5 days  to receive your monitor after you have been enrolled.  Once you have received your monitor, please review the enclosed instructions.  Your monitor  has already been registered assigning a specific monitor serial # to you.  Billing and Patient Assistance Program Information  We have supplied [employer] with any of your insurance information on file for billing purposes. [employer] offers a sliding scale Patient Assistance Program for patients that do not have  insurance, or whose insurance does not completely cover the cost of the ZIO monitor.  You must apply for the Patient Assistance Program to qualify for this discounted rate.  To apply, please call [employer] at [phone], select option 4, select option 2, ask to apply for  Patient Assistance Program. Meredeth Ide will ask your household income, and how many people  are in your household. They will quote your out-of-pocket cost based on that information.  [employer] will also be able to set up a 84-month, interest-free payment plan if needed.  Applying the monitor   Shave hair from upper left chest.  Hold abrader disc by orange tab. Rub abrader in 40 strokes over the upper left chest as  indicated in your monitor instructions.  Clean area with 4 enclosed alcohol pads. Let dry.  Apply patch as indicated in monitor instructions. Patch will be placed under collarbone on left  side of chest with arrow pointing upward.  Rub patch adhesive wings for 2 minutes. Remove white label marked "1". Remove the white  label marked "2". Rub patch adhesive wings for 2 additional minutes.  While looking in a mirror, press and release button in center of patch. A small green light will  flash 3-4 times. This will be your only indicator that the monitor has been turned  on.  Do not shower for the first 24 hours. You may shower after the first 24 hours.  Press the button if you feel a symptom. You will hear a small click. Record Date, Time and  Symptom in the Patient Logbook.  When you are ready to remove the patch, follow instructions on the last 2 pages of Patient  Logbook. Stick patch monitor onto  the last page of Patient Logbook.  Place Patient Logbook in the blue and white box. Use locking tab on box and tape box closed  securely. The blue and white box has prepaid postage on it. Please place it in the mailbox as  soon as possible. Your physician should have your test results approximately 7 days after the  monitor has been mailed back to [facility].  Call [facility] Customer Care at [phone] if you have questions regarding  your ZIO XT patch monitor. Call them immediately if you see an orange light blinking on your  monitor.  If your monitor falls off in less than 4 days, contact our Monitor department at [phone].  If your monitor becomes loose or falls off after 4 days call [employer] at [phone] for  suggestions on securing your monitor   Follow-Up: At [facility], you and your health needs are our priority.  As part of our continuing mission to provide you with exceptional heart care, we have created designated Provider Care Teams.  These Care Teams include your primary Cardiologist (physician) and Advanced Practice Providers (APPs -  Physician Assistants and Nurse Practitioners) who all work together to provide you with the care you need, when you need it.  We recommend signing up for the patient portal called "MyChart".  Sign up information is provided on this After Visit Summary.  MyChart is used to connect with patients for Virtual Visits (Telemedicine).  Patients are able to view lab/test results, encounter notes, upcoming appointments, etc.  Non-urgent messages can be sent to your provider as well.   To learn more about what you can do with MyChart, go to [email or web address].    Your next appointment:   2-3 MONTHS  Provider:   Rise Paganini, DNP  Other Instructions:

## 2023-12-28 NOTE — Progress Notes (Addendum)
 PCP - Marrianne Mood, MD Cardiologist - LOV 12-28-23 Rise Paganini, NP  Thomasene Ripple, DO  PPM/ICD -  Device Orders -  Rep Notified -   Chest x-ray - 08-05-23 epic EKG - 12-28-23 epic Stress Test -  ECHO - &-18-24 epic TEE Cardiac Cath -   Sleep Study -  CPAP -   Fasting Blood Sugar -  Checks Blood Sugar _____ times a day  Blood Thinner Instructions:Warfarin hold 5 days w/ lovenox bridge Aspirin Instructions:ASA hold 5 days 81 mg  ERAS Protcol - PRE-SURGERY Ensure or G2-   COVID TEST-  COVID vaccine -  Activity-- Anesthesia review: AVR, CKDIII, Afib, CHF, HTN, PAC,PVC, OSA  Patient denies shortness of breath, fever, cough and chest pain at PAT appointment   All instructions explained to the patient, with a verbal understanding of the material. Patient agrees to go over the instructions while at home for a better understanding. Patient also instructed to self quarantine after being tested for COVID-19. The opportunity to ask questions was provided.

## 2023-12-28 NOTE — Progress Notes (Unsigned)
 Enrolled for [employer] to mail a ZIO XT long term holter monitor to the patients address on file.   Dr. Harriet Masson to read.

## 2023-12-28 NOTE — Progress Notes (Signed)
 Cardiology Office Note:    Date:  12/28/2023  ID:  Timothy Browning, DOB 1971/03/03, MRN [id number] PCP: Timothy Mood, MD  [facility] Providers Cardiologist:  Timothy Ripple, DO       Patient Profile:      Chief Complaint: 94-month follow-up and preoperative cardiac clearance History of Present Illness:  Timothy Browning is a 53 y.o. 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  ZIO monitor 11/17/2022 showed minimum heart rate of 54 bpm, max heart rate 178 bpm, average heart rate 86 bpm.  Symptoms are associated with rare PACs and rare PVCs.  He is s/p thoracic aortic aneurysm repair with Bentall procedure on 04/2023 utilizing a 29 mm aortic valve conduit (mechanical aortic valve).  He experienced postoperative atrial fibrillation was started on amiodarone.  He was admitted 07/04/2023 for gastrointestinal hemorrhage associated with intestinal diverticulosis.  He was seen by Dr. Servando Browning on 07/22/2023 with improvement hemoglobin since hospitalization for diverticular bleed.  He was stable in sinus rhythm on amiodarone.  Amiodarone was discontinued due to concern for potential long-term side effects with plan to monitor for A-fib recurrence with a 2-week monitor at 73-month mark post amiodarone.  ED visit 09/28/2023 for dizziness with position changes consistent with vertigo.  He was provided as needed meclizine.  Labs with improved hemoglobin at 13.7, creatinine 1.51, GFR 56 which was slightly decreased from prior.  Last seen in clinic on 10/04/2023 by Timothy Parody, NP.  Patient had presented with a chief complaint of dizziness for about 2 weeks.  He was continued on meclizine as needed.  He also  noted episodes of increased heart rate, potentially related to anxiety.  He was continued on metoprolol and instructed he could take an extra half tablet as needed for palpitations.  He is pending surgery for cystoureteroscopy with retrograde pyelogram and stent insertion on 01/04/2024 with Dr. Pete Browning.   Discussed the use of AI scribe software for clinical note transcription with the patient, who gave verbal consent to proceed.  Timothy Browning presents today for preoperative clearance and follow-up.  He reports he has been doing well overall however still dealing with ongoing symptoms of shortness of breath, palpitations, and occasional dizziness. These symptoms have been occurring for several months, with episodes of shortness of breath happening once to twice a week. The patient describes the shortness of breath as feeling like the air is "thick" and needing to take deep breaths. This can occur even when the patient is at rest, and is exacerbated by physical activity such as walking or carrying groceries. The palpitations are described as a loud thumping in the patient's ears, sometimes feeling irregular. These symptoms have been ongoing and have been identified at visits in the past but no clear cause has been identified.  The patient also mentions an upcoming procedure for a kidney stone, which has been causing pain. He has been using a CPAP machine for sleep apnea and is compliant with its use. He has been trying to increase his physical activity, including using an exercise bike and resistance bands.      Review of systems:  Please see the history of present illness. All other systems are reviewed and otherwise negative.     Home Medications:    Current Meds  Medication Sig  acetaminophen (TYLENOL) 500 MG tablet Take 1,000 mg by mouth every 6 (six) hours as needed for moderate pain (pain score 4-6).   aspirin EC 81 MG tablet Take 1 tablet (81 mg total) by mouth daily. Swallow whole.    cyclobenzaprine (FLEXERIL) 5 MG tablet Take 5 mg by mouth 3 (three) times daily as needed for muscle spasms.   docusate sodium (COLACE) 100 MG capsule Take 100 mg by mouth every 3 (three) days.   enoxaparin (LOVENOX) 150 MG/ML injection Inject 1 mL (150 mg total) into the skin daily.   meclizine (ANTIVERT) 25 MG tablet Take 1 tablet (25 mg total) by mouth 3 (three) times daily as needed for dizziness.   metoprolol succinate (TOPROL-XL) 25 MG 24 hr tablet Take 0.5 tablets (12.5 mg total) by mouth daily with breakfast. (Patient taking differently: Take 25 mg by mouth daily with breakfast.)   oxycodone (OXY-IR) 5 MG capsule Take 5 mg by mouth every 4 (four) hours as needed for pain.   rosuvastatin (CRESTOR) 5 MG tablet Take 1 tablet (5 mg total) by mouth daily.   sildenafil (VIAGRA) 100 MG tablet Take 100 mg by mouth daily as needed for erectile dysfunction.   tamsulosin (FLOMAX) 0.4 MG CAPS capsule Take 1 capsule (0.4 mg total) by mouth daily.   warfarin (COUMADIN) 2.5 MG tablet 3 tabs by mouth daily or as directed by anticoagulation clinic based on INR.   Studies Reviewed:   EKG Interpretation Date/Time:  Tuesday December 28 2023 10:16:31 EDT Ventricular Rate:  88 PR Interval:  174 QRS Duration:  94 QT Interval:  362 QTC Calculation: 438 R Axis:   52  Text Interpretation: Normal sinus rhythm Normal ECG When compared with ECG of 28-Sep-2023 15:04, PREVIOUS ECG IS PRESENT Confirmed by Rise Paganini [phone]) on 12/28/2023 1:11:52 PM    Echocardiogram 05/04/2023 1. Left ventricular ejection fraction, by estimation, is 55 to 60%. The  left ventricle has normal function. The left ventricle has no regional  wall motion abnormalities. There is mild left ventricular hypertrophy of  the basal-septal segment. Left  ventricular diastolic parameters were normal.   2. Right ventricular systolic function is normal. The right ventricular  size is normal. Tricuspid regurgitation signal is inadequate for  assessing  PA pressure.   3. The mitral valve is normal in structure. No evidence of mitral valve  regurgitation.   4. The aortic valve is tricuspid. There is mild calcification of the  aortic valve. There is mild thickening of the aortic valve. Aortic valve  regurgitation is trivial. Aortic valve sclerosis/calcification is present,  without any evidence of aortic  stenosis.   5. Aortic dilatation noted. 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 There is moderate dilatation of the ascending  aorta, measuring 45 mm.   6. The inferior vena cava is normal in size with greater than 50%  respiratory variability, suggesting right atrial pressure of 3 mmHg.   ZIO 11/17/2022 Patch Wear Time:  9 days and 3 hours (2024-02-05T16:10:17-0500 to 2024-02-14T19:43:19-0500)   Patient had a min HR of 54 bpm, max HR of 178 bpm, and avg HR of 86 bpm. Predominant underlying rhythm was Sinus Rhythm. Isolated SVEs were rare (<1.0%), SVE Couplets were rare (<1.0%), and SVE Triplets were rare (<1.0%). Isolated VEs were rare (<1.0%),  VE Couplets were rare (<1.0%), and no VE Triplets were present.    Symptoms associated with rare premature atrial complexes and rare premature ventricular complexes.   Conclusion: Rare  Symptomatic premature atrial complexes and Rare symptomatic premature ventricular complexes.  Cardiac catheterization 07/15/2022 1.  Normal coronary arteries. 2.  Left ventricular angiography was not performed.  EF was mildly reduced by echo. 3.  Mildly elevated left ventricular end-diastolic pressure at 17 mmHg. 4.  Difficult catheterization via the right radial artery due to significant tortuosity of the innominate artery as well as ascending aortic aneurysm. Risk Assessment/Calculations:             Physical Exam:   VS:  BP 120/78   Pulse 88   Ht 6\' 1"  (1.854 m)   Wt 241 lb (109.3 kg)   SpO2 96%   BMI 31.80 kg/m    Wt Readings from Last 3 Encounters:  12/28/23 241  lb (109.3 kg)  12/14/23 234 lb (106.1 kg)  12/03/23 234 lb (106.1 kg)    GEN: Well nourished, well developed in no acute distress NECK: No JVD; No carotid bruits CARDIAC: RRR, no murmurs, rubs, gallops.  Mechanical valve click noted RESPIRATORY:  Clear to auscultation without rales, wheezing or rhonchi  ABDOMEN: Soft, non-tender, non-distended EXTREMITIES:  No edema; No acute deformity     Assessment and Plan:  Palpitations / Postoperative atrial fibrillation He continues to note episodes of palpitations and irregular heart rates indicating possible reoccurrence of A-fib. The palpitations are described as a loud thumping in the patient's ears.  This has been ongoing for several months and occurs 1-2 times weekly with short durations.  -EKG today showed normal sinus rhythm without acute changes -Will increase his metoprolol from 12.5 mg daily to 25 mg daily due to increased frequency of his palpitations -On OAC due to mechanical valve. Has been off Amiodarone x6 months at Dr. Mallory Shirk direction, plan for monitor when he has been off Amiodarone 6 mos to reassess for PAF.  -14-day ZIO monitor ordered today -Continue Coumadin and follow with Coumadin clinic -Increase metoprolol-XL to 25 mg daily  Dyspnea He notes ongoing intermittent dyspnea with unclear etiology.  The symptoms have been occurring for several months with episodes of shortness of breath happening once a week.  The patient describes shortness of breath as the air is "thick" and needing to take deep breaths.  SOB can be exacerbated by things like exertion.  The episodes are usually quick and self-limiting. Most recent echocardiogram 04/2023 shows LVEF 55 to 60%, no RWMA, mild LVH, RV function size normal Previous echo 06/2022 with LVEF 45-50% LHC 06/2022 with normal coronaries -Today he is euvolemic and well compensated on exam. -EKG today showed normal sinus rhythm without acute changes -?  Exercise tolerance.  He is slowly  beginning to resume physical activity at home -Repeat echocardiogram ordered today to assess for LV/RV dysfunction  S/p Bental Procedure and Mechanical Aortic valve on 04/2023 No current symptoms related to surgery. -Continue metoprolol XL 25 mg daily   Hypertension BP today 120/78 and well-controlled at home. -Continue current antihypertensive regimen.  OSA Remains adherent to CPAP  Preoperative cardiovascular exam According to the Revised Cardiac Risk Index (RCRI), his Perioperative Risk of Major Cardiac Event is (%): 0.4. His Functional Capacity in METs is: 7.93 according to the Duke Activity Status Index (DASI). Therefore, based on ACC/AHA guidelines, patient would be at acceptable risk for the planned procedure without further cardiovascular testing. I will route this recommendation to the requesting party via Epic fax function.   Per office protocol, patient can hold warfarin for 5 days prior to procedure.     Patient WILL need  bridging with Lovenox (enoxaparin) around procedure.   Will send to coumadin clinic as he will need a bridge. Patient has hx of diverticular bleeding in 2024 but was bridged without issue in Jan 2025            Dispo:  Return in about 3 months (around 03/29/2024).  Signed, Denyce Robert, NP

## 2023-12-29 ENCOUNTER — Encounter (HOSPITAL_COMMUNITY): Payer: Self-pay

## 2023-12-29 ENCOUNTER — Other Ambulatory Visit: Payer: Self-pay

## 2023-12-29 ENCOUNTER — Encounter (HOSPITAL_COMMUNITY)
Admission: RE | Admit: 2023-12-29 | Discharge: 2023-12-29 | Disposition: A | Discharge status: Home / Self Care | Payer: Medicaid Other | Source: Ambulatory Visit | Attending: Urology | Admitting: Urology

## 2023-12-29 VITALS — BP 128/85 | HR 84 | Temp 98.4°F | Resp 16 | Ht 73.0 in | Wt 237.0 lb

## 2023-12-29 DIAGNOSIS — Z01812 Encounter for preprocedural laboratory examination: Secondary | ICD-10-CM | POA: Insufficient documentation

## 2023-12-29 DIAGNOSIS — N189 Chronic kidney disease, unspecified: Secondary | ICD-10-CM | POA: Insufficient documentation

## 2023-12-29 DIAGNOSIS — I509 Heart failure, unspecified: Secondary | ICD-10-CM | POA: Diagnosis not present

## 2023-12-29 DIAGNOSIS — I10 Essential (primary) hypertension: Secondary | ICD-10-CM

## 2023-12-29 DIAGNOSIS — G4730 Sleep apnea, unspecified: Secondary | ICD-10-CM | POA: Diagnosis not present

## 2023-12-29 DIAGNOSIS — I130 Hypertensive heart and chronic kidney disease with heart failure and stage 1 through stage 4 chronic kidney disease, or unspecified chronic kidney disease: Secondary | ICD-10-CM | POA: Diagnosis not present

## 2023-12-29 DIAGNOSIS — Z5181 Encounter for therapeutic drug level monitoring: Secondary | ICD-10-CM | POA: Insufficient documentation

## 2023-12-29 DIAGNOSIS — I4891 Unspecified atrial fibrillation: Secondary | ICD-10-CM | POA: Diagnosis not present

## 2023-12-29 DIAGNOSIS — I97190 Other postprocedural cardiac functional disturbances following cardiac surgery: Secondary | ICD-10-CM | POA: Diagnosis not present

## 2023-12-29 DIAGNOSIS — Z7901 Long term (current) use of anticoagulants: Secondary | ICD-10-CM | POA: Diagnosis not present

## 2023-12-29 DIAGNOSIS — N201 Calculus of ureter: Secondary | ICD-10-CM | POA: Diagnosis not present

## 2023-12-29 DIAGNOSIS — Z79899 Other long term (current) drug therapy: Secondary | ICD-10-CM | POA: Insufficient documentation

## 2023-12-29 DIAGNOSIS — Z01818 Encounter for other preprocedural examination: Secondary | ICD-10-CM | POA: Diagnosis present

## 2023-12-29 HISTORY — DX: Unspecified osteoarthritis, unspecified site: M19.90

## 2023-12-29 HISTORY — DX: Diverticulitis of intestine, part unspecified, without perforation or abscess without bleeding: K57.92

## 2023-12-29 HISTORY — DX: Presence of prosthetic heart valve: Z95.2

## 2023-12-29 HISTORY — DX: Anxiety disorder, unspecified: F41.9

## 2023-12-29 HISTORY — DX: Depression, unspecified: F32.A

## 2023-12-29 HISTORY — DX: Cardiac arrhythmia, unspecified: I49.9

## 2023-12-29 HISTORY — DX: Personal history of urinary calculi: Z87.442

## 2023-12-29 HISTORY — DX: Heart failure, unspecified: I50.9

## 2023-12-29 LAB — BASIC METABOLIC PANEL
Anion gap: 6 (ref 5–15)
BUN: 21 mg/dL — ABNORMAL HIGH (ref 6–20)
CO2: 27 mmol/L (ref 22–32)
Calcium: 9.3 mg/dL (ref 8.9–10.3)
Chloride: 105 mmol/L (ref 98–111)
Creatinine, Ser: 1.39 mg/dL — ABNORMAL HIGH (ref 0.61–1.24)
GFR, Estimated: >60 mL/min (ref >60)
Glucose, Bld: 100 mg/dL — ABNORMAL HIGH (ref 70–99)
Potassium: 4.1 mmol/L (ref 3.5–5.1)
Sodium: 138 mmol/L (ref 135–145)

## 2023-12-29 LAB — CBC
HCT: 46.3 % (ref 39.0–52.0)
Hemoglobin: 14.5 g/dL (ref 13.0–17.0)
MCH: 28.9 pg (ref 26.0–34.0)
MCHC: 31.3 g/dL (ref 30.0–36.0)
MCV: 92.2 fL (ref 80.0–100.0)
Platelets: 325 10*3/uL (ref 150–400)
RBC: 5.02 MIL/uL (ref 4.22–5.81)
RDW: 16.3 % — ABNORMAL HIGH (ref 11.5–15.5)
WBC: 4.5 10*3/uL (ref 4.0–10.5)
nRBC: 0 % (ref 0.0–0.2)

## 2023-12-29 LAB — PROTIME-INR
INR: 1.8 — ABNORMAL HIGH (ref 0.8–1.2)
Prothrombin Time: 21.4 s — ABNORMAL HIGH (ref 11.4–15.2)

## 2023-12-30 NOTE — Anesthesia Preprocedure Evaluation (Addendum)
 Anesthesia Evaluation  Patient identified by MRN, date of birth, ID band Patient awake    Reviewed: Allergy & Precautions, NPO status , Patient's Chart, lab work & pertinent test results, reviewed documented beta blocker date and time   Airway Mallampati: II  TM Distance: >3 FB     Dental no notable dental hx. (+) Teeth Intact, Dental Advisory Given   Pulmonary sleep apnea and Continuous Positive Airway Pressure Ventilation    Pulmonary exam normal breath sounds clear to auscultation       Cardiovascular hypertension, Pt. on medications and Pt. on home beta blockers +CHF  Normal cardiovascular exam+ dysrhythmias Atrial Fibrillation  Rhythm:Regular Rate:Normal  EKG 01/02/24 NSR, Normal  Echo 05/04/23 1. Left ventricular ejection fraction, by estimation, is 55 to 60%. The  left ventricle has normal function. The left ventricle has no regional  wall motion abnormalities. There is mild left ventricular hypertrophy of  the basal-septal segment. Left  ventricular diastolic parameters were normal.   2. Right ventricular systolic function is normal. The right ventricular  size is normal. Tricuspid regurgitation signal is inadequate for assessing  PA pressure.   3. The mitral valve is normal in structure. No evidence of mitral valve  regurgitation.   4. The aortic valve is tricuspid. There is mild calcification of the  aortic valve. There is mild thickening of the aortic valve. Aortic valve  regurgitation is trivial. Aortic valve sclerosis/calcification is present,  without any evidence of aortic  stenosis.   5. Aortic dilatation noted. 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 There is moderate dilatation of the ascending  aorta, measuring 45 mm.   6. The inferior vena cava is normal in size with greater than 50%  respiratory variability, suggesting right atrial pressure of 3 mmHg.   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  Normal coronary arteries. 2.  Left ventricular angiography was not performed.  EF was mildly reduced by echo. 3.  Mildly elevated left ventricular end-diastolic pressure at 17 mmHg. 4.  Difficult catheterization via the right radial artery due to significant tortuosity of the innominate artery as well as ascending aortic aneurysm.       Neuro/Psych  PSYCHIATRIC DISORDERS Anxiety Depression    negative neurological ROS     GI/Hepatic PUD,GERD  Medicated,,  Endo/Other  Hyperlipidemia  Renal/GU Renal InsufficiencyRenal diseaseLeft Ureteral calculus   negative genitourinary   Musculoskeletal  (+) Arthritis , Osteoarthritis,    Abdominal  (+) + obese  Peds  Hematology  (+) Blood dyscrasia, anemia Coumadin Therapy- last dose 3/12 INR 1.1 PT 14.8 on 3/16 Lovenox Bridge started on 3/14- last dose yesterday am   Anesthesia Other Findings   Reproductive/Obstetrics                              Anesthesia Physical Anesthesia Plan  ASA: 3  Anesthesia Plan: MAC and General   Post-op Pain Management: Dilaudid IV, Precedex and Ofirmev IV (intra-op)*   Induction: Intravenous  PONV Risk Score and Plan: 4 or greater and Treatment may vary due to age or medical condition, Midazolam, Ondansetron and Dexamethasone  Airway Management Planned: LMA  Additional Equipment: None  Intra-op Plan:   Post-operative Plan: Extubation in OR  Informed Consent: I have reviewed the patients History and Physical, chart, labs and discussed the procedure including the risks, benefits and alternatives for the proposed anesthesia  with the patient or authorized representative who has indicated his/her understanding and acceptance.     Dental advisory given  Plan Discussed with: CRNA and Anesthesiologist  Anesthesia Plan Comments: (See PAT note 12/29/2023)        Anesthesia Quick Evaluation
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
## 2023-12-31 ENCOUNTER — Telehealth: Payer: Self-pay | Admitting: Urology

## 2023-12-31 NOTE — Telephone Encounter (Signed)
 Timothy Browning

## 2024-01-02 ENCOUNTER — Emergency Department (HOSPITAL_COMMUNITY)

## 2024-01-02 ENCOUNTER — Emergency Department (HOSPITAL_COMMUNITY): Admission: EM | Admit: 2024-01-02 | Discharge: 2024-01-02 | Disposition: A | Discharge status: Home / Self Care

## 2024-01-02 DIAGNOSIS — Z7982 Long term (current) use of aspirin: Secondary | ICD-10-CM | POA: Insufficient documentation

## 2024-01-02 DIAGNOSIS — R0789 Other chest pain: Secondary | ICD-10-CM | POA: Diagnosis not present

## 2024-01-02 DIAGNOSIS — Z7901 Long term (current) use of anticoagulants: Secondary | ICD-10-CM | POA: Insufficient documentation

## 2024-01-02 DIAGNOSIS — I509 Heart failure, unspecified: Secondary | ICD-10-CM | POA: Insufficient documentation

## 2024-01-02 DIAGNOSIS — N189 Chronic kidney disease, unspecified: Secondary | ICD-10-CM | POA: Diagnosis not present

## 2024-01-02 DIAGNOSIS — R079 Chest pain, unspecified: Secondary | ICD-10-CM | POA: Insufficient documentation

## 2024-01-02 DIAGNOSIS — I130 Hypertensive heart and chronic kidney disease with heart failure and stage 1 through stage 4 chronic kidney disease, or unspecified chronic kidney disease: Secondary | ICD-10-CM | POA: Insufficient documentation

## 2024-01-02 DIAGNOSIS — J9811 Atelectasis: Secondary | ICD-10-CM | POA: Diagnosis not present

## 2024-01-02 LAB — TROPONIN I (HIGH SENSITIVITY)
Troponin I (High Sensitivity): 8 ng/L (ref <18)
Troponin I (High Sensitivity): 7 ng/L (ref <18)

## 2024-01-02 LAB — CBC WITH DIFFERENTIAL/PLATELET
Abs Immature Granulocytes: 0.02 10*3/uL (ref 0.00–0.07)
Basophils Absolute: 0 10*3/uL (ref 0.0–0.1)
Basophils Relative: 1 %
Eosinophils Absolute: 0.1 10*3/uL (ref 0.0–0.5)
Eosinophils Relative: 2 %
HCT: 45.2 % (ref 39.0–52.0)
Hemoglobin: 14.2 g/dL (ref 13.0–17.0)
Immature Granulocytes: 0 %
Lymphocytes Relative: 31 %
Lymphs Abs: 2.1 10*3/uL (ref 0.7–4.0)
MCH: 29 pg (ref 26.0–34.0)
MCHC: 31.4 g/dL (ref 30.0–36.0)
MCV: 92.4 fL (ref 80.0–100.0)
Monocytes Absolute: 0.8 10*3/uL (ref 0.1–1.0)
Monocytes Relative: 12 %
Neutro Abs: 3.8 10*3/uL (ref 1.7–7.7)
Neutrophils Relative %: 54 %
Platelets: 300 10*3/uL (ref 150–400)
RBC: 4.89 MIL/uL (ref 4.22–5.81)
RDW: 16 % — ABNORMAL HIGH (ref 11.5–15.5)
WBC: 6.8 10*3/uL (ref 4.0–10.5)
nRBC: 0 % (ref 0.0–0.2)

## 2024-01-02 LAB — PROTIME-INR
INR: 1.1 (ref 0.8–1.2)
Prothrombin Time: 14.8 s (ref 11.4–15.2)

## 2024-01-02 LAB — BASIC METABOLIC PANEL
Anion gap: 7 (ref 5–15)
BUN: 21 mg/dL — ABNORMAL HIGH (ref 6–20)
CO2: 26 mmol/L (ref 22–32)
Calcium: 9.3 mg/dL (ref 8.9–10.3)
Chloride: 104 mmol/L (ref 98–111)
Creatinine, Ser: 1.51 mg/dL — ABNORMAL HIGH (ref 0.61–1.24)
GFR, Estimated: 56 mL/min — ABNORMAL LOW (ref >60)
Glucose, Bld: 104 mg/dL — ABNORMAL HIGH (ref 70–99)
Potassium: 3.6 mmol/L (ref 3.5–5.1)
Sodium: 137 mmol/L (ref 135–145)

## 2024-01-02 NOTE — ED Triage Notes (Signed)
 Intermittently left sided chest pain x 3 days, worse today because pain has increased and constant. Pt with hx of open heart surgery 7/24. Pt hasn't took bp meds this morning yet. Pt scheduled for kidney stone removal Tuesday.

## 2024-01-02 NOTE — Discharge Instructions (Signed)
 Your evaluation in the emergency department today was reassuring.  We recommend close follow-up with your cardiologist.  You would benefit from having an echocardiogram and/or a stress test completed within the next 30 days to further evaluate your chest pain.  Continue your daily prescribed medications including your Lovenox bridge pending your upcoming urologic procedure.  You may return to the ED for new or concerning symptoms.

## 2024-01-02 NOTE — ED Provider Notes (Signed)
 [place] EMERGENCY DEPARTMENT AT [facility] Provider Note   CSN: [id number] Arrival date & time: 01/02/24  0012     History  Chief Complaint  Patient presents with   Chest Pain    MARSH HECKLER is a 53 y.o. male.  53 y/o male with hx of aortic root aneurysm (s/p repair), AVR, PAF (on Coumadin), HTN, CHF (LVEF 55-60%), CKD, OSA on CPAP, depression/anxiety presents to the emergency department for evaluation of chest pain.  He has been experiencing intermittent left-sided and central chest pain for the past 3 days which has been more frequent and occurrence today.  He denies any radiation of his pain.  Has noted some aggravation/worsening with activity, though also reports any degree of movement such as bending over could also elicit symptoms.  He has some shortness of breath sporadically at baseline.  Has not noted any specific worsening of this.  He has some subjective swelling in his right foot this morning without other lower extremity edema.  No lightheadedness, diaphoresis, fever, cough, hemoptysis, increased bleeding/bruising, syncope.    He is presently on a Coumadin bridge to Lovenox pending cystoureteroscopy on 01/04/24.  Discontinued Coumadin use on 12/29/23; started Lovenox QD on 12/31/23 and has remained compliant.  The history is provided by the patient. No language interpreter was used.  Chest Pain      Home Medications Prior to Admission medications   Medication Sig Start Date End Date Taking? Authorizing Provider  acetaminophen (TYLENOL) 500 MG tablet Take 1,000 mg by mouth every 6 (six) hours as needed for moderate pain (pain score 4-6).   Yes [provider]  cyclobenzaprine (FLEXERIL) 5 MG tablet Take 5 mg by mouth 3 (three) times daily as needed for muscle spasms.   Yes [provider]  docusate sodium (COLACE) 100 MG capsule Take 100 mg by mouth every 3 (three) days.   Yes [provider]  enoxaparin (LOVENOX) 150 MG/ML  injection Inject 1 mL (150 mg total) into the skin daily. 12/21/23  Yes Tobb, Kardie, DO  meclizine (ANTIVERT) 25 MG tablet Take 1 tablet (25 mg total) by mouth 3 (three) times daily as needed for dizziness. 09/28/23  Yes Cheron Schaumann K, PA-C  metoprolol succinate (TOPROL-XL) 25 MG 24 hr tablet Take 0.5 tablets (12.5 mg total) by mouth daily with breakfast. Patient taking differently: Take 25 mg by mouth daily with breakfast. 07/16/23 01/12/24 Yes Marrianne Mood, MD  oxycodone (OXY-IR) 5 MG capsule Take 5 mg by mouth every 4 (four) hours as needed for pain.   Yes [provider]  rosuvastatin (CRESTOR) 5 MG tablet Take 1 tablet (5 mg total) by mouth daily. 11/01/23  Yes Marrianne Mood, MD  sildenafil (VIAGRA) 100 MG tablet Take 100 mg by mouth daily as needed for erectile dysfunction.   Yes [provider]  tamsulosin (FLOMAX) 0.4 MG CAPS capsule Take 1 capsule (0.4 mg total) by mouth daily. 12/03/23  Yes Stoneking, Danford Bad., MD  aspirin EC 81 MG tablet Take 1 tablet (81 mg total) by mouth daily. Swallow whole. 05/14/23   Leary Roca, PA-C  warfarin (COUMADIN) 2.5 MG tablet 3 tabs by mouth daily or as directed by anticoagulation clinic based on INR. 12/25/23   Creig Hines, NP      Allergies    Ibuprofen    Review of Systems   Review of Systems  Cardiovascular:  Positive for chest pain.  Ten systems reviewed and are negative for acute change, except  as noted in the HPI.    Physical Exam Updated Vital Signs BP 121/78   Pulse 67   Temp 97.7 F (36.5 C) (Oral)   Resp 18   SpO2 100%  Physical Exam Vitals and nursing note reviewed.  Constitutional:      General: He is not in acute distress.    Appearance: He is well-developed. He is not diaphoretic.     Comments: Nontoxic-appearing and in no acute distress  HENT:     Head: Normocephalic and atraumatic.  Eyes:     General: No scleral icterus.    Conjunctiva/sclera: Conjunctivae normal.   Cardiovascular:     Rate and Rhythm: Normal rate and regular rhythm.     Pulses: Normal pulses.  Pulmonary:     Effort: Pulmonary effort is normal. No respiratory distress.     Breath sounds: No stridor. No wheezing.     Comments: Respirations even and unlabored.  Lungs clear to auscultation. Musculoskeletal:        General: Normal range of motion.     Cervical back: Normal range of motion.     Comments: No pitting edema noted in bilateral lower extremities  Skin:    General: Skin is warm and dry.     Coloration: Skin is not pale.     Findings: No erythema or rash.  Neurological:     Mental Status: He is alert and oriented to person, place, and time.     Coordination: Coordination normal.  Psychiatric:        Behavior: Behavior normal.     ED Results / Procedures / Treatments   Labs (all labs ordered are listed, but only abnormal results are displayed) Labs Reviewed  CBC WITH DIFFERENTIAL/PLATELET - Abnormal; Notable for the following components:      Result Value   RDW 16.0 (*)    All other components within normal limits  BASIC METABOLIC PANEL - Abnormal; Notable for the following components:   Glucose, Bld 104 (*)    BUN 21 (*)    Creatinine, Ser 1.51 (*)    GFR, Estimated 56 (*)    All other components within normal limits  PROTIME-INR  TROPONIN I (HIGH SENSITIVITY)  TROPONIN I (HIGH SENSITIVITY)    EKG None  Radiology DG Chest 2 View Result Date: 01/02/2024 CLINICAL DATA:  Chest pain intermittently on the left side for 3 days EXAM: CHEST - 2 VIEW COMPARISON:  08/05/2023 FINDINGS: Stable cardiomediastinal silhouette. Sternotomy. AVR. Left basilar atelectasis. The lungs are otherwise clear. No pleural effusion or pneumothorax. No displaced rib fractures. IMPRESSION: No active cardiopulmonary disease. Electronically Signed   By: Minerva Fester M.D.   On: 01/02/2024 00:50    Procedures Procedures    Medications Ordered in ED Medications - No data to  display  ED Course/ Medical Decision Making/ A&P Clinical Course as of 01/02/24 0519  Sun Jan 02, 2024  0415 Stable delta troponin level. BP improving without intervention. [KH]    Clinical Course User Index [KH] Antony Madura, PA-C                                 Medical Decision Making Amount and/or Complexity of Data Reviewed Labs: ordered. Radiology: ordered.   This patient presents to the ED for concern of chest pain, this involves an extensive number of treatment options, and is a complaint that carries with it a high risk of complications and morbidity.  The differential diagnosis includes ACS vs MSK vs anxiety vs PTX vs pleural effusion vs PNA   Co morbidities that complicate the patient evaluation  Aortic root aneurysm s/p repair AVR PAF (on Coumadin) HTN CKD   Additional history obtained:  External records from outside source obtained and reviewed including outpatient cardiology visit on 12/28/23 as well as TEE from 05/05/13.   Lab Tests:  I Ordered, and personally interpreted labs.  The pertinent results include:  BUN 21/Creatinine 1.51 (stable), Troponin 8 > 7.    Imaging Studies ordered:  I ordered imaging studies including CXR  I independently visualized and interpreted imaging which showed no acute cardiopulmonary abnormality.  Stable mediastinal silhouette. I agree with the radiologist interpretation   Cardiac Monitoring:  The patient was maintained on a cardiac monitor.  I personally viewed and interpreted the cardiac monitored which showed an underlying rhythm of: NSR   Medicines ordered and prescription drug management:  I have reviewed the patients home medicines and have made adjustments as needed   Test Considered:  UDS   Problem List / ED Course:  Patient presents to the emergency department for evaluation of chest pain.  Work up today is reassuring. EKG is nonischemic and troponin negative x2.   Chest x-ray without evidence of  mediastinal widening to suggest dissection.  No pneumothorax, pneumonia, pleural effusion.   Pulmonary embolus further considered; however, patient without tachycardia, tachypnea, dyspnea, hypoxia.  He is compliant with his anticoagulant. Question anxiety component vs MSK. Reports pain worse with movement which includes forward bending; less consistent with true exertional chest pain. Would benefit from echocardiogram, as planned, given hx though feel it reasonable for this to be completed as an outpatient.   Reevaluation:  After the interventions noted above, I reevaluated the patient and found that they have : remained stable   Social Determinants of Health:  Lives independently   Dispostion:  After consideration of the diagnostic results and the patients response to treatment, I feel that the patent would benefit from outpatient f/u with cardiology. Stressed adherence to current medication regimen. Return precautions discussed and provided. Patient discharged in stable condition with no unaddressed concerns.          Final Clinical Impression(s) / ED Diagnoses Final diagnoses:  Nonspecific chest pain    Rx / DC Orders ED Discharge Orders     None         Antony Madura, PA-C 01/02/24 0534    Coral Spikes, DO 01/02/24 [id number]

## 2024-01-04 ENCOUNTER — Encounter: Payer: Self-pay | Admitting: Urology

## 2024-01-04 ENCOUNTER — Ambulatory Visit (HOSPITAL_COMMUNITY)

## 2024-01-04 ENCOUNTER — Ambulatory Visit (HOSPITAL_COMMUNITY)
Admission: RE | Admit: 2024-01-04 | Discharge: 2024-01-04 | Disposition: A | Discharge status: Home / Self Care | Payer: Medicaid Other | Source: Ambulatory Visit | Attending: Urology | Admitting: Urology

## 2024-01-04 ENCOUNTER — Encounter (HOSPITAL_COMMUNITY)
Admission: RE | Disposition: A | Discharge status: Home / Self Care | Payer: Self-pay | Source: Ambulatory Visit | Attending: Urology

## 2024-01-04 ENCOUNTER — Encounter (HOSPITAL_COMMUNITY): Payer: Self-pay | Admitting: Urology

## 2024-01-04 ENCOUNTER — Ambulatory Visit (HOSPITAL_COMMUNITY): Payer: Self-pay | Admitting: Physician Assistant

## 2024-01-04 ENCOUNTER — Other Ambulatory Visit: Payer: Self-pay | Admitting: Student

## 2024-01-04 ENCOUNTER — Ambulatory Visit (HOSPITAL_BASED_OUTPATIENT_CLINIC_OR_DEPARTMENT_OTHER): Admitting: Certified Registered Nurse Anesthetist

## 2024-01-04 ENCOUNTER — Other Ambulatory Visit (HOSPITAL_COMMUNITY): Payer: Self-pay

## 2024-01-04 DIAGNOSIS — G4730 Sleep apnea, unspecified: Secondary | ICD-10-CM | POA: Insufficient documentation

## 2024-01-04 DIAGNOSIS — I130 Hypertensive heart and chronic kidney disease with heart failure and stage 1 through stage 4 chronic kidney disease, or unspecified chronic kidney disease: Secondary | ICD-10-CM | POA: Insufficient documentation

## 2024-01-04 DIAGNOSIS — M1990 Unspecified osteoarthritis, unspecified site: Secondary | ICD-10-CM | POA: Insufficient documentation

## 2024-01-04 DIAGNOSIS — I509 Heart failure, unspecified: Secondary | ICD-10-CM | POA: Insufficient documentation

## 2024-01-04 DIAGNOSIS — R310 Gross hematuria: Secondary | ICD-10-CM | POA: Insufficient documentation

## 2024-01-04 DIAGNOSIS — I358 Other nonrheumatic aortic valve disorders: Secondary | ICD-10-CM | POA: Diagnosis not present

## 2024-01-04 DIAGNOSIS — N189 Chronic kidney disease, unspecified: Secondary | ICD-10-CM | POA: Insufficient documentation

## 2024-01-04 DIAGNOSIS — I4891 Unspecified atrial fibrillation: Secondary | ICD-10-CM

## 2024-01-04 DIAGNOSIS — I7121 Aneurysm of the ascending aorta, without rupture: Secondary | ICD-10-CM | POA: Diagnosis not present

## 2024-01-04 DIAGNOSIS — F32A Depression, unspecified: Secondary | ICD-10-CM | POA: Insufficient documentation

## 2024-01-04 DIAGNOSIS — E669 Obesity, unspecified: Secondary | ICD-10-CM | POA: Insufficient documentation

## 2024-01-04 DIAGNOSIS — I878 Other specified disorders of veins: Secondary | ICD-10-CM | POA: Diagnosis not present

## 2024-01-04 DIAGNOSIS — N201 Calculus of ureter: Secondary | ICD-10-CM

## 2024-01-04 DIAGNOSIS — E7800 Pure hypercholesterolemia, unspecified: Secondary | ICD-10-CM | POA: Diagnosis not present

## 2024-01-04 DIAGNOSIS — N183 Chronic kidney disease, stage 3 unspecified: Secondary | ICD-10-CM | POA: Diagnosis not present

## 2024-01-04 DIAGNOSIS — I5022 Chronic systolic (congestive) heart failure: Secondary | ICD-10-CM | POA: Diagnosis not present

## 2024-01-04 DIAGNOSIS — Z7901 Long term (current) use of anticoagulants: Secondary | ICD-10-CM | POA: Diagnosis not present

## 2024-01-04 DIAGNOSIS — Z6831 Body mass index (BMI) 31.0-31.9, adult: Secondary | ICD-10-CM | POA: Diagnosis not present

## 2024-01-04 DIAGNOSIS — F419 Anxiety disorder, unspecified: Secondary | ICD-10-CM | POA: Diagnosis not present

## 2024-01-04 DIAGNOSIS — Z8614 Personal history of Methicillin resistant Staphylococcus aureus infection: Secondary | ICD-10-CM | POA: Diagnosis not present

## 2024-01-04 DIAGNOSIS — I110 Hypertensive heart disease with heart failure: Secondary | ICD-10-CM

## 2024-01-04 HISTORY — PX: CYSTOSCOPY WITH RETROGRADE PYELOGRAM, URETEROSCOPY AND STENT PLACEMENT: SHX5789

## 2024-01-04 LAB — APTT: aPTT: 29 s (ref 24–36)

## 2024-01-04 SURGERY — CYSTOURETEROSCOPY, WITH RETROGRADE PYELOGRAM AND STENT INSERTION
Anesthesia: General | Laterality: Left

## 2024-01-04 MED ORDER — IOHEXOL 300 MG/ML  SOLN
INTRAMUSCULAR | Status: DC | PRN
  Administered 2024-01-04: 15 mL

## 2024-01-04 MED ORDER — LACTATED RINGERS IV SOLN: INTRAVENOUS | Status: DC

## 2024-01-04 MED ORDER — ORAL CARE MOUTH RINSE: 15.0000 mL | Freq: Once | OROMUCOSAL | Status: AC

## 2024-01-04 MED ORDER — ONDANSETRON HCL 4 MG/2ML IJ SOLN
INTRAMUSCULAR | Status: DC | PRN
Start: 2024-01-04 — End: 2024-01-04
  Administered 2024-01-04: 4 mg via INTRAVENOUS

## 2024-01-04 MED ORDER — PHENAZOPYRIDINE HCL 200 MG PO TABS
200.0000 mg | ORAL_TABLET | Freq: Three times a day (TID) | ORAL | 1 refills | Status: AC | PRN
  Filled 2024-01-04: qty 15, 5d supply, fill #0

## 2024-01-04 MED ORDER — LIDOCAINE HCL URETHRAL/MUCOSAL 2 % EX GEL
CUTANEOUS | Status: AC
  Filled 2024-01-04: qty 5

## 2024-01-04 MED ORDER — PROPOFOL 10 MG/ML IV BOLUS
INTRAVENOUS | Status: AC
  Filled 2024-01-04: qty 20

## 2024-01-04 MED ORDER — CEFDINIR 300 MG PO CAPS
300.0000 mg | ORAL_CAPSULE | Freq: Two times a day (BID) | ORAL | 0 refills | Status: AC
  Filled 2024-01-04: qty 10, 5d supply, fill #0

## 2024-01-04 MED ORDER — FENTANYL CITRATE (PF) 100 MCG/2ML IJ SOLN
INTRAMUSCULAR | Status: AC
Start: 2024-01-04 — End: ?
  Filled 2024-01-04: qty 2

## 2024-01-04 MED ORDER — DEXAMETHASONE SODIUM PHOSPHATE 10 MG/ML IJ SOLN
INTRAMUSCULAR | Status: DC | PRN
  Administered 2024-01-04: 10 mg via INTRAVENOUS

## 2024-01-04 MED ORDER — OXYCODONE HCL 5 MG/5ML PO SOLN: 5.0000 mg | Freq: Once | ORAL | Status: DC | PRN

## 2024-01-04 MED ORDER — CHLORHEXIDINE GLUCONATE 0.12 % MT SOLN
15.0000 mL | Freq: Once | OROMUCOSAL | Status: AC
  Administered 2024-01-04: 15 mL via OROMUCOSAL

## 2024-01-04 MED ORDER — PROPOFOL 10 MG/ML IV BOLUS
INTRAVENOUS | Status: DC | PRN
  Administered 2024-01-04: 150 mg via INTRAVENOUS

## 2024-01-04 MED ORDER — SODIUM CHLORIDE 0.9 % IR SOLN
Status: DC | PRN
  Administered 2024-01-04: 3000 mL

## 2024-01-04 MED ORDER — CEFAZOLIN SODIUM-DEXTROSE 2-4 GM/100ML-% IV SOLN
2.0000 g | INTRAVENOUS | Status: AC
  Administered 2024-01-04: 2 g via INTRAVENOUS
  Filled 2024-01-04: qty 100

## 2024-01-04 MED ORDER — OXYCODONE HCL 5 MG PO TABS: 5.0000 mg | ORAL_TABLET | Freq: Once | ORAL | Status: DC | PRN

## 2024-01-04 MED ORDER — TAMSULOSIN HCL 0.4 MG PO CAPS
0.4000 mg | ORAL_CAPSULE | Freq: Every day | ORAL | 5 refills | Status: DC
  Filled 2024-01-04: qty 30, 30d supply, fill #0

## 2024-01-04 MED ORDER — ONDANSETRON HCL 4 MG/2ML IJ SOLN: 4.0000 mg | Freq: Once | INTRAMUSCULAR | Status: DC | PRN

## 2024-01-04 MED ORDER — METOPROLOL SUCCINATE ER 25 MG PO TB24
12.5000 mg | ORAL_TABLET | Freq: Every day | ORAL | 2 refills | Status: DC
  Filled 2024-01-04: qty 30, 60d supply, fill #0
  Filled 2024-02-02 (×2): qty 30, 60d supply, fill #1

## 2024-01-04 MED ORDER — LIDOCAINE HCL URETHRAL/MUCOSAL 2 % EX GEL
CUTANEOUS | Status: DC | PRN
  Administered 2024-01-04: 1

## 2024-01-04 MED ORDER — LIDOCAINE HCL (PF) 2 % IJ SOLN
INTRAMUSCULAR | Status: DC | PRN
  Administered 2024-01-04: 100 mg via INTRADERMAL

## 2024-01-04 MED ORDER — FENTANYL CITRATE (PF) 100 MCG/2ML IJ SOLN
INTRAMUSCULAR | Status: DC | PRN
  Administered 2024-01-04: 50 ug via INTRAVENOUS

## 2024-01-04 MED ORDER — MIDAZOLAM HCL 5 MG/5ML IJ SOLN
INTRAMUSCULAR | Status: DC | PRN
  Administered 2024-01-04: 2 mg via INTRAVENOUS

## 2024-01-04 MED ORDER — HYDROMORPHONE HCL 1 MG/ML IJ SOLN: 0.2500 mg | INTRAMUSCULAR | Status: DC | PRN

## 2024-01-04 MED ORDER — MIDAZOLAM HCL 2 MG/2ML IJ SOLN
INTRAMUSCULAR | Status: AC
  Filled 2024-01-04: qty 2

## 2024-01-04 SURGICAL SUPPLY — 18 items
BAG URO CATCHER STRL LF (MISCELLANEOUS) ×1 IMPLANT
BASKET ZERO TIP NITINOL 2.4FR (BASKET) IMPLANT
CATH URETL OPEN 5X70 (CATHETERS) IMPLANT
CLOTH BEACON ORANGE TIMEOUT ST (SAFETY) ×1 IMPLANT
GLOVE SURG LX STRL 8.0 MICRO (GLOVE) ×1 IMPLANT
GOWN SPEC L4 XLG W/TWL (GOWN DISPOSABLE) ×1 IMPLANT
GUIDEWIRE STR DUAL SENSOR (WIRE) ×1 IMPLANT
GUIDEWIRE ZIPWRE .038 STRAIGHT (WIRE) IMPLANT
KIT TURNOVER KIT A (KITS) IMPLANT
LASER FIB FLEXIVA PULSE ID 365 (Laser) IMPLANT
MANIFOLD NEPTUNE II (INSTRUMENTS) ×1 IMPLANT
PACK CYSTO (CUSTOM PROCEDURE TRAY) ×1 IMPLANT
SHEATH NAVIGATOR HD 12/14X36 (SHEATH) IMPLANT
SHEATH URETERAL FLEX 12X35 (SHEATH) IMPLANT
STENT URET 6FRX26 CONTOUR (STENTS) IMPLANT
TRACTIP FLEXIVA PULS ID 200XHI (Laser) IMPLANT
TUBING CONNECTING 10 (TUBING) ×1 IMPLANT
TUBING UROLOGY SET (TUBING) ×1 IMPLANT

## 2024-01-04 NOTE — Anesthesia Postprocedure Evaluation (Signed)
 Anesthesia Post Note  Patient: Timothy Browning  Procedure(s) Performed: CYSTOSCOPY, URETEROSCOPY, RETROGRADE PYELOGRAM, AND STENT INSERTION (Left)     Patient location during evaluation: PACU Anesthesia Type: General Level of consciousness: awake and alert and oriented Pain management: pain level controlled Vital Signs Assessment: post-procedure vital signs reviewed and stable Respiratory status: spontaneous breathing, nonlabored ventilation and respiratory function stable Cardiovascular status: blood pressure returned to baseline and stable Postop Assessment: no apparent nausea or vomiting Anesthetic complications: no   No notable events documented.  Last Vitals:  Vitals:   01/04/24 1105  BP: (!) 134/90  Pulse: 74  Resp: 18  Temp: 36.8 C  SpO2: 99%    Last Pain:  Vitals:   01/04/24 1202  TempSrc:   PainSc: 0-No pain                 Benno Brensinger A.

## 2024-01-04 NOTE — Op Note (Signed)
 OPERATIVE NOTE   Patient Name: Timothy Browning  MRN: [id number]   Date of Procedure: 01/04/24    Preoperative diagnosis:  Left ureteral calculus  Postoperative diagnosis:  Left ureteral calculus (passed into bladder)  Procedure:  Cystoscopy Left retrograde pyelogram with intra-operative interpretation Left ureteroscopy Stone removal from bladder Insertion of left ureteral stent (43F x 28 cm, with tether)  Attending: Milderd Meager, MD  Anesthesia: General  Estimated blood loss:  0 ml  Fluids: Per anesthesia record  Drains: 43F x 28 cm left ureteral stent with tether  Specimens: Stone fragment (given to patient)  Antibiotics: Ancef 2 gm IV  Findings:  -Lateral lobe enlargement of the prostate -Calculus noted in the bladder -Normal bladder mucosa -Left retrograde pyelogram with dilation of the distal left ureter  Indications:  53 year old male presents for surgical management of a left distal ureteral calculus.  He initially presented on 11/04/2023 with left lower quadrant pain.  CT imaging demonstrated a 9 mm calculus at the left UVJ with obstruction.  His creatinine was elevated at 2.09.  He underwent left shockwave lithotripsy on 11/08/2023.  He passed stone fragments following the procedure.  KUB from 11/18/2023 showed persistence of the calcification in the left pelvis with slight decrease in size.  Stone analysis demonstrated 80% calcium oxalate monohydrate and 20% calcium oxalate dihydrate.  Serial KUB imaging continues to show a 3 x 5 mm calcification in the left side of the pelvis consistent with a left distal calculus.  He is not aware of passing a stone.  He did have some increased lower urinary tract symptoms recently.  No flank or abdominal pain.  Description of Procedure:  The patient received IV Ancef preoperatively.  He was taken to the operating room suite and properly identified.  After successful induction of a general anesthetic, he was placed in the  dorsolithotomy position.  The patient's genital area was prepped and draped in sterile fashion.  A preoperative timeout was performed. Under direct visualization, a 21 French rigid cystoscope was passed through the urethra into the bladder.  The patient was noted to have by lobar enlargement of the prostate.  Upon entering the bladder a calculus was seen on the left side of the bladder base.  A normal-appearing trigone was seen with a single orifice bilaterally.  Inspection of the bladder demonstrated no other mucosal abnormalities. A scout film was obtained and showed the calcification within the bladder.  No other obvious calcifications were appreciated.  I attempted to place a open-ended catheter into the left UO using a guidewire but met some resistance in the distal ureter.  I was unable to pass the sensor wire into the distal ureter. At this point I used a semirigid ureteroscope to visually inspect the left distal ureter.  I was able to access the ureter without difficulty and passed the ureteroscope into the distal ureter.  No stone fragments were seen within the distal ureter.  Contrast was injected into the mid ureter through the ureteroscope.  There was some dilation of the distal and mid ureter noted.  No other filling defects were seen.  A sensor guidewire was then placed through the ureteroscope and passed into the left renal pelvis under fluoroscopic guidance.  An open-ended catheter was then passed over the guidewire and into the renal pelvis.  Contrast was injected demonstrating a normal left collecting system with no significant dilation.  With retraction of the open-ended catheter, contrast was injected showing no other filling defects within the  ureter.  The sensor guidewire was passed through the open-ended catheter.  Due to the initial difficulty with placement of the open-ended catheter into the left distal ureter, I elected to place a left ureteral stent.  A 6 French by 28 cm double-J stent  was passed over the guidewire and into the left collecting system under fluoroscopic guidance.  The tether was left attached to the stent and brought through the meatus.  The stone noted in the bladder was removed through the cystoscope.  The bladder was then drained and the cystoscope was removed.  Intraurethral lidocaine jelly was placed.  The patient was then extubated and taken to the post anesthesia care unit in stable condition.  Complications: None  Condition: Stable, extubated, transferred to PACU  Plan:  Continue the left ureteral stent x 3 days. Discharge to home

## 2024-01-04 NOTE — Anesthesia Procedure Notes (Addendum)
 Procedure Name: LMA Insertion Date/Time: 01/04/2024 12:59 PM  Performed by: Doran Clay, CRNAPre-anesthesia Checklist: Patient identified, Emergency Drugs available, Suction available, Patient being monitored and Timeout performed Patient Re-evaluated:Patient Re-evaluated prior to induction Oxygen Delivery Method: Circle system utilized Preoxygenation: Pre-oxygenation with 100% oxygen Induction Type: IV induction LMA: LMA inserted LMA Size: 5.0 Tube type: Oral Number of attempts: 1 Placement Confirmation: positive ETCO2 and breath sounds checked- equal and bilateral Tube secured with: Tape Dental Injury: Teeth and Oropharynx as per pre-operative assessment

## 2024-01-04 NOTE — Telephone Encounter (Signed)
 Copied from CRM [id number]. Topic: Clinical - Medication Refill >> Jan 04, 2024  8:22 AM Irine Seal wrote: Most Recent Primary Care Visit:  Provider: Faith Rogue  Department: [facility]  Visit Type: OPEN ESTABLISHED  Date: 11/15/2023  Medication: metoprolol succinate (TOPROL-XL) 25 MG 24 hr tablet  Has the patient contacted their pharmacy? Yes They told him to contact clinic   Is this the correct pharmacy for this prescription? Yes If no, delete pharmacy and type the correct one.  This is the patient's preferred pharmacy:  [facility] [address] [place] [address] Phone: [phone] Fax: [phone]   Has the prescription been filled recently? No  Is the patient out of the medication? 1 tablet left   Has the patient been seen for an appointment in the last year OR does the patient have an upcoming appointment? Yes  Can we respond through MyChart? Yes  Agent: Please be advised that Rx refills may take up to 3 business days. We ask that you follow-up with your pharmacy.

## 2024-01-04 NOTE — Transfer of Care (Signed)
 Immediate Anesthesia Transfer of Care Note  Patient: Timothy Browning  Procedure(s) Performed: CYSTOSCOPY, URETEROSCOPY, RETROGRADE PYELOGRAM, AND STENT INSERTION (Left)  Patient Location: PACU  Anesthesia Type:General  Level of Consciousness: sedated  Airway & Oxygen Therapy: Patient Spontanous Breathing and Patient connected to face mask oxygen  Post-op Assessment: Report given to RN and Post -op Vital signs reviewed and stable  Post vital signs: Reviewed and stable  Last Vitals:  Vitals Value Taken Time  BP 133/85 01/04/24 1346  Temp    Pulse 74 01/04/24 1349  Resp 16 01/04/24 1349  SpO2 97 % 01/04/24 1349  Vitals shown include unfiled device data.  Last Pain:  Vitals:   01/04/24 1202  TempSrc:   PainSc: 0-No pain         Complications: No notable events documented.

## 2024-01-04 NOTE — Interval H&P Note (Signed)
 History and Physical Interval Note:  01/04/2024 12:51 PM  Timothy Browning  has presented today for surgery, with the diagnosis of left ureteral stone.  The various methods of treatment have been discussed with the patient and family. After consideration of risks, benefits and other options for treatment, the patient has consented to  Procedure(s): CYSTOURETEROSCOPY, WITH RETROGRADE PYELOGRAM AND STENT INSERTION (Left) as a surgical intervention.  The patient's history has been reviewed, patient examined, no change in status, stable for surgery.  I have reviewed the patient's chart and labs.  Questions were answered to the patient's satisfaction.     Di Kindle

## 2024-01-05 ENCOUNTER — Encounter: Payer: Self-pay | Admitting: Urology

## 2024-01-05 ENCOUNTER — Encounter (HOSPITAL_COMMUNITY): Payer: Self-pay | Admitting: Urology

## 2024-01-07 ENCOUNTER — Ambulatory Visit: Admitting: Student

## 2024-01-07 VITALS — BP 125/74 | HR 98 | Temp 98.0°F | Ht 73.0 in | Wt 242.0 lb

## 2024-01-07 DIAGNOSIS — R079 Chest pain, unspecified: Secondary | ICD-10-CM | POA: Diagnosis not present

## 2024-01-07 DIAGNOSIS — M21371 Foot drop, right foot: Secondary | ICD-10-CM | POA: Diagnosis not present

## 2024-01-07 DIAGNOSIS — R42 Dizziness and giddiness: Secondary | ICD-10-CM | POA: Diagnosis not present
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
## 2024-01-07 NOTE — Assessment & Plan Note (Addendum)
 Recent ED visit for chest pain of unclear etiology, this has been ongoing problem for this gentleman.  He follows with cardiology and is currently wearing a Zio patch for evaluation of his chest pain.  There are also plans to repeat echo per cardiology.  He describes the pain is episodic, and can be variable in presentation.  At times he has left-sided chest pain that radiates to his arm, other times he has sharp stabbing pain over the left side of the chest.  He does have a history of aortic valve replacement which adds to his worry when he has these symptoms. Plan: Continue cardiology follow-up regarding ongoing chest pain.  Most recent ED without signs of myocardial damage or acute pathology.

## 2024-01-07 NOTE — Patient Instructions (Signed)
 Thank you, Timothy Browning for allowing [place] to provide your care today.   Referrals ordered today:    Referral Orders         Ambulatory Referral for DME        Follow up: 3 months for routine follow up    We look forward to seeing you next time. Please call our clinic at [phone] if you have any questions or concerns. The best time to call is Monday-Friday from 9am-4pm, but there is someone available 24/7. If after hours or the weekend, call the main hospital number and ask for the Internal Medicine Resident On-Call. If you need medication refills, please notify your pharmacy one week in advance and they will send [place] a request.   Thank you for trusting me with your care. Wishing you the best!  Lovie Macadamia MD [facility]

## 2024-01-07 NOTE — Assessment & Plan Note (Addendum)
 Ongoing dizziness in this gentleman.  He states that he has episodes of dizziness and lightheadedness where he feels faint and weak.  This primarily happens after he stands from a seated position, and last for about 5 to 10 seconds.  He is on tamsulosin which can exacerbate orthostatic symptoms, and he does state that he feels like his dizziness may have gotten worse following initiation of this medication.  He follows up with urology on Monday-they are the prescribers of this medication. Patient will inquire with Urology about discontinuing Flomax with Urology at upcoming visit (03/24).
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
## 2024-01-10 ENCOUNTER — Other Ambulatory Visit (HOSPITAL_COMMUNITY): Payer: Self-pay

## 2024-01-10 ENCOUNTER — Ambulatory Visit: Attending: Thoracic Surgery (Cardiothoracic Vascular Surgery) | Admitting: *Deleted

## 2024-01-10 ENCOUNTER — Encounter: Payer: Self-pay | Admitting: Urology

## 2024-01-10 ENCOUNTER — Ambulatory Visit (INDEPENDENT_AMBULATORY_CARE_PROVIDER_SITE_OTHER): Admitting: Urology

## 2024-01-10 VITALS — BP 129/87 | HR 85 | Ht 72.0 in | Wt 240.0 lb

## 2024-01-10 DIAGNOSIS — I480 Paroxysmal atrial fibrillation: Secondary | ICD-10-CM

## 2024-01-10 DIAGNOSIS — Z5181 Encounter for therapeutic drug level monitoring: Secondary | ICD-10-CM | POA: Diagnosis not present

## 2024-01-10 DIAGNOSIS — N201 Calculus of ureter: Secondary | ICD-10-CM

## 2024-01-10 DIAGNOSIS — I4891 Unspecified atrial fibrillation: Secondary | ICD-10-CM

## 2024-01-10 DIAGNOSIS — Z952 Presence of prosthetic heart valve: Secondary | ICD-10-CM | POA: Diagnosis not present

## 2024-01-10 DIAGNOSIS — Z09 Encounter for follow-up examination after completed treatment for conditions other than malignant neoplasm: Secondary | ICD-10-CM

## 2024-01-10 DIAGNOSIS — Z87442 Personal history of urinary calculi: Secondary | ICD-10-CM

## 2024-01-10 LAB — URINALYSIS, ROUTINE W REFLEX MICROSCOPIC
Bilirubin, UA: NEGATIVE
Glucose, UA: NEGATIVE
Ketones, UA: NEGATIVE
Nitrite, UA: NEGATIVE
Specific Gravity, UA: 1.025 (ref 1.005–1.030)
Urobilinogen, Ur: 0.2 mg/dL (ref 0.2–1.0)
pH, UA: 6 (ref 5.0–7.5)

## 2024-01-10 LAB — MICROSCOPIC EXAMINATION: RBC, Urine: >30 /HPF — AB (ref 0–2)

## 2024-01-10 LAB — POCT INR: POC INR: 1.3

## 2024-01-10 MED ORDER — ENOXAPARIN SODIUM 150 MG/ML IJ SOSY
150.0000 mg | PREFILLED_SYRINGE | INTRAMUSCULAR | 0 refills | Status: DC
  Filled 2024-01-10: qty 10, 10d supply, fill #0

## 2024-01-10 NOTE — Patient Instructions (Signed)
 Description   Continue Lovenox Injections Today take 5 tablets of warfarin Tomorrow take 4 tablets of Warfarin Then continue to take Warfarin 3 tablets daily.  Recheck INR in 1 week post procedure.  Stay consistent with greens each week-2 times per week.  Coumadin Clinic [phone] Amio stopped on 07/22/23.
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
## 2024-01-11 DIAGNOSIS — H40013 Open angle with borderline findings, low risk, bilateral: Secondary | ICD-10-CM | POA: Diagnosis not present

## 2024-01-11 DIAGNOSIS — H53121 Transient visual loss, right eye: Secondary | ICD-10-CM | POA: Diagnosis not present

## 2024-01-12 ENCOUNTER — Telehealth: Payer: Self-pay | Admitting: Cardiology

## 2024-01-12 ENCOUNTER — Encounter: Payer: Self-pay | Admitting: Cardiology

## 2024-01-12 ENCOUNTER — Encounter: Payer: Self-pay | Admitting: Student

## 2024-01-12 NOTE — Telephone Encounter (Signed)
 Dr. Meryl Dare is calling to speak with Dr. Servando Salina about the patient. Please advise

## 2024-01-12 NOTE — Telephone Encounter (Signed)
 Called and spoke to Dr. Juliann Pulse  Dr. Juliann Pulse states:   -Patient was referred by PCP due to blurred vision concern   -patient had 2 episodes of blurred vision one 5 months ago and one 2 months ago   -in each episode patient had blurry vision in inferior right eye, lasted about 2 months   -patient did not have headaches or nausea, ruled out migraine concern   -concerned blurred vision could be embolic, could be throwing plaque   -patient could benefit from carotid studies   -recent note was faxed to NP [facility] yesterday  Informed Dr. Juliann Pulse message sent to Dr. Servando Salina and NP Leota Jacobsen

## 2024-01-12 NOTE — Telephone Encounter (Signed)
 Tobb, Kardie, DO  You; Roosevelt, [place], NP; [place], [place], RNJust now (3:12 PM)    He needs to see his pcp for this.   Patient identification verified by 2 forms. Marilynn Rail, RN    Called and spoke to patient  Advised patient to contact PCP regarding request Patient verbalized understanding, no questions at this time

## 2024-01-13 ENCOUNTER — Ambulatory Visit: Payer: Medicaid Other | Admitting: Cardiology

## 2024-01-14 ENCOUNTER — Ambulatory Visit: Attending: Thoracic Surgery (Cardiothoracic Vascular Surgery)

## 2024-01-14 DIAGNOSIS — Z952 Presence of prosthetic heart valve: Secondary | ICD-10-CM | POA: Diagnosis not present

## 2024-01-14 DIAGNOSIS — Z5181 Encounter for therapeutic drug level monitoring: Secondary | ICD-10-CM

## 2024-01-14 DIAGNOSIS — I480 Paroxysmal atrial fibrillation: Secondary | ICD-10-CM

## 2024-01-14 DIAGNOSIS — I4891 Unspecified atrial fibrillation: Secondary | ICD-10-CM | POA: Diagnosis not present

## 2024-01-14 LAB — POCT INR: INR: 1.9 — AB (ref 2.0–3.0)

## 2024-01-14 NOTE — Patient Instructions (Signed)
 Description   Continue Lovenox Injections Take 4 tablets today and tomorrow, then resume same dosage of Warfarin 3 tablets daily.  Stay consistent with greens each week-2 times per week.  Coumadin Clinic [phone] Amio stopped on 07/22/23.

## 2024-01-16 NOTE — Progress Notes (Signed)
 Internal Medicine Clinic Attending  Case discussed with the resident at the time of the visit.  We reviewed the resident's history and exam and pertinent patient test results.  I agree with the assessment, diagnosis, and plan of care documented in the resident's note.

## 2024-01-17 ENCOUNTER — Encounter: Payer: Self-pay | Admitting: Student

## 2024-01-18 ENCOUNTER — Other Ambulatory Visit (HOSPITAL_COMMUNITY): Payer: Self-pay

## 2024-01-18 ENCOUNTER — Ambulatory Visit: Attending: Cardiology

## 2024-01-18 DIAGNOSIS — Z952 Presence of prosthetic heart valve: Secondary | ICD-10-CM | POA: Diagnosis not present

## 2024-01-18 DIAGNOSIS — N401 Enlarged prostate with lower urinary tract symptoms: Secondary | ICD-10-CM | POA: Diagnosis not present

## 2024-01-18 DIAGNOSIS — Z87898 Personal history of other specified conditions: Secondary | ICD-10-CM | POA: Diagnosis not present

## 2024-01-18 DIAGNOSIS — I480 Paroxysmal atrial fibrillation: Secondary | ICD-10-CM | POA: Diagnosis not present

## 2024-01-18 DIAGNOSIS — Z87442 Personal history of urinary calculi: Secondary | ICD-10-CM | POA: Diagnosis not present

## 2024-01-18 DIAGNOSIS — Z5181 Encounter for therapeutic drug level monitoring: Secondary | ICD-10-CM | POA: Diagnosis not present

## 2024-01-18 DIAGNOSIS — N138 Other obstructive and reflux uropathy: Secondary | ICD-10-CM | POA: Diagnosis not present

## 2024-01-18 DIAGNOSIS — N5201 Erectile dysfunction due to arterial insufficiency: Secondary | ICD-10-CM | POA: Diagnosis not present

## 2024-01-18 LAB — POCT INR: INR: 2.3 (ref 2.0–3.0)

## 2024-01-18 MED ORDER — TAMSULOSIN HCL 0.4 MG PO CAPS
0.4000 mg | ORAL_CAPSULE | Freq: Every day | ORAL | 3 refills | Status: DC
  Filled 2024-01-18: qty 90, 90d supply, fill #0

## 2024-01-18 NOTE — Patient Instructions (Signed)
 Stop Lovenox Injections Increase to 3 tablets daily, except 4 tablets every Wednesday.  INR in 2 weeks Stay consistent with greens each week-2 times per week.  Coumadin Clinic [phone] Amio stopped on 07/22/23.

## 2024-01-19 ENCOUNTER — Ambulatory Visit (INDEPENDENT_AMBULATORY_CARE_PROVIDER_SITE_OTHER): Admitting: Internal Medicine

## 2024-01-19 VITALS — BP 109/76 | HR 81 | Temp 98.7°F | Ht 72.0 in | Wt 241.4 lb

## 2024-01-19 DIAGNOSIS — M25422 Effusion, left elbow: Secondary | ICD-10-CM

## 2024-01-19 DIAGNOSIS — R079 Chest pain, unspecified: Secondary | ICD-10-CM

## 2024-01-19 DIAGNOSIS — H539 Unspecified visual disturbance: Secondary | ICD-10-CM | POA: Diagnosis not present

## 2024-01-19 DIAGNOSIS — H538 Other visual disturbances: Secondary | ICD-10-CM | POA: Diagnosis not present

## 2024-01-19 LAB — C-REACTIVE PROTEIN: CRP: 0.6 mg/dL (ref <1.0)

## 2024-01-19 LAB — SEDIMENTATION RATE: Sed Rate: 5 mm/h (ref 0–16)

## 2024-01-19 NOTE — Patient Instructions (Addendum)
 Thank you, [name] for allowing [place] to provide your care today.   Elbow This seems like you had a bad reaction to an insect bite. Please try benadryl cream which is over the counter. You can take benadryl at night to help with itching.  If you have additional episodes with this much swelling I do think it would be beneficial to send you to an allergist to have testing done.  Vision changes I have ordered the ultrasound of your carotid arteries.  We are working on getting that scheduled on the same day as your echo next week.  I would like to check your inflammatory markers as there are other causes of vision changes that I want to make sure not happening.  Chest discomfort I am curious if you are Zio patch will show that you are having heart palpitations or some other cause for your chest discomfort.  I have ordered the following labs for you:  Lab Orders         Sed Rate (ESR)         CRP (C-Reactive Protein)       Follow up: 3 months   We look forward to seeing you next time. Please call our clinic at [phone] if you have any questions or concerns. The best time to call is Monday-Friday from 9am-4pm, but there is someone available 24/7. If after hours or the weekend, call the main hospital number and ask for the Internal Medicine Resident On-Call. If you need medication refills, please notify your pharmacy one week in advance and they will send [place] a request.   Thank you for trusting me with your care. Wishing you the best!   Rudene Christians, DO [facility]

## 2024-01-19 NOTE — Progress Notes (Unsigned)
 Timothy Browning  h
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
## 2024-01-20 DIAGNOSIS — H539 Unspecified visual disturbance: Secondary | ICD-10-CM | POA: Insufficient documentation

## 2024-01-20 DIAGNOSIS — M25422 Effusion, left elbow: Secondary | ICD-10-CM | POA: Insufficient documentation

## 2024-01-20 NOTE — Assessment & Plan Note (Signed)
 A/P: Skin is red with some excoriations.  There are small papules present.  There remains some swelling above the olecranon not at that area of bursa.  This seems most consistent with an allergic reaction from an insect.  I encouraged him to pick up Benadryl cream and to take Benadryl at night to relieve symptoms.  He continues to have additional episodes would consider referring him to an allergist for testing.

## 2024-01-20 NOTE — Assessment & Plan Note (Signed)
 Vision changes concerning for GCA versus Amio 50 ago.  He had a cardiac catheterization done in 2023 that showed no obstructive disease.  His last LDL was fairly normal and he does not have a lot of other risk factors for vascular disease.  I did check inflammatory markers with CRP and ESR which were thankfully normal. P: Bilateral carotid ultrasound ordered

## 2024-01-20 NOTE — Assessment & Plan Note (Signed)
 He continues to have some ongoing chest pain.  Workup at ED was reassuring.  He just submitted Zio patch to cardiology and has an echocardiogram scheduled for next week. -Continue to follow-up with cardiology

## 2024-01-26 ENCOUNTER — Other Ambulatory Visit (HOSPITAL_COMMUNITY): Payer: Self-pay

## 2024-01-28 ENCOUNTER — Ambulatory Visit (HOSPITAL_COMMUNITY): Admission: RE | Admit: 2024-01-28 | Source: Ambulatory Visit

## 2024-01-29 DIAGNOSIS — Z419 Encounter for procedure for purposes other than remedying health state, unspecified: Secondary | ICD-10-CM | POA: Diagnosis not present

## 2024-01-29 NOTE — Progress Notes (Signed)
 Internal Medicine Clinic Attending  Case discussed with the resident at the time of the visit.  We reviewed the resident's history and exam and pertinent patient test results.  I agree with the assessment, diagnosis, and plan of care documented in the resident's note.

## 2024-02-01 ENCOUNTER — Ambulatory Visit: Attending: Cardiology

## 2024-02-01 DIAGNOSIS — I480 Paroxysmal atrial fibrillation: Secondary | ICD-10-CM

## 2024-02-01 DIAGNOSIS — Z5181 Encounter for therapeutic drug level monitoring: Secondary | ICD-10-CM

## 2024-02-01 DIAGNOSIS — Z952 Presence of prosthetic heart valve: Secondary | ICD-10-CM | POA: Diagnosis not present

## 2024-02-01 LAB — POCT INR: INR: 2.8 (ref 2.0–3.0)

## 2024-02-01 NOTE — Patient Instructions (Signed)
 Continue 3 tablets daily, except 4 tablets every Wednesday.  INR in 3 weeks Stay consistent with greens each week-2 times per week.  Coumadin Clinic [phone] Amio stopped on 07/22/23.

## 2024-02-02 ENCOUNTER — Other Ambulatory Visit: Payer: Self-pay | Admitting: Student

## 2024-02-02 ENCOUNTER — Other Ambulatory Visit (HOSPITAL_COMMUNITY): Payer: Self-pay

## 2024-02-02 MED ORDER — METOPROLOL SUCCINATE ER 25 MG PO TB24
25.0000 mg | ORAL_TABLET | Freq: Every day | ORAL | 2 refills | Status: DC
  Filled 2024-02-02: qty 30, 30d supply, fill #0
  Filled 2024-02-04: qty 90, 90d supply, fill #0

## 2024-02-02 NOTE — Telephone Encounter (Signed)
 Copied from CRM [id number]. Topic: Clinical - Prescription Issue >> Feb 02, 2024 10:07 AM Jayson Michael wrote: Reason for CRM: Patient called requesting a refill for Metoprolol Succinate (TOPROL-XL) 25 mg. He stated the pharmacy advised a new prescription is needed due to a dosage change from  tablet to a whole tablet. Chart shows a request was submitted today and is currently pending, but sending this just in case due to the noted dosage change.

## 2024-02-03 ENCOUNTER — Other Ambulatory Visit (HOSPITAL_COMMUNITY): Payer: Self-pay

## 2024-02-04 ENCOUNTER — Other Ambulatory Visit (HOSPITAL_COMMUNITY): Payer: Self-pay

## 2024-02-06 DIAGNOSIS — I4891 Unspecified atrial fibrillation: Secondary | ICD-10-CM

## 2024-02-06 DIAGNOSIS — R002 Palpitations: Secondary | ICD-10-CM | POA: Diagnosis not present

## 2024-02-10 DIAGNOSIS — H53121 Transient visual loss, right eye: Secondary | ICD-10-CM | POA: Diagnosis not present

## 2024-02-10 DIAGNOSIS — H40013 Open angle with borderline findings, low risk, bilateral: Secondary | ICD-10-CM | POA: Diagnosis not present

## 2024-02-14 DIAGNOSIS — M21371 Foot drop, right foot: Secondary | ICD-10-CM | POA: Diagnosis not present

## 2024-02-16 ENCOUNTER — Encounter: Payer: Self-pay | Admitting: Urology

## 2024-02-16 ENCOUNTER — Ambulatory Visit (INDEPENDENT_AMBULATORY_CARE_PROVIDER_SITE_OTHER): Admitting: Urology

## 2024-02-16 VITALS — BP 122/79 | HR 78 | Ht 72.0 in | Wt 239.0 lb

## 2024-02-16 DIAGNOSIS — N529 Male erectile dysfunction, unspecified: Secondary | ICD-10-CM

## 2024-02-16 DIAGNOSIS — R972 Elevated prostate specific antigen [PSA]: Secondary | ICD-10-CM

## 2024-02-16 DIAGNOSIS — N401 Enlarged prostate with lower urinary tract symptoms: Secondary | ICD-10-CM | POA: Diagnosis not present

## 2024-02-16 DIAGNOSIS — N201 Calculus of ureter: Secondary | ICD-10-CM

## 2024-02-16 DIAGNOSIS — N138 Other obstructive and reflux uropathy: Secondary | ICD-10-CM

## 2024-02-16 LAB — MICROSCOPIC EXAMINATION

## 2024-02-16 LAB — URINALYSIS, ROUTINE W REFLEX MICROSCOPIC
Bilirubin, UA: NEGATIVE
Glucose, UA: NEGATIVE
Ketones, UA: NEGATIVE
Leukocytes,UA: NEGATIVE
Nitrite, UA: NEGATIVE
Protein,UA: NEGATIVE
Specific Gravity, UA: 1.025 (ref 1.005–1.030)
Urobilinogen, Ur: 0.2 mg/dL (ref 0.2–1.0)
pH, UA: 5.5 (ref 5.0–7.5)
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
## 2024-02-17 ENCOUNTER — Encounter: Payer: Self-pay | Admitting: Urology

## 2024-02-17 LAB — PSA: Prostate Specific Ag, Serum: 5.4 ng/mL — ABNORMAL HIGH (ref 0.0–4.0)

## 2024-02-21 ENCOUNTER — Other Ambulatory Visit (HOSPITAL_COMMUNITY): Payer: Self-pay

## 2024-02-21 ENCOUNTER — Encounter: Payer: Self-pay | Admitting: Urology

## 2024-02-21 ENCOUNTER — Other Ambulatory Visit: Payer: Self-pay | Admitting: Urology

## 2024-02-21 DIAGNOSIS — R972 Elevated prostate specific antigen [PSA]: Secondary | ICD-10-CM

## 2024-02-21 MED ORDER — WARFARIN SODIUM 2.5 MG PO TABS
7.5000 mg | ORAL_TABLET | Freq: Every day | ORAL | 2 refills | Status: DC
  Filled 2024-02-21: qty 90, 30d supply, fill #0
  Filled 2024-03-21: qty 90, 30d supply, fill #1

## 2024-02-22 ENCOUNTER — Ambulatory Visit: Attending: Thoracic Surgery (Cardiothoracic Vascular Surgery)

## 2024-02-22 DIAGNOSIS — Z5181 Encounter for therapeutic drug level monitoring: Secondary | ICD-10-CM | POA: Diagnosis not present

## 2024-02-22 DIAGNOSIS — Z952 Presence of prosthetic heart valve: Secondary | ICD-10-CM | POA: Diagnosis not present

## 2024-02-22 DIAGNOSIS — I480 Paroxysmal atrial fibrillation: Secondary | ICD-10-CM

## 2024-02-22 LAB — POCT INR: INR: 2.5 (ref 2.0–3.0)

## 2024-02-22 NOTE — Patient Instructions (Signed)
 Continue 3 tablets daily, except 4 tablets every Wednesday.  INR in 5 weeks Stay consistent with greens each week-2 times per week.  Coumadin  Clinic [phone] Amio stopped on 07/22/23.

## 2024-02-25 ENCOUNTER — Encounter (HOSPITAL_COMMUNITY): Payer: Self-pay

## 2024-02-25 ENCOUNTER — Ambulatory Visit (HOSPITAL_COMMUNITY): Attending: Cardiology

## 2024-02-25 DIAGNOSIS — R002 Palpitations: Secondary | ICD-10-CM | POA: Diagnosis not present

## 2024-02-25 DIAGNOSIS — I5022 Chronic systolic (congestive) heart failure: Secondary | ICD-10-CM | POA: Diagnosis not present

## 2024-02-25 LAB — ECHOCARDIOGRAM COMPLETE
AV Mean grad: 5 mmHg
AV Peak grad: 8.6 mmHg
Ao pk vel: 1.47 m/s
Area-P 1/2: 4.15 cm2
Est EF: 55
S' Lateral: 4 cm

## 2024-02-28 ENCOUNTER — Ambulatory Visit: Attending: Emergency Medicine | Admitting: Emergency Medicine

## 2024-02-28 ENCOUNTER — Other Ambulatory Visit (HOSPITAL_COMMUNITY): Payer: Self-pay

## 2024-02-28 VITALS — BP 120/82 | HR 79 | Ht 72.0 in | Wt 243.0 lb

## 2024-02-28 DIAGNOSIS — I6523 Occlusion and stenosis of bilateral carotid arteries: Secondary | ICD-10-CM

## 2024-02-28 DIAGNOSIS — I4891 Unspecified atrial fibrillation: Secondary | ICD-10-CM | POA: Diagnosis not present

## 2024-02-28 DIAGNOSIS — Z419 Encounter for procedure for purposes other than remedying health state, unspecified: Secondary | ICD-10-CM | POA: Diagnosis not present

## 2024-02-28 DIAGNOSIS — I493 Ventricular premature depolarization: Secondary | ICD-10-CM | POA: Diagnosis not present

## 2024-02-28 DIAGNOSIS — I10 Essential (primary) hypertension: Secondary | ICD-10-CM

## 2024-02-28 DIAGNOSIS — I491 Atrial premature depolarization: Secondary | ICD-10-CM

## 2024-02-28 DIAGNOSIS — Z952 Presence of prosthetic heart valve: Secondary | ICD-10-CM

## 2024-02-28 DIAGNOSIS — R002 Palpitations: Secondary | ICD-10-CM | POA: Diagnosis not present

## 2024-02-28 DIAGNOSIS — I9789 Other postprocedural complications and disorders of the circulatory system, not elsewhere classified: Secondary | ICD-10-CM

## 2024-02-28 MED ORDER — METOPROLOL SUCCINATE ER 50 MG PO TB24
50.0000 mg | ORAL_TABLET | Freq: Every day | ORAL | 3 refills | Status: AC
  Filled 2024-02-28 – 2024-03-21 (×2): qty 90, 90d supply, fill #0
  Filled 2024-07-17: qty 90, 90d supply, fill #1
  Filled 2024-10-25: qty 90, 90d supply, fill #2

## 2024-02-28 NOTE — Progress Notes (Signed)
 " Cardiology Office Note:    Date:  02/29/2024  ID:  Timothy Browning, DOB 03-06-1971, MRN [id number] PCP: Norrine Sharper, MD  [facility] Providers Cardiologist:  Dub Huntsman, DO       Patient Profile:      Chief Complaint: 43-month follow-up for palpitations History of Present Illness:  Timothy Browning is a 53 y.o. male with visit-pertinent history of aortic aneurysm s/p repair with Bentall procedure 04/2023 utilizing a 29 mm aortic valved conduit (mechanical aortic valve), postoperative atrial fibrillation, hypertension, CKD, obesity, sleep apnea, carotid stenosis, diverticular bleed 06/2023   Cardiac catheterization 07/15/2022 showed normal coronary arteries.  ZIO monitor 11/17/2022 showed minimum heart rate of 54 bpm, max heart rate 178 bpm, average heart rate 86 bpm.  Symptoms are associated with rare PACs and rare PVCs.   He is s/p thoracic aortic aneurysm repair with Bentall procedure on 04/2023 utilizing a 29 mm aortic valve conduit (mechanical aortic valve).  He experienced postoperative atrial fibrillation was started on amiodarone .   He was admitted 07/04/2023 for gastrointestinal hemorrhage associated with intestinal diverticulosis.   He was seen by Dr. Huntsman on 07/22/2023 with improvement hemoglobin since hospitalization for diverticular bleed.  He was stable in sinus rhythm on amiodarone .  Amiodarone  was discontinued due to concern for potential long-term side effects with plan to monitor for A-fib recurrence with a 2-week monitor at 57-month mark post amiodarone .   ED visit 09/28/2023 for dizziness with position changes consistent with vertigo.  He was provided as needed meclizine .  Labs with improved hemoglobin at 13.7, creatinine 1.51, GFR 56 which was slightly decreased from prior.   Last seen in clinic on 10/04/2023 by Reche, NP.  Patient had presented with a chief complaint of dizziness for about 2 weeks.  He was continued on meclizine  as needed.  He also noted episodes  of increased heart rate, potentially related to anxiety.  He was continued on metoprolol  and instructed he could take an extra half tablet as needed for palpitations.  Last seen in office on 12/28/2023.  He was dealing with ongoing symptoms of shortness of breath, palpitations, and occasional dizziness which have been ongoing for several months.  His metoprolol  was increased to 12.5 mg daily to 25 mg daily.  Heart monitor was ordered showing a normal/unremarkable study with symptoms associated with sinus rhythm.  Echocardiogram was ordered and completed on 02/25/2024 showing LVEF 55%, no RWMA, moderate LVH, RV function mildly reduced, RV size mildly enlarged, normal PASP, normal structure and function of the aortic valve prosthesis, mild dilation of ascending aorta measuring 44 mm, borderline dilation of aortic arch measuring 33 mm.   Discussed the use of AI scribe software for clinical note transcription with the patient, who gave verbal consent to proceed.  History of Present Illness Timothy Browning is a 54 year old male who presents today by himself for 44-month follow-up for evaluation of palpitations and dyspnea.  He continues to experience palpitations occurring up to twice a day or once every few days, lasting 10 to 30 seconds. The sensation is described as a 'fluttering' and occurs at rest, such as when sitting or driving. During episodes, he feels a thumping sensation. Previous ZIO monitoring and echocardiogram showed normal heart function and no arrhythmias. He takes metoprolol  succinate 25 mg daily. His dyspnea has improved. Denies SOB, DOE, orthopnea, PND.  He is not having any chest pains and denies any exertional angina.  He has noticed weight gain from 233-235 pounds to  243 pounds and occasional swelling in his feet and ankles. He attributes this to dietary factors.  Review of systems:  Please see the history of present illness. All other systems are reviewed and otherwise negative.      Home Medications:    Current Meds  Medication Sig   acetaminophen  (TYLENOL ) 500 MG tablet Take 1,000 mg by mouth every 6 (six) hours as needed for moderate pain (pain score 4-6).   aspirin  EC 81 MG tablet Take 1 tablet (81 mg total) by mouth daily. Swallow whole.   cyclobenzaprine  (FLEXERIL ) 5 MG tablet Take 5 mg by mouth 3 (three) times daily as needed for muscle spasms.   docusate sodium  (COLACE) 100 MG capsule Take 100 mg by mouth as needed.   meclizine  (ANTIVERT ) 25 MG tablet Take 1 tablet (25 mg total) by mouth 3 (three) times daily as needed for dizziness.   metoprolol  succinate (TOPROL -XL) 50 MG 24 hr tablet Take 1 tablet (50 mg total) by mouth daily. Take with or immediately following a meal.   oxycodone  (OXY-IR) 5 MG capsule Take 5 mg by mouth every 4 (four) hours as needed for pain.   phenazopyridine  (PYRIDIUM ) 200 MG tablet Take 1 tablet (200 mg total) by mouth 3 (three) times daily as needed for pain.   rosuvastatin  (CRESTOR ) 5 MG tablet Take 1 tablet (5 mg total) by mouth daily.   sildenafil (VIAGRA) 100 MG tablet Take 100 mg by mouth daily as needed for erectile dysfunction.   warfarin (COUMADIN ) 2.5 MG tablet Take 3 tablets (7.5 mg total) by mouth daily. OR as directed by anticoagulation clinic based on INR.   [DISCONTINUED] metoprolol  succinate (TOPROL -XL) 25 MG 24 hr tablet Take 1 tablet (25 mg total) by mouth daily with breakfast.   [DISCONTINUED] tamsulosin  (FLOMAX ) 0.4 MG CAPS capsule Take 1 capsule (0.4 mg total) by mouth daily.   [DISCONTINUED] warfarin (COUMADIN ) 2.5 MG tablet 3 tabs by mouth daily or as directed by anticoagulation clinic based on INR.         Studies Reviewed:   Echocardiogram 02/25/2024 1. Left ventricular ejection fraction, by estimation, is 55%. Left  ventricular ejection fraction by 3D volume is 52 %. The left ventricle has  low normal function. The left ventricle has no regional wall motion  abnormalities. There is moderate concentric   left  ventricular hypertrophy. Left ventricular diastolic parameters are  indeterminate.   2. Right ventricular systolic function is mildly reduced. The right  ventricular size is mildly enlarged. Mildly increased right ventricular  wall thickness. There is normal pulmonary artery systolic pressure.   3. The mitral valve is normal in structure. No evidence of mitral valve  regurgitation. No evidence of mitral stenosis.   4. The aortic valve has been repaired/replaced. Aortic valve  regurgitation is trivial. There is a 29 mm mechanical valve present in the  aortic position. Procedure Date: 05/06/2023. Echo findings are consistent  with normal structure and function of the  aortic valve prosthesis.   5. Aortic dilatation noted. There is mild dilatation of the ascending  aorta, measuring 44 mm. There is borderline dilatation of the aortic arch,  measuring 33 mm.   6. The inferior vena cava is normal in size with greater than 50%  respiratory variability, suggesting right atrial pressure of 3 mmHg.   ZIO 12/28/2023 Patch Wear Time:  12 days and 0 hours (2025-03-19T14:54:00-0400 to 2025-03-31T15:24:27-0400)   Patient had a min HR of 59 bpm, max HR of 146 bpm, and avg  HR of 84 bpm. Predominant underlying rhythm was Sinus Rhythm. Isolated SVEs were rare (<1.0%), SVE Couplets were rare (<1.0%), and SVE Triplets were rare (<1.0%). Isolated VEs were rare (<1.0%),  VE Couplets were rare (<1.0%), and no VE Triplets were present.    Symptoms associated with sinus rhythm.   Conclusion: Normal/unremarkable study Risk Assessment/Calculations:    CHA2DS2-VASc Score = 2   This indicates a 2.2% annual risk of stroke. The patient's score is based upon: CHF History: 1 HTN History: 1 Diabetes History: 0 Stroke History: 0 Vascular Disease History: 0 Age Score: 0 Gender Score: 0            Physical Exam:   VS:  BP 120/82 (BP Location: Left Arm, Patient Position: Sitting, Cuff Size: Large)   Pulse 79    Ht 6' (1.829 m)   Wt 243 lb (110.2 kg)   BMI 32.96 kg/m    Wt Readings from Last 3 Encounters:  02/28/24 243 lb (110.2 kg)  02/16/24 239 lb (108.4 kg)  01/19/24 241 lb 6.4 oz (109.5 kg)    GEN: Well nourished, well developed in no acute distress NECK: No JVD; No carotid bruits CARDIAC: RRR, no murmurs, rubs, gallops.  Mechanical valve click noted RESPIRATORY:  Clear to auscultation without rales, wheezing or rhonchi  ABDOMEN: Soft, non-tender, non-distended EXTREMITIES:  No edema; No acute deformity     Assessment and Plan:  Palpitations / PACs / PVCs  ZIO 12/2023 was normal/unremarkable study with symptoms associated with sinus rhythm - He continues to experience palpitations as further noted in HPI.  Possibly related to anxiety.  Overall recent ZIO monitor was reassuring.  ZIO 10/2022 did show rare symptomatic PACs and rare symptomatic PVCs - Will increase his metoprolol  XL from 25 mg to 50 mg daily - He was previously on flecainide  (total of 2 months) for his PACs/PVCs however was discontinued after bentall procedure as he developed postoperative atrial fibrillation and was started on amiodarone  at that time.  He was never started back on flecainide  in the outpatient setting after discharge from the hospital in 04/2023. - If his palpitations do continue can trial flecainide  in the future  Postoperative atrial fibrillation History of postoperative atrial fibrillation s/p thoracic aortic aneurysm repair with Bentall procedure on 04/2023 Was placed on amiodarone  at discharge 04/2023 and since been discontinued on 07/2023 with plan to repeat ZIO at 26-months off amiodarone  therapy Recent ZIO 01/2024 showed no AF reoccurrence - On OAC due to mechanical valve   S/p Bentall Procedure and Mechanical Aortic valve on 04/2023 No current symptoms related to surgery Echocardiogram 02/2024 showed findings consistent with normal structure and function of aortic valve prosthesis.  Mild dilation of  ascending aorta measuring 44 mm and borderline dilation of aortic arch measuring 33 mm. - On Coumadin  and managed by Coumadin  clinic - Continue metoprolol  XL 50 mg daily - Can plan for repeat echocardiogram in 1 year for routine monitoring   Hypertension BP today is 120/82 and well-controlled - Continue current antihypertensive regimen   Carotid artery disease Carotid artery duplex 04/2023 showed bilateral ICA consistent with 1-39% stenosis - Follow-up carotid duplex has been ordered by PCP however yet to be completed.  Patient will get this scheduled - Continue aspirin  81 mg daily and rosuvastatin  5 mg daily     Dispo:  Return in about 6 months (around 08/30/2024).  Signed, Lum LITTIE Louis, NP  "

## 2024-02-28 NOTE — Patient Instructions (Signed)
 Medication Instructions:  INCREASE YOUR METOPROLOL  SUCCINATE TO 50 MG DAILY.   Lab Work: NONE   Testing/Procedures: NONE  Follow-Up: At [facility], you and your health needs are our priority.  As part of our continuing mission to provide you with exceptional heart care, our providers are all part of one team.  This team includes your primary Cardiologist (physician) and Advanced Practice Providers or APPs (Physician Assistants and Nurse Practitioners) who all work together to provide you with the care you need, when you need it.  Your next appointment:   6 MONTHS  Provider:   Kardie Tobb, DO ONLY

## 2024-02-29 ENCOUNTER — Encounter: Payer: Self-pay | Admitting: Emergency Medicine

## 2024-03-01 ENCOUNTER — Other Ambulatory Visit (HOSPITAL_COMMUNITY)

## 2024-03-08 ENCOUNTER — Other Ambulatory Visit (HOSPITAL_COMMUNITY): Payer: Self-pay

## 2024-03-21 ENCOUNTER — Other Ambulatory Visit (HOSPITAL_COMMUNITY): Payer: Self-pay

## 2024-03-22 DIAGNOSIS — G4733 Obstructive sleep apnea (adult) (pediatric): Secondary | ICD-10-CM | POA: Diagnosis not present

## 2024-03-26 ENCOUNTER — Ambulatory Visit
Admission: RE | Admit: 2024-03-26 | Discharge: 2024-03-26 | Disposition: A | Discharge status: Home / Self Care | Source: Ambulatory Visit | Attending: Urology | Admitting: Urology

## 2024-03-26 DIAGNOSIS — R972 Elevated prostate specific antigen [PSA]: Secondary | ICD-10-CM

## 2024-03-26 MED ORDER — GADOPICLENOL 0.5 MMOL/ML IV SOLN
10.0000 mL | Freq: Once | INTRAVENOUS | Status: AC | PRN
  Administered 2024-03-26: 10 mL via INTRAVENOUS

## 2024-03-27 ENCOUNTER — Other Ambulatory Visit: Payer: Self-pay | Admitting: Student

## 2024-03-27 NOTE — Telephone Encounter (Unsigned)
 Copied from CRM [id number]. Topic: Clinical - Medication Refill >> Mar 27, 2024  1:30 PM Blair Bumpers wrote: Medication: cyclobenzaprine  (FLEXERIL ) 5 MG tablet & Hydroxyzine  50 mg(not listed)  Has the patient contacted their pharmacy? No (Agent: If no, request that the patient contact the pharmacy for the refill. If patient does not wish to contact the pharmacy document the reason why and proceed with request.) (Agent: If yes, when and what did the pharmacy advise?)  This is the patient's preferred pharmacy:  [facility] [address], [address] [place] [address] Phone: [phone] Fax: [phone]   Is this the correct pharmacy for this prescription? Yes If no, delete pharmacy and type the correct one.   Has the prescription been filled recently? No  Is the patient out of the medication? Yes  Has the patient been seen for an appointment in the last year OR does the patient have an upcoming appointment? Yes  Can we respond through MyChart? Yes  Agent: Please be advised that Rx refills may take up to 3 business days. We ask that you follow-up with your pharmacy.

## 2024-03-28 ENCOUNTER — Other Ambulatory Visit (HOSPITAL_COMMUNITY): Payer: Self-pay

## 2024-03-28 ENCOUNTER — Ambulatory Visit: Attending: Thoracic Surgery (Cardiothoracic Vascular Surgery) | Admitting: *Deleted

## 2024-03-28 DIAGNOSIS — I4891 Unspecified atrial fibrillation: Secondary | ICD-10-CM | POA: Diagnosis present

## 2024-03-28 DIAGNOSIS — I480 Paroxysmal atrial fibrillation: Secondary | ICD-10-CM | POA: Diagnosis present

## 2024-03-28 DIAGNOSIS — Z952 Presence of prosthetic heart valve: Secondary | ICD-10-CM | POA: Insufficient documentation

## 2024-03-28 DIAGNOSIS — Z5181 Encounter for therapeutic drug level monitoring: Secondary | ICD-10-CM | POA: Diagnosis present

## 2024-03-28 LAB — POCT INR: INR: 2.9 (ref 2.0–3.0)

## 2024-03-28 MED ORDER — CYCLOBENZAPRINE HCL 5 MG PO TABS
5.0000 mg | ORAL_TABLET | Freq: Three times a day (TID) | ORAL | 2 refills | Status: AC | PRN
  Filled 2024-03-28 – 2024-04-10 (×2): qty 90, 30d supply, fill #0

## 2024-03-28 MED ORDER — HYDROXYZINE HCL 50 MG PO TABS
50.0000 mg | ORAL_TABLET | Freq: Three times a day (TID) | ORAL | 0 refills | Status: AC | PRN
  Filled 2024-03-28 – 2024-04-10 (×2): qty 30, 10d supply, fill #0

## 2024-03-28 NOTE — Patient Instructions (Addendum)
 Description   Continue taking warfarin 3 tablets daily, except 4 tablets every Wednesday.  INR in 6 weeks Stay consistent with greens each week-2 times per week.  Coumadin  Clinic [phone] Amio stopped on 07/22/23.

## 2024-03-29 ENCOUNTER — Telehealth: Payer: Self-pay | Admitting: *Deleted

## 2024-03-29 ENCOUNTER — Other Ambulatory Visit (HOSPITAL_COMMUNITY): Payer: Self-pay

## 2024-03-29 NOTE — Telephone Encounter (Signed)
 Attempted to call patient about appointment change and no answer. Appointment put in mail.

## 2024-03-30 DIAGNOSIS — Z419 Encounter for procedure for purposes other than remedying health state, unspecified: Secondary | ICD-10-CM | POA: Diagnosis not present

## 2024-03-31 ENCOUNTER — Ambulatory Visit (INDEPENDENT_AMBULATORY_CARE_PROVIDER_SITE_OTHER): Admitting: Physician Assistant

## 2024-03-31 ENCOUNTER — Encounter: Payer: Self-pay | Admitting: Physician Assistant

## 2024-03-31 DIAGNOSIS — M170 Bilateral primary osteoarthritis of knee: Secondary | ICD-10-CM

## 2024-03-31 MED ORDER — METHYLPREDNISOLONE ACETATE 40 MG/ML IJ SUSP
13.3300 mg | INTRAMUSCULAR | Status: AC | PRN
  Administered 2024-03-31: 13.33 mg via INTRA_ARTICULAR

## 2024-03-31 MED ORDER — BUPIVACAINE HCL 0.25 % IJ SOLN
0.6600 mL | INTRAMUSCULAR | Status: AC | PRN
  Administered 2024-03-31: .66 mL via INTRA_ARTICULAR

## 2024-03-31 MED ORDER — LIDOCAINE HCL 1 % IJ SOLN
3.0000 mL | INTRAMUSCULAR | Status: AC | PRN
  Administered 2024-03-31: 3 mL
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
## 2024-04-04 ENCOUNTER — Ambulatory Visit: Payer: Self-pay | Admitting: Urology

## 2024-04-10 ENCOUNTER — Other Ambulatory Visit (HOSPITAL_COMMUNITY): Payer: Self-pay

## 2024-04-10 ENCOUNTER — Ambulatory Visit: Admitting: Student

## 2024-04-10 VITALS — BP 130/80 | HR 82 | Ht 72.0 in | Wt 239.8 lb

## 2024-04-10 DIAGNOSIS — N1831 Chronic kidney disease, stage 3a: Secondary | ICD-10-CM

## 2024-04-10 DIAGNOSIS — I10 Essential (primary) hypertension: Secondary | ICD-10-CM

## 2024-04-10 DIAGNOSIS — N183 Chronic kidney disease, stage 3 unspecified: Secondary | ICD-10-CM | POA: Diagnosis not present

## 2024-04-10 DIAGNOSIS — R5383 Other fatigue: Secondary | ICD-10-CM | POA: Diagnosis not present

## 2024-04-10 DIAGNOSIS — I5022 Chronic systolic (congestive) heart failure: Secondary | ICD-10-CM | POA: Diagnosis not present

## 2024-04-10 DIAGNOSIS — I130 Hypertensive heart and chronic kidney disease with heart failure and stage 1 through stage 4 chronic kidney disease, or unspecified chronic kidney disease: Secondary | ICD-10-CM | POA: Diagnosis not present

## 2024-04-10 DIAGNOSIS — N4289 Other specified disorders of prostate: Secondary | ICD-10-CM

## 2024-04-10 NOTE — Progress Notes (Unsigned)
  Patient name: Timothy Browning Date of birth: 11/21/1970 Date of visit: 04/10/24  Subjective   Chief concern: worried about results of prostate MRI.  Prostate MRI showed PI-RADS 3 lesions on both sides of the prostate. Has had a slow stream for a long time.   ROS  Current Outpatient Medications  Medication Instructions   acetaminophen  (TYLENOL ) 1,000 mg, Every 6 hours PRN   aspirin  EC 81 mg, Oral, Daily, Swallow whole.   cyclobenzaprine  (FLEXERIL ) 5 mg, Oral, 3 times daily PRN   docusate sodium  (COLACE) 100 mg, As needed   enoxaparin  (LOVENOX ) 150 mg, Subcutaneous, Every 24 hours   hydrOXYzine  (ATARAX ) 50 mg, Oral, 3 times daily PRN   meclizine  (ANTIVERT ) 25 mg, Oral, 3 times daily PRN   metoprolol  succinate (TOPROL -XL) 50 mg, Oral, Daily, Take with or immediately following a meal.   oxycodone  (OXY-IR) 5 mg, Every 4 hours PRN   phenazopyridine  (PYRIDIUM ) 200 mg, Oral, 3 times daily PRN   rosuvastatin  (CRESTOR ) 5 mg, Oral, Daily   sildenafil (VIAGRA) 100 mg, Daily PRN   tamsulosin  (FLOMAX ) 0.4 mg, Oral, Daily   warfarin (COUMADIN ) 7.5 mg, Oral, Daily, OR as directed by anticoagulation clinic based on INR.     Objective  Today's Vitals   04/10/24 1346  BP: 130/80  Pulse: 82  SpO2: 98%  Weight: 239 lb 12.8 oz (108.8 kg)  Height: 6' (1.829 m)  Body mass index is 32.52 kg/m.   Physical Exam   Assessment & Plan  Problem List Items Addressed This Visit   None  No follow-ups on file.  [name] MD 04/10/2024, 2:16 PM

## 2024-04-10 NOTE — Patient Instructions (Signed)
 Return in about 3 months (around 07/11/2024) for routine follow-up.  Remember to bring all of the medications that you take (including over the counter medications and supplements) with you to every clinic visit.  This after visit summary is an important review of tests, referrals, and medication changes that were discussed during your visit. If you have questions or concerns, call [phone]. Outside of clinic business hours, call the main hospital at [phone] and ask the operator for the on-call internal medicine resident.   [name] MD 04/10/2024, 2:51 PM

## 2024-04-11 ENCOUNTER — Encounter: Payer: Self-pay | Admitting: Student

## 2024-04-11 ENCOUNTER — Ambulatory Visit: Payer: Self-pay | Admitting: Student

## 2024-04-11 DIAGNOSIS — N4289 Other specified disorders of prostate: Secondary | ICD-10-CM | POA: Insufficient documentation

## 2024-04-11 LAB — BMP8+ANION GAP
Anion Gap: 15 mmol/L (ref 10.0–18.0)
BUN/Creatinine Ratio: 14 (ref 9–20)
BUN: 20 mg/dL (ref 6–24)
CO2: 20 mmol/L (ref 20–29)
Calcium: 9.2 mg/dL (ref 8.7–10.2)
Chloride: 104 mmol/L (ref 96–106)
Creatinine, Ser: 1.45 mg/dL — ABNORMAL HIGH (ref 0.76–1.27)
Glucose: 83 mg/dL (ref 70–99)
Potassium: 4.2 mmol/L (ref 3.5–5.2)
Sodium: 139 mmol/L (ref 134–144)
eGFR: 58 mL/min/{1.73_m2} — ABNORMAL LOW (ref >59)

## 2024-04-11 LAB — VITAMIN D 25 HYDROXY (VIT D DEFICIENCY, FRACTURES): Vit D, 25-Hydroxy: 33.8 ng/mL (ref 30.0–100.0)

## 2024-04-11 LAB — VITAMIN B12: Vitamin B-12: 459 pg/mL (ref 232–1245)

## 2024-04-11 NOTE — Assessment & Plan Note (Signed)
 Remains fatigued, suspect poor sleep due to health-related anxiety is contributing. Vitamin B12 and vitamin D  normal.

## 2024-04-11 NOTE — Telephone Encounter (Signed)
 Called and discussed labs over the phone. Will route yesterday's encounter to Dr. Roseann of urology and this person's anticoagulation clinic, anticipating prostate biopsy in near future.  [name] MD 04/11/2024, 2:17 PM

## 2024-04-11 NOTE — Assessment & Plan Note (Signed)
 Doing well, continue metoprolol  succinate 50 mg daily.

## 2024-04-11 NOTE — Assessment & Plan Note (Signed)
 Two discrete lesions in an otherwise enlarged prostate. No transcapsular spread. Suspicious for prostate cancer. Seems like benefits of biopsy outweigh risks for him, he'll call his urologist to work on setting this up and I will message as well. He is anticoagulated for aortic valve replacement with warfarin and will need Lovenox  bridging, thus I will also message his anticoagulation clinic.

## 2024-04-11 NOTE — Assessment & Plan Note (Signed)
 Well-compensated. Last echo showed LVEF improvement to 55% but some RV systolic dysfunction. Remains on metoprolol  succinate 50 mg daily. He follows with cardiology.

## 2024-04-11 NOTE — Assessment & Plan Note (Signed)
 Kidney function stable in setting of ongoing lower urinary tract symptoms.

## 2024-04-12 ENCOUNTER — Other Ambulatory Visit: Payer: Self-pay | Admitting: Urology

## 2024-04-12 ENCOUNTER — Telehealth: Payer: Self-pay | Admitting: *Deleted

## 2024-04-12 DIAGNOSIS — R972 Elevated prostate specific antigen [PSA]: Secondary | ICD-10-CM

## 2024-04-12 NOTE — Telephone Encounter (Signed)
 Patient returned RN's call.

## 2024-04-12 NOTE — Telephone Encounter (Signed)
 Spoke with pt and he states his prostate biopsy procedure date is not until 06/08/24 in [place] and he is aware that he will need the clearance form and lovenox . He states he will reach out to them regarding this clearance form that will be needed.He was thankful for the call and advised to call back if needed.

## 2024-04-12 NOTE — Telephone Encounter (Signed)
 Called to return call, no answer. Left message on voicemail.

## 2024-04-12 NOTE — Telephone Encounter (Addendum)
 Called and had to leave a message on the patient's voicemail to call back since I received a message that he will be having a prostate procedure in the future. He will need to have clearance faxed over for this, fax to [phone]. Will await a call back or update on next in office appointment.

## 2024-04-13 ENCOUNTER — Telehealth: Payer: Self-pay | Admitting: *Deleted

## 2024-04-13 ENCOUNTER — Telehealth: Payer: Self-pay

## 2024-04-13 NOTE — Telephone Encounter (Signed)
   Pre-operative Risk Assessment    Patient Name: Timothy Browning  DOB: 11-28-1970 MRN: [id number]   Date of last office visit: 02/28/24 MADISON FOUNTAIN, NP Date of next office visit: NONE   Request for Surgical Clearance    Procedure:  FUSION Bx   Date of Surgery:  Clearance 06/08/24                                Surgeon:  DR. FRANCISCA Surgeon's Group or Practice Name:  [facility] Phone number:  [phone] Fax number:  [phone]   Type of Clearance Requested:   - Medical  - Pharmacy:  Hold Aspirin  and Warfarin (Coumadin )     Type of Anesthesia:  Not Indicated   Additional requests/questions:    Bonney Niels Jest   04/13/2024, 3:31 PM

## 2024-04-13 NOTE — Telephone Encounter (Signed)
 Cardiac clearance faxed to [phone].

## 2024-04-13 NOTE — Telephone Encounter (Signed)
-----   Message from [place] B sent at 04/13/2024  1:36 PM EDT ----- Regarding: Clearance for Fusion BX in [place] We will need to get the clearance for this. Can one of you get with [facility] and keep her updated please.   Thanks, Rosaline ----- Message ----- From: Roseann Adine PARAS., MD Sent: 04/12/2024  12:11 PM EDT To: Madelin CHRISTELLA Pamalee Renaee Trina  Please refer to [facility] for fusion biopsy for elevated PSA and abnormal MRI. He is on warfarin for aortic valve replacement and a lovenox  bridge will need to be arranged through coag clinic prior to biopsy.  His insurance is not accepted at [facility].

## 2024-04-14 NOTE — Telephone Encounter (Signed)
 Pharmacy please advise on holding warfarin prior to Fusion Bx scheduled for 06/08/2024. Thank you.

## 2024-04-14 NOTE — Telephone Encounter (Signed)
 Timothy Browning,  You saw this patient on 02/28/2024. Per protocol we request that you comment on his cardiac risk to proceed with fusion biopsy on  06/08/2024 since it has been less than 2 months since evaluated in the office. Please send your comment to P CV Pre-Op Pool.  Thank you, Lamarr Satterfield DNP, ANP, AACC.

## 2024-04-17 ENCOUNTER — Other Ambulatory Visit (HOSPITAL_COMMUNITY): Payer: Self-pay

## 2024-04-17 ENCOUNTER — Other Ambulatory Visit: Payer: Self-pay | Admitting: Nurse Practitioner

## 2024-04-17 ENCOUNTER — Other Ambulatory Visit: Payer: Self-pay

## 2024-04-17 DIAGNOSIS — I480 Paroxysmal atrial fibrillation: Secondary | ICD-10-CM

## 2024-04-17 MED ORDER — WARFARIN SODIUM 2.5 MG PO TABS
7.5000 mg | ORAL_TABLET | Freq: Every day | ORAL | 0 refills | Status: DC
Start: 2024-04-17 — End: 2024-07-17
  Filled 2024-04-17: qty 270, 90d supply, fill #0

## 2024-04-17 NOTE — Telephone Encounter (Signed)
   Patient Name: Timothy Browning  DOB: January 16, 1971 MRN: [id number]  Primary Cardiologist: Kardie Tobb, DO  Chart reviewed as part of pre-operative protocol coverage. Given past medical history and time since last visit, based on ACC/AHA guidelines, Timothy Browning is at acceptable risk for the planned procedure without further cardiovascular testing.   Per office protocol, patient can hold warfarin for 5 days prior to procedure.   Patient WILL  need bridging with Lovenox  (enoxaparin ) around procedure. INR followed by [facility]. They are aware to bridge for upcoming procedure.   The patient was advised that if he develops new symptoms prior to surgery to contact our office to arrange for a follow-up visit, and he verbalized understanding.  I will route this recommendation to the requesting party via Epic fax function and remove from pre-op pool.  Please call with questions.  Wyn Raddle, Jackee Shove, NP 04/17/2024, 2:06 PM

## 2024-04-17 NOTE — Telephone Encounter (Addendum)
 Patient with diagnosis of atrial fibrillation and mechanical aortic valve on warfarin for anticoagulation.    Procedure:  FUSION Bx    Date of Surgery:  Clearance 06/08/24      CHA2DS2-VASc Score = 2   This indicates a 2.2% annual risk of stroke. The patient's score is based upon: CHF History: 1 HTN History: 1 Diabetes History: 0 Stroke History: 0 Vascular Disease History: 0 Age Score: 0 Gender Score: 0   CrCl 92 Platelet count 300  Patient has not had an Afib/aflutter ablation within the last 3 months or DCCV within the last 30 days  Per office protocol, patient can hold warfarin for 5 days prior to procedure.   Patient WILL  need bridging with Lovenox  (enoxaparin ) around procedure.  **This guidance is not considered finalized until pre-operative APP has relayed final recommendations.**  INR followed by [facility].  They are aware to bridge for upcoming procedure.

## 2024-04-17 NOTE — Telephone Encounter (Signed)
 Prescription refill request received for warfarin Lov: 02/28/24 [facility])   Next INR check: 05/09/24 Warfarin tablet strength: 2.5mg   Appropriate dose. Refill sent.

## 2024-04-18 NOTE — Progress Notes (Signed)
 Anticoag encounter

## 2024-04-19 ENCOUNTER — Other Ambulatory Visit (HOSPITAL_COMMUNITY): Payer: Self-pay

## 2024-04-24 NOTE — Progress Notes (Signed)
 Internal Medicine Clinic Attending  Case discussed with the resident at the time of the visit.  We reviewed the resident's history and exam and pertinent patient test results.  I agree with the assessment, diagnosis, and plan of care documented in the resident's note.

## 2024-04-29 DIAGNOSIS — Z419 Encounter for procedure for purposes other than remedying health state, unspecified: Secondary | ICD-10-CM | POA: Diagnosis not present

## 2024-05-01 ENCOUNTER — Encounter: Payer: Self-pay | Admitting: Student

## 2024-05-01 ENCOUNTER — Ambulatory Visit (INDEPENDENT_AMBULATORY_CARE_PROVIDER_SITE_OTHER)

## 2024-05-01 VITALS — BP 139/83 | HR 75 | Temp 98.6°F | Ht 72.0 in | Wt 237.8 lb

## 2024-05-01 DIAGNOSIS — L918 Other hypertrophic disorders of the skin: Secondary | ICD-10-CM | POA: Diagnosis not present

## 2024-05-01 HISTORY — DX: Other hypertrophic disorders of the skin: L91.8

## 2024-05-01 NOTE — Progress Notes (Signed)
 Acute Office Visit  Subjective:     Patient ID: Timothy Browning, male    DOB: 06/03/1971, 53 y.o.   MRN: [id number]  Chief Complaint  Patient presents with   Skin Tag    Right arm Wants removed    Timothy Browning is a 52 yr old male with a history of CHF, A-fib, OSA, CKD3, prostate mass, currently on Warfarin, who presents to clinic today for an acute visit regarding a skin lesion he noticed recently.   Patient reports he has had a skin tag on his right bicep which has double in size within the past week. He also reports a burning sensation occasionally over it, but denies any drainage from the area. Otherwise, he has been feeling well and able to use his arm without issue.   ROS: Denies headaches, dizziness, fever, chills, runny nose, sore throat, vision changes, hearing changes, chest pain, shortness of breath, difficulty breathing, nausea, vomiting, abdominal pain. Denies increased urinary frequency, pain with urination, constipation or diarrhea. No recent falls.       Objective:    BP 139/83 (BP Location: Left Arm, Patient Position: Sitting, Cuff Size: Normal)   Pulse 75   Temp 98.6 F (37 C) (Oral)   Ht 6' (1.829 m)   Wt 237 lb 12.8 oz (107.9 kg)   SpO2 99%   BMI 32.25 kg/m  BP Readings from Last 3 Encounters:  05/01/24 139/83  04/10/24 130/80  02/28/24 120/82   Wt Readings from Last 3 Encounters:  05/01/24 237 lb 12.8 oz (107.9 kg)  04/10/24 239 lb 12.8 oz (108.8 kg)  02/28/24 243 lb (110.2 kg)      Physical Exam:   Constitutional: well-appearing male sitting in exam chair, in no acute distress. Ambulates without use of assistance device  HEENT: normocephalic atraumatic, mucous membranes moist Eyes: conjunctiva non-erythematous Cardiovascular: regular rate and rhythm, brisk capillary refill bilateral hands  Pulmonary/Chest: normal work of breathing on room air, lungs clear to auscultation bilaterally MSK: normal bulk and tone. Neurological: alert & oriented x  3 Skin: warm and dry, skin tag with thickened base and no erythema or drainage     Psych: mood calm, behavior normal, thought content normal, judgement normal    No results found for any visits on 05/01/24.      Assessment & Plan:   Problem List Items Addressed This Visit       Musculoskeletal and Integument   Skin tag - Primary   Patient with a skin tag on his right bicep (see pic in media tab) which he first noticed months ago, however in the past week has doubled in size. He notes some occasional burning pain sensation over the area which comes and goes. There is no evidence of erythema or infection. He is unable to take NSAIDs due to his CKD3, recommended if he needs something for pain to take Tylenol . He has previously had skin tags removed and had seen Dr. Madison Louder for this, although not recently. We will place a referral to dermatology for skin tag removal. He will send us  a MyChart message if he is unable to be scheduled soon with them. He was given return precautions including signs and symptoms of infection and worsening to let us  know.       Relevant Orders   Ambulatory referral to Dermatology    No orders of the defined types were placed in this encounter.   Return in about 5 weeks (around 06/07/2024) for Routine recheck .  Patient discussed with Dr. Lovie, who also saw and evaluated the patient.  Doyal Miyamoto, MD [facility]  PGY-1  05/01/24, 4:01 PM

## 2024-05-01 NOTE — Assessment & Plan Note (Signed)
 Patient with a skin tag on his right bicep (see pic in media tab) which he first noticed months ago, however in the past week has doubled in size. He notes some occasional burning pain sensation over the area which comes and goes. There is no evidence of erythema or infection. He is unable to take NSAIDs due to his CKD3, recommended if he needs something for pain to take Tylenol . He has previously had skin tags removed and had seen Dr. Madison Louder for this, although not recently. We will place a referral to dermatology for skin tag removal. He will send us  a MyChart message if he is unable to be scheduled soon with them. He was given return precautions including signs and symptoms of infection and worsening to let us  know.

## 2024-05-01 NOTE — Patient Instructions (Signed)
 Thank you, Mr.Thoams L Scroggs for allowing us  to provide your care today. Today we discussed the following:  - Skin tag on your right arm, please let us  know if you cannot see Dermatology soon   Referrals ordered today:   Referral Orders         Ambulatory referral to Dermatology       I have ordered the following medication/changed the following medications:   Stop the following medications: Medications Discontinued During This Encounter  Medication Reason   enoxaparin  (LOVENOX ) 150 MG/ML injection Completed Course   tamsulosin  (FLOMAX ) 0.4 MG CAPS capsule Completed Course     Start the following medications: No orders of the defined types were placed in this encounter.    Follow up: with Dermatology for skin tag, and please keep your regular follow up with us  in August    Remember: If the area around the skin tag reddens, grows more in size, you have drainage from the area, any fever or chills, please contact us  immediately to let us  know.   Should you have any questions or concerns please call the Internal Medicine Clinic at [phone].     Doyal Miyamoto, MD [facility]

## 2024-05-02 ENCOUNTER — Encounter: Payer: Self-pay | Admitting: Student

## 2024-05-02 NOTE — Progress Notes (Signed)
Internal Medicine Clinic Attending  I was physically present during the key portions of the resident provided service and participated in the medical decision making of patient's management care. I reviewed pertinent patient test results.  The assessment, diagnosis, and plan were formulated together and I agree with the documentation in the resident's note.  Mercie Eon, MD
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
## 2024-05-08 ENCOUNTER — Ambulatory Visit

## 2024-05-08 VITALS — BP 121/89 | HR 73 | Temp 98.2°F | Ht 72.0 in | Wt 238.8 lb

## 2024-05-08 DIAGNOSIS — L918 Other hypertrophic disorders of the skin: Secondary | ICD-10-CM

## 2024-05-08 DIAGNOSIS — I4891 Unspecified atrial fibrillation: Secondary | ICD-10-CM

## 2024-05-08 NOTE — Assessment & Plan Note (Signed)
 Patient is s/p AV replacement atrial fibrillation. He continues Warfarin, Meclizine , and Metoprolol  with INR monitored through the [facility] office. He notes today that he has had trouble in getting scheduled for an appointment with his Cardiologist. He is due to have INR checked tomorrow, and plans to stop by their office to obtain an appointment. He had a few episodes of heart fluttering without pain that subsided on its own. We discussed that if these symptoms return and persist or he experiences difficulty breathing or chest pain that he should go to the ED. I will try to reach out to his Cardiology team to let them know as well.

## 2024-05-08 NOTE — Assessment & Plan Note (Signed)
 Patient presented to clinic today for Procedure only visit for skin tag removal. Examination of the area prior to procedure revealed unchanged since 1 week prior (see pic in Media tab).   Procedure Name: Right upper arm skin tag excision removal  Indication: Pain, growing in size  Location: Right upper arm Pre-Procedure Diagnosis: Skin Tag  Post-Procedure Diagnosis: Skin tag, removed Informed consent: Procedure, alternate treatment options, risks, and benefits were thoroughly explained to the patient and informed consent was obtained before the procedure started.  PROCEDURE The area was prepped with betadine and draped. Local anesthesia achieved with  2 cc of  Lidocaine  1% without epinephrine .  A minimal incision was made at the base and the skin tag excised. Estimated blood loss was less than 1cc. Direct pressure held with gauze for 60+ seconds. Gauze, tegaderm and coban applied to area. The patient tolerated the procedure well. Patient counseled about wound care, when to call us  or go to the ED.   He is on Warfarin, so we discussed proper application of pressure to stop the bleeding. Unfortunately we do not have cauterization in office, but he will let us  know if stasis not achieved. Given return precautions including inability to achieve blood stasis of excision site, large bruising, redness and evidence of infection. He will return as needed for this.

## 2024-05-08 NOTE — Patient Instructions (Signed)
 Thank you, Mr.Loxley L Vantassel for allowing us  to provide your care today. Today we discussed the following:  Great to see you today Bette. Following the skin tag removal today, you will likely have some bleeding due to your blood thinner. Apply pressure with gauze over the area, and you may use ice as needed for any pain control (making sure to put a layer between your skin and the ice). Should you develop severe bruising, bleeding, redness, or pus from the area, please contact us  immediately. Keep the area clean and dry, and you may put a band-aid to cover during daily activities, but allow it open to air when possible.    Should you have any questions or concerns please call the Internal Medicine Clinic at [phone].     Doyal Miyamoto, MD [facility]

## 2024-05-09 ENCOUNTER — Ambulatory Visit: Attending: Internal Medicine | Admitting: *Deleted

## 2024-05-09 DIAGNOSIS — I480 Paroxysmal atrial fibrillation: Secondary | ICD-10-CM | POA: Insufficient documentation

## 2024-05-09 DIAGNOSIS — Z952 Presence of prosthetic heart valve: Secondary | ICD-10-CM | POA: Insufficient documentation

## 2024-05-09 LAB — POCT INR: INR: 2.1 (ref 2.0–3.0)

## 2024-05-09 NOTE — Patient Instructions (Signed)
 Description   Today take 3.5 tablets then continue taking warfarin 3 tablets daily, except 4 tablets every Wednesday.  INR in 3 weeks (normally 6 weeks).  Stay consistent with greens each week-2 times per week.  Coumadin  Clinic [phone] Amio stopped on 07/22/23.

## 2024-05-09 NOTE — Progress Notes (Signed)
 INR 2.1 Please see anticoagulation encounter

## 2024-05-09 NOTE — Progress Notes (Signed)
 Internal Medicine Clinic Attending  I was physically present during the key portions of the resident provided service and participated in the medical decision making of patient's management care. I reviewed pertinent patient test results.  The assessment, diagnosis, and plan were formulated together and I agree with the documentation in the resident's note.  Lovie Clarity, MD   I directly supervised the procedure. Patient tolerated skin tag removal well with no immediate complications.

## 2024-05-10 ENCOUNTER — Encounter: Payer: Self-pay | Admitting: Cardiology

## 2024-05-15 ENCOUNTER — Other Ambulatory Visit (HOSPITAL_COMMUNITY): Payer: Self-pay

## 2024-05-19 ENCOUNTER — Other Ambulatory Visit (HOSPITAL_COMMUNITY): Payer: Self-pay

## 2024-05-19 ENCOUNTER — Ambulatory Visit (HOSPITAL_COMMUNITY)
Admission: RE | Admit: 2024-05-19 | Discharge: 2024-05-19 | Disposition: A | Discharge status: Home / Self Care | Source: Ambulatory Visit | Attending: Vascular Surgery | Admitting: Vascular Surgery

## 2024-05-19 DIAGNOSIS — H538 Other visual disturbances: Secondary | ICD-10-CM | POA: Diagnosis not present

## 2024-05-23 ENCOUNTER — Other Ambulatory Visit: Payer: Self-pay

## 2024-05-23 ENCOUNTER — Other Ambulatory Visit (HOSPITAL_COMMUNITY): Payer: Self-pay

## 2024-05-23 MED ORDER — METOPROLOL SUCCINATE ER 25 MG PO TB24
12.5000 mg | ORAL_TABLET | Freq: Every evening | ORAL | 3 refills | Status: AC
  Filled 2024-05-23: qty 45, 90d supply, fill #0

## 2024-05-23 NOTE — Progress Notes (Signed)
 Metoprolol  succinate 12.5 mg nightly sent into preferred pharmacy.

## 2024-05-25 ENCOUNTER — Other Ambulatory Visit (HOSPITAL_COMMUNITY): Payer: Self-pay

## 2024-05-29 ENCOUNTER — Other Ambulatory Visit: Payer: Self-pay

## 2024-05-30 ENCOUNTER — Ambulatory Visit: Attending: Thoracic Surgery (Cardiothoracic Vascular Surgery)

## 2024-05-30 ENCOUNTER — Other Ambulatory Visit: Payer: Self-pay

## 2024-05-30 ENCOUNTER — Other Ambulatory Visit (HOSPITAL_COMMUNITY): Payer: Self-pay

## 2024-05-30 DIAGNOSIS — Z952 Presence of prosthetic heart valve: Secondary | ICD-10-CM | POA: Insufficient documentation

## 2024-05-30 DIAGNOSIS — Z5181 Encounter for therapeutic drug level monitoring: Secondary | ICD-10-CM | POA: Insufficient documentation

## 2024-05-30 DIAGNOSIS — Z419 Encounter for procedure for purposes other than remedying health state, unspecified: Secondary | ICD-10-CM | POA: Diagnosis not present

## 2024-05-30 DIAGNOSIS — I480 Paroxysmal atrial fibrillation: Secondary | ICD-10-CM | POA: Diagnosis not present

## 2024-05-30 LAB — POCT INR: INR: 2.6 (ref 2.0–3.0)

## 2024-05-30 MED ORDER — ENOXAPARIN SODIUM 150 MG/ML IJ SOSY
150.0000 mg | PREFILLED_SYRINGE | INTRAMUSCULAR | 1 refills | Status: DC
  Filled 2024-05-30: qty 7, 7d supply, fill #0

## 2024-05-30 NOTE — Progress Notes (Signed)
 INR 2.6; Please see anticoagulation encounter

## 2024-05-30 NOTE — Patient Instructions (Addendum)
 continue taking warfarin 3 tablets daily, except 4 tablets every Wednesday.  INR in 2 weeks (normally 6 weeks). Prostate Biopsy 8/21 Holding Warfarin 8/16-20 Stay consistent with greens each week-2 times per week.  Coumadin  Clinic [phone] Amio stopped on 07/22/23.   8/15: Last dose of warfarin.  8/16: No warfarin or enoxaparin  (Lovenox ).  8/17: Inject enoxaparin  150mg  in the fatty abdominal tissue at least 2 inches from the belly button twice a day about 24 hours apart, 8am  rotate sites. No warfarin.  8/18: Inject enoxaparin  in the fatty tissue every 24 hours, 8am . No warfarin.  8/19: Inject enoxaparin  in the fatty tissue every 24 hours, 8am. No warfarin.  8/20: Inject enoxaparin  in the fatty tissue in the morning at 8 am. No warfarin.  8/21: Procedure Day - No enoxaparin  - Resume warfarin in the evening or as directed by doctor (take an extra half tablet with usual dose for 2 days then resume normal dose).  8/22: Resume enoxaparin  inject in the fatty tissue every 24 hours and take warfarin  8/23: Inject enoxaparin  in the fatty tissue every 24 hours and take warfarin  8/24: Inject enoxaparin  in the fatty tissue every 24 hours and take warfarin  8/25: Inject enoxaparin  in the fatty tissue every 24 hours and take warfarin  8/26: Inject enoxaparin  in the fatty tissue every 24 hours and take warfarin  8/27: warfarin appt to check INR.

## 2024-06-05 NOTE — Telephone Encounter (Signed)
 This encounter was created in error - please disregard.

## 2024-06-07 ENCOUNTER — Encounter: Payer: Self-pay | Admitting: Student

## 2024-06-07 ENCOUNTER — Ambulatory Visit: Admitting: Student

## 2024-06-07 VITALS — BP 112/73 | HR 79 | Temp 98.9°F | Ht 72.0 in | Wt 240.2 lb

## 2024-06-07 DIAGNOSIS — R002 Palpitations: Secondary | ICD-10-CM | POA: Diagnosis not present

## 2024-06-07 DIAGNOSIS — R4589 Other symptoms and signs involving emotional state: Secondary | ICD-10-CM | POA: Diagnosis not present

## 2024-06-07 NOTE — Assessment & Plan Note (Signed)
 Chronic, contributing to poor quality of life of late.  No readily identifiable arrhythmic cause.  I doubt that these were ever related to his aortic disease although I am uncertain.  Offered some reassurance that to date no malignant cause of his palpitations have been identified.

## 2024-06-07 NOTE — Assessment & Plan Note (Signed)
 Lots of very important and life altering health issues over the course the last year including major cardiothoracic surgery and upcoming prostate biopsy.  Will see if he can see a counselor in person for some coping strategies while he is going through this difficult time.

## 2024-06-07 NOTE — Progress Notes (Signed)
 Patient name: Timothy Browning Date of birth: 1970/11/13 Date of visit: 06/07/24  Subjective   Reason for visit: ongoing heart palpitations  Chronic problem.  Started before surgery for aortic root dilatation and ascending aortic aneurysm.  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  This causes daily anxiety and discomfort.  No fainting or passing out.  He is also having a prostate biopsy tomorrow.  I reviewed May 2025 echocardiogram results which show stable to improved dilatation of the aortic root and ascending aorta since his surgery last year.  I reviewed April 2025 ambulatory cardiac monitoring summary which showed sinus rhythm average heart rate of 84 bpm with rare supraventricular ectopic beats and rare ventricular ectopic beats.  Symptoms at that time were associated with sinus rhythm.  Current Outpatient Medications  Medication Instructions   acetaminophen  (TYLENOL ) 1,000 mg, Every 6 hours PRN   aspirin  EC 81 mg, Oral, Daily, Swallow whole.   cyclobenzaprine  (FLEXERIL ) 5 mg, Oral, 3 times daily PRN   docusate sodium  (COLACE) 100 mg, As needed   enoxaparin  (LOVENOX ) 150 mg, Subcutaneous, Every 24 hours   hydrOXYzine  (ATARAX ) 50 mg, Oral, 3 times daily PRN   meclizine  (ANTIVERT ) 25 mg, Oral, 3 times daily PRN   metoprolol  succinate (TOPROL  XL) 12.5 mg, Oral, Nightly   metoprolol  succinate (TOPROL -XL) 50 mg, Oral, Daily, Take with or immediately following a meal.   oxycodone  (OXY-IR) 5 mg, Every 4 hours PRN   phenazopyridine  (PYRIDIUM ) 200 mg, Oral, 3 times daily PRN   rosuvastatin  (CRESTOR ) 5 mg, Oral, Daily   sildenafil (VIAGRA) 100 mg, Daily PRN   warfarin (COUMADIN ) 2.5 MG tablet Take 3 to 4 tablets (7.5-10 mg total) by mouth daily as directed by coumadin  clinic.     Objective  Today's Vitals   06/07/24 0946  BP: 112/73  Pulse: 79  Temp: 98.9 F (37.2 C)  TempSrc: Oral  SpO2: 95%   Weight: 240 lb 3.2 oz (109 kg)  Height: 6' (1.829 m)  Body mass index is 32.58 kg/m.   Physical Exam Constitutional:      Appearance: Normal appearance.  Cardiovascular:     Rate and Rhythm: Normal rate and regular rhythm.     Comments: Audible click of prosthetic aortic valve Pulmonary:     Effort: Pulmonary effort is normal. No respiratory distress.     Breath sounds: Normal breath sounds.  Skin:    General: Skin is warm and dry.  Neurological:     Mental Status: He is alert.     Cranial Nerves: No facial asymmetry.  Psychiatric:        Mood and Affect: Affect normal.        Speech: Speech normal.        Behavior: Behavior normal.      Assessment & Plan   Anxiety about health Assessment & Plan: Lots of very important and life altering health issues over the course the last year including major cardiothoracic surgery and upcoming prostate biopsy.  Will see if he can see a counselor in person for some coping strategies while he is going through this difficult time.  Orders: -     Amb ref to Integrated Behavioral [facility]  Palpitations Assessment & Plan: Chronic, contributing to poor quality of life of late.  No readily identifiable arrhythmic cause.  I doubt that these were ever related to his aortic disease although I am uncertain.  Offered some reassurance that to  date no malignant cause of his palpitations have been identified.     Return in about 3 months (around 09/07/2024) for wellness exam.  Timothy Kung MD 06/07/2024, 12:30 PM

## 2024-06-07 NOTE — Patient Instructions (Signed)
 Remember to bring all of the medications that you take (including over the counter medications and supplements) with you to every clinic visit.  This after visit summary is an important review of tests, referrals, and medication changes that were discussed during your visit. If you have questions or concerns, call [phone]. Outside of clinic business hours, call the main hospital at [phone] and ask the operator for the on-call internal medicine resident.   [name] MD 06/07/2024, 12:29 PM

## 2024-06-08 ENCOUNTER — Ambulatory Visit (INDEPENDENT_AMBULATORY_CARE_PROVIDER_SITE_OTHER): Admitting: Urology

## 2024-06-08 ENCOUNTER — Other Ambulatory Visit: Payer: Self-pay | Admitting: Urology

## 2024-06-08 VITALS — BP 120/72 | HR 56

## 2024-06-08 DIAGNOSIS — Z2989 Encounter for other specified prophylactic measures: Secondary | ICD-10-CM | POA: Diagnosis not present

## 2024-06-08 DIAGNOSIS — N4289 Other specified disorders of prostate: Secondary | ICD-10-CM | POA: Diagnosis not present

## 2024-06-08 DIAGNOSIS — R972 Elevated prostate specific antigen [PSA]: Secondary | ICD-10-CM | POA: Diagnosis not present

## 2024-06-08 DIAGNOSIS — C61 Malignant neoplasm of prostate: Secondary | ICD-10-CM

## 2024-06-08 MED ORDER — GENTAMICIN SULFATE 40 MG/ML IJ SOLN
80.0000 mg | Freq: Once | INTRAMUSCULAR | Status: AC
Start: 2024-06-08 — End: 2024-06-08
  Administered 2024-06-08: 80 mg via INTRAMUSCULAR

## 2024-06-08 MED ORDER — LEVOFLOXACIN 500 MG PO TABS
500.0000 mg | ORAL_TABLET | Freq: Once | ORAL | Status: AC
  Administered 2024-06-08: 500 mg via ORAL

## 2024-06-08 NOTE — Progress Notes (Signed)
 Internal Medicine Clinic Attending  Case discussed with the resident at the time of the visit.  We reviewed the resident's history and exam and pertinent patient test results.  I agree with the assessment, diagnosis, and plan of care documented in the resident's note.

## 2024-06-08 NOTE — Patient Instructions (Signed)

## 2024-06-08 NOTE — Progress Notes (Signed)
   06/08/24  Indication: Elevated PSA 5.4, abnormal prostate MRI  MRI Fusion Prostate Biopsy Procedure   Informed consent was obtained, and we discussed the risks of bleeding and infection/sepsis. A time out was performed to ensure correct patient identity.  Pre-Procedure: - Last PSA Level: 5.4 - Gentamicin  and levaquin  given for antibiotic prophylaxis - Prostate measured 43 g on MRI, PSA density 0.13 - MRI lesions correlated with hypoechoic regions on TRUS  Procedure: - Prostate block performed using 10 cc 1% lidocaine   - MRI fusion biopsy was performed:  ROI#1: 3 cores ROI#2: 3 cores  - Standard biopsies deferred based on need to resume coumadin  tomorrow, low PSA density, and excellent biopsies of ROI's - Total of 6 cores taken  Post-Procedure: - Patient tolerated the procedure well - He was counseled to seek immediate medical attention if experiences significant bleeding, fevers, or severe pain - Return in one week to discuss biopsy results  Assessment/ Plan: Will follow up in 1-2 weeks to discuss pathology with Dr. Roseann Redell Burnet, MD 06/08/2024

## 2024-06-13 ENCOUNTER — Telehealth: Payer: Self-pay

## 2024-06-13 LAB — PROSTATE CORE NEEDLE BIOPSY
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
## 2024-06-13 NOTE — Telephone Encounter (Signed)
 Timothy Browning

## 2024-06-14 ENCOUNTER — Encounter: Payer: Self-pay | Admitting: Urology

## 2024-06-14 ENCOUNTER — Other Ambulatory Visit (HOSPITAL_COMMUNITY): Payer: Self-pay

## 2024-06-14 ENCOUNTER — Ambulatory Visit (INDEPENDENT_AMBULATORY_CARE_PROVIDER_SITE_OTHER)

## 2024-06-14 ENCOUNTER — Ambulatory Visit: Attending: Cardiovascular Disease | Admitting: Cardiology

## 2024-06-14 ENCOUNTER — Ambulatory Visit: Payer: Self-pay | Admitting: Urology

## 2024-06-14 ENCOUNTER — Telehealth: Payer: Self-pay | Admitting: *Deleted

## 2024-06-14 VITALS — BP 120/71 | HR 80 | Ht 72.0 in | Wt 240.0 lb

## 2024-06-14 DIAGNOSIS — Z952 Presence of prosthetic heart valve: Secondary | ICD-10-CM

## 2024-06-14 DIAGNOSIS — I480 Paroxysmal atrial fibrillation: Secondary | ICD-10-CM | POA: Insufficient documentation

## 2024-06-14 DIAGNOSIS — G4733 Obstructive sleep apnea (adult) (pediatric): Secondary | ICD-10-CM | POA: Diagnosis not present

## 2024-06-14 LAB — POCT INR: INR: 1.7 — AB (ref 2.0–3.0)

## 2024-06-14 MED ORDER — ENOXAPARIN SODIUM 150 MG/ML IJ SOSY
150.0000 mg | PREFILLED_SYRINGE | INTRAMUSCULAR | 0 refills | Status: AC
  Filled 2024-06-14: qty 2, 2d supply, fill #0

## 2024-06-14 NOTE — Telephone Encounter (Signed)
 Per Dr Shlomo, 1 year followup - can you call him and let him know that I got the download and he is only having 1 event an hour and we want it under 5 per hour and so he looks great and his usage is excellent!!

## 2024-06-14 NOTE — Telephone Encounter (Signed)
 Patient understands his AHI showed normal. Pt is aware and agreeable to normal results. The patient has been notified of the result and verbalized understanding.  All questions (if any) were answered. Timothy Browning, CMA 06/14/2024 9:03 AM

## 2024-06-14 NOTE — Telephone Encounter (Signed)
 Timothy Browning

## 2024-06-14 NOTE — Progress Notes (Signed)
 Description   INR 1.7 Take 5 tablets today and take 4 tablets tomorrow, then resume same dosage of Warfarin 3 tablets daily, except 4 tablets every Wednesdays.  Continue on Lovenox  150mg  once daily in the am x 3 more dosages ( 3 days), discontinue Lovenox  after Friday am dosage.   Recheck INR in 1 weeks (normally 6 weeks). Stay consistent with greens each week-2 times per week.  Coumadin  Clinic [phone]

## 2024-06-14 NOTE — Patient Instructions (Signed)
 Description   INR 1.7 Take 5 tablets today and take 4 tablets tomorrow, then resume same dosage of Warfarin 3 tablets daily, except 4 tablets every Wednesdays.  Continue on Lovenox  150mg  once daily in the am x 3 more dosages ( 3 days), discontinue Lovenox  after Friday am dosage.   Recheck INR in 1 weeks (normally 6 weeks). Stay consistent with greens each week-2 times per week.  Coumadin  Clinic [phone]

## 2024-06-14 NOTE — Patient Instructions (Signed)

## 2024-06-19 DIAGNOSIS — G4733 Obstructive sleep apnea (adult) (pediatric): Secondary | ICD-10-CM | POA: Diagnosis not present

## 2024-06-21 ENCOUNTER — Ambulatory Visit: Attending: Thoracic Surgery (Cardiothoracic Vascular Surgery)

## 2024-06-21 ENCOUNTER — Ambulatory Visit: Admitting: Urology

## 2024-06-21 DIAGNOSIS — Z952 Presence of prosthetic heart valve: Secondary | ICD-10-CM | POA: Insufficient documentation

## 2024-06-21 DIAGNOSIS — I480 Paroxysmal atrial fibrillation: Secondary | ICD-10-CM | POA: Insufficient documentation

## 2024-06-21 LAB — POCT INR: INR: 2.2 (ref 2.0–3.0)

## 2024-06-21 NOTE — Patient Instructions (Signed)
 Description   INR 2.2 Take 4 tablets today and tomorrow, then resume same dosage of Warfarin 3 tablets daily, except 4 tablets every Wednesdays.    Recheck INR in 2 weeks (normally 6 weeks). Stay consistent with greens each week-2 times per week.  Coumadin  Clinic [phone]

## 2024-06-21 NOTE — Progress Notes (Signed)
 Description   INR 2.2 Take 4 tablets today and tomorrow, then resume same dosage of Warfarin 3 tablets daily, except 4 tablets every Wednesdays.    Recheck INR in 2 weeks (normally 6 weeks). Stay consistent with greens each week-2 times per week.  Coumadin  Clinic [phone]

## 2024-06-22 ENCOUNTER — Ambulatory Visit (INDEPENDENT_AMBULATORY_CARE_PROVIDER_SITE_OTHER): Admitting: Licensed Clinical Social Worker

## 2024-06-22 DIAGNOSIS — R4589 Other symptoms and signs involving emotional state: Secondary | ICD-10-CM

## 2024-06-22 NOTE — BH Specialist Note (Signed)
 Integrated Behavioral Health Initial In-Person Visit  MRN: [id number] Name: OMARIAN JAQUITH  Number of Integrated Behavioral Health Clinician visits: 1- Initial Visit  Session Start time: 1348    Session End time: 1425  Total time in minutes: 37    Types of Service: Introduction only  Interpretor:No. Interpretor Name and Language: N/A   Subjective: OTHAL KUBITZ is a 53 y.o. male Patient was referred by PCP for Counseling. Patient reports the following symptoms/concerns: The Licensed Clinical Engineer, building services (LCSW-A), acting as a Visual merchandiser [facility]), initiated a session with patient. The [facility] introduced themselves, explained her role, and provided contact information to the patient. Confidentiality and mandated reporting were discussed, and the patient denied any suicidal ideations or intent to harm others.   Patient discussed fear of death. Patient discussed medical history and triggers. Patient reports isolating and not having any interest.  [facility] recommended visualization techniques for when he experience anxiety related to his medical history. [facility] recommended patient find or implement things he enjoys. Patient unsure of availability and will contact [facility] to schedule next session. .  Duration of problem: Longer than a year; Severity of problem: mild  Objective: Mood: Anxious and Affect: Appropriate Risk of harm to self or others: No plan to harm self or others  Clinical Assessment/Diagnosis  Anxiety about health   Assessment: Patient currently experiencing Anxiety regarding Health.   Patient may benefit from CBT .  Plan: Follow up with behavioral health clinician on : Patient will contact office to schedule.   Renda Pontes, MSW, LCSW-A She/Her Behavioral Health Clinician [facility]

## 2024-06-27 DIAGNOSIS — I5022 Chronic systolic (congestive) heart failure: Secondary | ICD-10-CM | POA: Diagnosis not present

## 2024-06-27 DIAGNOSIS — Z7689 Persons encountering health services in other specified circumstances: Secondary | ICD-10-CM | POA: Diagnosis not present

## 2024-06-27 DIAGNOSIS — I480 Paroxysmal atrial fibrillation: Secondary | ICD-10-CM | POA: Diagnosis not present

## 2024-06-27 DIAGNOSIS — Z87898 Personal history of other specified conditions: Secondary | ICD-10-CM | POA: Diagnosis not present

## 2024-06-27 DIAGNOSIS — R4589 Other symptoms and signs involving emotional state: Secondary | ICD-10-CM | POA: Diagnosis not present

## 2024-06-30 DIAGNOSIS — Z419 Encounter for procedure for purposes other than remedying health state, unspecified: Secondary | ICD-10-CM | POA: Diagnosis not present

## 2024-07-06 ENCOUNTER — Ambulatory Visit: Attending: Thoracic Surgery (Cardiothoracic Vascular Surgery) | Admitting: *Deleted

## 2024-07-06 DIAGNOSIS — Z5181 Encounter for therapeutic drug level monitoring: Secondary | ICD-10-CM | POA: Diagnosis not present

## 2024-07-06 DIAGNOSIS — I480 Paroxysmal atrial fibrillation: Secondary | ICD-10-CM | POA: Diagnosis not present

## 2024-07-06 DIAGNOSIS — Z952 Presence of prosthetic heart valve: Secondary | ICD-10-CM | POA: Insufficient documentation

## 2024-07-06 LAB — POCT INR: POC INR: 2.7

## 2024-07-06 NOTE — Patient Instructions (Signed)
 Description   Continue same dosage of Warfarin 3 tablets daily, except 4 tablets every Wednesdays.    Recheck INR in 3 weeks  Stay consistent with greens each week-2 times per week.  Coumadin  Clinic [phone]

## 2024-07-06 NOTE — Progress Notes (Signed)
 INR 2.7 Please see anticoagulation encounter  Lab Results  Component Value Date   INR 2.7 07/06/2024   INR 2.2 06/21/2024   INR 1.7 (A) 06/14/2024    Description   Continue same dosage of Warfarin 3 tablets daily, except 4 tablets every Wednesdays.    Recheck INR in 3 weeks  Stay consistent with greens each week-2 times per week.  Coumadin  Clinic [phone]

## 2024-07-12 ENCOUNTER — Encounter: Payer: Self-pay | Admitting: Urology

## 2024-07-12 ENCOUNTER — Ambulatory Visit (INDEPENDENT_AMBULATORY_CARE_PROVIDER_SITE_OTHER): Admitting: Urology

## 2024-07-12 VITALS — BP 125/82 | HR 80 | Ht 72.0 in | Wt 238.0 lb

## 2024-07-12 DIAGNOSIS — C61 Malignant neoplasm of prostate: Secondary | ICD-10-CM | POA: Insufficient documentation

## 2024-07-12 DIAGNOSIS — N401 Enlarged prostate with lower urinary tract symptoms: Secondary | ICD-10-CM

## 2024-07-12 DIAGNOSIS — N529 Male erectile dysfunction, unspecified: Secondary | ICD-10-CM | POA: Diagnosis not present

## 2024-07-12 DIAGNOSIS — N138 Other obstructive and reflux uropathy: Secondary | ICD-10-CM | POA: Diagnosis not present

## 2024-07-12 LAB — MICROSCOPIC EXAMINATION

## 2024-07-12 LAB — URINALYSIS, ROUTINE W REFLEX MICROSCOPIC
Bilirubin, UA: NEGATIVE
Glucose, UA: NEGATIVE
Ketones, UA: NEGATIVE
Leukocytes,UA: NEGATIVE
Nitrite, UA: NEGATIVE
Protein,UA: NEGATIVE
Specific Gravity, UA: >=1.03 — AB (ref 1.005–1.030)
Urobilinogen, Ur: 0.2 mg/dL (ref 0.2–1.0)
pH, UA: 5.5 (ref 5.0–7.5)
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
## 2024-07-13 DIAGNOSIS — Z1159 Encounter for screening for other viral diseases: Secondary | ICD-10-CM | POA: Diagnosis not present

## 2024-07-13 DIAGNOSIS — Z2821 Immunization not carried out because of patient refusal: Secondary | ICD-10-CM | POA: Diagnosis not present

## 2024-07-13 DIAGNOSIS — Z114 Encounter for screening for human immunodeficiency virus [HIV]: Secondary | ICD-10-CM | POA: Diagnosis not present

## 2024-07-13 DIAGNOSIS — Z1329 Encounter for screening for other suspected endocrine disorder: Secondary | ICD-10-CM | POA: Diagnosis not present

## 2024-07-13 DIAGNOSIS — Z131 Encounter for screening for diabetes mellitus: Secondary | ICD-10-CM | POA: Diagnosis not present

## 2024-07-13 DIAGNOSIS — Z0000 Encounter for general adult medical examination without abnormal findings: Secondary | ICD-10-CM | POA: Diagnosis not present

## 2024-07-13 DIAGNOSIS — Z13220 Encounter for screening for lipoid disorders: Secondary | ICD-10-CM | POA: Diagnosis not present

## 2024-07-17 ENCOUNTER — Other Ambulatory Visit: Payer: Self-pay | Admitting: Student

## 2024-07-17 ENCOUNTER — Other Ambulatory Visit (HOSPITAL_COMMUNITY): Payer: Self-pay

## 2024-07-17 DIAGNOSIS — I480 Paroxysmal atrial fibrillation: Secondary | ICD-10-CM

## 2024-07-17 DIAGNOSIS — Z13220 Encounter for screening for lipoid disorders: Secondary | ICD-10-CM | POA: Diagnosis not present

## 2024-07-17 MED ORDER — WARFARIN SODIUM 2.5 MG PO TABS
7.5000 mg | ORAL_TABLET | Freq: Every day | ORAL | 0 refills | Status: DC
  Filled 2024-07-17: qty 270, 90d supply, fill #0

## 2024-07-17 NOTE — Telephone Encounter (Signed)
 He is still our patient.

## 2024-07-18 ENCOUNTER — Other Ambulatory Visit (HOSPITAL_COMMUNITY): Payer: Self-pay

## 2024-07-27 ENCOUNTER — Ambulatory Visit: Attending: Thoracic Surgery (Cardiothoracic Vascular Surgery)

## 2024-07-27 DIAGNOSIS — Z5181 Encounter for therapeutic drug level monitoring: Secondary | ICD-10-CM | POA: Diagnosis not present

## 2024-07-27 DIAGNOSIS — I480 Paroxysmal atrial fibrillation: Secondary | ICD-10-CM | POA: Diagnosis not present

## 2024-07-27 DIAGNOSIS — Z952 Presence of prosthetic heart valve: Secondary | ICD-10-CM | POA: Insufficient documentation

## 2024-07-27 LAB — POCT INR: INR: 2.4 (ref 2.0–3.0)

## 2024-07-27 NOTE — Patient Instructions (Signed)
 Take 3.5 tablets today only then Continue same dosage of Warfarin 3 tablets daily, except 4 tablets every Wednesdays.    Recheck INR in 5 weeks  Stay consistent with greens each week-2 times per week.  Coumadin  Clinic [phone]

## 2024-07-27 NOTE — Progress Notes (Signed)
 INR 2.4 Please see anticoagulation encounter Take 3.5 tablets today only then Continue same dosage of Warfarin 3 tablets daily, except 4 tablets every Wednesdays.    Recheck INR in 5 weeks  Stay consistent with greens each week-2 times per week.  Coumadin  Clinic [phone]

## 2024-08-21 ENCOUNTER — Encounter: Payer: Self-pay | Admitting: Radiology

## 2024-08-25 ENCOUNTER — Encounter: Payer: Self-pay | Admitting: *Deleted

## 2024-08-28 ENCOUNTER — Other Ambulatory Visit (HOSPITAL_COMMUNITY): Payer: Self-pay

## 2024-08-29 ENCOUNTER — Encounter: Payer: Self-pay | Admitting: Cardiology

## 2024-08-29 ENCOUNTER — Ambulatory Visit: Admitting: *Deleted

## 2024-08-29 ENCOUNTER — Ambulatory Visit: Attending: Cardiology | Admitting: Cardiology

## 2024-08-29 ENCOUNTER — Other Ambulatory Visit (HOSPITAL_COMMUNITY): Payer: Self-pay

## 2024-08-29 VITALS — BP 126/80 | HR 68 | Ht 72.0 in | Wt 250.8 lb

## 2024-08-29 DIAGNOSIS — N183 Chronic kidney disease, stage 3 unspecified: Secondary | ICD-10-CM | POA: Insufficient documentation

## 2024-08-29 DIAGNOSIS — G4733 Obstructive sleep apnea (adult) (pediatric): Secondary | ICD-10-CM | POA: Diagnosis not present

## 2024-08-29 DIAGNOSIS — Z8679 Personal history of other diseases of the circulatory system: Secondary | ICD-10-CM | POA: Diagnosis not present

## 2024-08-29 DIAGNOSIS — I480 Paroxysmal atrial fibrillation: Secondary | ICD-10-CM

## 2024-08-29 DIAGNOSIS — Z952 Presence of prosthetic heart valve: Secondary | ICD-10-CM

## 2024-08-29 DIAGNOSIS — Z9889 Other specified postprocedural states: Secondary | ICD-10-CM | POA: Insufficient documentation

## 2024-08-29 DIAGNOSIS — Z79899 Other long term (current) drug therapy: Secondary | ICD-10-CM | POA: Diagnosis not present

## 2024-08-29 LAB — POCT INR: INR: 2.7 (ref 2.0–3.0)

## 2024-08-29 MED ORDER — POTASSIUM CHLORIDE CRYS ER 10 MEQ PO TBCR
10.0000 meq | EXTENDED_RELEASE_TABLET | Freq: Every day | ORAL | 3 refills | Status: AC | PRN
Start: 2024-08-29 — End: ?
  Filled 2024-08-29: qty 25, 25d supply, fill #0

## 2024-08-29 MED ORDER — FUROSEMIDE 20 MG PO TABS
20.0000 mg | ORAL_TABLET | Freq: Every day | ORAL | 3 refills | Status: AC | PRN
  Filled 2024-08-29: qty 25, 25d supply, fill #0

## 2024-08-29 NOTE — Patient Instructions (Signed)
 Medication Instructions:  Your physician has recommended you make the following change in your medication:  As needed:  Lasix  20 mg as needed for weight gain greater than 5 pounds in 2 days.  As needed: Potassium 10 mEq when you take Lasix   *If you need a refill on your cardiac medications before your next appointment, please call your pharmacy*  Lab Work: CMET, Mag If you have labs (blood work) drawn today and your tests are completely normal, you will receive your results only by: MyChart Message (if you have MyChart) OR A paper copy in the mail If you have any lab test that is abnormal or we need to change your treatment, we will call you to review the results.  Follow-Up: At [facility], you and your health needs are our priority.  As part of our continuing mission to provide you with exceptional heart care, our providers are all part of one team.  This team includes your primary Cardiologist (physician) and Advanced Practice Providers or APPs (Physician Assistants and Nurse Practitioners) who all work together to provide you with the care you need, when you need it.  Your next appointment:   9 month(s)  Provider:   Kardie Tobb, DO

## 2024-08-29 NOTE — Patient Instructions (Signed)
 Description   INR-2.7; Continue taking Warfarin 3 tablets daily, except 4 tablets every Wednesdays.    Recheck INR in 6 weeks.  Stay consistent with greens each week-2 times per week.  Coumadin  Clinic [phone]
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
## 2024-08-29 NOTE — Progress Notes (Signed)
 Description   INR-2.7; Continue taking Warfarin 3 tablets daily, except 4 tablets every Wednesdays.    Recheck INR in 6 weeks.  Stay consistent with greens each week-2 times per week.  Coumadin  Clinic [phone]

## 2024-08-30 LAB — COMPREHENSIVE METABOLIC PANEL WITH GFR
ALT: 21 IU/L (ref 0–44)
AST: 18 IU/L (ref 0–40)
Albumin: 4.1 g/dL (ref 3.8–4.9)
Alkaline Phosphatase: 62 IU/L (ref 47–123)
BUN/Creatinine Ratio: 13 (ref 9–20)
BUN: 16 mg/dL (ref 6–24)
Bilirubin Total: 0.5 mg/dL (ref 0.0–1.2)
CO2: 24 mmol/L (ref 20–29)
Calcium: 9.1 mg/dL (ref 8.7–10.2)
Chloride: 104 mmol/L (ref 96–106)
Creatinine, Ser: 1.28 mg/dL — ABNORMAL HIGH (ref 0.76–1.27)
Globulin, Total: 2.5 g/dL (ref 1.5–4.5)
Glucose: 85 mg/dL (ref 70–99)
Potassium: 4.5 mmol/L (ref 3.5–5.2)
Sodium: 139 mmol/L (ref 134–144)
Total Protein: 6.6 g/dL (ref 6.0–8.5)
eGFR: 67 mL/min/1.73 (ref >59)

## 2024-08-30 LAB — MAGNESIUM: Magnesium: 1.8 mg/dL (ref 1.6–2.3)

## 2024-09-06 ENCOUNTER — Ambulatory Visit: Admitting: Student

## 2024-09-06 ENCOUNTER — Ambulatory Visit: Payer: Self-pay | Admitting: Cardiology

## 2024-09-29 ENCOUNTER — Other Ambulatory Visit: Payer: Self-pay

## 2024-09-29 ENCOUNTER — Encounter (HOSPITAL_BASED_OUTPATIENT_CLINIC_OR_DEPARTMENT_OTHER): Payer: Self-pay | Admitting: Emergency Medicine

## 2024-09-29 DIAGNOSIS — Z7982 Long term (current) use of aspirin: Secondary | ICD-10-CM | POA: Diagnosis not present

## 2024-09-29 DIAGNOSIS — M76892 Other specified enthesopathies of left lower limb, excluding foot: Secondary | ICD-10-CM | POA: Diagnosis not present

## 2024-09-29 DIAGNOSIS — Z7901 Long term (current) use of anticoagulants: Secondary | ICD-10-CM | POA: Diagnosis not present

## 2024-09-29 DIAGNOSIS — I4891 Unspecified atrial fibrillation: Secondary | ICD-10-CM | POA: Diagnosis not present

## 2024-09-29 DIAGNOSIS — Z79899 Other long term (current) drug therapy: Secondary | ICD-10-CM | POA: Diagnosis not present

## 2024-09-29 NOTE — ED Triage Notes (Signed)
 Pt with ongoing worsening left groin pain all day today.  Has gotten worse and cannot step up onto a step.  No urinary symptoms. No lower back pain.

## 2024-09-30 ENCOUNTER — Emergency Department (HOSPITAL_BASED_OUTPATIENT_CLINIC_OR_DEPARTMENT_OTHER)
Admission: EM | Admit: 2024-09-30 | Discharge: 2024-09-30 | Disposition: A | Attending: Emergency Medicine | Admitting: Emergency Medicine

## 2024-09-30 DIAGNOSIS — M76892 Other specified enthesopathies of left lower limb, excluding foot: Secondary | ICD-10-CM | POA: Insufficient documentation

## 2024-09-30 LAB — CBC WITH DIFFERENTIAL/PLATELET
Abs Immature Granulocytes: 0.01 K/uL (ref 0.00–0.07)
Basophils Absolute: 0 K/uL (ref 0.0–0.1)
Basophils Relative: 1 %
Eosinophils Absolute: 0.1 K/uL (ref 0.0–0.5)
Eosinophils Relative: 2 %
HCT: 42.8 % (ref 39.0–52.0)
Hemoglobin: 14.6 g/dL (ref 13.0–17.0)
Immature Granulocytes: 0 %
Lymphocytes Relative: 26 %
Lymphs Abs: 1.4 K/uL (ref 0.7–4.0)
MCH: 31.1 pg (ref 26.0–34.0)
MCHC: 34.1 g/dL (ref 30.0–36.0)
MCV: 91.1 fL (ref 80.0–100.0)
Monocytes Absolute: 0.7 K/uL (ref 0.1–1.0)
Monocytes Relative: 14 %
Neutro Abs: 3.1 K/uL (ref 1.7–7.7)
Neutrophils Relative %: 57 %
Platelets: 288 K/uL (ref 150–400)
RBC: 4.7 MIL/uL (ref 4.22–5.81)
RDW: 14.4 % (ref 11.5–15.5)
WBC: 5.3 K/uL (ref 4.0–10.5)
nRBC: 0 % (ref 0.0–0.2)

## 2024-09-30 LAB — BASIC METABOLIC PANEL WITH GFR
Anion gap: 10 (ref 5–15)
BUN: 20 mg/dL (ref 6–20)
CO2: 24 mmol/L (ref 22–32)
Calcium: 9.2 mg/dL (ref 8.9–10.3)
Chloride: 105 mmol/L (ref 98–111)
Creatinine, Ser: 1.16 mg/dL (ref 0.61–1.24)
GFR, Estimated: >60 mL/min (ref >60)
Glucose, Bld: 95 mg/dL (ref 70–99)
Potassium: 4 mmol/L (ref 3.5–5.1)
Sodium: 139 mmol/L (ref 135–145)

## 2024-09-30 LAB — URINALYSIS, ROUTINE W REFLEX MICROSCOPIC
Bilirubin Urine: NEGATIVE
Glucose, UA: NEGATIVE mg/dL
Ketones, ur: NEGATIVE mg/dL
Leukocytes,Ua: NEGATIVE
Nitrite: NEGATIVE
Protein, ur: NEGATIVE mg/dL
Specific Gravity, Urine: >=1.03 (ref 1.005–1.030)
pH: 6 (ref 5.0–8.0)

## 2024-09-30 LAB — URINALYSIS, MICROSCOPIC (REFLEX)

## 2024-09-30 LAB — PROTIME-INR
INR: 1.9 — ABNORMAL HIGH (ref 0.8–1.2)
Prothrombin Time: 23 s — ABNORMAL HIGH (ref 11.4–15.2)

## 2024-09-30 MED ORDER — HYDROCODONE-ACETAMINOPHEN 5-325 MG PO TABS
1.0000 | ORAL_TABLET | Freq: Once | ORAL | Status: AC
  Administered 2024-09-30: 1 via ORAL
  Filled 2024-09-30: qty 1

## 2024-09-30 MED ORDER — HYDROCODONE-ACETAMINOPHEN 5-325 MG PO TABS: 1.0000 | ORAL_TABLET | Freq: Four times a day (QID) | ORAL | 0 refills | Status: AC | PRN

## 2024-09-30 NOTE — ED Provider Notes (Signed)
  EMERGENCY DEPARTMENT AT [facility]  Provider Note  CSN: [id number] Arrival date & time: 09/29/24 2355  History Chief Complaint  Patient presents with   Groin Pain    Timothy Browning is a 53 y.o. male with history of mechanical valve, afib on coumadin , prior kidney stones, reports one day of L groin pain, worse with lifting leg/bending at hip. No falls or injuries. No hematuria or flank pain. He is specifically worried about blood clot, but no swelling and pain is only present with movement.    Home Medications Prior to Admission medications  Medication Sig Start Date End Date Taking? Authorizing Provider  HYDROcodone -acetaminophen  (NORCO/VICODIN) 5-325 MG tablet Take 1 tablet by mouth every 6 (six) hours as needed for severe pain (pain score 7-10). 09/30/24  Yes Roselyn Carlin NOVAK, MD  acetaminophen  (TYLENOL ) 500 MG tablet Take 1,000 mg by mouth every 6 (six) hours as needed for moderate pain (pain score 4-6).    [provider]  aspirin  EC 81 MG tablet Take 1 tablet (81 mg total) by mouth daily. Swallow whole. 05/14/23   Roddenberry, Myron G, PA-C  cyclobenzaprine  (FLEXERIL ) 5 MG tablet Take 1 tablet (5 mg total) by mouth 3 (three) times daily as needed for muscle spasms. 03/28/24   Norrine Sharper, MD  docusate sodium  (COLACE) 100 MG capsule Take 100 mg by mouth as needed.    [provider]  enoxaparin  (LOVENOX ) 150 MG/ML injection Inject 1 mL (150 mg total) into the skin daily. Patient not taking: Reported on 08/29/2024 06/14/24   Tobb, Kardie, DO  furosemide  (LASIX ) 20 MG tablet Take 1 tablet (20 mg total) by mouth daily as needed (weight gain 5 pounds in 2 days). 08/29/24   Tobb, Kardie, DO  hydrOXYzine  (ATARAX ) 50 MG tablet Take 1 tablet (50 mg total) by mouth 3 (three) times daily as needed for anxiety. 03/28/24   Norrine Sharper, MD  meclizine  (ANTIVERT ) 25 MG tablet Take 1 tablet (25 mg total) by mouth 3 (three) times daily as needed for  dizziness. 09/28/23   Flint Sonny POUR, PA-C  metoprolol  succinate (TOPROL  XL) 25 MG 24 hr tablet Take 0.5 tablets (12.5 mg total) by mouth at bedtime. Patient not taking: Reported on 08/29/2024 05/23/24   Tobb, Kardie, DO  metoprolol  succinate (TOPROL -XL) 50 MG 24 hr tablet Take 1 tablet (50 mg total) by mouth daily. Take with or immediately following a meal. 02/28/24   Rana Dixon L, NP  phenazopyridine  (PYRIDIUM ) 200 MG tablet Take 1 tablet (200 mg total) by mouth 3 (three) times daily as needed for pain. Patient not taking: Reported on 08/29/2024 01/04/24 01/03/25  Roseann Adine PARAS., MD  potassium chloride  (KLOR-CON  M) 10 MEQ tablet Take 1 tablet (10 mEq total) by mouth daily as needed (weight gain 5 pounds in 2 days). 08/29/24   Tobb, Kardie, DO  rosuvastatin  (CRESTOR ) 5 MG tablet Take 1 tablet (5 mg total) by mouth daily. 11/01/23   McLendon, Michael, MD  sildenafil (VIAGRA) 100 MG tablet Take 100 mg by mouth daily as needed for erectile dysfunction.    [provider]  warfarin (COUMADIN ) 2.5 MG tablet Take 3 to 4 tablets (7.5-10 mg total) by mouth daily as directed by coumadin  clinic. 07/17/24   Norrine Sharper, MD     Allergies    Ibuprofen    Review of Systems   Review of Systems Please see HPI for pertinent positives and negatives  Physical Exam BP (!) 137/92 (BP Location: Right  Arm)   Pulse 77   Temp 98.6 F (37 C) (Oral)   Resp 20   SpO2 97%   Physical Exam Vitals and nursing note reviewed.  Constitutional:      Appearance: Normal appearance.  HENT:     Head: Normocephalic and atraumatic.     Nose: Nose normal.     Mouth/Throat:     Mouth: Mucous membranes are moist.  Eyes:     Extraocular Movements: Extraocular movements intact.     Conjunctiva/sclera: Conjunctivae normal.  Cardiovascular:     Rate and Rhythm: Normal rate.     Comments: Mechanical click Pulmonary:     Effort: Pulmonary effort is normal.     Breath sounds: Normal breath sounds.   Abdominal:     General: Abdomen is flat.     Palpations: Abdomen is soft.     Tenderness: There is no abdominal tenderness.  Musculoskeletal:        General: No swelling or tenderness. Normal range of motion.     Cervical back: Neck supple.     Comments: Pain in L hip flexor with flexion of hip, no focal tenderness  Skin:    General: Skin is warm and dry.  Neurological:     General: No focal deficit present.     Mental Status: He is alert.  Psychiatric:        Mood and Affect: Mood normal.     ED Results / Procedures / Treatments   EKG None  Procedures Procedures  Medications Ordered in the ED Medications  HYDROcodone -acetaminophen  (NORCO/VICODIN) 5-325 MG per tablet 1 tablet (has no administration in time range)    Initial Impression and Plan  Patient here for MSK L groin pain, he is specifically worried about DVT which I feel is unlikely, however offered to check labs including INR to eval alternative cause. US  is not available at this time.   ED Course   Clinical Course as of 09/30/24 0220  Sat Sep 30, 2024  0102 CBC is normal.  [CS]  0113 INR borderline subtherapeutic [CS]  0129 BMP is normal.  [CS]  0216 UA is clear. Patient remains comfortable at rest. Plan discharge with Rx for norco as needed for pain, avoid NSAIDs due to coumadin  use. Recommend rest, heat and PCP follow up, RTED for any worsening pain, swelling, fever or other concerns.   [CS]    Clinical Course User Index [CS] Roselyn Carlin NOVAK, MD     MDM Rules/Calculators/A&P Medical Decision Making Problems Addressed: Hip flexor tendinitis, left: acute illness or injury  Amount and/or Complexity of Data Reviewed Labs: ordered. Decision-making details documented in ED Course.  Risk Prescription drug management.     Final Clinical Impression(s) / ED Diagnoses Final diagnoses:  Hip flexor tendinitis, left    Rx / DC Orders ED Discharge Orders          Ordered     HYDROcodone -acetaminophen  (NORCO/VICODIN) 5-325 MG tablet  Every 6 hours PRN        09/30/24 0220             Roselyn Carlin NOVAK, MD 09/30/24 [phone]

## 2024-10-04 DIAGNOSIS — M76892 Other specified enthesopathies of left lower limb, excluding foot: Secondary | ICD-10-CM | POA: Diagnosis not present

## 2024-10-04 DIAGNOSIS — M722 Plantar fascial fibromatosis: Secondary | ICD-10-CM | POA: Diagnosis not present

## 2024-10-09 ENCOUNTER — Other Ambulatory Visit (HOSPITAL_COMMUNITY): Payer: Self-pay

## 2024-10-09 ENCOUNTER — Other Ambulatory Visit: Payer: Self-pay | Admitting: Cardiology

## 2024-10-09 DIAGNOSIS — I480 Paroxysmal atrial fibrillation: Secondary | ICD-10-CM

## 2024-10-10 ENCOUNTER — Ambulatory Visit: Attending: Thoracic Surgery (Cardiothoracic Vascular Surgery)

## 2024-10-10 ENCOUNTER — Other Ambulatory Visit (HOSPITAL_COMMUNITY): Payer: Self-pay

## 2024-10-10 DIAGNOSIS — Z952 Presence of prosthetic heart valve: Secondary | ICD-10-CM | POA: Diagnosis not present

## 2024-10-10 DIAGNOSIS — I480 Paroxysmal atrial fibrillation: Secondary | ICD-10-CM | POA: Insufficient documentation

## 2024-10-10 LAB — POCT INR: INR: 2.2 (ref 2.0–3.0)

## 2024-10-10 MED ORDER — WARFARIN SODIUM 2.5 MG PO TABS
7.5000 mg | ORAL_TABLET | Freq: Every day | ORAL | 0 refills | Status: AC
  Filled 2024-10-10: qty 65, 16d supply, fill #0
  Filled 2024-10-10: qty 270, 90d supply, fill #0

## 2024-10-10 NOTE — Progress Notes (Signed)
 Description   INR-2.2; Take an extra tablet today (for a total of 4 tablets), then resume taking Warfarin 3 tablets daily, except 4 tablets every Wednesdays.    Recheck INR in 4 weeks.  Stay consistent with greens each week-2 times per week.  Coumadin  Clinic [phone]

## 2024-10-10 NOTE — Patient Instructions (Signed)
 Description   INR-2.2; Take an extra tablet today (for a total of 4 tablets), then resume taking Warfarin 3 tablets daily, except 4 tablets every Wednesdays.    Recheck INR in 4 weeks.  Stay consistent with greens each week-2 times per week.  Coumadin  Clinic [phone]

## 2024-10-11 ENCOUNTER — Other Ambulatory Visit (HOSPITAL_COMMUNITY): Payer: Self-pay

## 2024-11-07 ENCOUNTER — Ambulatory Visit: Attending: Thoracic Surgery (Cardiothoracic Vascular Surgery) | Admitting: *Deleted

## 2024-11-07 DIAGNOSIS — Z5181 Encounter for therapeutic drug level monitoring: Secondary | ICD-10-CM | POA: Insufficient documentation

## 2024-11-07 DIAGNOSIS — Z952 Presence of prosthetic heart valve: Secondary | ICD-10-CM | POA: Diagnosis present

## 2024-11-07 DIAGNOSIS — I480 Paroxysmal atrial fibrillation: Secondary | ICD-10-CM | POA: Insufficient documentation

## 2024-11-07 LAB — POCT INR: INR: 2.3 (ref 2.0–3.0)

## 2024-11-07 NOTE — Patient Instructions (Signed)
 Description   INR-2.3; Take an extra tablet today (for a total of 4 tablets), then START taking Warfarin 3 tablets daily, except 4 tablets every Sundays and Wednesdays.    Recheck INR in 3 weeks.  Stay consistent with greens each week-2 times per week.  Coumadin  Clinic [phone]

## 2024-11-07 NOTE — Progress Notes (Signed)
 Description   INR-2.3; Take an extra tablet today (for a total of 4 tablets), then START taking Warfarin 3 tablets daily, except 4 tablets every Sundays and Wednesdays.    Recheck INR in 3 weeks.  Stay consistent with greens each week-2 times per week.  Coumadin  Clinic [phone]

## 2024-11-28 ENCOUNTER — Ambulatory Visit

## 2024-12-12 ENCOUNTER — Ambulatory Visit: Admitting: Dermatology

## 2024-12-12 ENCOUNTER — Ambulatory Visit: Admitting: Urology

## 2025-06-27 ENCOUNTER — Ambulatory Visit: Admitting: Physician Assistant
